# Patient Record
Sex: Male | Born: 1955
Health system: Southern US, Community
[De-identification: ages and names within clinical notes are randomized; demographics above are authoritative.]

## PROBLEM LIST (undated history)

## (undated) DIAGNOSIS — R109 Unspecified abdominal pain: Secondary | ICD-10-CM

## (undated) DIAGNOSIS — I679 Cerebrovascular disease, unspecified: Secondary | ICD-10-CM

## (undated) DIAGNOSIS — T7840XA Allergy, unspecified, initial encounter: Secondary | ICD-10-CM

## (undated) DIAGNOSIS — I639 Cerebral infarction, unspecified: Secondary | ICD-10-CM

## (undated) DIAGNOSIS — D239 Other benign neoplasm of skin, unspecified: Secondary | ICD-10-CM

## (undated) DIAGNOSIS — K5790 Diverticulosis of intestine, part unspecified, without perforation or abscess without bleeding: Secondary | ICD-10-CM

## (undated) DIAGNOSIS — K5792 Diverticulitis of intestine, part unspecified, without perforation or abscess without bleeding: Secondary | ICD-10-CM

## (undated) DIAGNOSIS — K219 Gastro-esophageal reflux disease without esophagitis: Secondary | ICD-10-CM

## (undated) DIAGNOSIS — R079 Chest pain, unspecified: Secondary | ICD-10-CM

## (undated) DIAGNOSIS — I4891 Unspecified atrial fibrillation: Secondary | ICD-10-CM

## (undated) HISTORY — DX: Chest pain, unspecified: R07.9

## (undated) HISTORY — DX: Cerebrovascular disease, unspecified: I67.9

## (undated) HISTORY — DX: Gastro-esophageal reflux disease without esophagitis: K21.9

## (undated) HISTORY — PX: COLON SURGERY: SHX602

## (undated) HISTORY — DX: Diverticulitis of intestine, part unspecified, without perforation or abscess without bleeding: K57.92

## (undated) HISTORY — DX: Unspecified abdominal pain: R10.9

## (undated) HISTORY — DX: Other benign neoplasm of skin, unspecified: D23.9

## (undated) HISTORY — DX: Cerebral infarction, unspecified: I63.9

## (undated) HISTORY — DX: Allergy, unspecified, initial encounter: T78.40XA

## (undated) HISTORY — DX: Unspecified atrial fibrillation: I48.91

## (undated) HISTORY — DX: Diverticulosis of intestine, part unspecified, without perforation or abscess without bleeding: K57.90

---

## 1999-10-11 ENCOUNTER — Encounter: Payer: Self-pay | Admitting: *Deleted

## 1999-10-11 ENCOUNTER — Observation Stay (HOSPITAL_COMMUNITY): Admission: AD | Admit: 1999-10-11 | Discharge: 1999-10-12 | Payer: Self-pay | Admitting: *Deleted

## 2000-12-27 ENCOUNTER — Encounter: Admission: RE | Admit: 2000-12-27 | Discharge: 2000-12-27 | Payer: Self-pay | Admitting: *Deleted

## 2000-12-27 ENCOUNTER — Encounter: Payer: Self-pay | Admitting: *Deleted

## 2002-03-07 ENCOUNTER — Ambulatory Visit (HOSPITAL_COMMUNITY): Admission: RE | Admit: 2002-03-07 | Discharge: 2002-03-07 | Payer: Self-pay | Admitting: *Deleted

## 2006-01-26 ENCOUNTER — Encounter (INDEPENDENT_AMBULATORY_CARE_PROVIDER_SITE_OTHER): Payer: Self-pay | Admitting: Specialist

## 2006-01-26 ENCOUNTER — Ambulatory Visit (HOSPITAL_COMMUNITY): Admission: RE | Admit: 2006-01-26 | Discharge: 2006-01-26 | Payer: Self-pay | Admitting: Gastroenterology

## 2010-01-08 ENCOUNTER — Ambulatory Visit (HOSPITAL_COMMUNITY): Admission: RE | Admit: 2010-01-08 | Discharge: 2010-01-08 | Payer: Self-pay | Admitting: Emergency Medicine

## 2010-01-08 IMAGING — CT CT ABD-PELV W/ CM
2 of 5 series · 17 of 46 positions shown, 19 images · IV contrast (APPLIED)
Comparison: None.

Addendum Begins

The last sentence in the first paragraph of the findings section
has a a voice recognition error.  This sentence should read as
follows, "No abdominal aortic aneurysm."
The third paragraph in the findings section has a voice recognition
error in the third sentence.  This sentence should read "The
prostate gland is mildly enlarged."
Addendum Ends
CLINICAL DATA: Acute abdominal pain
CT ABDOMEN AND PELVIS WITH CONTRAST
TECHNIQUE: Multidetector CT imaging of the abdomen and pelvis was
performed following the standard protocol during bolus
administration of intravenous contrast.
Contrast: 125 ml [UJ]

[Series 2: abd_pel 5.0 b40f st · axial · 0.73mm/px · z∈[-499,-74]mm · 14 of 96 slices shown, 16 images]
[im 6/96  soft-tissue]
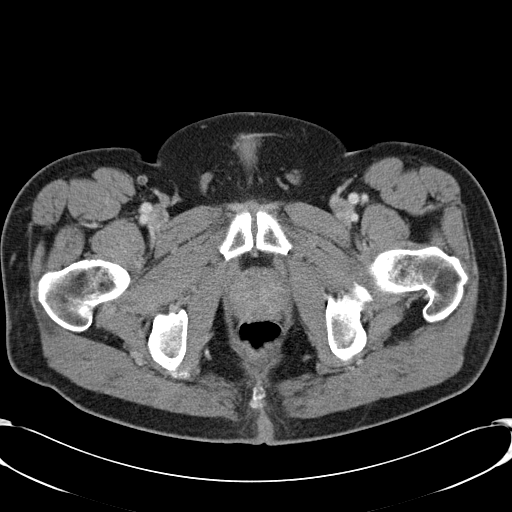
[im 6/96  bone]
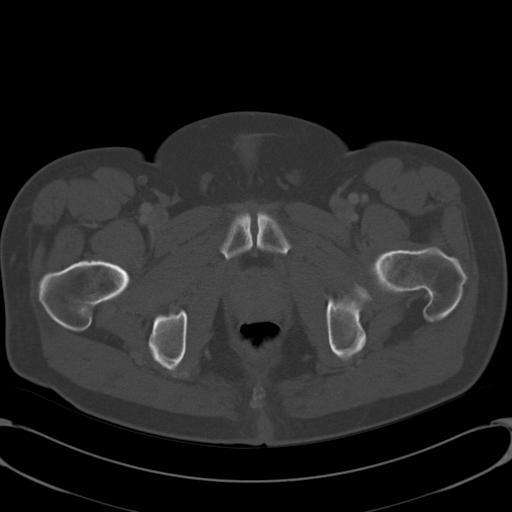
[im 11/96  soft-tissue]
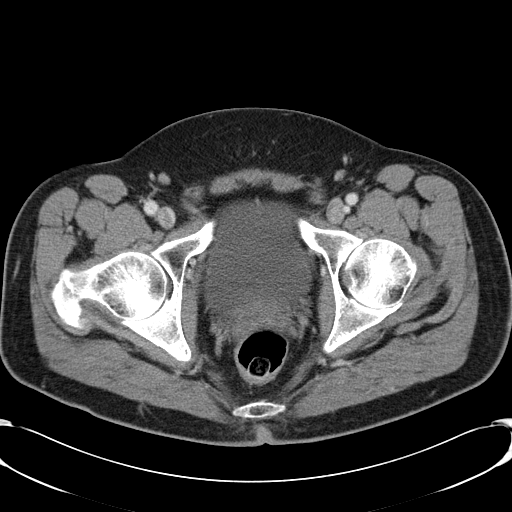
[im 21/96  soft-tissue]
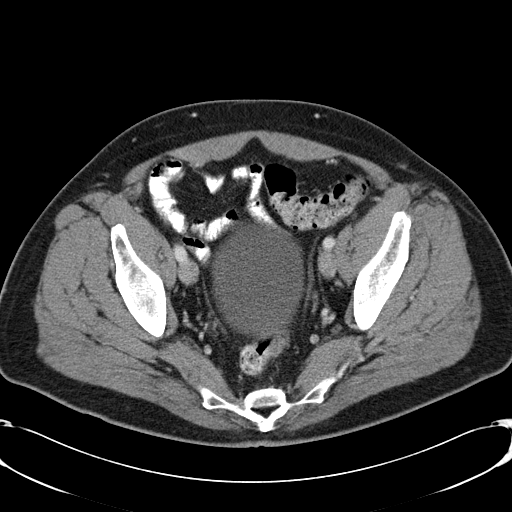
[im 26/96  soft-tissue]
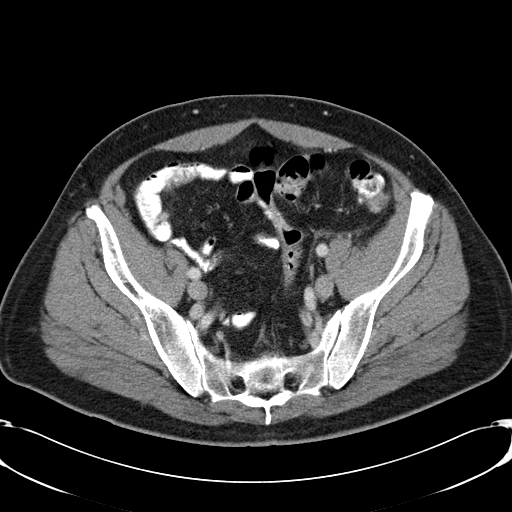
[im 31/96  soft-tissue]
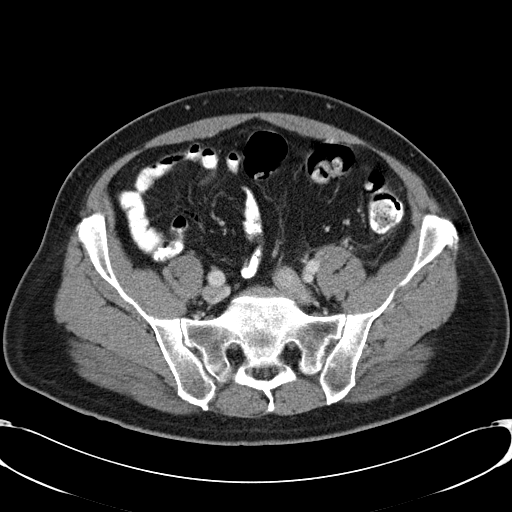
[im 41/96  soft-tissue]
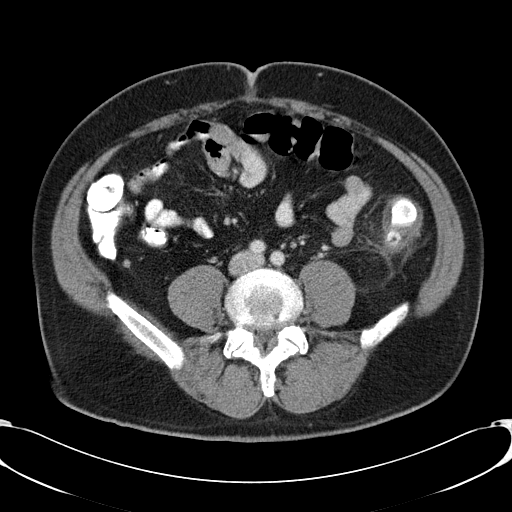
[im 46/96  soft-tissue]
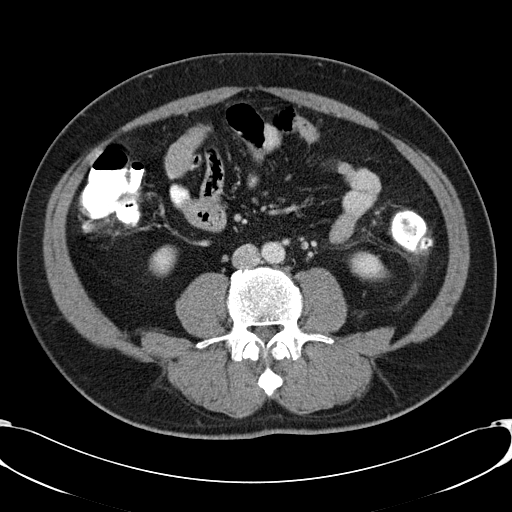
[im 51/96  soft-tissue]
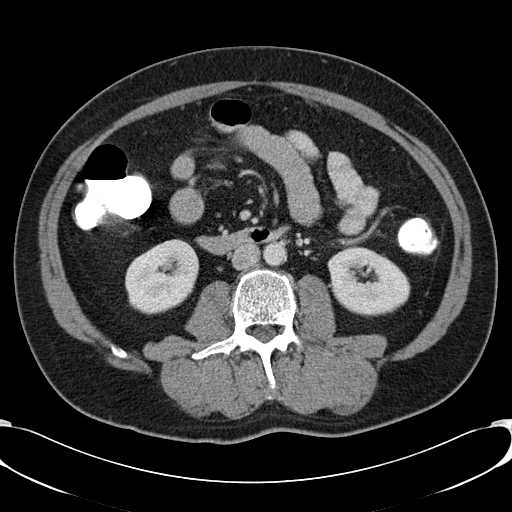
[im 56/96  soft-tissue]
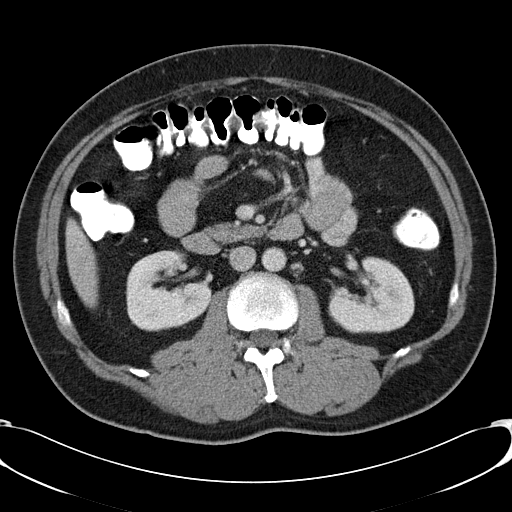
[im 56/96  bone]
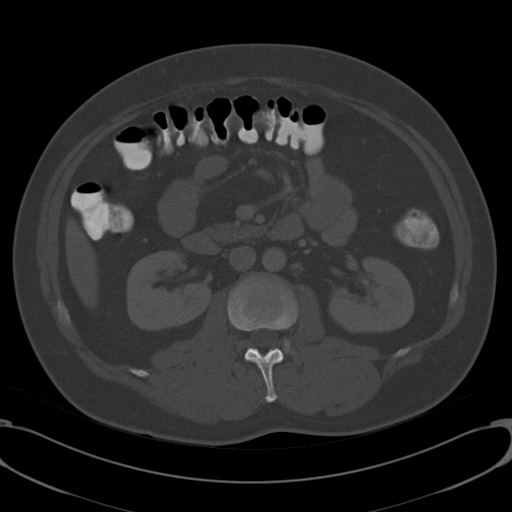
[im 66/96  soft-tissue]
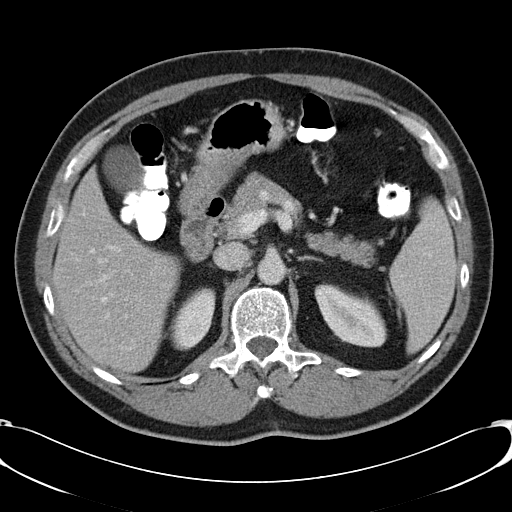
[im 71/96  soft-tissue]
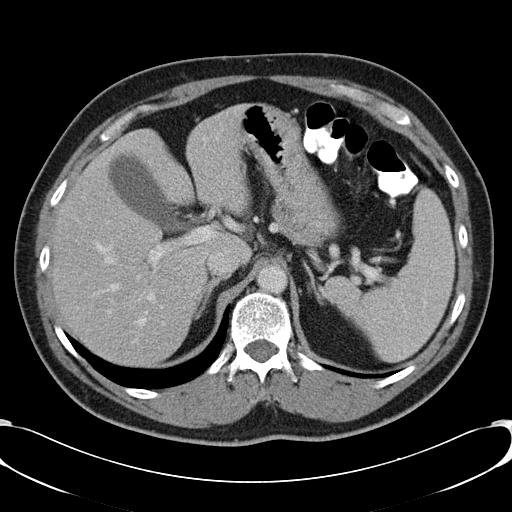
[im 76/96  soft-tissue]
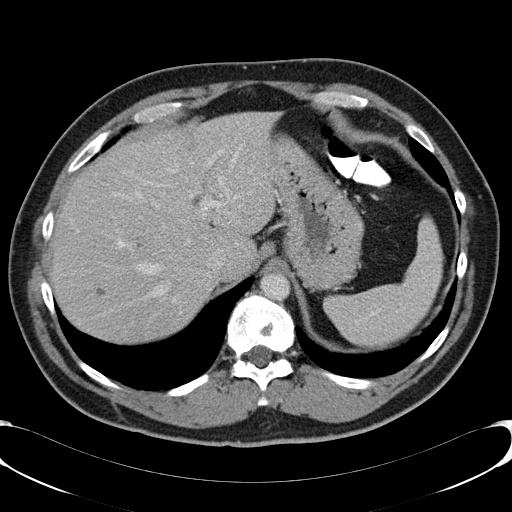
[im 86/96  soft-tissue]
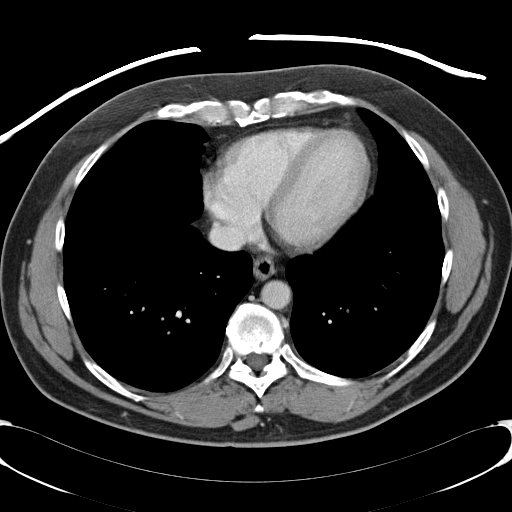
[im 91/96  soft-tissue]
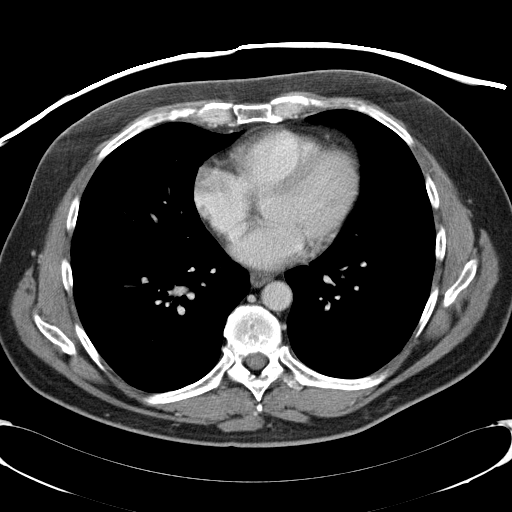

[Series 602: coronal · coronal · 0.96mm/px · 3 of 89 slices shown]
[im 30/89  soft-tissue]
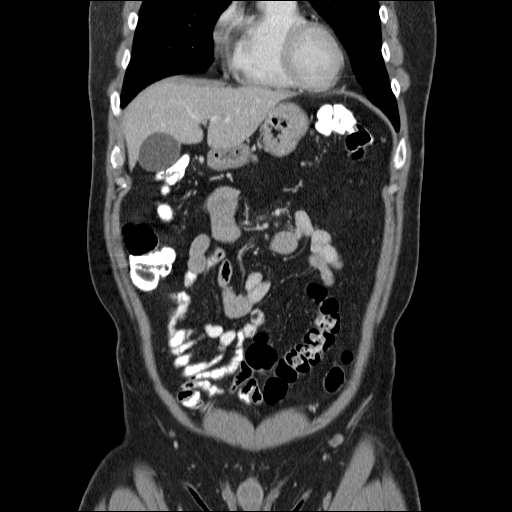
[im 40/89  soft-tissue]
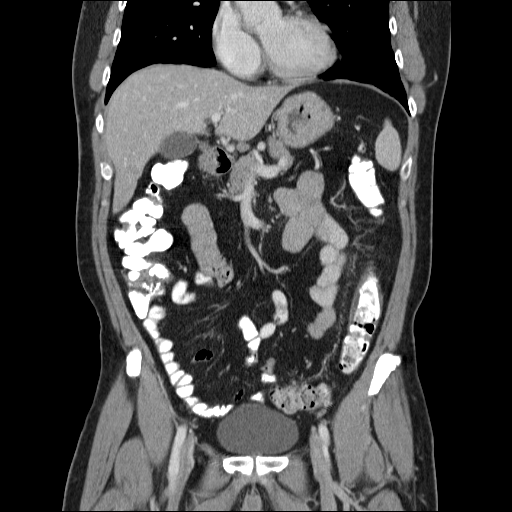
[im 49/89  soft-tissue]
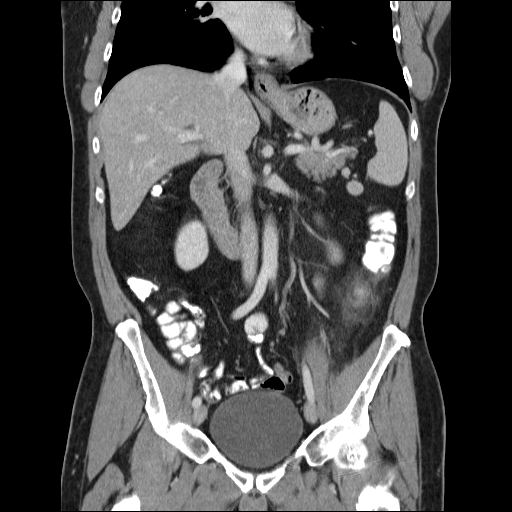

[17 of 46 positions shown; findings below may reference images not displayed]

FINDINGS: Tiny low density lesions in the liver are probably cysts.
Spleen is normal.  The patient has a tiny hiatal hernia.  Duodenum,
pancreas, gallbladder, adrenal glands, and kidneys have normal
imaging features.  The abdominal aortic aneurysm.

Just cranial to the left iliac crest, there is an area of around
the descending colon of pericolonic edema/inflammation associated
with a prominent diverticulum.  Imaging features are compatible
diverticulitis.  No or perforation or pericolonic abscess.

Bladder is distended.  No pelvic free fluid.  The gland is mildly
enlarged.  Scattered diverticuli are noted in the sigmoid colon.
The terminal ileum and the appendix are normal.

Bone windows reveal no worrisome lytic or sclerotic osseous
lesions.
IMPRESSION: Small area of pericolonic edema and asymmetric wall thickening
associated with the descending colon, just above the level of the
left iliac crest.  The features are compatible with diverticulitis.
No evidence for perforation or abscess.

Consider follow-up evaluation of the colon if the patient has not
had previous colorectal cancer screening as neoplasm can present
with similar imaging features.

## 2010-03-18 ENCOUNTER — Encounter: Admission: RE | Admit: 2010-03-18 | Discharge: 2010-03-18 | Payer: Self-pay | Admitting: Emergency Medicine

## 2010-03-18 IMAGING — CT CT ABD-PELV W/ CM
3 of 5 series · 12 of 36 positions shown, 18 images · IV contrast (READICAT/WATER & [ID] OMNI 300)
Comparison: [DATE]

CLINICAL DATA: Follow-up diverticulitis.  Left lower quadrant pain
with right abdominal soreness for 3 weeks.  Diverticulitis [REDACTED]
and [DATE] which improved with antibiotics.

CT ABDOMEN AND PELVIS WITH CONTRAST
TECHNIQUE: Multidetector CT imaging of the abdomen and pelvis was
performed following the standard protocol during bolus
administration of intravenous contrast.
Contrast: 125 ml Omnipaque 300% and oral contrast

[Series 3: routine abdomen · axial · 0.70mm/px · z∈[-353,-38]mm · 7 of 84 slices shown, 12 images]
[im 11/84  soft-tissue]
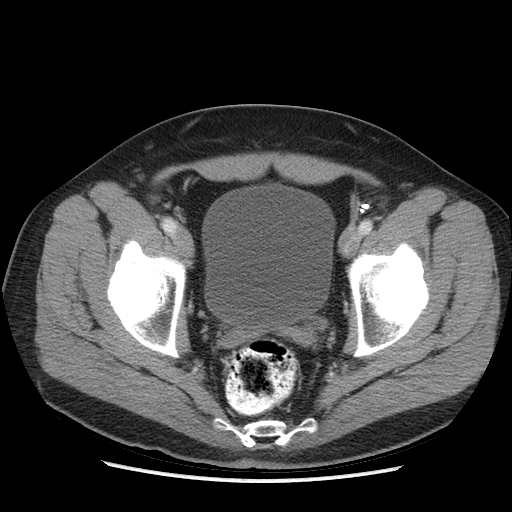
[im 11/84  bone]
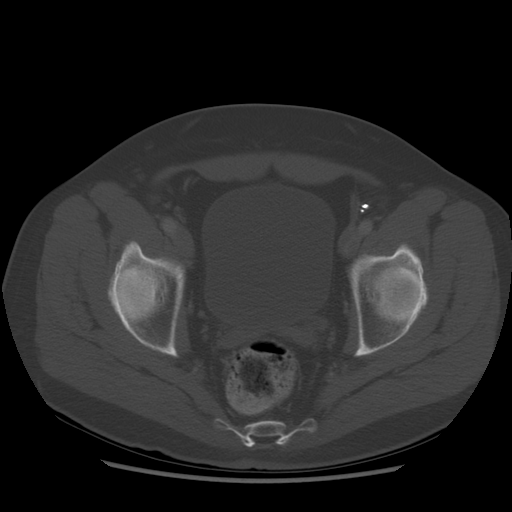
[im 21/84  soft-tissue]
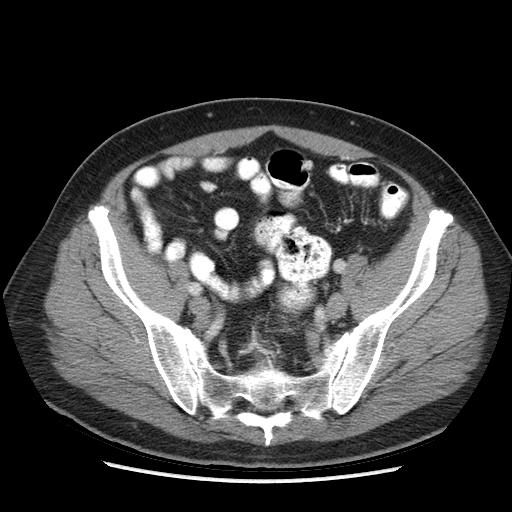
[im 32/84  soft-tissue]
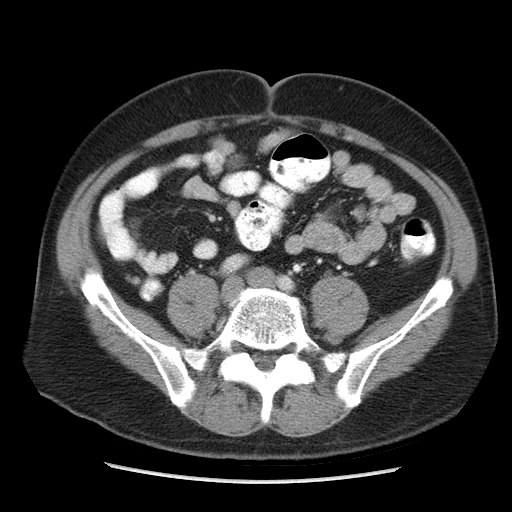
[im 42/84  soft-tissue]
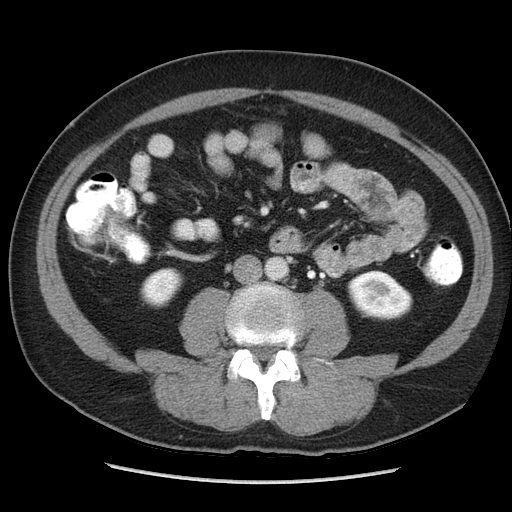
[im 42/84  lung]
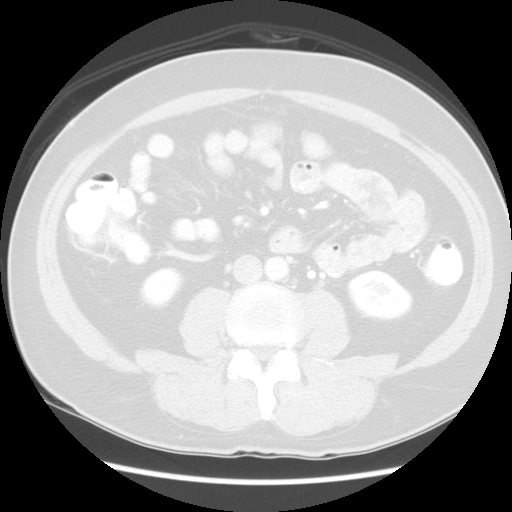
[im 52/84  soft-tissue]
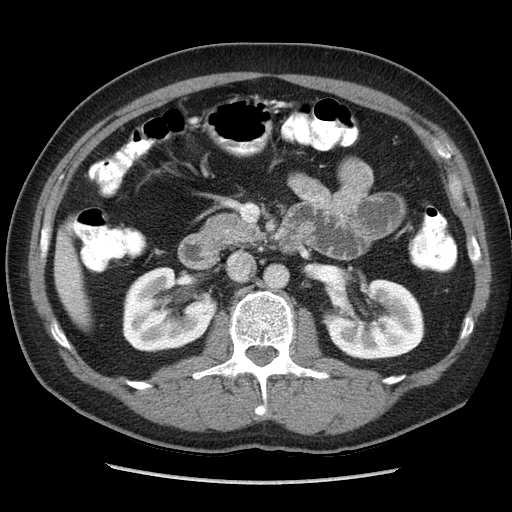
[im 52/84  lung]
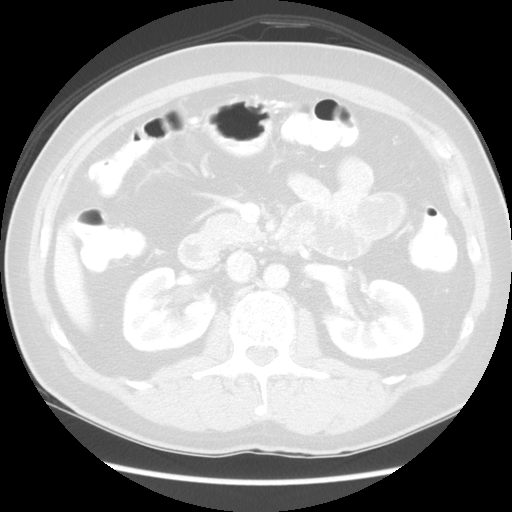
[im 63/84  soft-tissue]
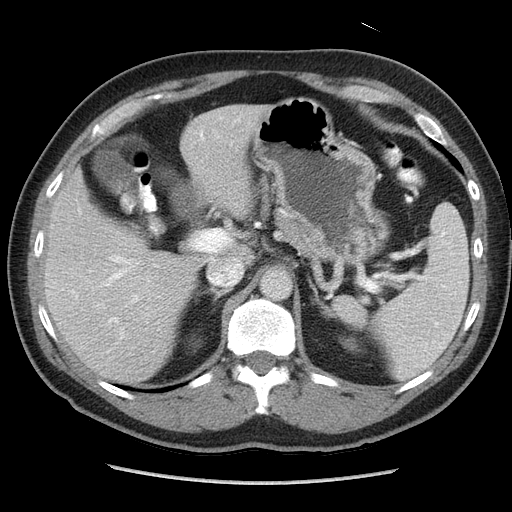
[im 63/84  lung]
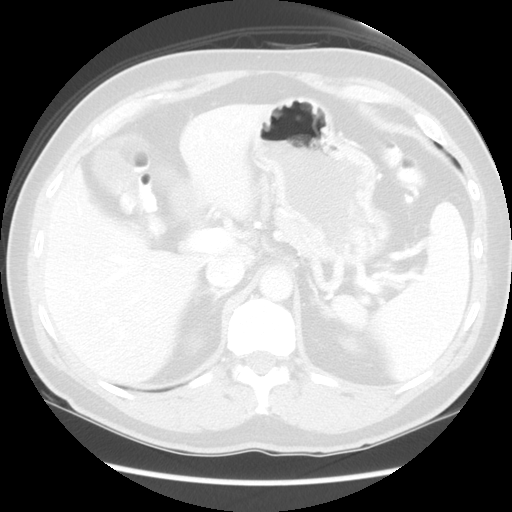
[im 73/84  soft-tissue]
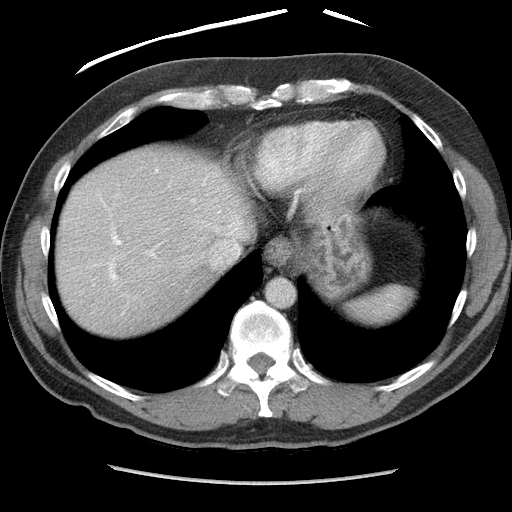
[im 73/84  lung]
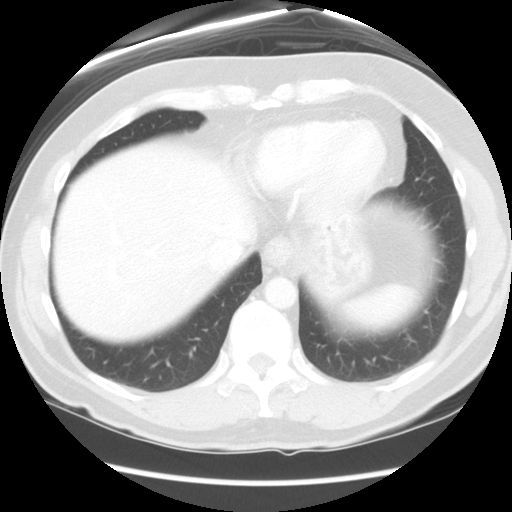

[Series 601: coronal body · coronal · 0.93mm/px · 1 of 123 slices shown, 2 images]
[im 41/123  soft-tissue]
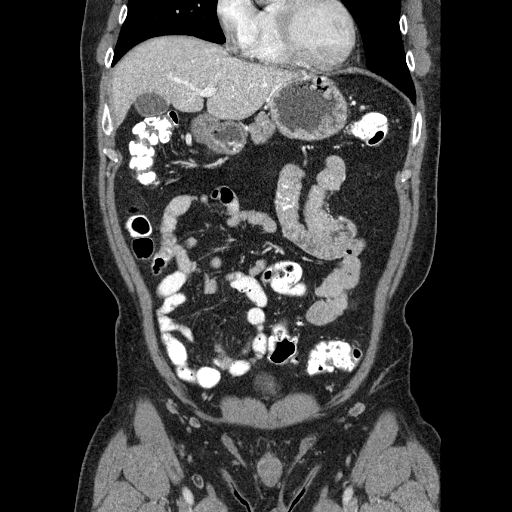
[im 41/123  bone]
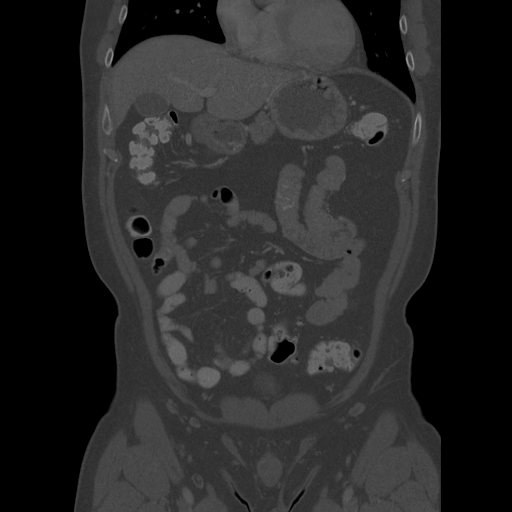

[Series 602: sagittal body · sagittal · 0.93mm/px · 4 of 145 slices shown]
[im 10/145  soft-tissue]
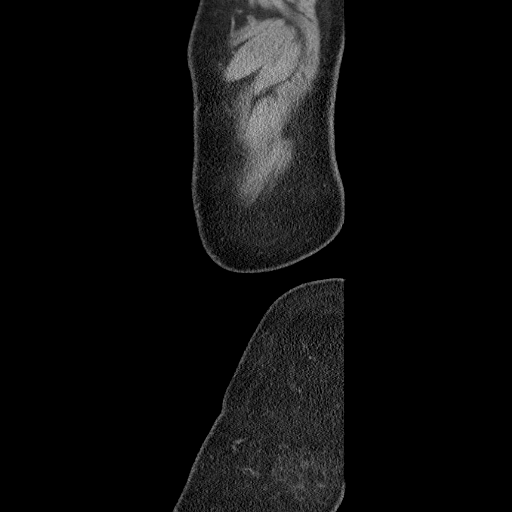
[im 29/145  soft-tissue]
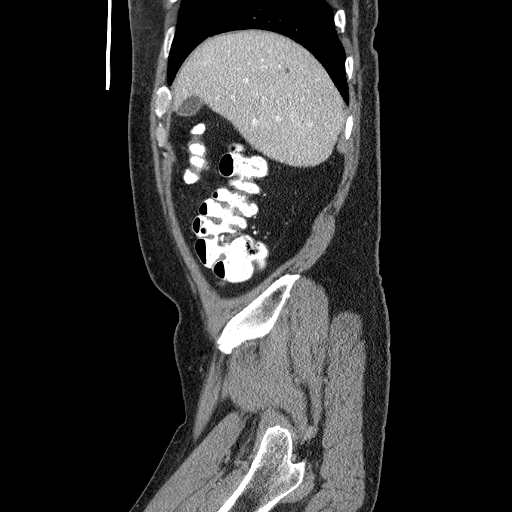
[im 49/145  soft-tissue]
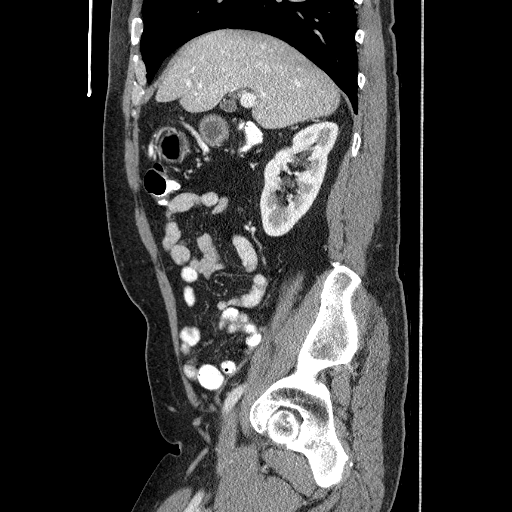
[im 68/145  soft-tissue]
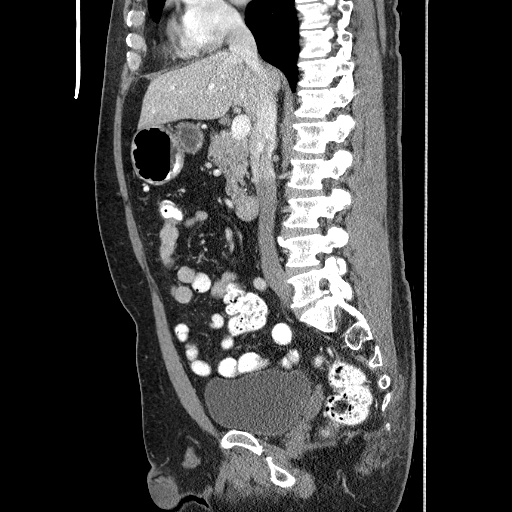

[12 of 36 positions shown; findings below may reference images not displayed]

FINDINGS: The lung bases are clear.

The liver demonstrates two sub centimeter low density foci which
are too small to characterize but likely represents small cysts.
These are stable.  The liver is otherwise unremarkable.  The
spleen, adrenal glands, kidneys, pancreas and gallbladder have an
unremarkable appearance. Incidental note is made of a 1.5 cm and
two 1 cm splenules.

Adequate bowel contrast was achieved. A small sliding right hiatal
hernia is noted and a 1.7 cm duodenal diverticulum is identified
associated with the third portion of the duodenum.  Diverticulosis
of the transverse, descending and rectosigmoid portions of the
colon are again noted.  There has been interval resolution of the
focal colonic wall thickening and pericolonic edema associated with
the prior episode of diverticulitis.  No pericolonic inflammatory
change or thickening is seen today to suggest recurrent or new
focus of diverticulitis.  The ileocecal valve and appendix appear
unremarkable.

No pelvic or intra-abdominal fluid is seen.  A few shotty
mesenteric lymph nodes are seen but no pathologic intra-abdominal
or pelvic adenopathy is noted. The prostate is again noted to be
mildly enlarged.  Pelvic structures are otherwise unremarkable.

Extraabdominal soft tissue planes are within normal limits.  No
focal vascular abnormalities are seen.  No worrisome sclerotic or
lytic bony lesions are noted.
IMPRESSION: No evidence for recurrent or persistent diverticulitis.  Incidental
note is made of a duodenal diverticulum which was collapsed on the
prior exam. Otherwise stable appearance

## 2010-03-29 ENCOUNTER — Ambulatory Visit: Payer: Self-pay | Admitting: Cardiology

## 2010-05-31 HISTORY — PX: PARTIAL COLECTOMY: SHX5273

## 2010-06-10 IMAGING — CR DG CHEST 2V
2 series · 2 of 2 positions shown · non-contrast
Comparison: [HOSPITAL] at [REDACTED] CT
[DATE].

CLINICAL DATA: Preop for partial removal colon due to recurrent
diverticulitis.  No chest complaints.

CHEST - 2 VIEW

[w chest pa]
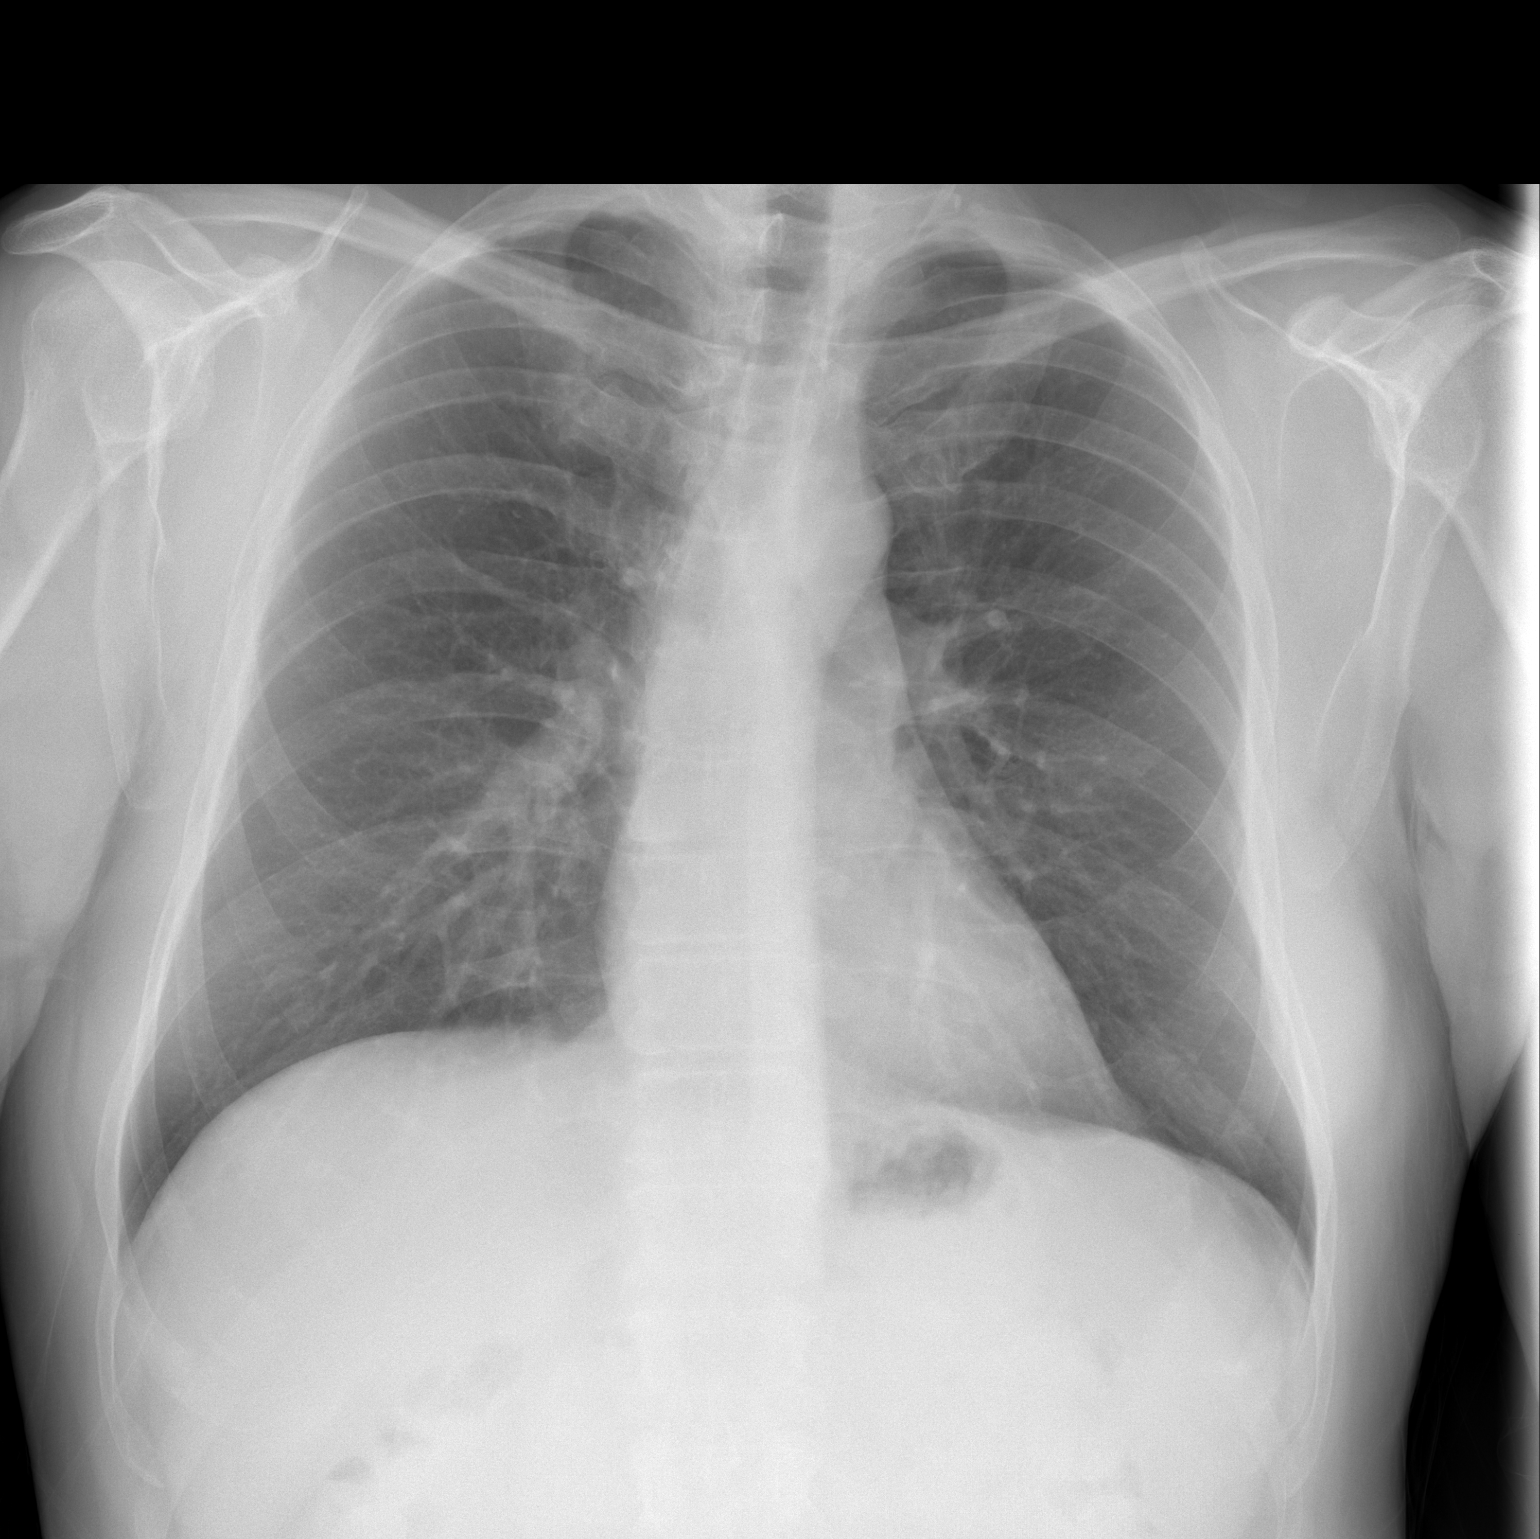

[w chest lat]
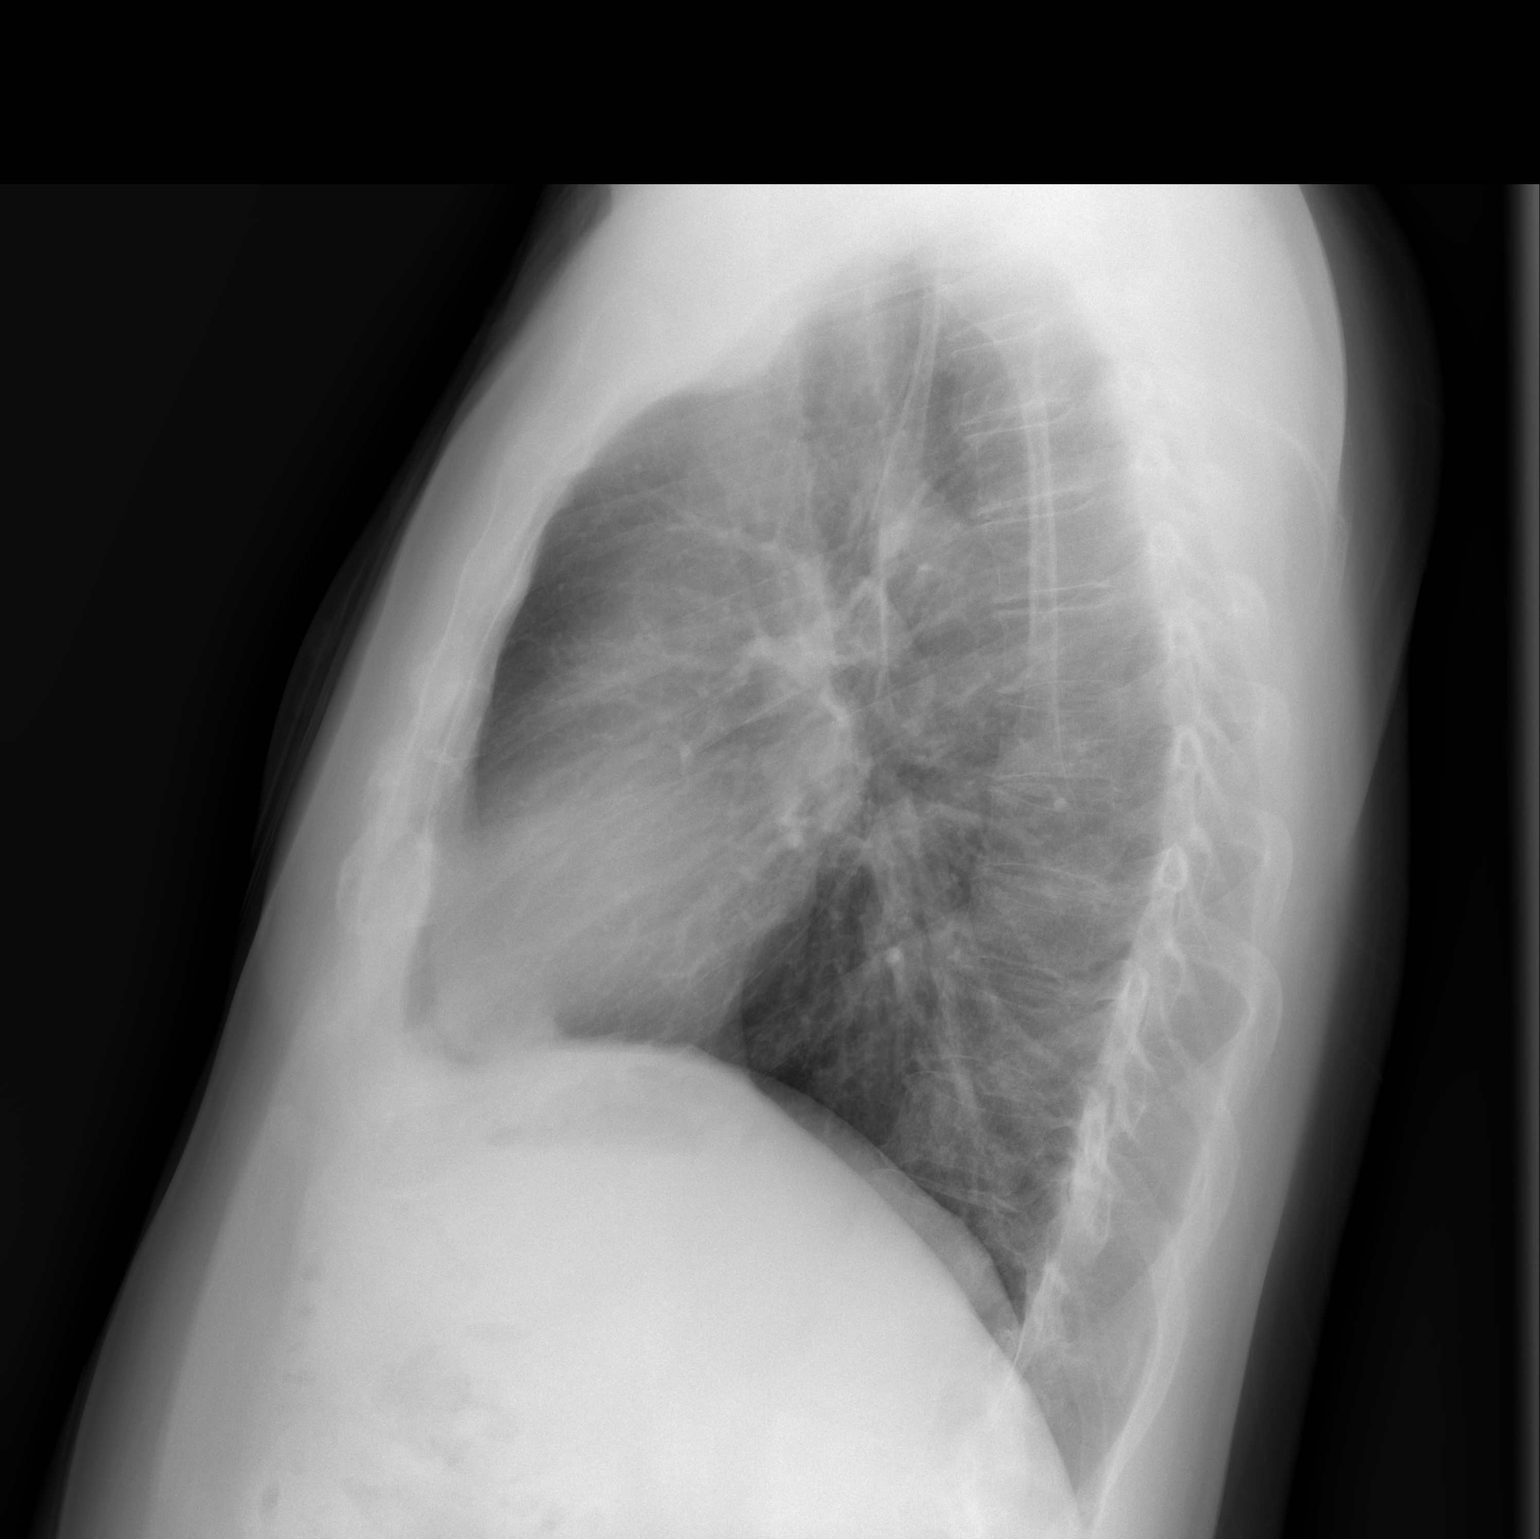

[2 of 2 positions shown; findings below may reference images not displayed]

FINDINGS: Lungs are clear.  The heart size normal.  Mediastinum,
hila, pleura and and osseous structures appear normal for age.
IMPRESSION: Normal.

## 2010-06-16 ENCOUNTER — Encounter (INDEPENDENT_AMBULATORY_CARE_PROVIDER_SITE_OTHER): Payer: Self-pay | Admitting: General Surgery

## 2010-06-16 ENCOUNTER — Inpatient Hospital Stay (HOSPITAL_COMMUNITY): Admission: RE | Admit: 2010-06-16 | Discharge: 2010-06-20 | Payer: Self-pay | Admitting: General Surgery

## 2010-06-16 IMAGING — CR DG CHEST 1V PORT
1 series · 1 of 1 positions shown · non-contrast
Comparison: [DATE]

CLINICAL DATA: Shortness of breath.  Recurrent diverticulitis.

PORTABLE CHEST - 1 VIEW

[series [date]]
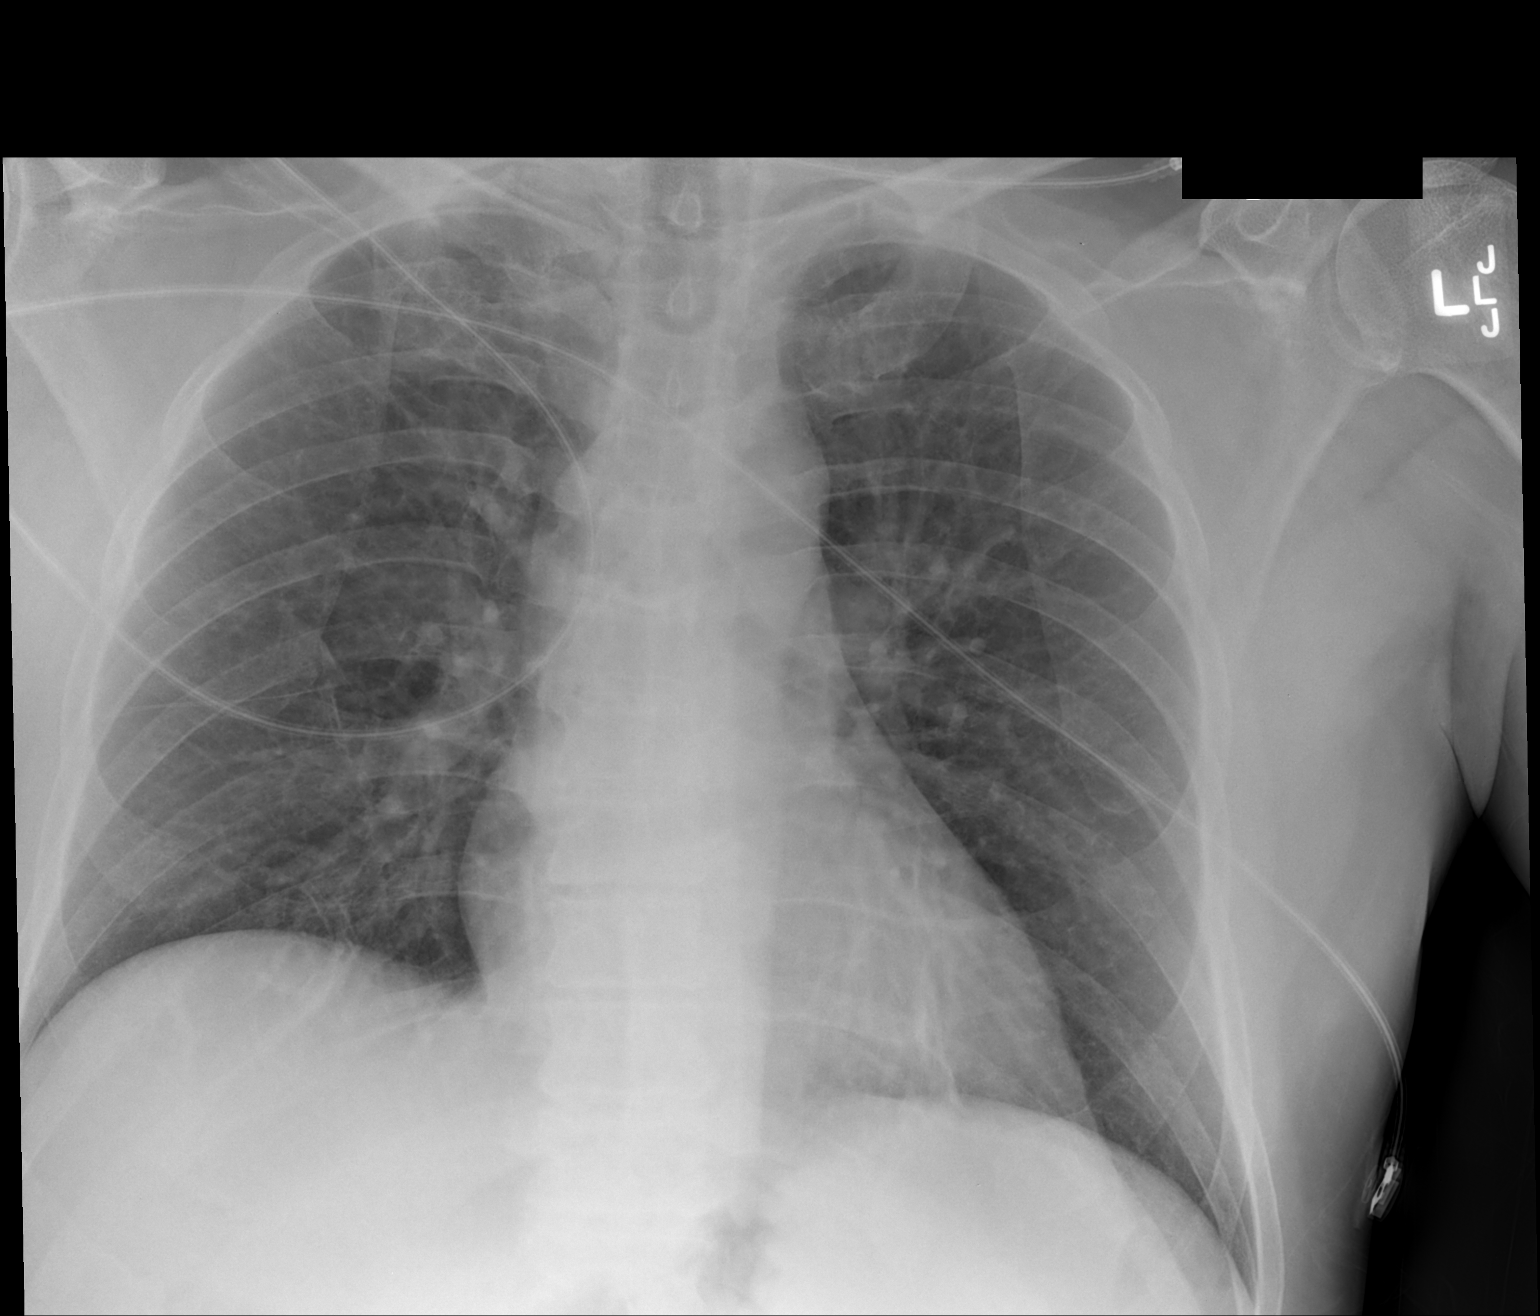

[1 of 1 positions shown; findings below may reference images not displayed]

FINDINGS: Heart size is normal.  Vascularity is slightly prominent.
There is minimal linear atelectasis at the left lung base.  No
pneumothorax.
IMPRESSION: Minimal vascular prominence with slight atelectasis at the left
base.

## 2010-09-14 ENCOUNTER — Other Ambulatory Visit: Payer: Self-pay | Admitting: Dermatology

## 2010-09-14 DIAGNOSIS — C4491 Basal cell carcinoma of skin, unspecified: Secondary | ICD-10-CM

## 2010-09-14 HISTORY — DX: Basal cell carcinoma of skin, unspecified: C44.91

## 2010-10-11 LAB — BASIC METABOLIC PANEL
BUN: 15 mg/dL (ref 6–23)
BUN: 7 mg/dL (ref 6–23)
CO2: 25 mEq/L (ref 19–32)
CO2: 31 mEq/L (ref 19–32)
Calcium: 10 mg/dL (ref 8.4–10.5)
Calcium: 8.4 mg/dL (ref 8.4–10.5)
Chloride: 102 mEq/L (ref 96–112)
Chloride: 109 mEq/L (ref 96–112)
Creatinine, Ser: 0.86 mg/dL (ref 0.4–1.5)
Creatinine, Ser: 1.07 mg/dL (ref 0.4–1.5)
GFR calc Af Amer: >60 mL/min (ref >60)
GFR calc Af Amer: >60 mL/min (ref >60)
GFR calc non Af Amer: >60 mL/min (ref >60)
GFR calc non Af Amer: >60 mL/min (ref >60)
Glucose, Bld: 106 mg/dL — ABNORMAL HIGH (ref 70–99)
Glucose, Bld: 57 mg/dL — ABNORMAL LOW (ref 70–99)
Potassium: 3.6 mEq/L (ref 3.5–5.1)
Potassium: 4.5 mEq/L (ref 3.5–5.1)
Sodium: 137 mEq/L (ref 135–145)
Sodium: 141 mEq/L (ref 135–145)

## 2010-10-11 LAB — DIFFERENTIAL
Basophils Absolute: 0 10*3/uL (ref 0.0–0.1)
Basophils Relative: 0 % (ref 0–1)
Eosinophils Absolute: 0 10*3/uL (ref 0.0–0.7)
Eosinophils Relative: 1 % (ref 0–5)
Lymphocytes Relative: 31 % (ref 12–46)
Lymphs Abs: 1.8 10*3/uL (ref 0.7–4.0)
Monocytes Absolute: 0.6 10*3/uL (ref 0.1–1.0)
Monocytes Relative: 10 % (ref 3–12)
Neutro Abs: 3.4 10*3/uL (ref 1.7–7.7)
Neutrophils Relative %: 58 % (ref 43–77)

## 2010-10-11 LAB — TYPE AND SCREEN
ABO/RH(D): O NEG
Antibody Screen: NEGATIVE

## 2010-10-11 LAB — CBC
HCT: 40.8 % (ref 39.0–52.0)
HCT: 47.8 % (ref 39.0–52.0)
Hemoglobin: 13.9 g/dL (ref 13.0–17.0)
Hemoglobin: 16.3 g/dL (ref 13.0–17.0)
MCH: 28.9 pg (ref 26.0–34.0)
MCH: 28.9 pg (ref 26.0–34.0)
MCHC: 34.1 g/dL (ref 30.0–36.0)
MCHC: 34.2 g/dL (ref 30.0–36.0)
MCV: 84.4 fL (ref 78.0–100.0)
MCV: 84.6 fL (ref 78.0–100.0)
Platelets: 145 10*3/uL — ABNORMAL LOW (ref 150–400)
Platelets: 182 10*3/uL (ref 150–400)
RBC: 4.82 MIL/uL (ref 4.22–5.81)
RBC: 5.66 MIL/uL (ref 4.22–5.81)
RDW: 13.4 % (ref 11.5–15.5)
RDW: 13.4 % (ref 11.5–15.5)
WBC: 5.9 10*3/uL (ref 4.0–10.5)
WBC: 8.6 10*3/uL (ref 4.0–10.5)

## 2010-10-11 LAB — SURGICAL PCR SCREEN
MRSA, PCR: NEGATIVE
Staphylococcus aureus: NEGATIVE

## 2010-10-11 LAB — ABO/RH: ABO/RH(D): O NEG

## 2010-10-17 LAB — CREATININE, SERUM
Creatinine, Ser: 1.09 mg/dL (ref 0.4–1.5)
GFR calc Af Amer: >60 mL/min (ref >60)
GFR calc non Af Amer: >60 mL/min (ref >60)

## 2010-10-17 LAB — BUN: BUN: 11 mg/dL (ref 6–23)

## 2010-12-16 NOTE — Op Note (Signed)
Nicholas West, RION NO.:  [id number]   MEDICAL RECORD NO.:  [id number]          PATIENT TYPE:  AMB   LOCATION:  ENDO                         FACILITY:  [facility]   PHYSICIAN:  Shirley Friar, MDDATE OF BIRTH:  03/29/1956   DATE OF PROCEDURE:  01/26/2006  DATE OF DISCHARGE:                                 OPERATIVE REPORT   INDICATION:  Iron deficiency anemia, follow-up of stomach polyps.   MEDICATIONS:  Fentanyl 80 mcg IV, Versed 10 mg IV, 7 mL of epinephrine in  1:10,000 injection.   FINDINGS:  Endoscope was inserted through the oropharynx, esophagus was  intubated which was normal in its entirety.  Upon immediately entering the  stomach, multiple gastric polyps were noted ranging in size from 3 mm up to  3 cm.  Endoscope was passed down into the duodenal bulb, second portion of  duodenum which were both normal.  Endoscope was withdrawn back into the  stomach and using snare cautery, the largest of these polyps were removed  and retrieved with a Roth net.  Snare cautery was used to remove these  polyps and approximately six were taken out.  During removal of one of the  polyps led to bleeding at a post polypectomy site and 7 mL of epinephrine  1:10,000 was injected to stop the bleeding.   ASSESSMENT:  Multiple gastric polyps.  Previous polyps removed were benign  fundic gland polyps.  The patient will need evaluation for hypergastrinemia  as a possible source of these polyps and may need further workup as well.   PLAN:  1.  Evaluate for hypergastrinemia with serum gastrin level and possible      gastric acid analysis.  2.  Follow-up on path.  3.  No aspirin products for 14 days.      Shirley Friar, MD  Electronically Signed     VCS/MEDQ  D:  01/26/2006  T:  01/26/2006  Job:  [id number]   cc:   Brett Canales A. Cleta Alberts, M.D.  Fax: [phone]

## 2010-12-16 NOTE — Procedures (Signed)
[place]  Patient:    Nicholas West, Nicholas West                     MRN: [id number] Proc. Date: 10/11/99 Adm. Date:  [id number] Attending:  Mingo Amber CC:         Miguel Aschoff, M.D.                           Procedure Report  PROCEDURE:  Endoscopic retrograde cholangiopancreatography.  INDICATIONS:  A 55 year old male with worsening obstructive liver function tests and intermittent abdominal pain radiating to his back with a normal CT-scan and an ultrasound that does not reveal any stones, but suggests dilated bile duct.  PREPARATION:  He was n.p.o. since midnight.  PREPROCEDURE SEDATION:  He received 150 mg of Demerol and 15 mg of Versed titrated to keep him sedate.  In addition, he was premedicated with 500 mg of Levaquin IV. He was on 2 L of nasal cannula O2 and his throat was anesthetized with Hurricaine spray.  PROCEDURE:  The Olympus video duodenoscope was inserted via the mouth and advanced easily into the stomach.  The pylorus was identified and the scope was passed into the duodenum.  On pullback maneuver, the ampulla was visualized and quite easily cannulated with Tritome catheter.  Injection into the common bile duct revealed  probable distal common bile duct small stone.  The guidewire was advanced into he right hepatic duct and a moderate sphincterotomy was performed with good bile drainage.  One small stone was seen to come out of the ampullary area following the sphincterotomy.  The stone passed spontaneously.  A catheter exchange was performed with a 12 mm balloon catheter, which was advanced to the bifurcation of the ducts and inflated.  As dye was injected, the balloon was withdrawn.  Three sweeps of the balloon were taken and two additional stones were removed from the distal common bile duct.  All were quite small.  The radiologist was in attendance and agreed  that the common bile duct had been cleared.  There  were no additional filling defects seen.  The patient tolerated the procedure well.  Pulse, blood pressure and oximetry were stable throughout.  There was good bile flow at the conclusion of the procedure.  Follow-up film with the scope removed revealed good bile drainage and no filling defects.  IMPRESSION:  Successful ERCP, sphincterotomy and removal of three common bile duct stones.  PLAN:  Patient will be admitted to the hospital for overnight observation. Please see the orders.  Consideration should be given to cholecystectomy in this patient in the near future. DD:  10/11/99 TD:  10/11/99 Job: [id number]

## 2011-10-24 ENCOUNTER — Ambulatory Visit: Payer: Self-pay | Admitting: Emergency Medicine

## 2011-10-31 ENCOUNTER — Encounter: Payer: Self-pay | Admitting: Emergency Medicine

## 2011-10-31 ENCOUNTER — Ambulatory Visit (INDEPENDENT_AMBULATORY_CARE_PROVIDER_SITE_OTHER): Payer: BC Managed Care – PPO | Admitting: Emergency Medicine

## 2011-10-31 VITALS — BP 118/77 | HR 78 | Temp 98.1°F | Resp 16 | Ht 71.5 in | Wt 192.2 lb

## 2011-10-31 DIAGNOSIS — N529 Male erectile dysfunction, unspecified: Secondary | ICD-10-CM

## 2011-10-31 DIAGNOSIS — E236 Other disorders of pituitary gland: Secondary | ICD-10-CM

## 2011-10-31 DIAGNOSIS — E291 Testicular hypofunction: Secondary | ICD-10-CM

## 2011-10-31 LAB — PSA: PSA: 0.98 ng/mL (ref <=4.00)

## 2011-10-31 NOTE — Progress Notes (Signed)
  Subjective:    Patient ID: Nicholas West, male    DOB: 05-22-1956, 56 y.o.   MRN: [id number]  HPI issue here to followup his testosterone usage. He is here for followup testosterone levels and PSA. He does feel that testosterone helps with fatigue. He continues to use Cialis for ED.    Review of Systems otherwise he has no complaints at the present time     Objective:   Physical Exam not repeated today        Assessment & Plan:  Assessment hypogonadism with ED. We'll continue current dose of testosterone and Cialis for now. We'll check levels of free and total testosterone as well as PSA cilia: Followup on his testosterone replacement

## 2011-11-01 LAB — TESTOSTERONE, FREE, TOTAL, SHBG
Sex Hormone Binding: 20 nmol/L (ref 13–71)
Testosterone, Free: 63.1 pg/mL (ref 47.0–244.0)
Testosterone-% Free: 2.5 % (ref 1.6–2.9)
Testosterone: 251.5 ng/dL — ABNORMAL LOW (ref 300–890)

## 2011-11-22 ENCOUNTER — Other Ambulatory Visit: Payer: Self-pay | Admitting: Emergency Medicine

## 2011-12-26 ENCOUNTER — Ambulatory Visit: Payer: Self-pay | Admitting: Emergency Medicine

## 2012-03-05 ENCOUNTER — Ambulatory Visit (INDEPENDENT_AMBULATORY_CARE_PROVIDER_SITE_OTHER): Payer: BC Managed Care – PPO | Admitting: Emergency Medicine

## 2012-03-05 ENCOUNTER — Telehealth: Payer: Self-pay

## 2012-03-05 ENCOUNTER — Other Ambulatory Visit: Payer: Self-pay | Admitting: Emergency Medicine

## 2012-03-05 VITALS — BP 130/84 | HR 66 | Temp 98.2°F | Resp 16 | Ht 71.5 in | Wt 193.4 lb

## 2012-03-05 DIAGNOSIS — E236 Other disorders of pituitary gland: Secondary | ICD-10-CM

## 2012-03-05 DIAGNOSIS — N529 Male erectile dysfunction, unspecified: Secondary | ICD-10-CM

## 2012-03-05 DIAGNOSIS — E291 Testicular hypofunction: Secondary | ICD-10-CM | POA: Insufficient documentation

## 2012-03-05 DIAGNOSIS — Z0000 Encounter for general adult medical examination without abnormal findings: Secondary | ICD-10-CM

## 2012-03-05 DIAGNOSIS — R5381 Other malaise: Secondary | ICD-10-CM

## 2012-03-05 DIAGNOSIS — R079 Chest pain, unspecified: Secondary | ICD-10-CM

## 2012-03-05 HISTORY — DX: Testicular hypofunction: E29.1

## 2012-03-05 HISTORY — DX: Male erectile dysfunction, unspecified: N52.9

## 2012-03-05 LAB — CBC WITH DIFFERENTIAL/PLATELET
Basophils Absolute: 0 10*3/uL (ref 0.0–0.1)
Basophils Relative: 0 % (ref 0–1)
Eosinophils Absolute: 0.1 10*3/uL (ref 0.0–0.7)
Eosinophils Relative: 2 % (ref 0–5)
HCT: 45.7 % (ref 39.0–52.0)
Hemoglobin: 16.1 g/dL (ref 13.0–17.0)
Lymphocytes Relative: 29 % (ref 12–46)
Lymphs Abs: 1.4 10*3/uL (ref 0.7–4.0)
MCH: 28.5 pg (ref 26.0–34.0)
MCHC: 35.2 g/dL (ref 30.0–36.0)
MCV: 80.9 fL (ref 78.0–100.0)
Monocytes Absolute: 0.4 10*3/uL (ref 0.1–1.0)
Monocytes Relative: 9 % (ref 3–12)
Neutro Abs: 2.8 10*3/uL (ref 1.7–7.7)
Neutrophils Relative %: 60 % (ref 43–77)
Platelets: 156 10*3/uL (ref 150–400)
RBC: 5.65 MIL/uL (ref 4.22–5.81)
RDW: 13.7 % (ref 11.5–15.5)
WBC: 4.7 10*3/uL (ref 4.0–10.5)

## 2012-03-05 LAB — POCT URINALYSIS DIPSTICK
Bilirubin, UA: NEGATIVE
Blood, UA: NEGATIVE
Glucose, UA: NEGATIVE
Ketones, UA: NEGATIVE
Leukocytes, UA: NEGATIVE
Nitrite, UA: NEGATIVE
Protein, UA: NEGATIVE
Spec Grav, UA: 1.025
Urobilinogen, UA: 0.2
pH, UA: 7

## 2012-03-05 LAB — POCT UA - MICROSCOPIC ONLY
Bacteria, U Microscopic: NEGATIVE
Casts, Ur, LPF, POC: NEGATIVE
Crystals, Ur, HPF, POC: NEGATIVE
Yeast, UA: NEGATIVE

## 2012-03-05 LAB — IFOBT (OCCULT BLOOD): IFOBT: NEGATIVE

## 2012-03-05 MED ORDER — TESTOSTERONE 30 MG/ACT TD SOLN: 60.0000 mg | TRANSDERMAL | Status: DC

## 2012-03-05 MED ORDER — OMEPRAZOLE 20 MG PO CPDR: 20.0000 mg | DELAYED_RELEASE_CAPSULE | Freq: Every day | ORAL | Status: AC

## 2012-03-05 MED ORDER — SILDENAFIL CITRATE 100 MG PO TABS: 100.0000 mg | ORAL_TABLET | Freq: Every day | ORAL | Status: DC | PRN

## 2012-03-05 NOTE — Telephone Encounter (Signed)
Pt was seen by dr Cleta Alberts today. Pt wants to ask the doctor questions about preventative care for hep C

## 2012-03-05 NOTE — Progress Notes (Signed)
@[email or web address] @  Patient ID: Nicholas West MRN: [id number], DOB: Oct 17, 1955 56 y.o. Date of Encounter: 03/05/2012, 8:15 AM  Primary Physician: No primary provider on file.  Chief Complaint: Physical (CPE)  HPI: 56 y.o. y/o male with history noted below here for CPE.  Doing well. No issues/complaints.  Review of Systems:  Consitutional: No fever, chills, About one time per week patient experiences fatigue . night sweats, lymphadenopathy, or weight changes. Eyes: No visual changes, eye redness, or discharge. ENT/Mouth: Ears: No otalgia, tinnitus, hearing loss, discharge. Nose: No congestion, rhinorrhea, sinus pain, or epistaxis. Throat: No sore throat, post nasal drip, or teeth pain. Cardiovascular: No CP, palpitations, diaphoresis, DOE, edema, orthopnea, PND. Respiratory: No cough, hemoptysis, SOB, or wheezing. Gastrointestinal: No anorexia, dysphagia, reflux, pain, nausea, vomiting, hematemesis, diarrhea, constipation, BRBPR, or melena.  GU patient does have issues with ED and hypogonadism. He's currently on ED medications and  Testosterone replacement. Musculoskeletal: No decreased ROM, myalgias, stiffness, joint swelling, or weakness. Skin: No rash, erythema, lesion changes, pain, warmth, jaundice, or pruritis. Neurological: No headache, dizziness, syncope, seizures, tremors, memory loss, coordination problems, or paresthesias. Psychological: No anxiety, depression, hallucinations, SI/HI. Endocrine: No fatigue, polydipsia, polyphagia, polyuria, or known diabetes. All other systems were reviewed and are otherwise negative.  No past medical history on file.   No past surgical history on file.  Home Meds:  Prior to Admission medications   Medication Sig Start Date End Date Taking? Authorizing Provider  AXIRON 30 MG/ACT SOLN APPLY TWO PUMPS TOPICALLY PER DAY AS DIRECTED. 11/22/11  Yes Ryan M Dunn, PA-C  omeprazole (PRILOSEC) 20 MG capsule Take 20 mg by mouth daily.   Yes Historical  Provider, MD  sildenafil (VIAGRA) 100 MG tablet Take 100 mg by mouth daily as needed.   Yes Historical Provider, MD  tacrolimus (PROTOPIC) 0.1 % ointment Apply topically 2 (two) times daily.   Yes Historical Provider, MD    Allergies:  Allergies  Allergen Reactions  . Penicillins     rash    History   Social History  . Marital Status: Married    Spouse Name: N/A    Number of Children: N/A  . Years of Education: N/A   Occupational History  . Not on file.   Social History Main Topics  . Smoking status: Never Smoker   . Smokeless tobacco: Not on file  . Alcohol Use: No  . Drug Use: No  . Sexually Active: Not on file   Other Topics Concern  . Not on file   Social History Narrative  . No narrative on file    No family history on file.  Physical Exam: Blood pressure 130/84, pulse 66, temperature 98.2 F (36.8 C), temperature source Oral, resp. rate 16, height 5' 11.5" (1.816 m), weight 193 lb 6.4 oz (87.726 kg), SpO2 96.00%.  General: Well developed, well nourished, in no acute distress. HEENT: Normocephalic, atraumatic. Conjunctiva pink, sclera non-icteric. Pupils 2 mm constricting to 1 mm, round, regular, and equally reactive to light and accomodation. EOMI. Internal auditory canal clear. TMs with good cone of light and without pathology. Nasal mucosa pink. Nares are without discharge. No sinus tenderness. Oral mucosa pink. Dentition normal. Pharynx without exudate.   Neck: Supple. Trachea midline. No thyromegaly. Full ROM. No lymphadenopathy. Lungs: Clear to auscultation bilaterally without wheezes, rales, or rhonchi. Breathing is of normal effort and unlabored. Cardiovascular: RRR with S1 S2. No murmurs, rubs, or gallops appreciated. Distal pulses 2+ symmetrically. No carotid or abdominal bruits. Abdomen: Soft, non-tender,  non-distended with normoactive bowel sounds. No hepatosplenomegaly or masses. No rebound/guarding. No CVA tenderness. Without hernias.  Rectal: No  external hemorrhoids or fissures. Rectal vault without masses.   Genitourinary:   circumcised male. No penile lesions. Testes descended bilaterally, and smooth without tenderness or masses. Testicles are very small nontender.  Musculoskeletal: Full range of motion and 5/5 strength throughout. Without swelling, atrophy, tenderness, crepitus, or warmth. Extremities without clubbing, cyanosis, or edema. Calves supple. Skin: Warm and moist without erythema, ecchymosis, wounds, or rash there is a sebaceous cyst on the right side abdomen which was compressed and a large amount of sebaceous material was removed. Neuro: A+Ox3. CN II-XII grossly intact. Moves all extremities spontaneously. Full sensation throughout. Normal gait. DTR 2+ throughout upper and lower extremities. Finger to nose intact. Psych:  Responds to questions appropriately with a normal affect.   Studies: CBC, CMET, Lipid, PSA, TSH, Vitamin D free and total testosterone and urine were all performed as well as a hemosure    UA:    Assessment/Plan:  56 y.o. y/o   white male here for CPE His visit today is for his physical exam. His only complaint at the present time is of fatigue. He does have snoring but no true signs of sleep apnea. He will consider and talk to his wife about whether he is having some apneic episodes or if he decides he wants to go ahead with a sleep study. His routine labs were done today -  Signed, Earl Lites, MD 03/05/2012 8:15 AM

## 2012-03-06 LAB — TESTOSTERONE, FREE, TOTAL, SHBG
Sex Hormone Binding: 21 nmol/L (ref 13–71)
Testosterone, Free: 110.3 pg/mL (ref 47.0–244.0)
Testosterone-% Free: 2.6 % (ref 1.6–2.9)
Testosterone: 426.34 ng/dL (ref 300–890)

## 2012-03-06 LAB — COMPREHENSIVE METABOLIC PANEL
ALT: 33 U/L (ref 0–53)
AST: 27 U/L (ref 0–37)
Albumin: 4.7 g/dL (ref 3.5–5.2)
Alkaline Phosphatase: 63 U/L (ref 39–117)
BUN: 13 mg/dL (ref 6–23)
CO2: 26 mEq/L (ref 19–32)
Calcium: 9.7 mg/dL (ref 8.4–10.5)
Chloride: 104 mEq/L (ref 96–112)
Creat: 0.83 mg/dL (ref 0.50–1.35)
Glucose, Bld: 82 mg/dL (ref 70–99)
Potassium: 4 mEq/L (ref 3.5–5.3)
Sodium: 142 mEq/L (ref 135–145)
Total Bilirubin: 0.6 mg/dL (ref 0.3–1.2)
Total Protein: 7.3 g/dL (ref 6.0–8.3)

## 2012-03-06 LAB — TSH: TSH: 2.133 u[IU]/mL (ref 0.350–4.500)

## 2012-03-06 LAB — VITAMIN D 25 HYDROXY (VIT D DEFICIENCY, FRACTURES): Vit D, 25-Hydroxy: 60 ng/mL (ref 30–89)

## 2012-03-06 LAB — PSA: PSA: 1.17 ng/mL (ref <=4.00)

## 2012-03-06 LAB — LIPID PANEL
Cholesterol: 183 mg/dL (ref 0–200)
HDL: 42 mg/dL (ref >39)
LDL Cholesterol: 119 mg/dL — ABNORMAL HIGH (ref 0–99)
Total CHOL/HDL Ratio: 4.4 Ratio
Triglycerides: 112 mg/dL (ref <150)
VLDL: 22 mg/dL (ref 0–40)

## 2012-03-06 NOTE — Telephone Encounter (Signed)
Left message for patient to call me back. 

## 2012-03-07 NOTE — Telephone Encounter (Signed)
OK to add a Hep C test to his blood at the lab.

## 2012-03-07 NOTE — Telephone Encounter (Signed)
Pt wants to know if he needs to be tested for Hepatitis C, his wife asked this and he did not discuss with you when he was seen yesterday. Please advise his phone# [phone] I can call him back.

## 2012-03-08 ENCOUNTER — Ambulatory Visit: Payer: BC Managed Care – PPO

## 2012-03-08 ENCOUNTER — Ambulatory Visit: Payer: BC Managed Care – PPO | Admitting: Internal Medicine

## 2012-03-08 VITALS — BP 128/76 | HR 85 | Temp 98.5°F | Resp 17 | Ht 71.0 in | Wt 192.0 lb

## 2012-03-08 DIAGNOSIS — K5790 Diverticulosis of intestine, part unspecified, without perforation or abscess without bleeding: Secondary | ICD-10-CM

## 2012-03-08 DIAGNOSIS — K5732 Diverticulitis of large intestine without perforation or abscess without bleeding: Secondary | ICD-10-CM

## 2012-03-08 DIAGNOSIS — R109 Unspecified abdominal pain: Secondary | ICD-10-CM

## 2012-03-08 DIAGNOSIS — K5730 Diverticulosis of large intestine without perforation or abscess without bleeding: Secondary | ICD-10-CM

## 2012-03-08 DIAGNOSIS — K5792 Diverticulitis of intestine, part unspecified, without perforation or abscess without bleeding: Secondary | ICD-10-CM

## 2012-03-08 LAB — POCT UA - MICROSCOPIC ONLY
Casts, Ur, LPF, POC: NEGATIVE
Crystals, Ur, HPF, POC: NEGATIVE
Yeast, UA: NEGATIVE

## 2012-03-08 LAB — POCT CBC
Granulocyte percent: 66.3 %G (ref 37–80)
HCT, POC: 53.6 % (ref 43.5–53.7)
Hemoglobin: 16.8 g/dL (ref 14.1–18.1)
Lymph, poc: 2.1 (ref 0.6–3.4)
MCH, POC: 27.7 pg (ref 27–31.2)
MCHC: 31.3 g/dL — AB (ref 31.8–35.4)
MCV: 88.3 fL (ref 80–97)
MID (cbc): 0.5 (ref 0–0.9)
MPV: 7.8 fL (ref 0–99.8)
POC Granulocyte: 5.1 (ref 2–6.9)
POC LYMPH PERCENT: 26.9 %L (ref 10–50)
POC MID %: 6.8 %M (ref 0–12)
Platelet Count, POC: 201 10*3/uL (ref 142–424)
RBC: 6.07 M/uL (ref 4.69–6.13)
RDW, POC: 14.2 %
WBC: 7.7 10*3/uL (ref 4.6–10.2)

## 2012-03-08 LAB — POCT URINALYSIS DIPSTICK
Bilirubin, UA: NEGATIVE
Blood, UA: NEGATIVE
Glucose, UA: NEGATIVE
Ketones, UA: NEGATIVE
Leukocytes, UA: NEGATIVE
Nitrite, UA: NEGATIVE
Protein, UA: NEGATIVE
Spec Grav, UA: 1.025
Urobilinogen, UA: 0.2
pH, UA: 6.5

## 2012-03-08 LAB — HEPATITIS C ANTIBODY: HCV Ab: NEGATIVE

## 2012-03-08 LAB — POCT SEDIMENTATION RATE: POCT SED RATE: 3 mm/hr (ref 0–22)

## 2012-03-08 IMAGING — CR DG ABDOMEN 2V
2 series · 2 of 2 positions shown · non-contrast
Comparison: Preliminary reading of Dr.JAYLON

CLINICAL DATA: Abdominal pain

ABDOMEN - 2 VIEW

[AP (1 of 2)]
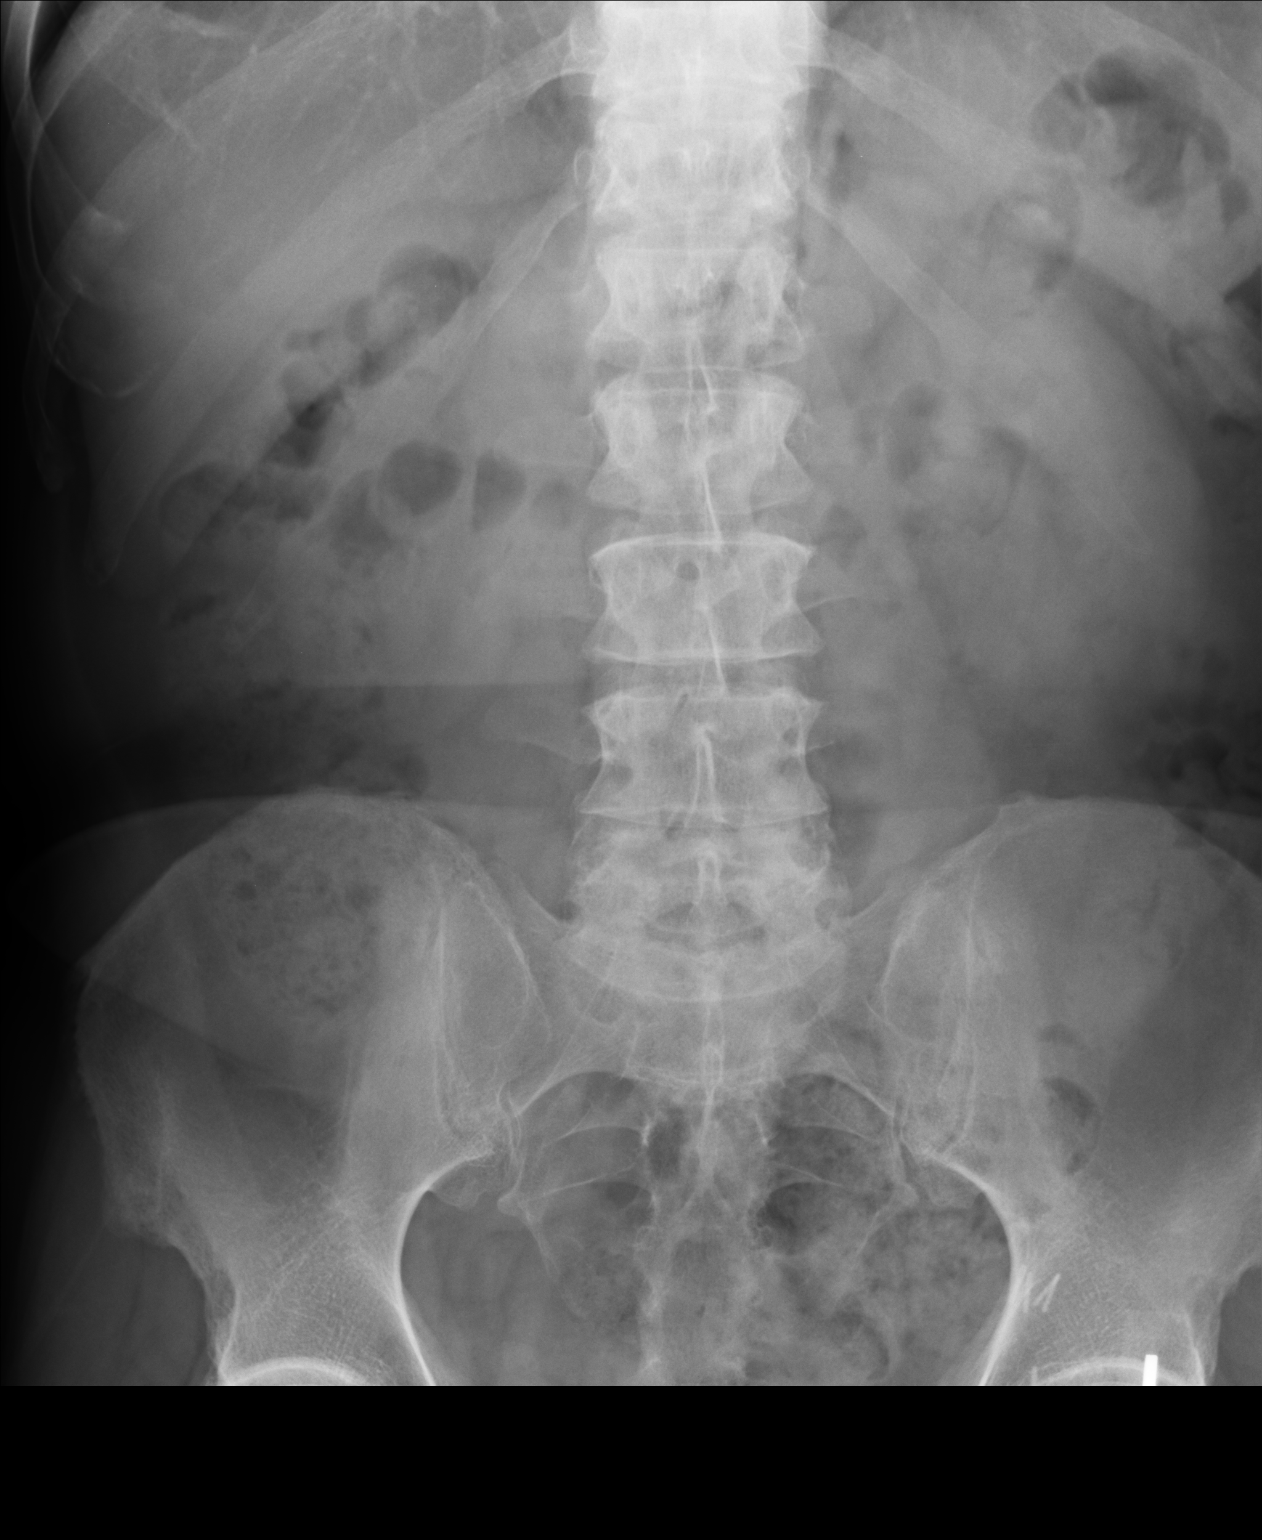

[AP (2 of 2)]
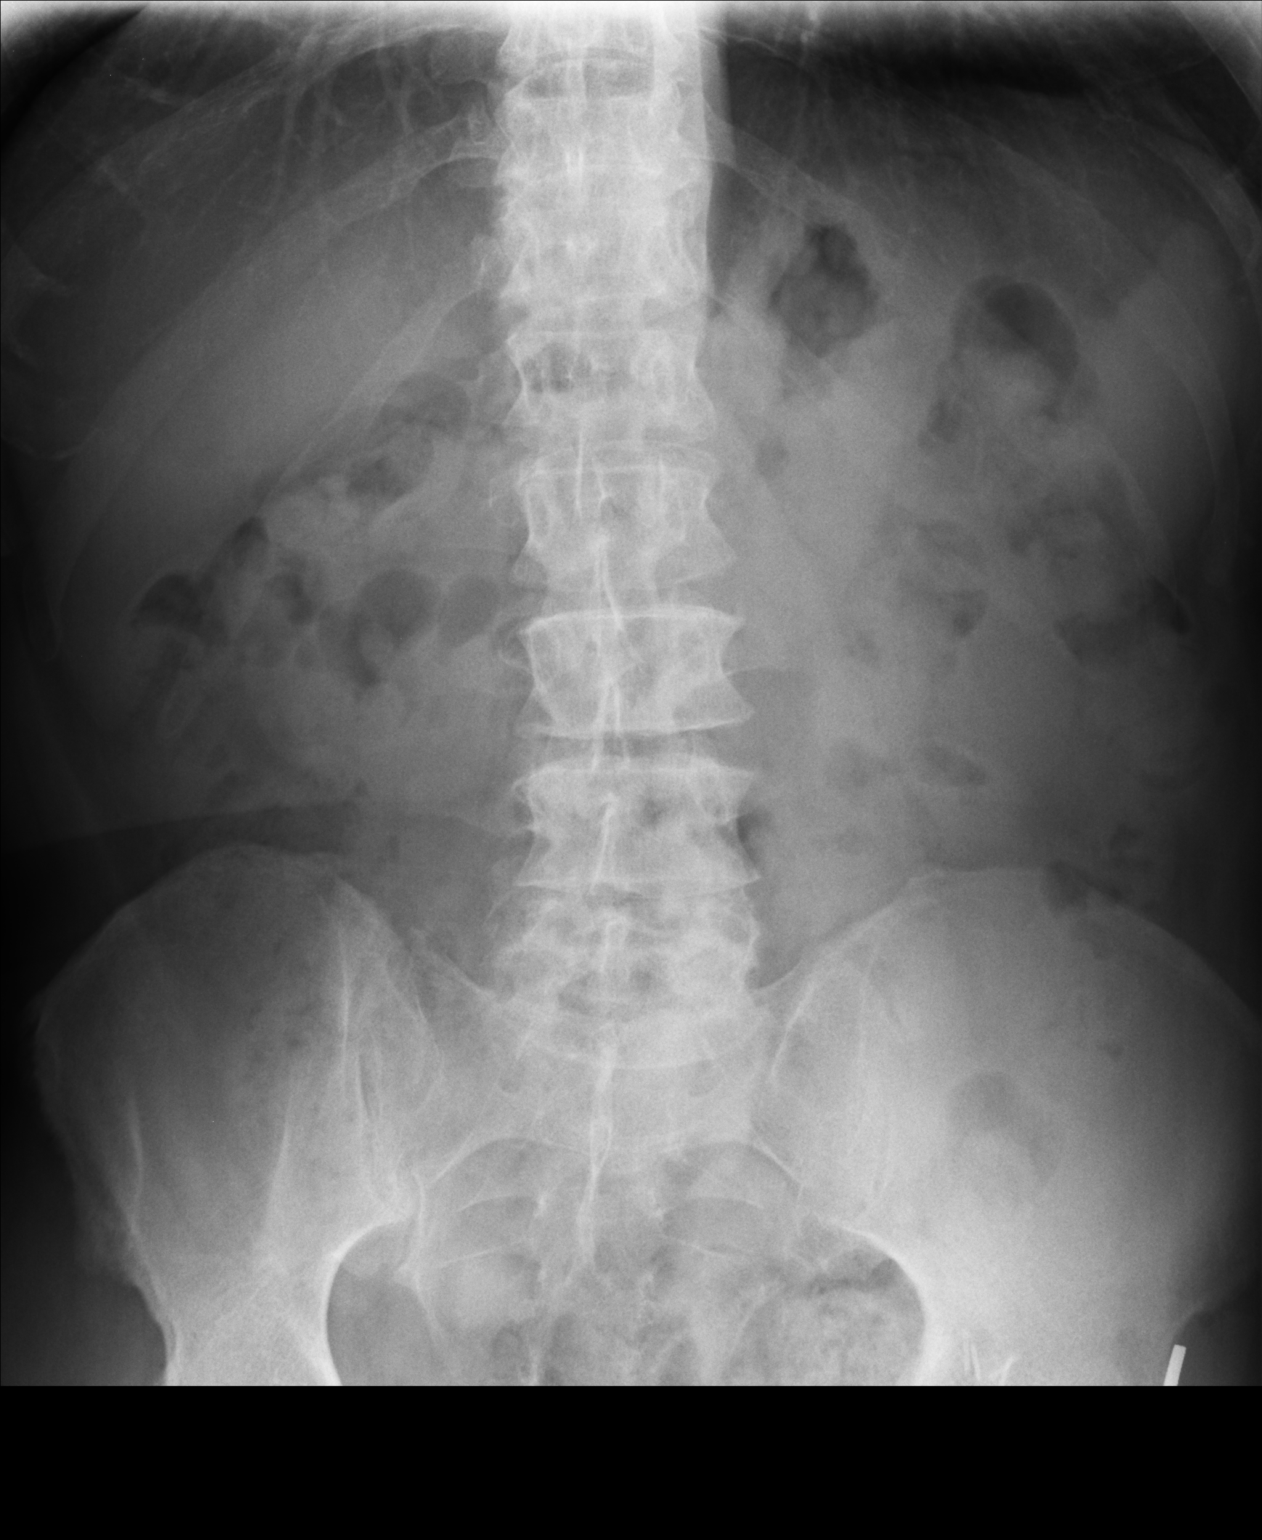

[2 of 2 positions shown; findings below may reference images not displayed]

FINDINGS: There is nonspecific nonobstructive bowel gas pattern.
Mild degenerative changes lower lumbar spine.  No pathologic
calcifications are identified.
IMPRESSION: Nonspecific nonobstructive bowel gas pattern.

Clinically significant discrepancy from primary report, if
provided: None

## 2012-03-08 MED ORDER — CIPROFLOXACIN HCL 500 MG PO TABS: 500.0000 mg | ORAL_TABLET | Freq: Two times a day (BID) | ORAL | Status: AC

## 2012-03-08 MED ORDER — METRONIDAZOLE 500 MG PO TABS: 500.0000 mg | ORAL_TABLET | Freq: Three times a day (TID) | ORAL | Status: AC

## 2012-03-08 NOTE — Progress Notes (Signed)
Subjective:    Patient ID: Nicholas West, male    DOB: 10/14/1955, 56 y.o.   MRN: [id number]  HPI Abdominal pain Onset 3 eves ago Moderate in severity Increased in the lower abdomen and the right lower quadrant  moderate in severity Radiates across the lower abdomen  assoc with fatigue and anorexia  no vomiting  some loser stool and thinner stool  no fever No dysuria     Review of Systems  Constitutional: Positive for appetite change and fatigue.  HENT: Negative.   Eyes: Negative.   Respiratory: Negative.   Cardiovascular: Negative.   Gastrointestinal: Positive for abdominal pain and diarrhea.  Genitourinary: Negative.   Musculoskeletal: Negative.   Skin: Negative.   Neurological: Negative.   Hematological: Negative.   Psychiatric/Behavioral: Negative.   All other systems reviewed and are negative.       Objective:   Physical Exam  Nursing note and vitals reviewed. Constitutional: He is oriented to person, place, and time. He appears well-developed and well-nourished.  HENT:  Head: Normocephalic and atraumatic.  Right Ear: External ear normal.  Left Ear: External ear normal.  Nose: Nose normal.  Mouth/Throat: Oropharynx is clear and moist.  Eyes: Conjunctivae and EOM are normal. Pupils are equal, round, and reactive to light.  Neck: Normal range of motion.  Cardiovascular: Normal rate, regular rhythm and normal heart sounds.   Pulmonary/Chest: Effort normal and breath sounds normal.  Abdominal: Soft. There is tenderness.  Musculoskeletal: Normal range of motion.  Neurological: He is alert and oriented to person, place, and time. He has normal reflexes.  Skin: Skin is warm and dry.  Psychiatric: He has a normal mood and affect. His behavior is normal. Judgment and thought content normal.   Results for orders placed in visit on 03/08/12  POCT CBC      Component Value Range   WBC 7.7  4.6 - 10.2 K/uL   Lymph, poc 2.1  0.6 - 3.4   POC LYMPH PERCENT 26.9   10 - 50 %L   MID (cbc) 0.5  0 - 0.9   POC MID % 6.8  0 - 12 %M   POC Granulocyte 5.1  2 - 6.9   Granulocyte percent 66.3  37 - 80 %G   RBC 6.07  4.69 - 6.13 M/uL   Hemoglobin 16.8  14.1 - 18.1 g/dL   HCT, POC 40.9  81.1 - 53.7 %   MCV 88.3  80 - 97 fL   MCH, POC 27.7  27 - 31.2 pg   MCHC 31.3 (*) 31.8 - 35.4 g/dL   RDW, POC 91.4     Platelet Count, POC 201  142 - 424 K/uL   MPV 7.8  0 - 99.8 fL  POCT UA - MICROSCOPIC ONLY      Component Value Range   WBC, Ur, HPF, POC 1-2     RBC, urine, microscopic 0-2     Bacteria, U Microscopic trace     Mucus, UA trace     Epithelial cells, urine per micros 1-2     Crystals, Ur, HPF, POC neg     Casts, Ur, LPF, POC neg     Yeast, UA neg    POCT URINALYSIS DIPSTICK      Component Value Range   Color, UA yellow     Clarity, UA clear     Glucose, UA neg     Bilirubin, UA neg     Ketones, UA neg  Spec Grav, UA 1.025     Blood, UA neg     pH, UA 6.5     Protein, UA neg     Urobilinogen, UA 0.2     Nitrite, UA neg     Leukocytes, UA Negative      ua normal trace bacti wbc normal [facility] reading (PRIMARY) by  Dr. Mindi Junker.xray negitivse       Assessment & Plan:  Abdominal pain hx of diverticular dx. Cbc normal wbc normal ua except for bacti. Will rx with antibiotics and close followup.

## 2012-03-08 NOTE — Telephone Encounter (Signed)
Test added on by monique from [employer]

## 2012-03-08 NOTE — Telephone Encounter (Signed)
Called patient advised lab will be added, can you add this on to labwork done on 03/05/12?

## 2012-03-08 NOTE — Patient Instructions (Signed)
Take meds as directed, return for re eval within 48 ours if no improvement or any worsening of symptoms.

## 2012-04-03 ENCOUNTER — Ambulatory Visit (INDEPENDENT_AMBULATORY_CARE_PROVIDER_SITE_OTHER): Payer: Self-pay | Admitting: Cardiology

## 2012-04-03 ENCOUNTER — Encounter: Payer: Self-pay | Admitting: Cardiology

## 2012-04-03 VITALS — BP 112/84 | HR 60 | Ht 71.0 in | Wt 194.0 lb

## 2012-04-03 DIAGNOSIS — E7800 Pure hypercholesterolemia, unspecified: Secondary | ICD-10-CM

## 2012-04-03 DIAGNOSIS — E785 Hyperlipidemia, unspecified: Secondary | ICD-10-CM

## 2012-04-03 DIAGNOSIS — R079 Chest pain, unspecified: Secondary | ICD-10-CM

## 2012-04-03 HISTORY — DX: Pure hypercholesterolemia, unspecified: E78.00

## 2012-04-03 NOTE — Progress Notes (Signed)
Nicholas West Date of Birth: [birth year] Medical Record #[id number]  History of Present Illness: Mr. Heffner is a very pleasant 56 year old white male seen at the request of Dr. Cleta Alberts for evaluation of chest pain. His only cardiac risk factor is his history of mild hyper cholesterolemia. He states that over the past several months he has had couple of episodes of significant chest pain. He describes this as a mid substernal chest pain radiating to his right jaw. It is not clearly exertional. He states that if he sits down for a few minutes it goes away. He has no prior cardiac history. He's had no prior cardiac evaluation. He has no history of diabetes or hypertension. He is a nonsmoker.  Current Outpatient Prescriptions on File Prior to Visit  Medication Sig Dispense Refill  . omeprazole (PRILOSEC) 20 MG capsule Take 1 capsule (20 mg total) by mouth daily.  30 capsule  11  . sildenafil (VIAGRA) 100 MG tablet Take 1 tablet (100 mg total) by mouth daily as needed.  10 tablet  11  . tacrolimus (PROTOPIC) 0.1 % ointment Apply topically 2 (two) times daily.      . Testosterone (AXIRON) 30 MG/ACT SOLN Apply 60 mg topically 1 day or 1 dose.  90 mL  5    Allergies  Allergen Reactions  . Penicillins     rash    Past Medical History  Diagnosis Date  . Abdominal pain   . Diverticular disease   . Diverticulitis   . GERD (gastroesophageal reflux disease)     Past Surgical History  Procedure Date  . Partial colectomy 11/11    History  Smoking status  . Never Smoker   Smokeless tobacco  . Not on file    History  Alcohol Use No    History reviewed. No pertinent family history.  Review of Systems: The review of systems is positive for diverticular disease. He is status post partial colectomy All other systems were reviewed and are negative.  Physical Exam: BP 112/84  Pulse 60  Ht 5\' 11"  (1.803 m)  Wt 87.998 kg (194 lb)  BMI 27.06 kg/m2 He is a pleasant middle-aged white male  in no acute distress. The patient is alert and oriented x 3.  The mood and affect are normal.  The skin is warm and dry.  Color is normal.  The HEENT exam reveals that the sclera are nonicteric.  The mucous membranes are moist.  The carotids are 2+ without bruits.  There is no thyromegaly.  There is no JVD.  The lungs are clear.  The chest wall is non tender.  The heart exam reveals a regular rate with a normal S1 and S2.  There are no murmurs, gallops, or rubs.  The PMI is not displaced.   Abdominal exam reveals good bowel sounds.  There is no guarding or rebound.  There is no hepatosplenomegaly or tenderness.  There are no masses.  Exam of the legs reveal no clubbing, cyanosis, or edema.  The legs are without rashes.  The distal pulses are intact.  Cranial nerves II - XII are intact.  Motor and sensory functions are intact.  The gait is normal.  LABORATORY DATA:  ECG demonstrates normal sinus rhythm with a rate of 60 beats per minute. There no acute ST or T wave changes.   Assessment / Plan:  1. Chest pain in a middle-aged male with mild hyperlipidemia. He is at intermediate risk for coronary disease. I recommend further  evaluation with a stress echo.  2. Mild hyperlipidemia. LDL cholesterol 119.

## 2012-04-03 NOTE — Patient Instructions (Signed)
We will schedule you for a stress Echo.   

## 2012-04-11 ENCOUNTER — Encounter: Payer: Self-pay | Admitting: Cardiology

## 2012-04-11 ENCOUNTER — Ambulatory Visit (HOSPITAL_COMMUNITY): Payer: BC Managed Care – PPO | Attending: Internal Medicine

## 2012-04-11 DIAGNOSIS — R079 Chest pain, unspecified: Secondary | ICD-10-CM

## 2012-04-11 DIAGNOSIS — E785 Hyperlipidemia, unspecified: Secondary | ICD-10-CM

## 2012-04-11 DIAGNOSIS — R0989 Other specified symptoms and signs involving the circulatory and respiratory systems: Secondary | ICD-10-CM

## 2012-04-11 DIAGNOSIS — R072 Precordial pain: Secondary | ICD-10-CM

## 2012-04-11 DIAGNOSIS — R002 Palpitations: Secondary | ICD-10-CM | POA: Insufficient documentation

## 2012-04-11 NOTE — Progress Notes (Signed)
Echocardiogram performed.  

## 2012-04-30 ENCOUNTER — Ambulatory Visit: Payer: BC Managed Care – PPO | Admitting: Emergency Medicine

## 2012-04-30 ENCOUNTER — Encounter: Payer: Self-pay | Admitting: Emergency Medicine

## 2012-04-30 VITALS — BP 118/78 | HR 64 | Temp 98.1°F | Resp 16 | Ht 71.0 in | Wt 191.6 lb

## 2012-04-30 DIAGNOSIS — R079 Chest pain, unspecified: Secondary | ICD-10-CM

## 2012-04-30 DIAGNOSIS — Z23 Encounter for immunization: Secondary | ICD-10-CM

## 2012-04-30 DIAGNOSIS — E236 Other disorders of pituitary gland: Secondary | ICD-10-CM

## 2012-04-30 NOTE — Progress Notes (Signed)
  Subjective:    Patient ID: Nicholas West, male    DOB: 04/10/1956, 56 y.o.   MRN: [id number]  HPI problem #1 him the him to followup on his episode of chest and jaw pain. He went to see Dr. Peter Swaziland underwent a stress echo with normal findings.  Problem #2 patient continues on testosterone replacement for hypogonadism. His levels done last visit were in range his PSA remains stable  Problem #3 patient in followup of his fatigue. Invasive question about whether he could have sleep apnea but his wife states he has no snoring. He does not have any apneic episodes while he is  Sleeping. His fatigue was much improved. He feels that some of his fatigue was related to stress or whether he could have heart disease.     Review of Systems     Objective:   Physical Exam HEENT exam is unremarkable. Neck is supple. Chest is clear cardiac exam unremarkable.        Assessment & Plan:  Problem #1 chest pain. Patient had a normal stress echo no further followup necessary.  Problem #2 hypogonadism patient stable at present on current dose of testosterone replacement recheck levels in 6 months.  Problem #3 fatigue. No further workup indicated at the present time. We'll hold off on sleep study.

## 2012-09-05 ENCOUNTER — Other Ambulatory Visit: Payer: Self-pay | Admitting: Emergency Medicine

## 2012-09-05 NOTE — Telephone Encounter (Signed)
Due for labs April 2014

## 2012-09-10 ENCOUNTER — Encounter: Payer: Self-pay | Admitting: Emergency Medicine

## 2012-09-10 ENCOUNTER — Ambulatory Visit (INDEPENDENT_AMBULATORY_CARE_PROVIDER_SITE_OTHER): Payer: BC Managed Care – PPO | Admitting: Emergency Medicine

## 2012-09-10 VITALS — BP 106/74 | HR 118 | Temp 98.0°F | Resp 16 | Ht 71.0 in | Wt 199.0 lb

## 2012-09-10 DIAGNOSIS — R002 Palpitations: Secondary | ICD-10-CM

## 2012-09-10 DIAGNOSIS — E236 Other disorders of pituitary gland: Secondary | ICD-10-CM

## 2012-09-10 DIAGNOSIS — Z79899 Other long term (current) drug therapy: Secondary | ICD-10-CM

## 2012-09-10 LAB — PSA: PSA: 1.1 ng/mL (ref <=4.00)

## 2012-09-10 NOTE — Progress Notes (Signed)
  Subjective:    Patient ID: Nicholas West, male    DOB: [birth year], 57 y.o.   MRN: [id number]  HPI patient enters for recheck. He is in to check on his erectile dysfunction currently under treatment with testosterone replacement.    Review of Systems patient does states that he intermittently feels of palpitations in his chest is not associated with chest pain or shortness of breath.     Objective:   Physical Exam physical exam reveals an alert cooperative gentleman chest is clear heart is very irregular no murmurs  Ekg normal sinus rhythm      Assessment & Plan:  I suspect patient is going in and out of atrial fib. We'll make an appointment to see Dr. Swaziland to see if he wants to do a Holter monitor on him to document these episodes

## 2012-09-11 LAB — TESTOSTERONE, FREE, TOTAL, SHBG
Sex Hormone Binding: 24 nmol/L (ref 13–71)
Testosterone, Free: 65.5 pg/mL (ref 47.0–244.0)
Testosterone-% Free: 2.3 % (ref 1.6–2.9)
Testosterone: 282 ng/dL — ABNORMAL LOW (ref 300–890)

## 2012-09-15 ENCOUNTER — Encounter: Payer: Self-pay | Admitting: Radiology

## 2012-09-25 ENCOUNTER — Encounter: Payer: Self-pay | Admitting: Nurse Practitioner

## 2012-09-25 ENCOUNTER — Ambulatory Visit (INDEPENDENT_AMBULATORY_CARE_PROVIDER_SITE_OTHER): Payer: BC Managed Care – PPO

## 2012-09-25 ENCOUNTER — Ambulatory Visit (INDEPENDENT_AMBULATORY_CARE_PROVIDER_SITE_OTHER): Payer: BC Managed Care – PPO | Admitting: Nurse Practitioner

## 2012-09-25 VITALS — BP 120/70 | HR 66 | Ht 73.0 in | Wt 200.0 lb

## 2012-09-25 DIAGNOSIS — R002 Palpitations: Secondary | ICD-10-CM

## 2012-09-25 LAB — BASIC METABOLIC PANEL
BUN: 17 mg/dL (ref 6–23)
CO2: 27 mEq/L (ref 19–32)
Calcium: 9.7 mg/dL (ref 8.4–10.5)
Chloride: 106 mEq/L (ref 96–112)
Creatinine, Ser: 1.1 mg/dL (ref 0.4–1.5)
GFR: 76.21 mL/min (ref >60.00)
Glucose, Bld: 88 mg/dL (ref 70–99)
Potassium: 4.4 mEq/L (ref 3.5–5.1)
Sodium: 141 mEq/L (ref 135–145)

## 2012-09-25 LAB — TSH: TSH: 1.99 u[IU]/mL (ref 0.35–5.50)

## 2012-09-25 LAB — CBC WITH DIFFERENTIAL/PLATELET
Basophils Absolute: 0 10*3/uL (ref 0.0–0.1)
Basophils Relative: 0.5 % (ref 0.0–3.0)
Eosinophils Absolute: 0 10*3/uL (ref 0.0–0.7)
Eosinophils Relative: 0.2 % (ref 0.0–5.0)
HCT: 45.4 % (ref 39.0–52.0)
Hemoglobin: 15.5 g/dL (ref 13.0–17.0)
Lymphocytes Relative: 35.6 % (ref 12.0–46.0)
Lymphs Abs: 2 10*3/uL (ref 0.7–4.0)
MCHC: 34.2 g/dL (ref 30.0–36.0)
MCV: 84.8 fl (ref 78.0–100.0)
Monocytes Absolute: 0.6 10*3/uL (ref 0.1–1.0)
Monocytes Relative: 10.4 % (ref 3.0–12.0)
Neutro Abs: 3 10*3/uL (ref 1.4–7.7)
Neutrophils Relative %: 53.3 % (ref 43.0–77.0)
Platelets: 162 10*3/uL (ref 150.0–400.0)
RBC: 5.35 Mil/uL (ref 4.22–5.81)
RDW: 12.9 % (ref 11.5–14.6)
WBC: 5.6 10*3/uL (ref 4.5–10.5)

## 2012-09-25 NOTE — Patient Instructions (Addendum)
We need to place an event monitor for your palpitations  We are rechecking labs today  Call the  [facility] office at [phone] if you have any questions, problems or concerns.

## 2012-09-25 NOTE — Progress Notes (Addendum)
Nicholas West Date of Birth: [birth year] Medical Record #[id number]  History of Present Illness: [name] is seen back today for a work in visit. He is seen for Dr. Swaziland. He has no real cardiac history. Negative stress echo last year done for chest pain. He has mild HLD, GERD and ED that is treated with testosterone.   He was referred here from the Urgent Care for palpitations. Concern was for possible atrial fib. His EKG was reported normal (no scanned copy yet).   He comes in today. He is here with his wife. He is basically doing ok. No chest pain. Not short of breath. Not as active as he should. BP normally ok. Noted that at his regular visit with Dr. Cleta Alberts that Dr. Cleta Alberts noted an irregular rhythm while listening to his chest. The patient does endorse some occasional "flutters". Not using much caffeine or chocolate. No alcohol use. Not lightheaded or dizzy. No syncope reported.   Current Outpatient Prescriptions on File Prior to Visit  Medication Sig Dispense Refill  . ALFALFA PO Take by mouth. Shaklee Alfalfa tabs 2.85 g alfalfa powder, 300 mg calcium      . Ascorbic Acid (VITAMIN C PO) Take 500 mg by mouth daily.      Marland Kitchen aspirin 81 MG tablet Take 81 mg by mouth daily.      Randa Ngo 30 MG/ACT SOLN APPLY 60MG  (2 PUMPS) TOPICALLY DAILY AS DIRECTED  90 mL  4  . B Complex Vitamins (B COMPLEX PO) Take by mouth. With folic acid      . cholecalciferol (VITAMIN D) 1000 UNITS tablet Take 1,000 Units by mouth 2 (two) times daily.      Marland Kitchen GARLIC PO Take 1 g by mouth.      . Multiple Vitamin (MULTIVITAMIN) tablet Take 1 tablet by mouth daily.      . Omega-3 Fatty Acids (OMEGA 3 PO) Take by mouth.      Marland Kitchen omeprazole (PRILOSEC) 20 MG capsule Take 1 capsule (20 mg total) by mouth daily.  30 capsule  11  . sildenafil (VIAGRA) 100 MG tablet Take 1 tablet (100 mg total) by mouth daily as needed.  10 tablet  11  . tacrolimus (PROTOPIC) 0.1 % ointment Apply topically 2 (two) times daily.       No current  facility-administered medications on file prior to visit.    Allergies  Allergen Reactions  . Penicillins     rash    Past Medical History  Diagnosis Date  . Abdominal pain   . Diverticular disease   . Diverticulitis   . GERD (gastroesophageal reflux disease)   . Chest pain     negative stress echo in September 2013    Past Surgical History  Procedure Laterality Date  . Partial colectomy  11/11    History  Smoking status  . Never Smoker   Smokeless tobacco  . Not on file    History  Alcohol Use No    No family history on file.  Review of Systems: The review of systems is per the HPI.  All other systems were reviewed and are negative.  Physical Exam: There were no vitals taken for this visit. Patient is very pleasant and in no acute distress. Skin is warm and dry. Color is normal.  HEENT is unremarkable. Normocephalic/atraumatic. PERRL. Sclera are nonicteric. Neck is supple. No masses. No JVD. Lungs are clear. Cardiac exam shows a regular rate and rhythm. Abdomen is soft. Extremities are  without edema. Gait and ROM are intact. No gross neurologic deficits noted.  LABORATORY DATA: Pending  Lab Results  Component Value Date   WBC 7.7 03/08/2012   HGB 16.8 03/08/2012   HCT 53.6 03/08/2012   PLT 156 03/05/2012   GLUCOSE 82 03/05/2012   CHOL 183 03/05/2012   TRIG 112 03/05/2012   HDL 42 03/05/2012   LDLCALC 086* 03/05/2012   ALT 33 03/05/2012   AST 27 03/05/2012   NA 142 03/05/2012   K 4.0 03/05/2012   CL 104 03/05/2012   CREATININE 0.83 03/05/2012   BUN 13 03/05/2012   CO2 26 03/05/2012   TSH 2.133 03/05/2012   PSA 1.10 09/10/2012     Stress Echo Study Conclusions  Stress ECG conclusions: The stress ECG was normal.  Impressions:  - Normal study after maximal exercise.  Assessment / Plan: 1. Palpitations - will place event monitor. Update his labs as well. We will be in touch with him regarding those results at the end of the 30 days. Tentatively see him back prn unless the  monitor shows significant arrhythmia.   2. Mild HLD   3. Prior normal stress echo  Patient is agreeable to this plan and will call if any problems develop in the interim.

## 2012-09-25 NOTE — Progress Notes (Signed)
Please make a copy of the EKG Mr. Swinger had done at 30 and send it to the [facility] care to Norma Fredrickson

## 2012-09-26 ENCOUNTER — Telehealth: Payer: Self-pay | Admitting: Nurse Practitioner

## 2012-09-26 NOTE — Progress Notes (Signed)
Anabel Halon Placed a 30 day event monitor on patient

## 2012-09-26 NOTE — Telephone Encounter (Signed)
Pt's wife rtn call to debby lefler, pls call

## 2012-09-26 NOTE — Telephone Encounter (Signed)
N/A.  LM that lab results were mailed to pt's address.

## 2012-09-30 ENCOUNTER — Telehealth: Payer: Self-pay | Admitting: *Deleted

## 2012-09-30 MED ORDER — METOPROLOL TARTRATE 25 MG PO TABS: 25.0000 mg | ORAL_TABLET | Freq: Two times a day (BID) | ORAL | Status: DC

## 2012-09-30 NOTE — Telephone Encounter (Signed)
Called patient because of abnormal rhythm strips transmitted yesterday thru E cardio showing SVT vs AF. Reviewed with Norma Fredrickson NP who had seen the patient on 2 26 2014. She ordered Metoprolol 25mg  BID. Patient aware of above. He will continue to use monitor and I made him an appointment to see Dr. Swaziland in follow up on 3/27.

## 2012-10-18 ENCOUNTER — Encounter: Payer: Self-pay | Admitting: Emergency Medicine

## 2012-10-24 ENCOUNTER — Ambulatory Visit (INDEPENDENT_AMBULATORY_CARE_PROVIDER_SITE_OTHER): Payer: BC Managed Care – PPO | Admitting: Cardiology

## 2012-10-24 ENCOUNTER — Encounter: Payer: Self-pay | Admitting: Cardiology

## 2012-10-24 VITALS — BP 110/70 | HR 78 | Ht 73.0 in | Wt 196.4 lb

## 2012-10-24 DIAGNOSIS — E7800 Pure hypercholesterolemia, unspecified: Secondary | ICD-10-CM

## 2012-10-24 DIAGNOSIS — I480 Paroxysmal atrial fibrillation: Secondary | ICD-10-CM

## 2012-10-24 DIAGNOSIS — I4891 Unspecified atrial fibrillation: Secondary | ICD-10-CM

## 2012-10-24 HISTORY — DX: Paroxysmal atrial fibrillation: I48.0

## 2012-10-24 NOTE — Progress Notes (Signed)
Nicholas West Date of Birth: 05-04-1956 Medical Record #[id number]  History of Present Illness: Nicholas West is seen back today for a followup visit. He had a negative stress echo last year done for chest pain. He has mild HLD, GERD and ED that is treated with testosterone. He has recently had some mild palpitations. An event monitor was placed and demonstrated episodes of atrial fibrillation with a rapid ventricular response and heart rates up to 165-170. He actually was asymptomatic with this episode. Labwork were checked and showed normal electrolytes, renal function, CBC, and TSH. His palpitations are mild and sporadic. He has no dizziness, shortness of breath, or chest pain.  Current Outpatient Prescriptions on File Prior to Visit  Medication Sig Dispense Refill  . ALFALFA PO Take by mouth. Shaklee Alfalfa tabs 2.85 g alfalfa powder, 300 mg calcium      . Ascorbic Acid (VITAMIN C PO) Take 500 mg by mouth daily.      Marland Kitchen aspirin 81 MG tablet Take 81 mg by mouth daily.      Randa Ngo 30 MG/ACT SOLN APPLY 60MG  (2 PUMPS) TOPICALLY DAILY AS DIRECTED  90 mL  4  . B Complex Vitamins (B COMPLEX PO) Take by mouth. With folic acid      . cholecalciferol (VITAMIN D) 1000 UNITS tablet Take 1,000 Units by mouth 2 (two) times daily.      Marland Kitchen GARLIC PO Take 1 g by mouth.      . metoprolol tartrate (LOPRESSOR) 25 MG tablet Take 1 tablet (25 mg total) by mouth 2 (two) times daily.  60 tablet  1  . Multiple Vitamin (MULTIVITAMIN) tablet Take 1 tablet by mouth daily.      . Omega-3 Fatty Acids (OMEGA 3 PO) Take by mouth.      Marland Kitchen omeprazole (PRILOSEC) 20 MG capsule Take 1 capsule (20 mg total) by mouth daily.  30 capsule  11  . sildenafil (VIAGRA) 100 MG tablet Take 1 tablet (100 mg total) by mouth daily as needed.  10 tablet  11  . tacrolimus (PROTOPIC) 0.1 % ointment Apply topically 2 (two) times daily.       No current facility-administered medications on file prior to visit.    Allergies  Allergen Reactions   . Penicillins     rash    Past Medical History  Diagnosis Date  . Abdominal pain   . Diverticular disease   . Diverticulitis   . GERD (gastroesophageal reflux disease)   . Chest pain     negative stress echo in September 2013    Past Surgical History  Procedure Laterality Date  . Partial colectomy  11/11    History  Smoking status  . Never Smoker   Smokeless tobacco  . Not on file    History  Alcohol Use No    No family history on file.  Review of Systems: The review of systems is per the HPI.  All other systems were reviewed and are negative.  Physical Exam: BP 110/70  Pulse 78  Ht 6\' 1"  (1.854 m)  Wt 196 lb 6.4 oz (89.086 kg)  BMI 25.92 kg/m2 Patient is very pleasant and in no acute distress. Skin is warm and dry. Color is normal.  HEENT is unremarkable. Normocephalic/atraumatic. PERRL. Sclera are nonicteric. Neck is supple. No masses. No JVD. Lungs are clear. Cardiac exam shows a regular rate and rhythm. Abdomen is soft. Extremities are without edema. Gait and ROM are intact. No gross neurologic deficits noted.  LABORATORY DATA: Pending  Lab Results  Component Value Date   WBC 5.6 09/25/2012   HGB 15.5 09/25/2012   HCT 45.4 09/25/2012   PLT 162.0 09/25/2012   GLUCOSE 88 09/25/2012   CHOL 183 03/05/2012   TRIG 112 03/05/2012   HDL 42 03/05/2012   LDLCALC 161* 03/05/2012   ALT 33 03/05/2012   AST 27 03/05/2012   NA 141 09/25/2012   K 4.4 09/25/2012   CL 106 09/25/2012   CREATININE 1.1 09/25/2012   BUN 17 09/25/2012   CO2 27 09/25/2012   TSH 1.99 09/25/2012   PSA 1.10 09/10/2012      Assessment / Plan: 1. Paroxysmal atrial fibrillation. Patient is minimally symptomatic. He has a [place] and [place] vascular score of 0. I recommended metoprolol 25 mg twice a day. I recommended he take a baby aspirin daily. We will obtain an echocardiogram to complete his workup. Unless he has significant increase in symptoms or duration of atrial fibrillation we will continue conservative  therapy.  2. Mild HLD   3. Prior normal stress echo

## 2012-10-24 NOTE — Patient Instructions (Signed)
Continue your current therapy with metoprolol and ASA 81 mg daily  We will schedule you for an echocardiogram.

## 2012-10-29 ENCOUNTER — Ambulatory Visit: Payer: BC Managed Care – PPO | Admitting: Emergency Medicine

## 2012-10-30 ENCOUNTER — Ambulatory Visit (HOSPITAL_COMMUNITY): Payer: BC Managed Care – PPO | Attending: Cardiovascular Disease | Admitting: Radiology

## 2012-10-30 DIAGNOSIS — E7800 Pure hypercholesterolemia, unspecified: Secondary | ICD-10-CM

## 2012-10-30 DIAGNOSIS — I4891 Unspecified atrial fibrillation: Secondary | ICD-10-CM | POA: Insufficient documentation

## 2012-10-30 DIAGNOSIS — I480 Paroxysmal atrial fibrillation: Secondary | ICD-10-CM

## 2012-10-30 NOTE — Progress Notes (Signed)
Echocardiogram performed.  

## 2012-11-26 ENCOUNTER — Other Ambulatory Visit: Payer: Self-pay | Admitting: Nurse Practitioner

## 2013-02-05 ENCOUNTER — Other Ambulatory Visit: Payer: Self-pay | Admitting: Dermatology

## 2013-03-11 ENCOUNTER — Encounter: Payer: BC Managed Care – PPO | Admitting: Emergency Medicine

## 2013-03-16 ENCOUNTER — Other Ambulatory Visit: Payer: Self-pay | Admitting: Emergency Medicine

## 2013-03-17 ENCOUNTER — Other Ambulatory Visit: Payer: Self-pay | Admitting: Physician Assistant

## 2013-03-19 ENCOUNTER — Other Ambulatory Visit: Payer: Self-pay

## 2013-03-19 NOTE — Telephone Encounter (Signed)
Meds ordered this encounter  Medications  . Testosterone (AXIRON) 30 MG/ACT SOLN    Sig: Place 2 Act onto the skin daily. Need office visit for additional refills.    Dispense:  90 mL    Refill:  0   Patient needs OV with Dr. Cleta Alberts for additional refills

## 2013-03-24 ENCOUNTER — Other Ambulatory Visit: Payer: Self-pay | Admitting: Nurse Practitioner

## 2013-04-22 ENCOUNTER — Encounter: Payer: Self-pay | Admitting: Emergency Medicine

## 2013-04-22 ENCOUNTER — Ambulatory Visit (INDEPENDENT_AMBULATORY_CARE_PROVIDER_SITE_OTHER): Payer: BC Managed Care – PPO | Admitting: Emergency Medicine

## 2013-04-22 VITALS — BP 108/80 | HR 90 | Temp 98.6°F | Resp 16 | Ht 71.5 in | Wt 192.8 lb

## 2013-04-22 DIAGNOSIS — E785 Hyperlipidemia, unspecified: Secondary | ICD-10-CM

## 2013-04-22 DIAGNOSIS — Z23 Encounter for immunization: Secondary | ICD-10-CM

## 2013-04-22 DIAGNOSIS — E291 Testicular hypofunction: Secondary | ICD-10-CM

## 2013-04-22 DIAGNOSIS — Z0000 Encounter for general adult medical examination without abnormal findings: Secondary | ICD-10-CM

## 2013-04-22 DIAGNOSIS — N529 Male erectile dysfunction, unspecified: Secondary | ICD-10-CM

## 2013-04-22 LAB — LIPID PANEL
Cholesterol: 153 mg/dL (ref 0–200)
HDL: 40 mg/dL (ref >39)
LDL Cholesterol: 94 mg/dL (ref 0–99)
Total CHOL/HDL Ratio: 3.8 Ratio
Triglycerides: 95 mg/dL (ref <150)
VLDL: 19 mg/dL (ref 0–40)

## 2013-04-22 LAB — POCT URINALYSIS DIPSTICK
Bilirubin, UA: NEGATIVE
Blood, UA: NEGATIVE
Glucose, UA: NEGATIVE
Ketones, UA: NEGATIVE
Leukocytes, UA: NEGATIVE
Nitrite, UA: NEGATIVE
Protein, UA: NEGATIVE
Spec Grav, UA: 1.01
Urobilinogen, UA: 0.2
pH, UA: 6.5

## 2013-04-22 LAB — CBC WITH DIFFERENTIAL/PLATELET
Basophils Absolute: 0 10*3/uL (ref 0.0–0.1)
Basophils Relative: 0 % (ref 0–1)
Eosinophils Absolute: 0.2 10*3/uL (ref 0.0–0.7)
Eosinophils Relative: 2 % (ref 0–5)
HCT: 47.2 % (ref 39.0–52.0)
Hemoglobin: 16.6 g/dL (ref 13.0–17.0)
Lymphocytes Relative: 14 % (ref 12–46)
Lymphs Abs: 1.3 10*3/uL (ref 0.7–4.0)
MCH: 29 pg (ref 26.0–34.0)
MCHC: 35.2 g/dL (ref 30.0–36.0)
MCV: 82.5 fL (ref 78.0–100.0)
Monocytes Absolute: 0.9 10*3/uL (ref 0.1–1.0)
Monocytes Relative: 9 % (ref 3–12)
Neutro Abs: 7 10*3/uL (ref 1.7–7.7)
Neutrophils Relative %: 75 % (ref 43–77)
Platelets: 136 10*3/uL — ABNORMAL LOW (ref 150–400)
RBC: 5.72 MIL/uL (ref 4.22–5.81)
RDW: 13.2 % (ref 11.5–15.5)
WBC: 9.3 10*3/uL (ref 4.0–10.5)

## 2013-04-22 LAB — COMPREHENSIVE METABOLIC PANEL
ALT: 38 U/L (ref 0–53)
AST: 32 U/L (ref 0–37)
Albumin: 4.5 g/dL (ref 3.5–5.2)
Alkaline Phosphatase: 62 U/L (ref 39–117)
BUN: 15 mg/dL (ref 6–23)
CO2: 27 mEq/L (ref 19–32)
Calcium: 9.5 mg/dL (ref 8.4–10.5)
Chloride: 105 mEq/L (ref 96–112)
Creat: 0.82 mg/dL (ref 0.50–1.35)
Glucose, Bld: 84 mg/dL (ref 70–99)
Potassium: 3.9 mEq/L (ref 3.5–5.3)
Sodium: 141 mEq/L (ref 135–145)
Total Bilirubin: 0.8 mg/dL (ref 0.3–1.2)
Total Protein: 7.1 g/dL (ref 6.0–8.3)

## 2013-04-22 LAB — TSH: TSH: 1.676 u[IU]/mL (ref 0.350–4.500)

## 2013-04-22 LAB — IFOBT (OCCULT BLOOD): IFOBT: NEGATIVE

## 2013-04-22 LAB — PSA: PSA: 1.02 ng/mL (ref <=4.00)

## 2013-04-22 MED ORDER — TESTOSTERONE 30 MG/ACT TD SOLN: 2.0000 | Freq: Every day | TRANSDERMAL | Status: DC

## 2013-04-22 MED ORDER — TADALAFIL 20 MG PO TABS: 10.0000 mg | ORAL_TABLET | ORAL | Status: DC | PRN

## 2013-04-22 NOTE — Progress Notes (Signed)
@[facility] @  Patient ID: TAVEON ENYEART MRN: [id number], DOB: [birth year] 57 y.o. Date of Encounter: 04/22/2013, 8:14 AM  Primary Physician: Lucilla Edin, MD  Chief Complaint: Physical (CPE)  HPI: 57 y.o. y/o male with history noted below here for CPE.  Doing well. No issues/complaints.  Review of Systems:  Consitutional: No fever, chills, fatigue, night sweats, lymphadenopathy, or weight changes. Eyes: No visual changes, eye redness, or discharge. ENT/Mouth: Ears: No otalgia, tinnitus, hearing loss, discharge. Nose: No congestion, rhinorrhea, sinus pain, or epistaxis. Throat: No sore throat, post nasal drip, or teeth pain. Cardiovascular: No CP, palpitations, diaphoresis, DOE, edema, orthopnea, PND. he does have a history of atrial fibrillation he sees Dr. Swaziland for this. He did rarely has episodes. He is currently on a beta blocker to Respiratory: No cough, hemoptysis, SOB, or wheezing. Gastrointestinal: No anorexia, dysphagia, reflux, pain, nausea, vomiting, hematemesis, diarrhea, constipation, BRBPR, or melena. Genitourinary: No dysuria, frequency, urgency, hematuria, incontinence, nocturia, decreased urinary stream, discharge, , or testicular pain/masses.  He does have ED and takes Viagra but would like to try something different Musculoskeletal: No decreased ROM, myalgias, stiffness, joint swelling, or weakness. Skin: No rash, erythema, lesion changes, pain, warmth, jaundice, or pruritis. Neurological: No headache, dizziness, syncope, seizures, tremors, memory loss, coordination problems, or paresthesias. Psychological: No anxiety, depression, hallucinations, SI/HI. Endocrine: No fatigue, polydipsia, polyphagia, polyuria, or known diabetes. All other systems were reviewed and are otherwise negative.  Past Medical History  Diagnosis Date  . Abdominal pain   . Diverticular disease   . Diverticulitis   . GERD (gastroesophageal reflux disease)   . Chest pain     negative stress  echo in September 2013     Past Surgical History  Procedure Laterality Date  . Partial colectomy  11/11    Home Meds:  Prior to Admission medications   Medication Sig Start Date End Date Taking? Authorizing Provider  ALFALFA PO Take by mouth. Shaklee Alfalfa tabs 2.85 g alfalfa powder, 300 mg calcium   Yes Historical Provider, MD  Ascorbic Acid (VITAMIN C PO) Take 500 mg by mouth daily.   Yes Historical Provider, MD  aspirin 81 MG tablet Take 81 mg by mouth daily.   Yes Historical Provider, MD  B Complex Vitamins (B COMPLEX PO) Take by mouth. With folic acid   Yes Historical Provider, MD  cholecalciferol (VITAMIN D) 1000 UNITS tablet Take 1,000 Units by mouth 2 (two) times daily.   Yes Historical Provider, MD  GARLIC PO Take 1 g by mouth.   Yes Historical Provider, MD  metoprolol tartrate (LOPRESSOR) 25 MG tablet TAKE ONE TABLET BY MOUTH TWICE DAILY 03/24/13  Yes Peter M Swaziland, MD  Multiple Vitamin (MULTIVITAMIN) tablet Take 1 tablet by mouth daily.   Yes Historical Provider, MD  Omega-3 Fatty Acids (OMEGA 3 PO) Take by mouth.   Yes Historical Provider, MD  omeprazole (PRILOSEC) 20 MG capsule Take 1 capsule (20 mg total) by mouth daily. 03/05/12  Yes Collene Gobble, MD  tacrolimus (PROTOPIC) 0.1 % ointment Apply topically 2 (two) times daily.   Yes Historical Provider, MD  Testosterone (AXIRON) 30 MG/ACT SOLN Place 2 Act onto the skin daily. Need office visit for additional refills. 03/17/13  Yes Chelle Tessa Lerner, PA-C  VIAGRA 100 MG tablet TAKE ONE TABLET BY MOUTH DAILY AS NEEDED 03/16/13  Yes Nelva Nay, PA-C    Allergies:  Allergies  Allergen Reactions  . Penicillins     rash    History  Social History  . Marital Status: Married    Spouse Name: N/A    Number of Children: 0  . Years of Education: N/A   Occupational History  . attorney    Social History Main Topics  . Smoking status: Never Smoker   . Smokeless tobacco: Not on file  . Alcohol Use: No  . Drug Use: No   . Sexual Activity: Yes   Other Topics Concern  . Not on file   Social History Narrative  . No narrative on file    No family history on file.  Physical Exam:  Blood pressure 108/80, pulse 90, temperature 98.6 F (37 C), temperature source Oral, resp. rate 16, height 5' 11.5" (1.816 m), weight 192 lb 12.8 oz (87.454 kg), SpO2 96.00%.  General: Well developed, well nourished, in no acute distress. HEENT: Normocephalic, atraumatic. Conjunctiva pink, sclera non-icteric. Pupils 2 mm constricting to 1 mm, round, regular, and equally reactive to light and accomodation. EOMI. Internal auditory canal clear. TMs with good cone of light and without pathology. Nasal mucosa pink. Nares are without discharge. No sinus tenderness. Oral mucosa pink. Dentition . Pharynx without exudate.   Neck: Supple. Trachea midline. No thyromegaly. Full ROM. No lymphadenopathy. Lungs: Clear to auscultation bilaterally without wheezes, rales, or rhonchi. Breathing is of normal effort and unlabored. Cardiovascular: RRR with S1 S2. No murmurs, rubs, or gallops appreciated. Distal pulses 2+ symmetrically. No carotid or abdominal bruits.  Abdomen: Soft, non-tender, non-distended with normoactive bowel sounds. No hepatosplenomegaly or masses. No rebound/guarding. No CVA tenderness. Without hernias. There is a midline scar just below the umbilicus Rectal: No external hemorrhoids or fissures. Rectal vault without masses.   Genitourinary:   circumcised male. No penile lesions. Testes descended bilaterally, and smooth without tenderness or masses. Testicles are atrophic bilaterally the Musculoskeletal: Full range of motion and 5/5 strength throughout. Without swelling, atrophy, tenderness, crepitus, or warmth. Extremities without clubbing, cyanosis, or edema. Calves supple. Skin: Warm and moist without erythema, ecchymosis, wounds, or rash. Neuro: A+Ox3. CN II-XII grossly intact. Moves all extremities spontaneously. Full  sensation throughout. Normal gait. DTR 2+ throughout upper and lower extremities. Finger to nose intact. Psych:  Responds to questions appropriately with a normal affect.   Studies: CBC, CMET, Lipid, PSA, TSH,        Assessment/Plan:  57 y.o. y/o enters for physical exam  -  Signed, Earl Lites, MD 04/22/2013 8:14 AM

## 2013-04-23 ENCOUNTER — Encounter: Payer: Self-pay | Admitting: Family Medicine

## 2013-04-23 LAB — TESTOSTERONE, FREE, TOTAL, SHBG
Sex Hormone Binding: 22 nmol/L (ref 13–71)
Testosterone, Free: 105.1 pg/mL (ref 47.0–244.0)
Testosterone-% Free: 2.5 % (ref 1.6–2.9)
Testosterone: 416 ng/dL (ref 300–890)

## 2013-04-23 LAB — VITAMIN D 25 HYDROXY (VIT D DEFICIENCY, FRACTURES): Vit D, 25-Hydroxy: 63 ng/mL (ref 30–89)

## 2013-04-23 MED ORDER — ZOSTER VACCINE LIVE 19400 UNT/0.65ML ~~LOC~~ SOLR: 0.6500 mL | Freq: Once | SUBCUTANEOUS | Status: DC

## 2013-04-23 NOTE — Addendum Note (Signed)
Addended by: Johnnette Litter on: 04/23/2013 03:02 PM   Modules accepted: Orders

## 2013-04-24 ENCOUNTER — Encounter: Payer: Self-pay | Admitting: Cardiology

## 2013-04-24 ENCOUNTER — Ambulatory Visit (INDEPENDENT_AMBULATORY_CARE_PROVIDER_SITE_OTHER): Payer: BC Managed Care – PPO | Admitting: Cardiology

## 2013-04-24 VITALS — BP 110/72 | HR 72 | Ht 71.5 in | Wt 195.0 lb

## 2013-04-24 DIAGNOSIS — E7800 Pure hypercholesterolemia, unspecified: Secondary | ICD-10-CM

## 2013-04-24 DIAGNOSIS — I480 Paroxysmal atrial fibrillation: Secondary | ICD-10-CM

## 2013-04-24 DIAGNOSIS — I4891 Unspecified atrial fibrillation: Secondary | ICD-10-CM

## 2013-04-24 MED ORDER — DILTIAZEM HCL ER COATED BEADS 180 MG PO CP24: 180.0000 mg | ORAL_CAPSULE | Freq: Every day | ORAL | Status: DC

## 2013-04-24 NOTE — Progress Notes (Signed)
Nicholas West Date of Birth: [birth year] Medical Record #[id number]  History of Present Illness: Nicholas West is seen back today for a followup visit.  He has a history of paroxysmal atrial fibrillation. He has been managed with rate control with metoprolol. He has a [place] and [place] vascular score of 0. He is taking a baby aspirin daily. On followup today he complains of persistent low energy on metoprolol. He's had a few episodes of palpitations particularly in the early morning hours. These occur more when he is under periods of stress and not sleeping well. He describes his symptoms as minor.  Current Outpatient Prescriptions on File Prior to Visit  Medication Sig Dispense Refill  . ALFALFA PO Take by mouth. Shaklee Alfalfa tabs 2.85 g alfalfa powder, 300 mg calcium      . Ascorbic Acid (VITAMIN C PO) Take 500 mg by mouth daily.      Marland Kitchen aspirin 81 MG tablet Take 81 mg by mouth daily.      . B Complex Vitamins (B COMPLEX PO) Take by mouth. With folic acid      . cholecalciferol (VITAMIN D) 1000 UNITS tablet Take 1,000 Units by mouth 2 (two) times daily.      Marland Kitchen GARLIC PO Take 1 g by mouth.      . Multiple Vitamin (MULTIVITAMIN) tablet Take 1 tablet by mouth daily.      . Omega-3 Fatty Acids (OMEGA 3 PO) Take by mouth.      Marland Kitchen omeprazole (PRILOSEC) 20 MG capsule Take 1 capsule (20 mg total) by mouth daily.  30 capsule  11  . tacrolimus (PROTOPIC) 0.1 % ointment Apply topically 2 (two) times daily.      . tadalafil (CIALIS) 20 MG tablet Take 0.5-1 tablets (10-20 mg total) by mouth every other day as needed for erectile dysfunction.  5 tablet  11  . Testosterone (AXIRON) 30 MG/ACT SOLN Place 2 Act onto the skin daily. Need office visit for additional refills.  90 mL  5  . zoster vaccine live, PF, (ZOSTAVAX) 40981 UNT/0.65ML injection Inject 19,400 Units into the skin once.  1 each  0   No current facility-administered medications on file prior to visit.    Allergies  Allergen Reactions  .  Penicillins     rash    Past Medical History  Diagnosis Date  . Abdominal pain   . Diverticular disease   . Diverticulitis   . GERD (gastroesophageal reflux disease)   . Chest pain     negative stress echo in September 2013  . Atrial fibrillation     Past Surgical History  Procedure Laterality Date  . Partial colectomy  11/11  . Colon surgery      History  Smoking status  . Never Smoker   Smokeless tobacco  . Not on file    History  Alcohol Use No    Family History  Problem Relation Age of Onset  . Cancer Mother     Review of Systems: The review of systems is per the HPI.  All other systems were reviewed and are negative.  Physical Exam: BP 110/72  Pulse 72  Ht 5' 11.5" (1.816 m)  Wt 88.451 kg (195 lb)  BMI 26.82 kg/m2 Patient is very pleasant and in no acute distress. Skin is warm and dry. Color is normal.  HEENT is unremarkable. Normocephalic/atraumatic. PERRL. Sclera are nonicteric. Neck is supple. No masses. No JVD. Lungs are clear. Cardiac exam shows a regular rate and  rhythm. Abdomen is soft. Extremities are without edema. Gait and ROM are intact. No gross neurologic deficits noted.  LABORATORY DATA: Pending  Lab Results  Component Value Date   WBC 9.3 04/22/2013   HGB 16.6 04/22/2013   HCT 47.2 04/22/2013   PLT 136* 04/22/2013   GLUCOSE 84 04/22/2013   CHOL 153 04/22/2013   TRIG 95 04/22/2013   HDL 40 04/22/2013   LDLCALC 94 04/22/2013   ALT 38 04/22/2013   AST 32 04/22/2013   NA 141 04/22/2013   K 3.9 04/22/2013   CL 105 04/22/2013   CREATININE 0.82 04/22/2013   BUN 15 04/22/2013   CO2 27 04/22/2013   TSH 1.676 04/22/2013   PSA 1.02 04/22/2013      Assessment / Plan: 1. Paroxysmal atrial fibrillation. Patient is minimally symptomatic. He has a [place] and [place] vascular score of 0.  I recommended he take a baby aspirin daily. He has developed symptoms of fatigue on metoprolol. We will switch him to diltiazem 180 mg daily. Since his symptoms are fairly  minor I would recommend continued rate control strategy over rhythm control.  2. Mild HLD   3. Prior normal stress echo

## 2013-04-24 NOTE — Patient Instructions (Signed)
Stop metoprolol.    Start Cardizem DC (diltiazem) 180 mg daily.  I will see you in 6 months.

## 2013-06-04 ENCOUNTER — Emergency Department (HOSPITAL_COMMUNITY)
Admission: EM | Admit: 2013-06-04 | Discharge: 2013-06-04 | Disposition: A | Discharge status: Home / Self Care | Payer: BC Managed Care – PPO | Attending: Emergency Medicine | Admitting: Emergency Medicine

## 2013-06-04 ENCOUNTER — Ambulatory Visit: Payer: BC Managed Care – PPO

## 2013-06-04 ENCOUNTER — Emergency Department (HOSPITAL_COMMUNITY): Payer: BC Managed Care – PPO

## 2013-06-04 ENCOUNTER — Ambulatory Visit (INDEPENDENT_AMBULATORY_CARE_PROVIDER_SITE_OTHER): Payer: BC Managed Care – PPO | Admitting: Emergency Medicine

## 2013-06-04 ENCOUNTER — Encounter (HOSPITAL_COMMUNITY): Payer: Self-pay | Admitting: Emergency Medicine

## 2013-06-04 VITALS — BP 110/58 | HR 83 | Temp 98.0°F | Resp 16 | Ht 71.5 in | Wt 198.6 lb

## 2013-06-04 DIAGNOSIS — R918 Other nonspecific abnormal finding of lung field: Secondary | ICD-10-CM

## 2013-06-04 DIAGNOSIS — I4891 Unspecified atrial fibrillation: Secondary | ICD-10-CM

## 2013-06-04 DIAGNOSIS — R091 Pleurisy: Secondary | ICD-10-CM

## 2013-06-04 DIAGNOSIS — R079 Chest pain, unspecified: Secondary | ICD-10-CM

## 2013-06-04 DIAGNOSIS — R0602 Shortness of breath: Secondary | ICD-10-CM | POA: Insufficient documentation

## 2013-06-04 DIAGNOSIS — K219 Gastro-esophageal reflux disease without esophagitis: Secondary | ICD-10-CM | POA: Insufficient documentation

## 2013-06-04 DIAGNOSIS — Z880 Allergy status to penicillin: Secondary | ICD-10-CM | POA: Insufficient documentation

## 2013-06-04 DIAGNOSIS — Z7982 Long term (current) use of aspirin: Secondary | ICD-10-CM | POA: Insufficient documentation

## 2013-06-04 DIAGNOSIS — Z79899 Other long term (current) drug therapy: Secondary | ICD-10-CM | POA: Insufficient documentation

## 2013-06-04 DIAGNOSIS — R9389 Abnormal findings on diagnostic imaging of other specified body structures: Secondary | ICD-10-CM

## 2013-06-04 LAB — TROPONIN I
Troponin I: <0.3 ng/mL (ref <0.30)
Troponin I: <0.3 ng/mL (ref <0.30)

## 2013-06-04 LAB — CBC WITH DIFFERENTIAL/PLATELET
Basophils Absolute: 0 10*3/uL (ref 0.0–0.1)
Basophils Relative: 0 % (ref 0–1)
Eosinophils Absolute: 0.1 10*3/uL (ref 0.0–0.7)
Eosinophils Relative: 1 % (ref 0–5)
HCT: 46.2 % (ref 39.0–52.0)
Hemoglobin: 16.8 g/dL (ref 13.0–17.0)
Lymphocytes Relative: 28 % (ref 12–46)
Lymphs Abs: 2 10*3/uL (ref 0.7–4.0)
MCH: 30.2 pg (ref 26.0–34.0)
MCHC: 36.4 g/dL — ABNORMAL HIGH (ref 30.0–36.0)
MCV: 83.1 fL (ref 78.0–100.0)
Monocytes Absolute: 0.7 10*3/uL (ref 0.1–1.0)
Monocytes Relative: 10 % (ref 3–12)
Neutro Abs: 4.3 10*3/uL (ref 1.7–7.7)
Neutrophils Relative %: 61 % (ref 43–77)
Platelets: 136 10*3/uL — ABNORMAL LOW (ref 150–400)
RBC: 5.56 MIL/uL (ref 4.22–5.81)
RDW: 13 % (ref 11.5–15.5)
WBC: 7 10*3/uL (ref 4.0–10.5)

## 2013-06-04 LAB — URINALYSIS, ROUTINE W REFLEX MICROSCOPIC
Bilirubin Urine: NEGATIVE
Glucose, UA: NEGATIVE mg/dL
Hgb urine dipstick: NEGATIVE
Ketones, ur: NEGATIVE mg/dL
Leukocytes, UA: NEGATIVE
Nitrite: NEGATIVE
Protein, ur: NEGATIVE mg/dL
Specific Gravity, Urine: 1.009 (ref 1.005–1.030)
Urobilinogen, UA: 0.2 mg/dL (ref 0.0–1.0)
pH: 7 (ref 5.0–8.0)

## 2013-06-04 LAB — COMPREHENSIVE METABOLIC PANEL
ALT: 28 U/L (ref 0–53)
AST: 30 U/L (ref 0–37)
Albumin: 4.6 g/dL (ref 3.5–5.2)
Alkaline Phosphatase: 67 U/L (ref 39–117)
BUN: 16 mg/dL (ref 6–23)
CO2: 27 mEq/L (ref 19–32)
Calcium: 9.7 mg/dL (ref 8.4–10.5)
Chloride: 102 mEq/L (ref 96–112)
Creatinine, Ser: 0.82 mg/dL (ref 0.50–1.35)
GFR calc Af Amer: >90 mL/min (ref >90)
GFR calc non Af Amer: >90 mL/min (ref >90)
Glucose, Bld: 83 mg/dL (ref 70–99)
Potassium: 3.8 mEq/L (ref 3.5–5.1)
Sodium: 139 mEq/L (ref 135–145)
Total Bilirubin: 0.6 mg/dL (ref 0.3–1.2)
Total Protein: 7.6 g/dL (ref 6.0–8.3)

## 2013-06-04 LAB — POCT CBC
Granulocyte percent: 60.7 %G (ref 37–80)
HCT, POC: 45.8 % (ref 43.5–53.7)
Hemoglobin: 14.9 g/dL (ref 14.1–18.1)
Lymph, poc: 1.6 (ref 0.6–3.4)
MCH, POC: 29.6 pg (ref 27–31.2)
MCHC: 32.5 g/dL (ref 31.8–35.4)
MCV: 90.8 fL (ref 80–97)
MID (cbc): 0.4 (ref 0–0.9)
MPV: 8.4 fL (ref 0–99.8)
POC Granulocyte: 3.1 (ref 2–6.9)
POC LYMPH PERCENT: 31 %L (ref 10–50)
POC MID %: 8.3 %M (ref 0–12)
Platelet Count, POC: 158 10*3/uL (ref 142–424)
RBC: 5.04 M/uL (ref 4.69–6.13)
RDW, POC: 14.1 %
WBC: 5.1 10*3/uL (ref 4.6–10.2)

## 2013-06-04 LAB — D-DIMER, QUANTITATIVE (NOT AT ARMC): D-Dimer, Quant: <0.27 ug/mL-FEU (ref 0.00–0.48)

## 2013-06-04 LAB — PROTIME-INR
INR: 1.1 (ref 0.00–1.49)
Prothrombin Time: 14 seconds (ref 11.6–15.2)

## 2013-06-04 IMAGING — CT CT ANGIO CHEST
2 of 7 series · 18 of 36 positions shown · IV contrast (CONTRAST)
Comparison: Chest radiograph, [DATE].

CLINICAL DATA: Chest pain.

EXAM:
CT ANGIOGRAPHY CHEST WITH CONTRAST
TECHNIQUE: Multidetector CT imaging of the chest was performed using the
standard protocol during bolus administration of intravenous
contrast. Multiplanar CT image reconstructions including MIPs were
obtained to evaluate the vascular anatomy.
CONTRAST:  100mL OMNIPAQUE IOHEXOL 350 MG/ML SOLN

[Series 5: pe thins · axial · 0.64mm/px · z∈[+62,+363]mm · 17 of 339 slices shown]
[im 19/339  lung]
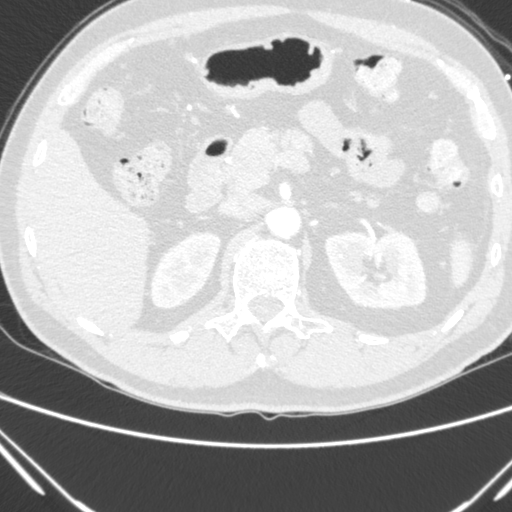
[im 38/339  mediastinal]
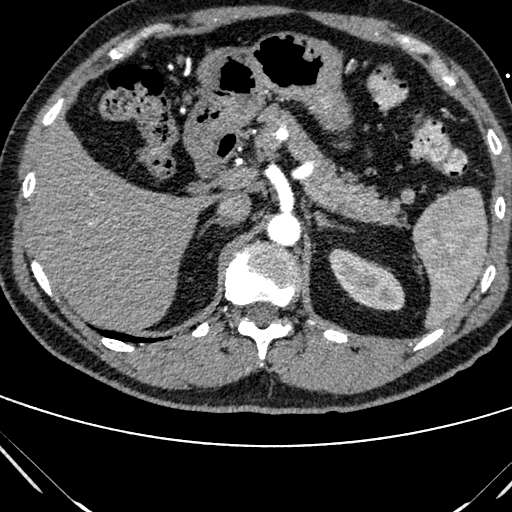
[im 57/339  lung]
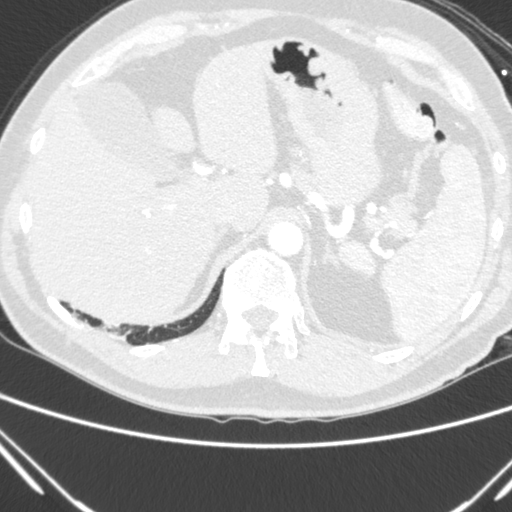
[im 76/339  mediastinal]
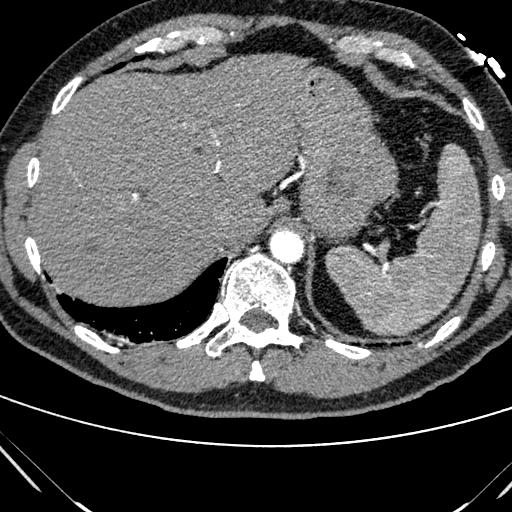
[im 94/339  lung]
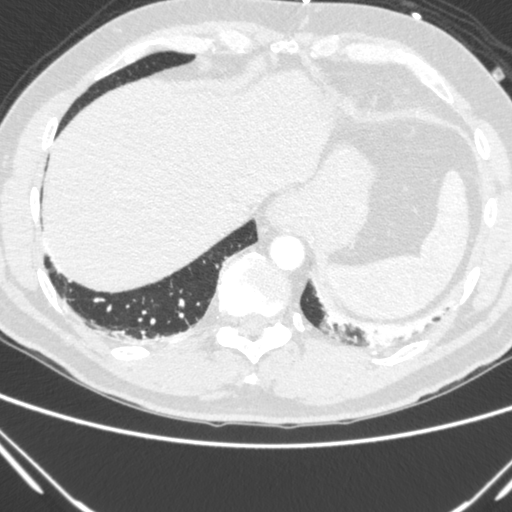
[im 113/339  mediastinal]
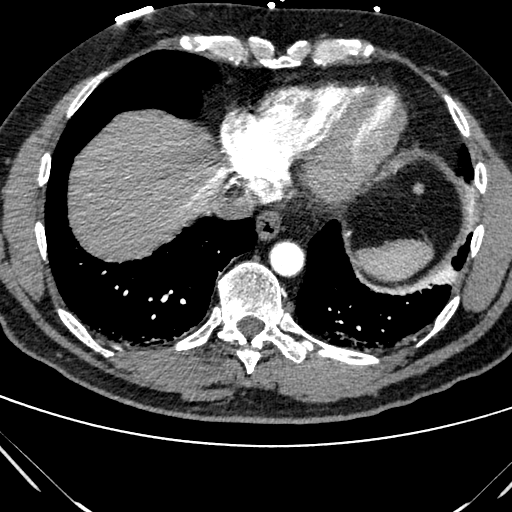
[im 132/339  lung]
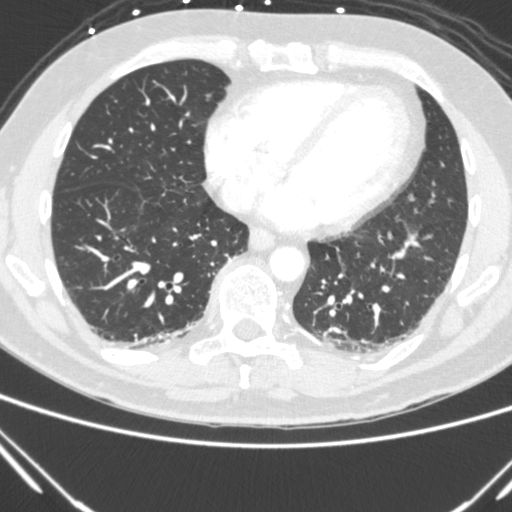
[im 151/339  mediastinal]
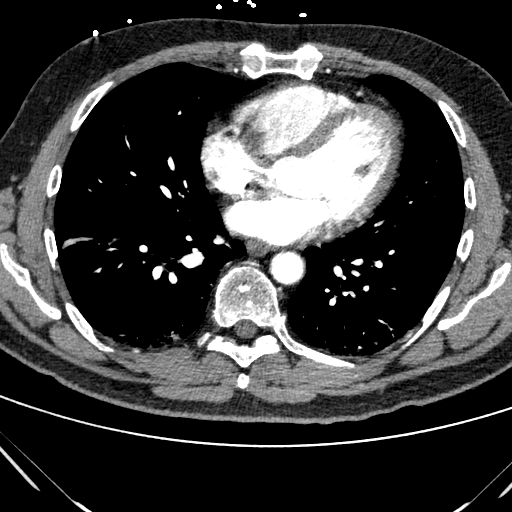
[im 170/339  lung]
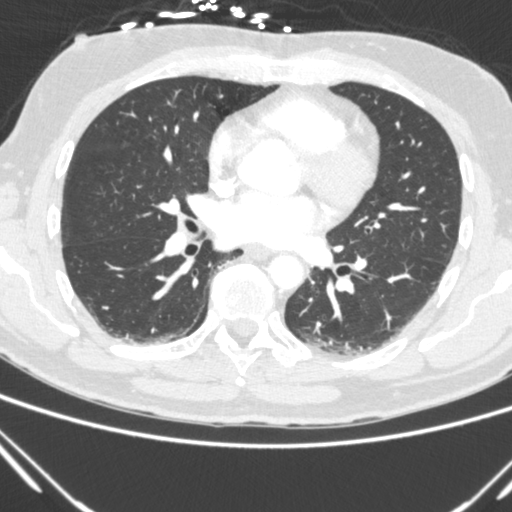
[im 188/339  mediastinal]
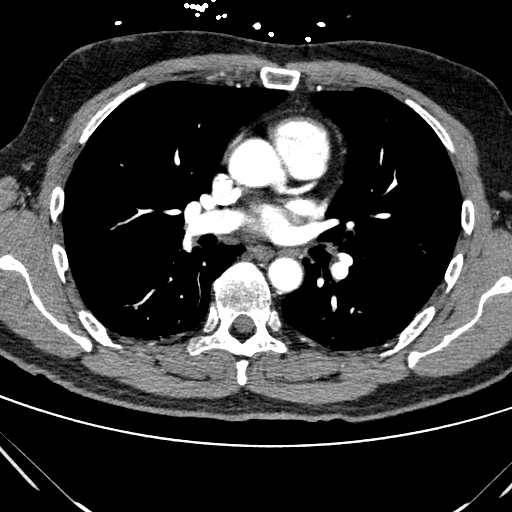
[im 207/339  lung]
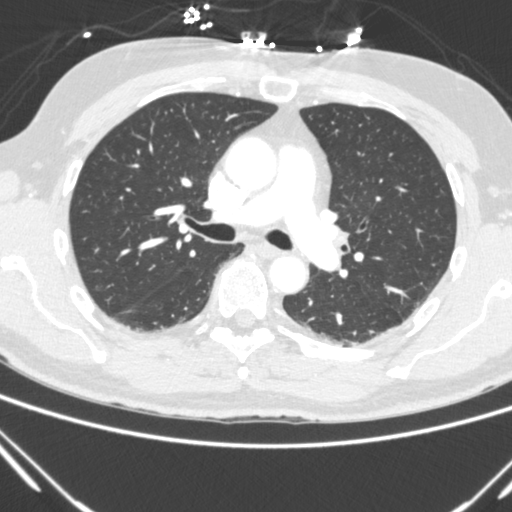
[im 226/339  mediastinal]
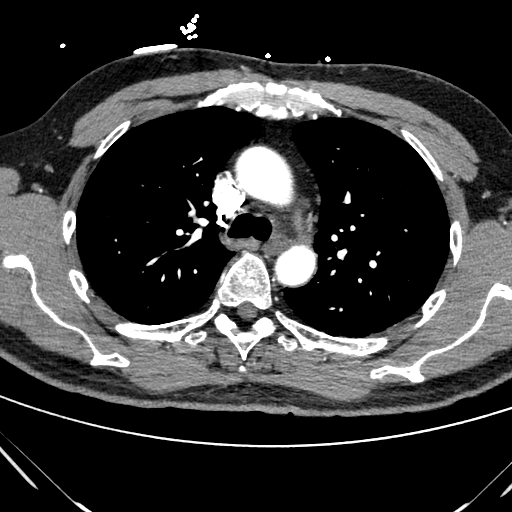
[im 245/339  lung]
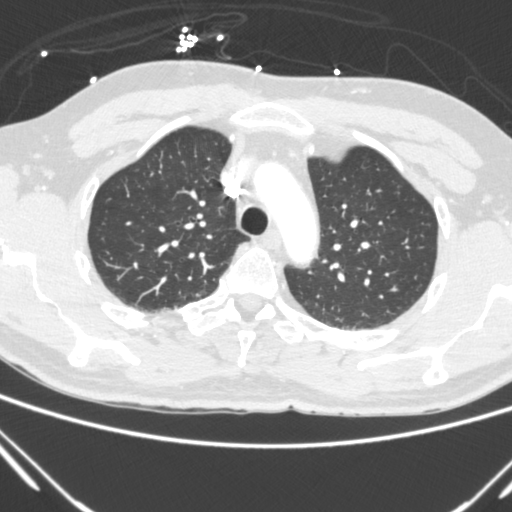
[im 263/339  mediastinal]
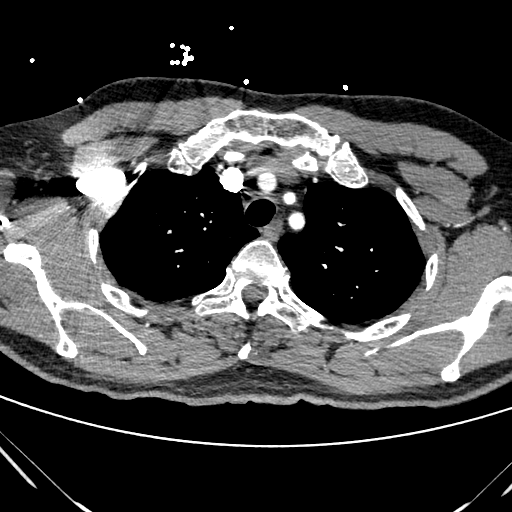
[im 282/339  lung]
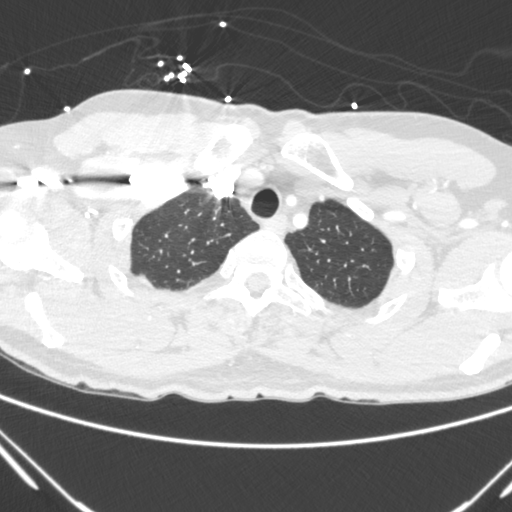
[im 301/339  mediastinal]
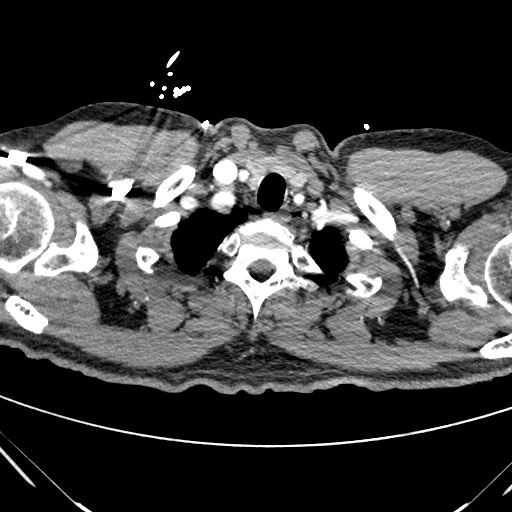
[im 320/339  lung]
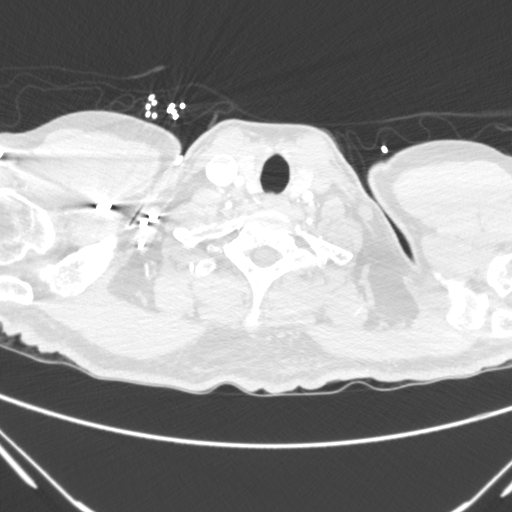

[cor · coronal · 0.66mm/px · 1 of 117 slices shown]
[im 59/117  mediastinal]
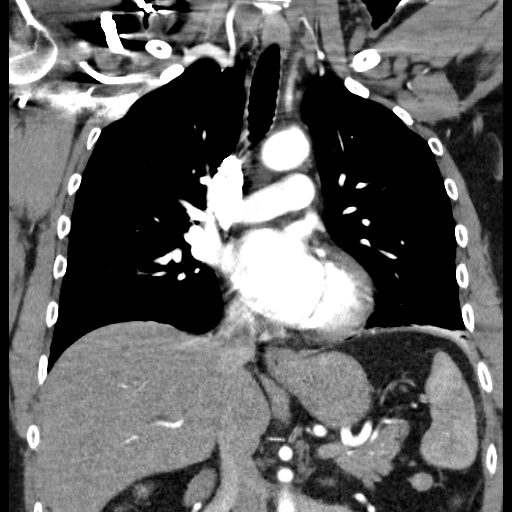

[18 of 36 positions shown; findings below may reference images not displayed]

FINDINGS: No evidence of a pulmonary embolism.

The heart is normal in size and configuration. The great vessels are
normal in caliber. No neck base masses or adenopathy are present.
There is no mediastinal or hilar adenopathy.

Mild dependent lower lobe subsegmental atelectasis. The lungs are
otherwise clear. No pleural effusion or pneumothorax.

Low-density lesion in the posterior segment of the right lobe of the
liver. Small hiatal hernia. This is most likely a cyst. Limited
visualization of the upper abdomen is otherwise unremarkable.

Mild degenerative changes are noted throughout the visualized spine.
No osteoblastic or osteolytic lesions.

Review of the MIP images confirms the above findings.
IMPRESSION: 1. No evidence of a pulmonary embolism.
2. Dependent lower lobe subsegmental atelectasis, mild. The lungs
are otherwise clear.
3. No acute findings.

## 2013-06-04 MED ORDER — IOHEXOL 350 MG/ML SOLN
100.0000 mL | Freq: Once | INTRAVENOUS | Status: AC | PRN
  Administered 2013-06-04: 100 mL via INTRAVENOUS

## 2013-06-04 MED ORDER — IBUPROFEN 800 MG PO TABS: 800.0000 mg | ORAL_TABLET | Freq: Three times a day (TID) | ORAL | Status: DC

## 2013-06-04 MED ORDER — HYDROCODONE-ACETAMINOPHEN 5-325 MG PO TABS: 2.0000 | ORAL_TABLET | ORAL | Status: DC | PRN

## 2013-06-04 NOTE — Progress Notes (Addendum)
Subjective:    Patient ID: Nicholas West, male    DOB: 1956-03-06, 57 y.o.   MRN: [id number]  Chest Pain  Pertinent negatives include no abdominal pain, back pain, cough, palpitations or shortness of breath.   This chart was scribed for Viviann Spare Estefani Bateson-MD, by Ladona Ridgel Day, Scribe. This patient was seen in room 1 and the patient's care was started at 9:30 AM.  HPI Comments: Nicholas West is a 57 y.o. male who has a hx of Atrial fibrillation, dx 1 year ago.   Today he presents to the [facility] complaining of gradual onset, gradually worsening 6/10 left lateral chest/rib pain, onset day and a half ago. He reports onset while at rest on the couch at home and states his pain is worsened with some movements, deep breath or sneezing/coughing. He denies any associated SOB, leg pain/swelling but did c/o mild SOB on exertion from walking. He denies any trauma or blunt injury to his chest. He denies any overlying skin rash to his torso. He states that he has been feeling fatigued over the past day and does not feel himself. He states that he can sometimes tell when he is in A-fib.  He states a 2 hour car ride to the mountains this weekend and denies any other long travel or air travel. He denies any calf tenderness/pain/swelling.   He went to see the cardiologist 9/25 for checkup of his A-fib. He states everything checked out normal and had no medical complaints at that time.  He takes ASA.   Past Medical History  Diagnosis Date  . Abdominal pain   . Diverticular disease   . Diverticulitis   . GERD (gastroesophageal reflux disease)   . Chest pain     negative stress echo in September 2013  . Atrial fibrillation     Past Surgical History  Procedure Laterality Date  . Partial colectomy  11/11  . Colon surgery      Family History  Problem Relation Age of Onset  . Cancer Mother     History   Social History  . Marital Status: Married    Spouse Name: N/A    Number of  Children: 0  . Years of Education: N/A   Occupational History  . attorney    Social History Main Topics  . Smoking status: Never Smoker   . Smokeless tobacco: Not on file  . Alcohol Use: No  . Drug Use: No  . Sexual Activity: Yes   Other Topics Concern  . Not on file   Social History Narrative  . No narrative on file    Allergies  Allergen Reactions  . Penicillins     rash    Patient Active Problem List   Diagnosis Date Noted  . Paroxysmal atrial fibrillation 10/24/2012  . Hypercholesterolemia 04/03/2012  . ED (erectile dysfunction) 03/05/2012  . Hypogonadism male 03/05/2012    Results for orders placed in visit on 04/22/13  CBC WITH DIFFERENTIAL      Result Value Range   WBC 9.3  4.0 - 10.5 K/uL   RBC 5.72  4.22 - 5.81 MIL/uL   Hemoglobin 16.6  13.0 - 17.0 g/dL   HCT 40.9  81.1 - 91.4 %   MCV 82.5  78.0 - 100.0 fL   MCH 29.0  26.0 - 34.0 pg   MCHC 35.2  30.0 - 36.0 g/dL   RDW 78.2  95.6 - 21.3 %   Platelets 136 (*) 150 - 400  K/uL   Neutrophils Relative % 75  43 - 77 %   Neutro Abs 7.0  1.7 - 7.7 K/uL   Lymphocytes Relative 14  12 - 46 %   Lymphs Abs 1.3  0.7 - 4.0 K/uL   Monocytes Relative 9  3 - 12 %   Monocytes Absolute 0.9  0.1 - 1.0 K/uL   Eosinophils Relative 2  0 - 5 %   Eosinophils Absolute 0.2  0.0 - 0.7 K/uL   Basophils Relative 0  0 - 1 %   Basophils Absolute 0.0  0.0 - 0.1 K/uL   Smear Review Criteria for review not met    COMPREHENSIVE METABOLIC PANEL      Result Value Range   Sodium 141  135 - 145 mEq/L   Potassium 3.9  3.5 - 5.3 mEq/L   Chloride 105  96 - 112 mEq/L   CO2 27  19 - 32 mEq/L   Glucose, Bld 84  70 - 99 mg/dL   BUN 15  6 - 23 mg/dL   Creat 1.61  0.96 - 0.45 mg/dL   Total Bilirubin 0.8  0.3 - 1.2 mg/dL   Alkaline Phosphatase 62  39 - 117 U/L   AST 32  0 - 37 U/L   ALT 38  0 - 53 U/L   Total Protein 7.1  6.0 - 8.3 g/dL   Albumin 4.5  3.5 - 5.2 g/dL   Calcium 9.5  8.4 - 40.9 mg/dL  TSH      Result Value Range   TSH  1.676  0.350 - 4.500 uIU/mL  TESTOSTERONE, FREE, TOTAL      Result Value Range   Testosterone 416  300 - 890 ng/dL   Sex Hormone Binding 22  13 - 71 nmol/L   Testosterone, Free 105.1  47.0 - 244.0 pg/mL   Testosterone-% Freee. 2.5  1.6 - 2.9 %  PSA      Result Value Range   PSA 1.02  <=4.00 ng/mL  LIPID PANEL      Result Value Range   Cholesterol 153  0 - 200 mg/dL   Triglycerides 95  <811 mg/dL   HDL 40  >91 mg/dL   Total CHOL/HDL Ratio 3.8     VLDL 19  0 - 40 mg/dL   LDL Cholesterol 94  0 - 99 mg/dL  VITAMIN D 25 HYDROXY      Result Value Range   Vit D, 25-Hydroxy 63  30 - 89 ng/mL  IFOBT (OCCULT BLOOD)      Result Value Range   IFOBT Negative    POCT URINALYSIS DIPSTICK      Result Value Range   Color, UA yellow     Clarity, UA clear     Glucose, UA neg     Bilirubin, UA neg     Ketones, UA neg     Spec Grav, UA 1.010     Blood, UA neg     pH, UA 6.5     Protein, UA neg     Urobilinogen, UA 0.2     Nitrite, UA neg     Leukocytes, UA Negative      1. Chest pain   2. Atrial fibrillation     No orders of the defined types were placed in this encounter.     Review of Systems  Constitutional: Positive for fatigue.  Respiratory: Negative for cough and shortness of breath.   Cardiovascular: Positive for chest pain (left lateral  chest pain/rib pain). Negative for palpitations and leg swelling.  Gastrointestinal: Negative for abdominal pain.  Musculoskeletal: Negative for back pain.   Triage Vitals: BP 110/58  Pulse 83  Temp(Src) 98 F (36.7 C) (Oral)  Resp 16  Ht 5' 11.5" (1.816 m)  Wt 198 lb 9.6 oz (90.084 kg)  BMI 27.32 kg/m2  SpO2 96%    Objective:   Physical Exam  Nursing note and vitals reviewed. Constitutional: He is oriented to person, place, and time. He appears well-developed and well-nourished. No distress.  HENT:  Head: Normocephalic and atraumatic.  Neck: Neck supple. No tracheal deviation present.  Cardiovascular: Normal rate.    Pulmonary/Chest: Effort normal. No respiratory distress.  Musculoskeletal: Normal range of motion.  Neurological: He is alert and oriented to person, place, and time.  Skin: Skin is warm and dry.  Psychiatric: He has a normal mood and affect. His behavior is normal.   with ambulation pulse ox stayed 9596  Ekg normal sinus rhythm  Results for orders placed in visit on 06/04/13  POCT CBC      Result Value Range   WBC 5.1  4.6 - 10.2 K/uL   Lymph, poc 1.6  0.6 - 3.4   POC LYMPH PERCENT 31.0  10 - 50 %L   MID (cbc) 0.4  0 - 0.9   POC MID % 8.3  0 - 12 %M   POC Granulocyte 3.1  2 - 6.9   Granulocyte percent 60.7  37 - 80 %G   RBC 5.04  4.69 - 6.13 M/uL   Hemoglobin 14.9  14.1 - 18.1 g/dL   HCT, POC 16.1  09.6 - 53.7 %   MCV 90.8  80 - 97 fL   MCH, POC 29.6  27 - 31.2 pg   MCHC 32.5  31.8 - 35.4 g/dL   RDW, POC 04.5     Platelet Count, POC 158  142 - 424 K/uL   MPV 8.4  0 - 99.8 fL  [facility] reading (PRIMARY) by  Dr. Cleta Alberts he appears to be a small amount of fluid at the left costophrenic angle.      Assessment & Plan:  With past history of proximal atrial fib and left pleuritic chest pain along with an abnormal chest x-ray PE is high on the list: Sent to [facility] For evaluation and possible CT angiogram .triage nurse was called.

## 2013-06-04 NOTE — ED Notes (Signed)
Per pt sts left sided rib pain since early Monday. sts some SOB. sts when he coughs, sneezes, moves it hurts. Sent here by physician to R/O PE. Hx of A fib

## 2013-06-04 NOTE — ED Provider Notes (Signed)
CSN: [id number]     Arrival date & time 06/04/13  1042 History   First MD Initiated Contact with Patient 06/04/13 1112     Chief Complaint  Patient presents with  . sent here by Dr. Eloy End PE    (Consider location/radiation/quality/duration/timing/severity/associated sxs/prior Treatment) HPI Comments: Patient sent from urgent care with a two-day history of left-sided rib pain that is worse with movement, coughing, sneezing and deep breathing. Urgent care concern for pulmonary embolism. Patient has a history of paroxysmal atrial fibrillation and is not on anticoagulation. He denies any falls or trauma. He endorses some shortness of breath. Denies any cough, fever, nausea, vomiting or abdominal pain. Denies any falls or trauma. Denies leg pain or swelling. He took a trip to the mountains last week. The couple weeks ago he experienced some burning in his chest that radiated to his jaw that resolved after a few minutes. He sees cardiology and had negative stress test a few years ago. He denies any exertional chest pain or anterior chest pain today.  Patient had a negative stress echo in September 2013.  The history is provided by the patient and the spouse.    Past Medical History  Diagnosis Date  . Abdominal pain   . Diverticular disease   . Diverticulitis   . GERD (gastroesophageal reflux disease)   . Chest pain     negative stress echo in September 2013  . Atrial fibrillation    Past Surgical History  Procedure Laterality Date  . Partial colectomy  11/11  . Colon surgery     Family History  Problem Relation Age of Onset  . Cancer Mother    History  Substance Use Topics  . Smoking status: Never Smoker   . Smokeless tobacco: Not on file  . Alcohol Use: No    Review of Systems  Constitutional: Negative for fever, activity change and appetite change.  Respiratory: Positive for chest tightness and shortness of breath. Negative for cough.   Cardiovascular: Positive for chest pain.  Negative for leg swelling.  Gastrointestinal: Negative for nausea, vomiting and abdominal pain.  Genitourinary: Negative for dysuria and hematuria.  Musculoskeletal: Negative for back pain.  Skin: Negative for rash.  Neurological: Negative for dizziness, weakness and headaches.  A complete 10 system review of systems was obtained and all systems are negative except as noted in the HPI and PMH.    Allergies  Penicillins  Home Medications   Current Outpatient Rx  Name  Route  Sig  Dispense  Refill  . ALFALFA PO   Oral   Take by mouth See admin instructions. Shaklee Alfalfa tabs 2.85 g alfalfa powder, 300 mg calcium         . Ascorbic Acid (VITAMIN C PO)   Oral   Take 500 mg by mouth daily.         Marland Kitchen aspirin 81 MG tablet   Oral   Take 81 mg by mouth daily.         . B Complex Vitamins (B COMPLEX PO)   Oral   Take 1 tablet by mouth daily. With folic acid         . calcium carbonate (OS-CAL - DOSED IN MG OF ELEMENTAL CALCIUM) 1250 MG tablet   Oral   Take 1 tablet by mouth daily with breakfast.         . cholecalciferol (VITAMIN D) 1000 UNITS tablet   Oral   Take 1,000 Units by mouth 2 (two) times daily.         Marland Kitchen  diltiazem (CARDIZEM CD) 180 MG 24 hr capsule   Oral   Take 1 capsule (180 mg total) by mouth daily.   90 capsule   3   . Garlic 1000 MG CAPS   Oral   Take 1,000 mg by mouth daily.         . Multiple Vitamin (MULTIVITAMIN) tablet   Oral   Take 1 tablet by mouth daily.         . Omega-3 Fatty Acids (OMEGA 3 PO)   Oral   Take 1 capsule by mouth daily.          Marland Kitchen omeprazole (PRILOSEC) 20 MG capsule   Oral   Take 1 capsule (20 mg total) by mouth daily.   30 capsule   11   . OVER THE COUNTER MEDICATION   Oral   Take 1 tablet by mouth daily. Immune support tablets         . OVER THE COUNTER MEDICATION   Oral   Take 1 tablet by mouth daily. Supplement for brain health         . tacrolimus (PROTOPIC) 0.1 % ointment   Topical    Apply 1 application topically daily.          . tadalafil (CIALIS) 20 MG tablet   Oral   Take 0.5-1 tablets (10-20 mg total) by mouth every other day as needed for erectile dysfunction.   5 tablet   11   . HYDROcodone-acetaminophen (NORCO/VICODIN) 5-325 MG per tablet   Oral   Take 2 tablets by mouth every 4 (four) hours as needed.   10 tablet   0   . ibuprofen (ADVIL,MOTRIN) 800 MG tablet   Oral   Take 1 tablet (800 mg total) by mouth 3 (three) times daily.   21 tablet   0    BP 127/73  Pulse 68  Temp(Src) 98 F (36.7 C)  Resp 19  SpO2 100% Physical Exam  Constitutional: He is oriented to person, place, and time. He appears well-developed and well-nourished. No distress.  HENT:  Head: Normocephalic and atraumatic.  Mouth/Throat: Oropharynx is clear and moist. No oropharyngeal exudate.  Eyes: Conjunctivae and EOM are normal. Pupils are equal, round, and reactive to light.  Neck: Normal range of motion.  Cardiovascular: Normal rate, regular rhythm and normal heart sounds.   No murmur heard. Pulmonary/Chest: Effort normal and breath sounds normal. No respiratory distress. He exhibits no tenderness.  No reproducible chest wall tenderness. No rash.  Abdominal: Soft. There is no tenderness. There is no rebound and no guarding.  Musculoskeletal: Normal range of motion. He exhibits no edema and no tenderness.  Neurological: He is alert and oriented to person, place, and time. No cranial nerve deficit. He exhibits normal muscle tone. Coordination normal.  Skin: Skin is warm.    ED Course  Procedures (including critical care time) Labs Review Labs Reviewed  CBC WITH DIFFERENTIAL - Abnormal; Notable for the following:    MCHC 36.4 (*)    Platelets 136 (*)    All other components within normal limits  TROPONIN I  D-DIMER, QUANTITATIVE  COMPREHENSIVE METABOLIC PANEL  PROTIME-INR  URINALYSIS, ROUTINE W REFLEX MICROSCOPIC  TROPONIN I   Imaging Review Dg Chest 2  View  06/04/2013   CLINICAL DATA:  Lateral chest and rib pain beginning approximately 1-1/2 days ago  EXAM: CHEST  2 VIEW  COMPARISON:  06/16/2010; 06/10/2010  FINDINGS: Grossly unchanged cardiac silhouette and mediastinal contours given slightly reduced lung  volumes. Interval development heterogeneous possible airspace opacities within the left lower lung. The right hemithorax is unchanged. No definite pleural effusion or pneumothorax. No evidence of edema. Grossly unchanged bones.  IMPRESSION: Decreased lung volumes with development of left lower lung heterogeneous possible airspace opacities, atelectasis versus infiltrate. A follow-up chest radiograph in 4 to 6 weeks after treatment is recommended to ensure resolution.   Electronically Signed   By: Simonne Come M.D.   On: 06/04/2013 10:49   Ct Angio Chest Pe W/cm &/or Wo Cm  06/04/2013   CLINICAL DATA:  Chest pain.  EXAM: CT ANGIOGRAPHY CHEST WITH CONTRAST  TECHNIQUE: Multidetector CT imaging of the chest was performed using the standard protocol during bolus administration of intravenous contrast. Multiplanar CT image reconstructions including MIPs were obtained to evaluate the vascular anatomy.  CONTRAST:  OMNIPAQUE IOHEXOL 350 MG/ML SOLN  COMPARISON:  Chest radiograph, 06/04/2013.  FINDINGS: No evidence of a pulmonary embolism.  The heart is normal in size and configuration. The great vessels are normal in caliber. No neck base masses or adenopathy are present. There is no mediastinal or hilar adenopathy.  Mild dependent lower lobe subsegmental atelectasis. The lungs are otherwise clear. No pleural effusion or pneumothorax.  Low-density lesion in the posterior segment of the right lobe of the liver. Small hiatal hernia. This is most likely a cyst. Limited visualization of the upper abdomen is otherwise unremarkable.  Mild degenerative changes are noted throughout the visualized spine. No osteoblastic or osteolytic lesions.  Review of the MIP images  confirms the above findings.  IMPRESSION: 1. No evidence of a pulmonary embolism. 2. Dependent lower lobe subsegmental atelectasis, mild. The lungs are otherwise clear. 3. No acute findings.   Electronically Signed   By: Amie Portland M.D.   On: 06/04/2013 15:45    EKG Interpretation     Ventricular Rate:  72 PR Interval:  178 QRS Duration: 83 QT Interval:  403 QTC Calculation: 441 R Axis:   46 Text Interpretation:  Sinus rhythm No significant change was found            MDM   1. Pleurisy    Pleuritic chest pain for the past 2 days with history of atrial fibrillation. No distress, lungs clear.  Patient's pain is not typical for ACS. He had a negative stress test last year. Chest x-ray reviewed from urgent care. EKG without acute changes. Troponin negative. D-dimer negative.  Given pleuritic nature of pain, CT was pursued. No evidence of pulmonary embolism, pneumonia or other abnormality.  Urinalysis is negative. No hematuria. Do not suspect kidney stone.   Presentation appears consistent with pleurisy. We'll treat with anti-inflammatories and pain medication. Reassured patient no evidence of heart attack or blood clot in the lung. Return precautions discussed.   Date: 06/04/2013  Rate: 72  Rhythm: normal sinus rhythm  QRS Axis: normal  Intervals: normal  ST/T Wave abnormalities: normal  Conduction Disutrbances:none  Narrative Interpretation:   Old EKG Reviewed: unchanged    Glynn Octave, MD 06/04/13 1558

## 2013-06-05 LAB — D-DIMER, QUANTITATIVE: D-Dimer, Quant: 0.28 ug/mL-FEU (ref 0.00–0.48)

## 2013-06-14 ENCOUNTER — Ambulatory Visit: Payer: BC Managed Care – PPO | Admitting: Emergency Medicine

## 2013-06-14 VITALS — BP 105/69 | HR 68 | Temp 97.9°F | Resp 18 | Wt 199.0 lb

## 2013-06-14 DIAGNOSIS — J90 Pleural effusion, not elsewhere classified: Secondary | ICD-10-CM

## 2013-06-14 DIAGNOSIS — R079 Chest pain, unspecified: Secondary | ICD-10-CM

## 2013-06-14 MED ORDER — ZOSTER VACCINE LIVE 19400 UNT/0.65ML ~~LOC~~ SOLR: 0.6500 mL | Freq: Once | SUBCUTANEOUS | Status: DC

## 2013-06-14 NOTE — Progress Notes (Signed)
This chart was scribed for Nicholas Chris, MD by Joaquin Music, ED Scribe. This patient was seen in room Room/bed 8 and the patient's care was started at 2:28 PM. Subjective:    Patient ID: Nicholas West, male    DOB: [birth year], 57 y.o.   MRN: [id number] Chief Complaint  Patient presents with  . Follow-up    chest pain   HPI Nicholas West is a 57 y.o. male who presents to the [facility] complaining of F/U apt. Pt was last seen at [facility] on 06/04/2013. He was last seen for having chest/rib pain. Pt was diagnosed with pleurisy. Pt was told to F/U with Dr. Cleta Alberts by Dr. Manus Gunning. Pt states his symtoms have improved by taking the Ibuprofen he was prescribed and received relief. Pt states he completed his Ibuprofen 3 days ago. Pt states he is otherwise healthy.  Pt requested to receive a shingles virus vaccine. He states his wife recently got the vaccine and would like to have his.  Patient Active Problem List   Diagnosis Date Noted  . Paroxysmal atrial fibrillation 10/24/2012  . Hypercholesterolemia 04/03/2012  . ED (erectile dysfunction) 03/05/2012  . Hypogonadism male 03/05/2012   Current outpatient prescriptions:ALFALFA PO, Take by mouth See admin instructions. Shaklee Alfalfa tabs 2.85 g alfalfa powder, 300 mg calcium, Disp: , Rfl: ;  Ascorbic Acid (VITAMIN C PO), Take 500 mg by mouth daily., Disp: , Rfl: ;  aspirin 81 MG tablet, Take 81 mg by mouth daily., Disp: , Rfl: ;  B Complex Vitamins (B COMPLEX PO), Take 1 tablet by mouth daily. With folic acid, Disp: , Rfl:  calcium carbonate (OS-CAL - DOSED IN MG OF ELEMENTAL CALCIUM) 1250 MG tablet, Take 1 tablet by mouth daily with breakfast., Disp: , Rfl: ;  cholecalciferol (VITAMIN D) 1000 UNITS tablet, Take 1,000 Units by mouth 2 (two) times daily., Disp: , Rfl: ;  diltiazem (CARDIZEM CD) 180 MG 24 hr capsule, Take 1 capsule (180 mg total) by mouth daily., Disp: 90 capsule, Rfl: 3;  Garlic 1000 MG CAPS, Take 1,000 mg by mouth daily., Disp: , Rfl:   Multiple Vitamin (MULTIVITAMIN) tablet, Take 1 tablet by mouth daily., Disp: , Rfl: ;  Omega-3 Fatty Acids (OMEGA 3 PO), Take 1 capsule by mouth daily. , Disp: , Rfl: ;  omeprazole (PRILOSEC) 20 MG capsule, Take 1 capsule (20 mg total) by mouth daily., Disp: 30 capsule, Rfl: 11;  tacrolimus (PROTOPIC) 0.1 % ointment, Apply 1 application topically daily. , Disp: , Rfl:  tadalafil (CIALIS) 20 MG tablet, Take 0.5-1 tablets (10-20 mg total) by mouth every other day as needed for erectile dysfunction., Disp: 5 tablet, Rfl: 11;  HYDROcodone-acetaminophen (NORCO/VICODIN) 5-325 MG per tablet, Take 2 tablets by mouth every 4 (four) hours as needed., Disp: 10 tablet, Rfl: 0;  ibuprofen (ADVIL,MOTRIN) 800 MG tablet, Take 1 tablet (800 mg total) by mouth 3 (three) times daily., Disp: 21 tablet, Rfl: 0  Review of Systems  All other systems reviewed and are negative.   Objective:   Physical Exam CONSTITUTIONAL: Well developed/well nourished HEAD: Normocephalic/atraumatic EYES: EOMI/PERRL ENMT: Mucous membranes moist NECK: supple no meningeal signs SPINE:entire spine nontender CV: S1/S2 noted, no murmurs/rubs/gallops noted LUNGS: There is a rub present in the left base breath sounds are symmetric Lungs are clear to auscultation bilaterally, no apparent distress ABDOMEN: soft, nontender, no rebound or guarding GU:no cva tenderness NEURO: Pt is awake/alert, moves all extremitiesx4 EXTREMITIES: pulses normal, full ROM SKIN: warm, color normal PSYCH: no abnormalities  of mood noted  Triage Vitals:BP 105/69  Pulse 68  Temp(Src) 97.9 F (36.6 C) (Oral)  Resp 18  Wt 199 lb (90.266 kg)  SpO2 99% Assessment & Plan:  Patient apparently had a viral-type pleurisy. His CT done at the hospital did not reveal any evidence of a clot. He responded quickly to nonsteroidal drugs. No further followup necessary as long as pain does not recur

## 2013-08-28 ENCOUNTER — Ambulatory Visit: Payer: BC Managed Care – PPO | Admitting: Family Medicine

## 2013-08-28 VITALS — BP 108/70 | HR 65 | Temp 98.5°F | Resp 18 | Ht 71.5 in | Wt 193.0 lb

## 2013-08-28 DIAGNOSIS — R197 Diarrhea, unspecified: Secondary | ICD-10-CM

## 2013-08-28 LAB — POCT CBC
Granulocyte percent: 72.9 %G (ref 37–80)
HCT, POC: 53.9 % — AB (ref 43.5–53.7)
Hemoglobin: 17.5 g/dL (ref 14.1–18.1)
Lymph, poc: 2.3 (ref 0.6–3.4)
MCH, POC: 29.7 pg (ref 27–31.2)
MCHC: 32.5 g/dL (ref 31.8–35.4)
MCV: 91.5 fL (ref 80–97)
MID (cbc): 0.8 (ref 0–0.9)
MPV: 8.1 fL (ref 0–99.8)
POC Granulocyte: 8.1 — AB (ref 2–6.9)
POC LYMPH PERCENT: 20.3 %L (ref 10–50)
POC MID %: 6.8 %M (ref 0–12)
Platelet Count, POC: 167 10*3/uL (ref 142–424)
RBC: 5.89 M/uL (ref 4.69–6.13)
RDW, POC: 13.2 %
WBC: 11.1 10*3/uL — AB (ref 4.6–10.2)

## 2013-08-28 LAB — COMPREHENSIVE METABOLIC PANEL
ALT: 29 U/L (ref 0–53)
AST: 30 U/L (ref 0–37)
Albumin: 4.8 g/dL (ref 3.5–5.2)
Alkaline Phosphatase: 70 U/L (ref 39–117)
BUN: 11 mg/dL (ref 6–23)
CO2: 27 mEq/L (ref 19–32)
Calcium: 9.8 mg/dL (ref 8.4–10.5)
Chloride: 104 mEq/L (ref 96–112)
Creat: 0.89 mg/dL (ref 0.50–1.35)
Glucose, Bld: 84 mg/dL (ref 70–99)
Potassium: 4 mEq/L (ref 3.5–5.3)
Sodium: 140 mEq/L (ref 135–145)
Total Bilirubin: 1 mg/dL (ref 0.2–1.2)
Total Protein: 7.5 g/dL (ref 6.0–8.3)

## 2013-08-28 NOTE — Progress Notes (Signed)
Subjective: 58 year old gentleman who has a history of having 3 episodes of diarrheal problems over the past few months. He had been feeling a little tenderness abdomen a little discomfort on the right for a day or so, then last night and diarrhea on and the hourly until about 3 AM. Felt uncomfortable but not major pain and cramping.  He has a history of heartburn, but did not have any vomiting. No blood in the stool. He has the sensation of urge to move his bowels but nothing comes or just a small amount.  Apparently he had several episodes in December and a month or so earlier than that. he hadn't paid much attention to it until that this is the third similar event. No family history of diarrheal diseases. No one else at home is sick. No history of inflammatory bowel diseases.  Objective: Throat clear. Neck supple. Chest clear to auscultation. Heart regular. Abdomen has active bowel sounds. Soft, nontender.  Assessment: Recurrent diarrhea  Plan: Check labs Reviewed his medications. Nothing is really new.  We'll check a stool culture and TSH along with chemistries and CBC  Results for orders placed in visit on 08/28/13  POCT CBC      Result Value Range   WBC 11.1 (*) 4.6 - 10.2 K/uL   Lymph, poc 2.3  0.6 - 3.4   POC LYMPH PERCENT 20.3  10 - 50 %L   MID (cbc) 0.8  0 - 0.9   POC MID % 6.8  0 - 12 %M   POC Granulocyte 8.1 (*) 2 - 6.9   Granulocyte percent 72.9  37 - 80 %G   RBC 5.89  4.69 - 6.13 M/uL   Hemoglobin 17.5  14.1 - 18.1 g/dL   HCT, POC 53.9 (*) 43.5 - 53.7 %   MCV 91.5  80 - 97 fL   MCH, POC 29.7  27 - 31.2 pg   MCHC 32.5  31.8 - 35.4 g/dL   RDW, POC 13.2     Platelet Count, POC 167  142 - 424 K/uL   MPV 8.1  0 - 99.8 fL

## 2013-08-28 NOTE — Patient Instructions (Signed)
Drink plenty of fluids and get enough rest  Bland diet for the next couple of days  See the gastroenterologist as scheduled, Dr. Michail Sermon  Call if worse. Get rechecked if high fever, passing blood, increased abdominal pain, or other items of concern.

## 2013-08-29 LAB — TSH: TSH: 2.092 u[IU]/mL (ref 0.350–4.500)

## 2013-09-01 ENCOUNTER — Telehealth: Payer: Self-pay

## 2013-09-01 ENCOUNTER — Encounter: Payer: Self-pay | Admitting: Radiology

## 2013-09-01 LAB — STOOL CULTURE

## 2013-09-01 NOTE — Telephone Encounter (Signed)
Patient states that Dr. Linna Darner wants him to go to [facility] and for a copy of his lab work to be sent to them in preparation for his appointment tomorrow. Patient called today upset when I informed him that a ROI either needed to be completed by him or by [facility] in order for [place] to send that information. Can we contact [facility] and have them send [place] a ROI?  [phone]

## 2013-09-03 NOTE — Telephone Encounter (Signed)
Since we referred him theres no need for an ROI. This is supposed to go to Referrals to work on. Ill send them through epic now.

## 2013-09-29 ENCOUNTER — Ambulatory Visit (INDEPENDENT_AMBULATORY_CARE_PROVIDER_SITE_OTHER): Payer: BC Managed Care – PPO | Admitting: Cardiology

## 2013-09-29 ENCOUNTER — Encounter: Payer: Self-pay | Admitting: Cardiology

## 2013-09-29 VITALS — BP 136/80 | HR 70 | Ht 72.0 in | Wt 201.0 lb

## 2013-09-29 DIAGNOSIS — I480 Paroxysmal atrial fibrillation: Secondary | ICD-10-CM

## 2013-09-29 DIAGNOSIS — I4891 Unspecified atrial fibrillation: Secondary | ICD-10-CM

## 2013-09-29 NOTE — Patient Instructions (Signed)
Continue your current therapy  I will see you in one year   

## 2013-09-29 NOTE — Progress Notes (Signed)
Edwina Barth Averhart Date of Birth: [birth year] Medical Record #[id number]  History of Present Illness: Nicholas West is seen back today for a followup visit.  He has a history of paroxysmal atrial fibrillation. He has been managed with rate control with diltiazem. He has a [place] vascular score of 0. He is taking a baby aspirin daily.  He's had a few episodes of palpitations particularly in the early morning hours. They last only a couple of minutes. His energy level has improved since we switched him from metoprolol to diltiazem.  Current Outpatient Prescriptions on File Prior to Visit  Medication Sig Dispense Refill  . ALFALFA PO Take by mouth See admin instructions. Shaklee Alfalfa tabs 2.85 g alfalfa powder, 300 mg calcium      . Ascorbic Acid (VITAMIN C PO) Take 500 mg by mouth daily.      Marland Kitchen aspirin 81 MG tablet Take 81 mg by mouth daily.      . B Complex Vitamins (B COMPLEX PO) Take 1 tablet by mouth daily. With folic acid      . calcium carbonate (OS-CAL - DOSED IN MG OF ELEMENTAL CALCIUM) 1250 MG tablet Take 1 tablet by mouth daily with breakfast.      . cholecalciferol (VITAMIN D) 1000 UNITS tablet Take 1,000 Units by mouth 2 (two) times daily.      Marland Kitchen diltiazem (CARDIZEM CD) 180 MG 24 hr capsule Take 1 capsule (180 mg total) by mouth daily.  90 capsule  3  . Garlic 0626 MG CAPS Take 1,000 mg by mouth daily.      . Multiple Vitamin (MULTIVITAMIN) tablet Take 1 tablet by mouth daily.      . Omega-3 Fatty Acids (OMEGA 3 PO) Take 1 capsule by mouth daily.       Marland Kitchen omeprazole (PRILOSEC) 20 MG capsule Take 1 capsule (20 mg total) by mouth daily.  30 capsule  11  . tacrolimus (PROTOPIC) 0.1 % ointment Apply 1 application topically daily.       . tadalafil (CIALIS) 20 MG tablet Take 0.5-1 tablets (10-20 mg total) by mouth every other day as needed for erectile dysfunction.  5 tablet  11  . zoster vaccine live, PF, (ZOSTAVAX) 94854 UNT/0.65ML injection Inject 19,400 Units into the skin once.  1 each   0   No current facility-administered medications on file prior to visit.    Allergies  Allergen Reactions  . Penicillins     rash    Past Medical History  Diagnosis Date  . Abdominal pain   . Diverticular disease   . Diverticulitis   . GERD (gastroesophageal reflux disease)   . Chest pain     negative stress echo in September 2013  . Atrial fibrillation     Past Surgical History  Procedure Laterality Date  . Partial colectomy  11/11  . Colon surgery      History  Smoking status  . Never Smoker   Smokeless tobacco  . Not on file    History  Alcohol Use No    Family History  Problem Relation Age of Onset  . Cancer Mother     Review of Systems: The review of systems is per the HPI.  All other systems were reviewed and are negative.  Physical Exam: BP 136/80  Pulse 70  Ht 6' (1.829 m)  Wt 201 lb (91.173 kg)  BMI 27.25 kg/m2 Patient is very pleasant and in no acute distress. Skin is warm and dry. Color  is normal.  HEENT is unremarkable. Normocephalic/atraumatic. PERRL. Sclera are nonicteric. Neck is supple. No masses. No JVD. Lungs are clear. Cardiac exam shows a regular rate and rhythm. Abdomen is soft. Extremities are without edema. Gait and ROM are intact. No gross neurologic deficits noted.  LABORATORY DATA: Pending  Lab Results  Component Value Date   WBC 11.1* 08/28/2013   HGB 17.5 08/28/2013   HCT 53.9* 08/28/2013   PLT 136* 06/04/2013   GLUCOSE 84 08/28/2013   CHOL 153 04/22/2013   TRIG 95 04/22/2013   HDL 40 04/22/2013   LDLCALC 94 04/22/2013   ALT 29 08/28/2013   AST 30 08/28/2013   NA 140 08/28/2013   K 4.0 08/28/2013   CL 104 08/28/2013   CREATININE 0.89 08/28/2013   BUN 11 08/28/2013   CO2 27 08/28/2013   TSH 2.092 08/28/2013   PSA 1.02 04/22/2013   INR 1.10 06/04/2013      Assessment / Plan: 1. Paroxysmal atrial fibrillation. Patient is minimally symptomatic. He has a [place] and [place] vascular score of 0.  I recommended he take a baby aspirin  daily.  Since his symptoms are fairly minor I would recommend continued rate control strategy over rhythm control.  2. Mild HLD   3. Prior normal stress echo and Echo

## 2013-10-21 ENCOUNTER — Ambulatory Visit: Payer: BC Managed Care – PPO | Admitting: Emergency Medicine

## 2013-11-04 ENCOUNTER — Other Ambulatory Visit: Payer: Self-pay | Admitting: Emergency Medicine

## 2013-11-05 NOTE — Telephone Encounter (Signed)
Faxed Rx

## 2013-11-18 ENCOUNTER — Ambulatory Visit: Payer: BC Managed Care – PPO

## 2013-11-18 ENCOUNTER — Other Ambulatory Visit: Payer: Self-pay | Admitting: Emergency Medicine

## 2013-11-18 ENCOUNTER — Encounter: Payer: Self-pay | Admitting: Emergency Medicine

## 2013-11-18 ENCOUNTER — Ambulatory Visit (INDEPENDENT_AMBULATORY_CARE_PROVIDER_SITE_OTHER): Payer: BC Managed Care – PPO | Admitting: Emergency Medicine

## 2013-11-18 VITALS — BP 117/76 | HR 70 | Temp 98.2°F | Resp 16 | Ht 72.0 in | Wt 198.0 lb

## 2013-11-18 DIAGNOSIS — M549 Dorsalgia, unspecified: Secondary | ICD-10-CM

## 2013-11-18 DIAGNOSIS — M25551 Pain in right hip: Secondary | ICD-10-CM

## 2013-11-18 DIAGNOSIS — M25559 Pain in unspecified hip: Secondary | ICD-10-CM

## 2013-11-18 DIAGNOSIS — E291 Testicular hypofunction: Secondary | ICD-10-CM

## 2013-11-18 IMAGING — CR DG LUMBAR SPINE 2-3V
3 series · 3 of 3 positions shown · non-contrast
Comparison: CT abdomen and pelvis [DATE].

CLINICAL DATA: Back pain.

EXAM:
LUMBAR SPINE - 2-3 VIEW

[other]
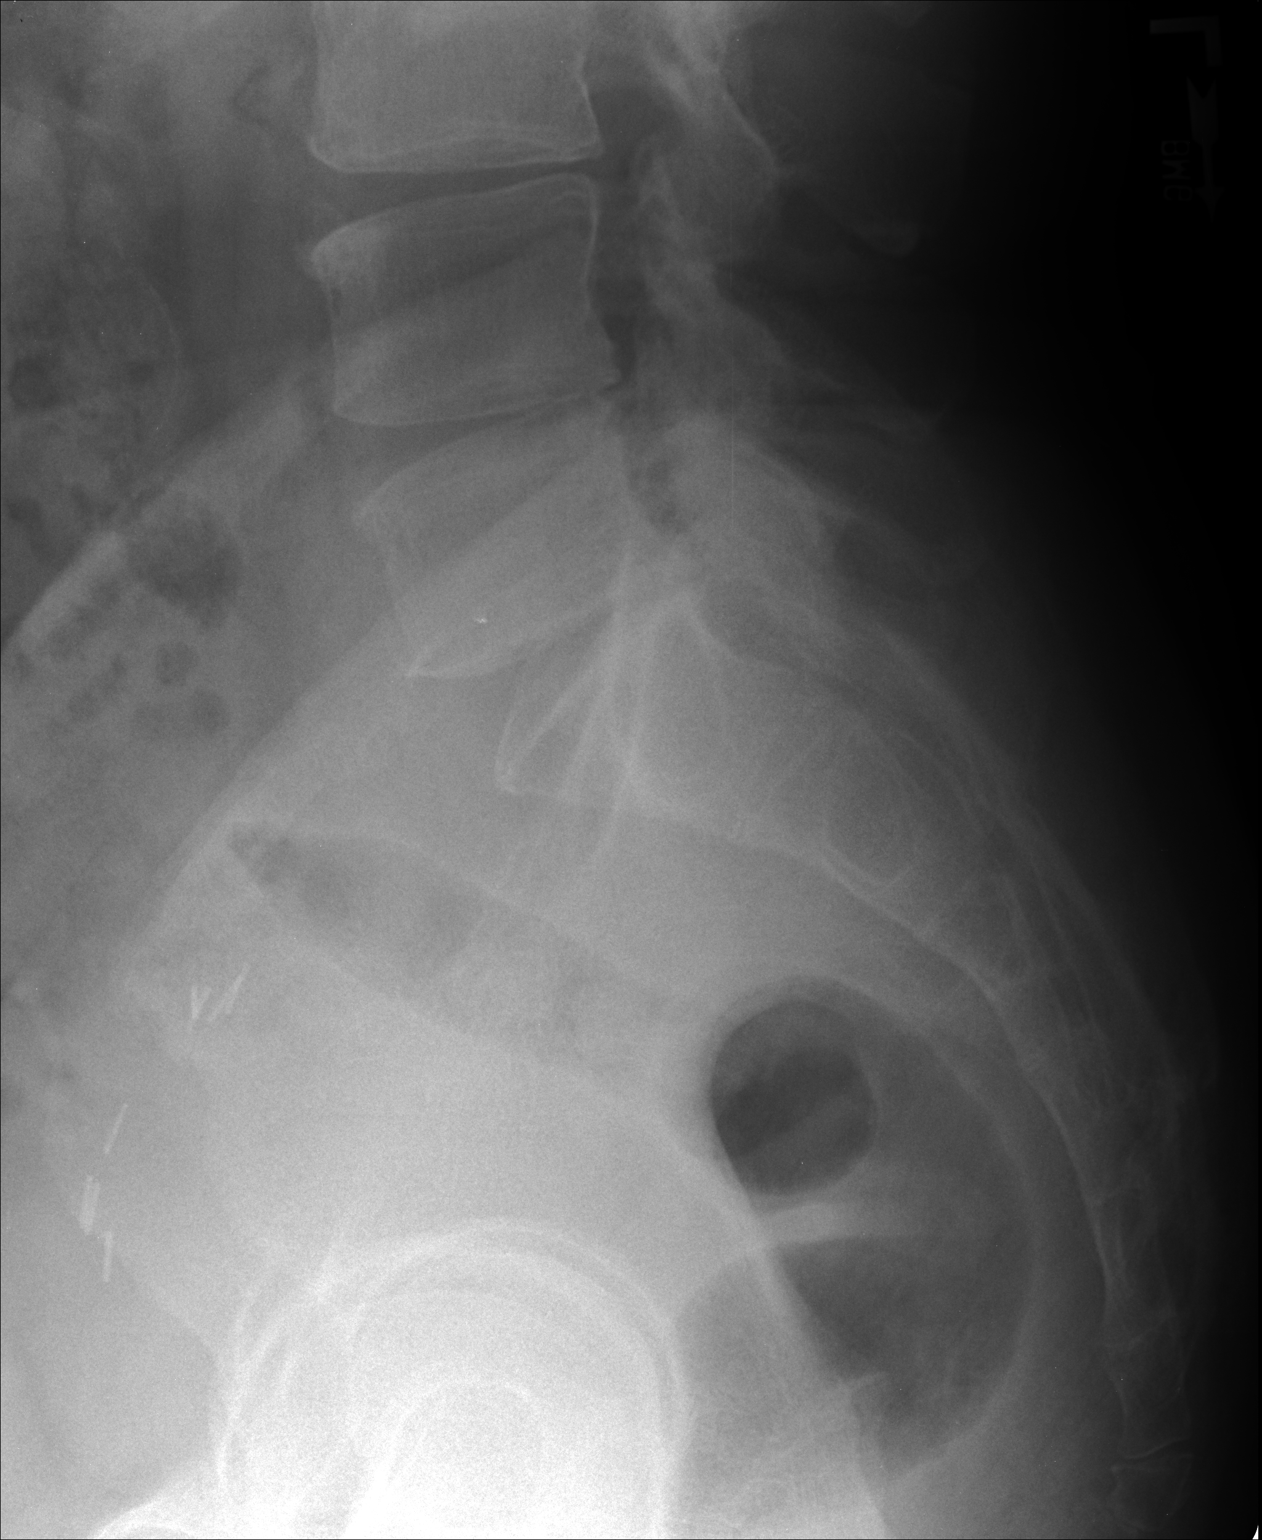

[AP]
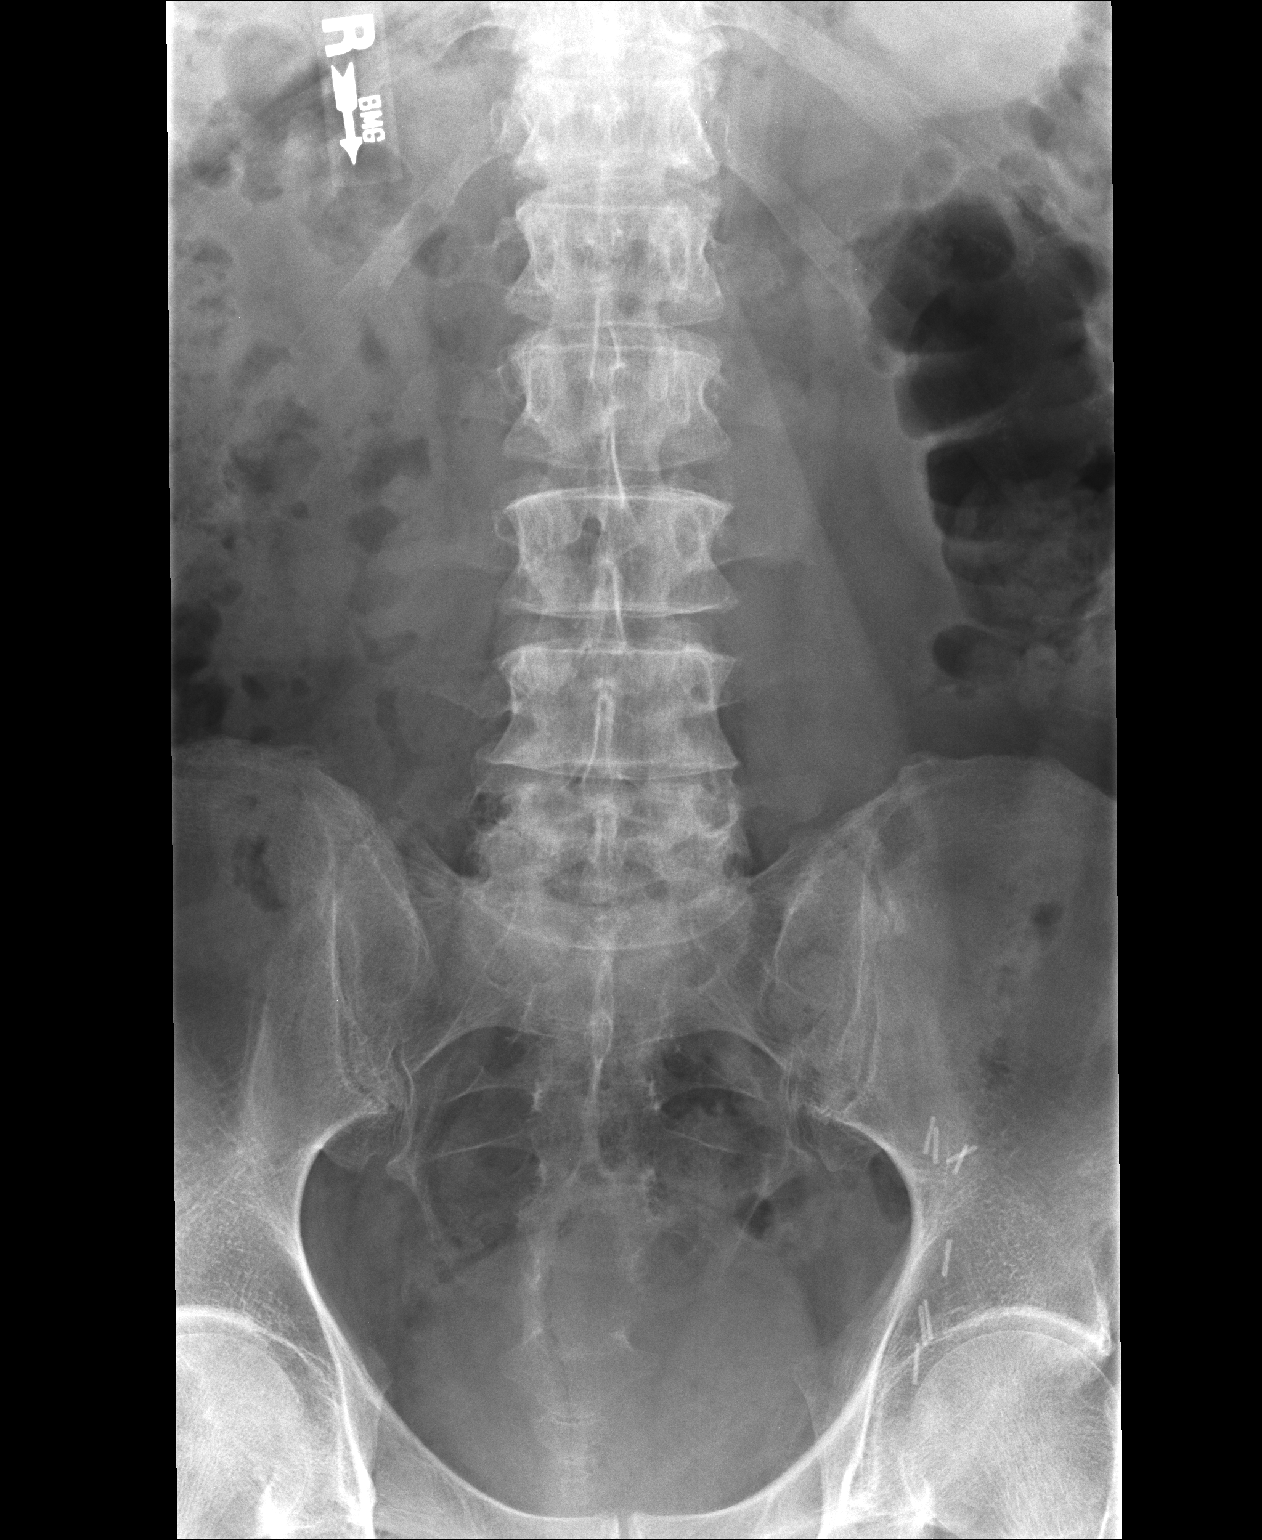

[left lateral]
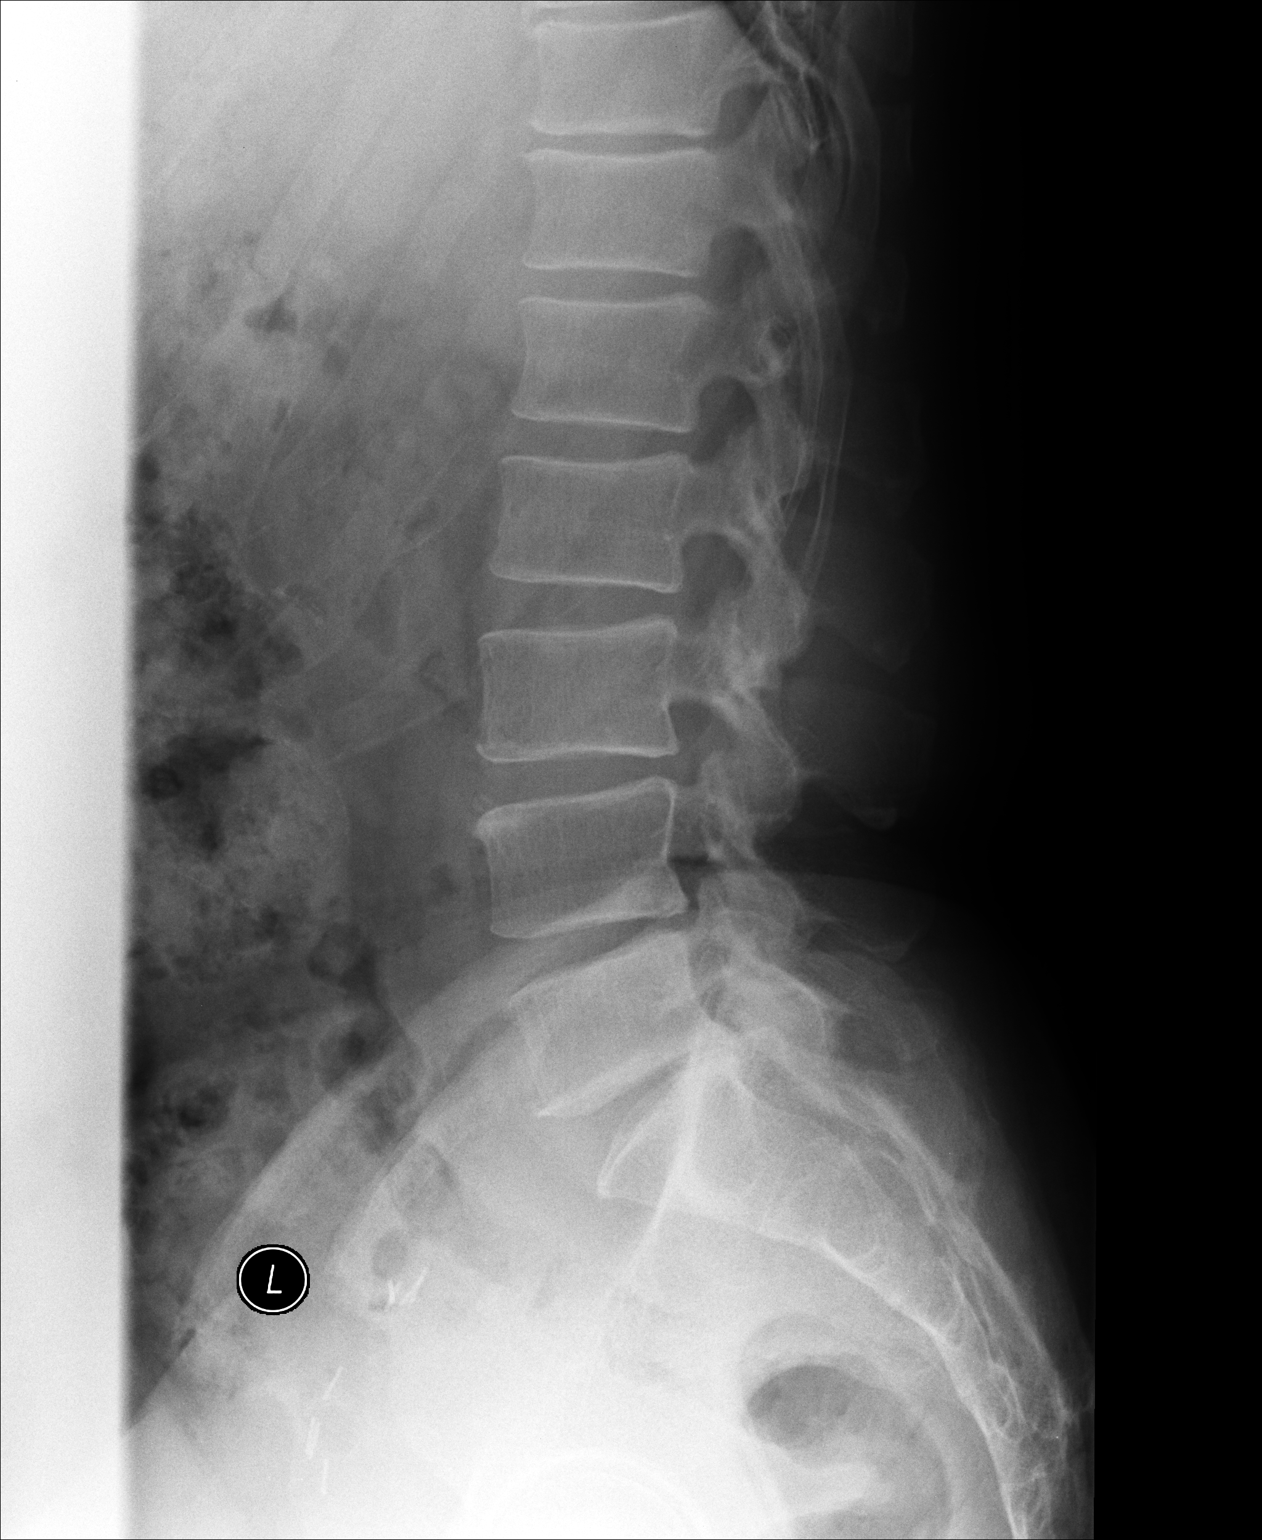

[3 of 3 positions shown; findings below may reference images not displayed]

FINDINGS: Vertebral body height and alignment are maintained. There is some
loss of disc space height at L4-5 and L5-S1. Facet degenerative
change is also noted at these levels. Surgical clips left pelvis are
identified.
IMPRESSION: No acute finding. Mild appearing degenerative disease L4-5 and
L5-S1.

## 2013-11-18 IMAGING — CR DG HIP COMPLETE 2+V*R*
3 series · 3 of 3 positions shown · non-contrast
Comparison: CT abdomen and pelvis [DATE].

CLINICAL DATA: Right hip pain.

EXAM:
RIGHT HIP - COMPLETE 2+ VIEW

[AP (1 of 2)]
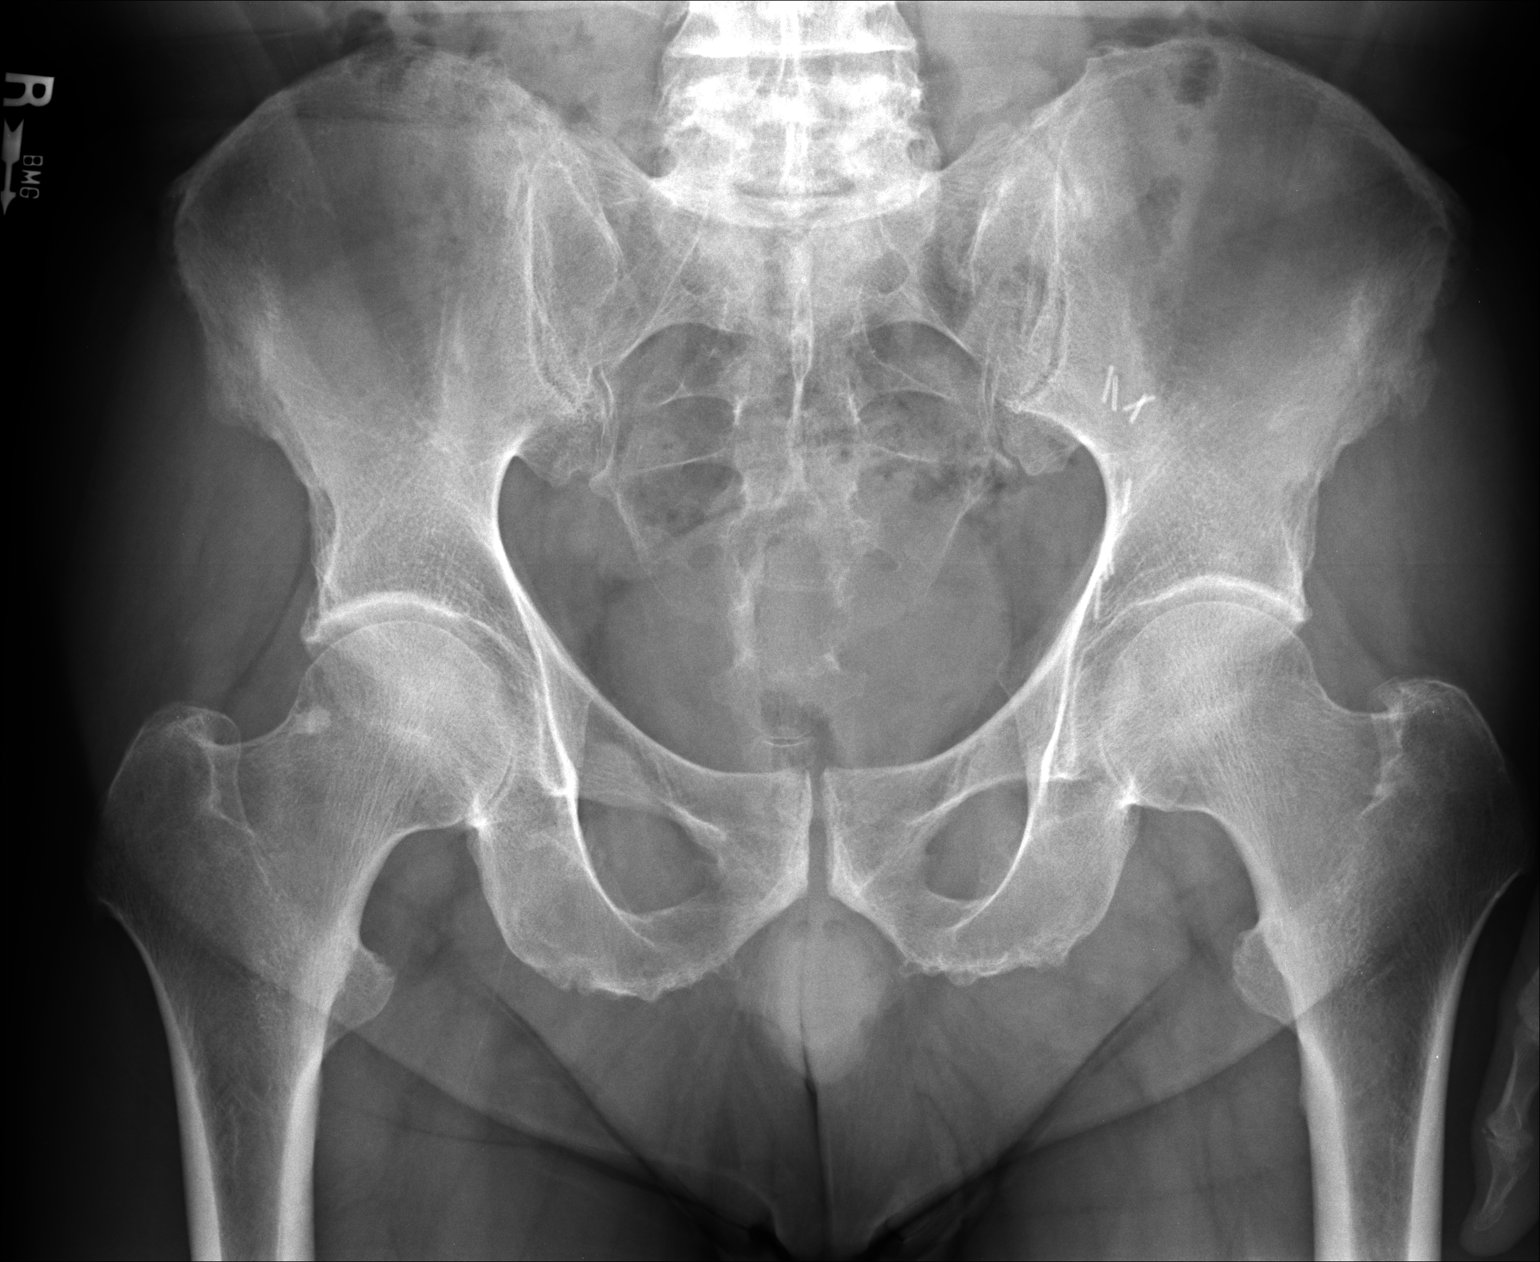

[lateral]
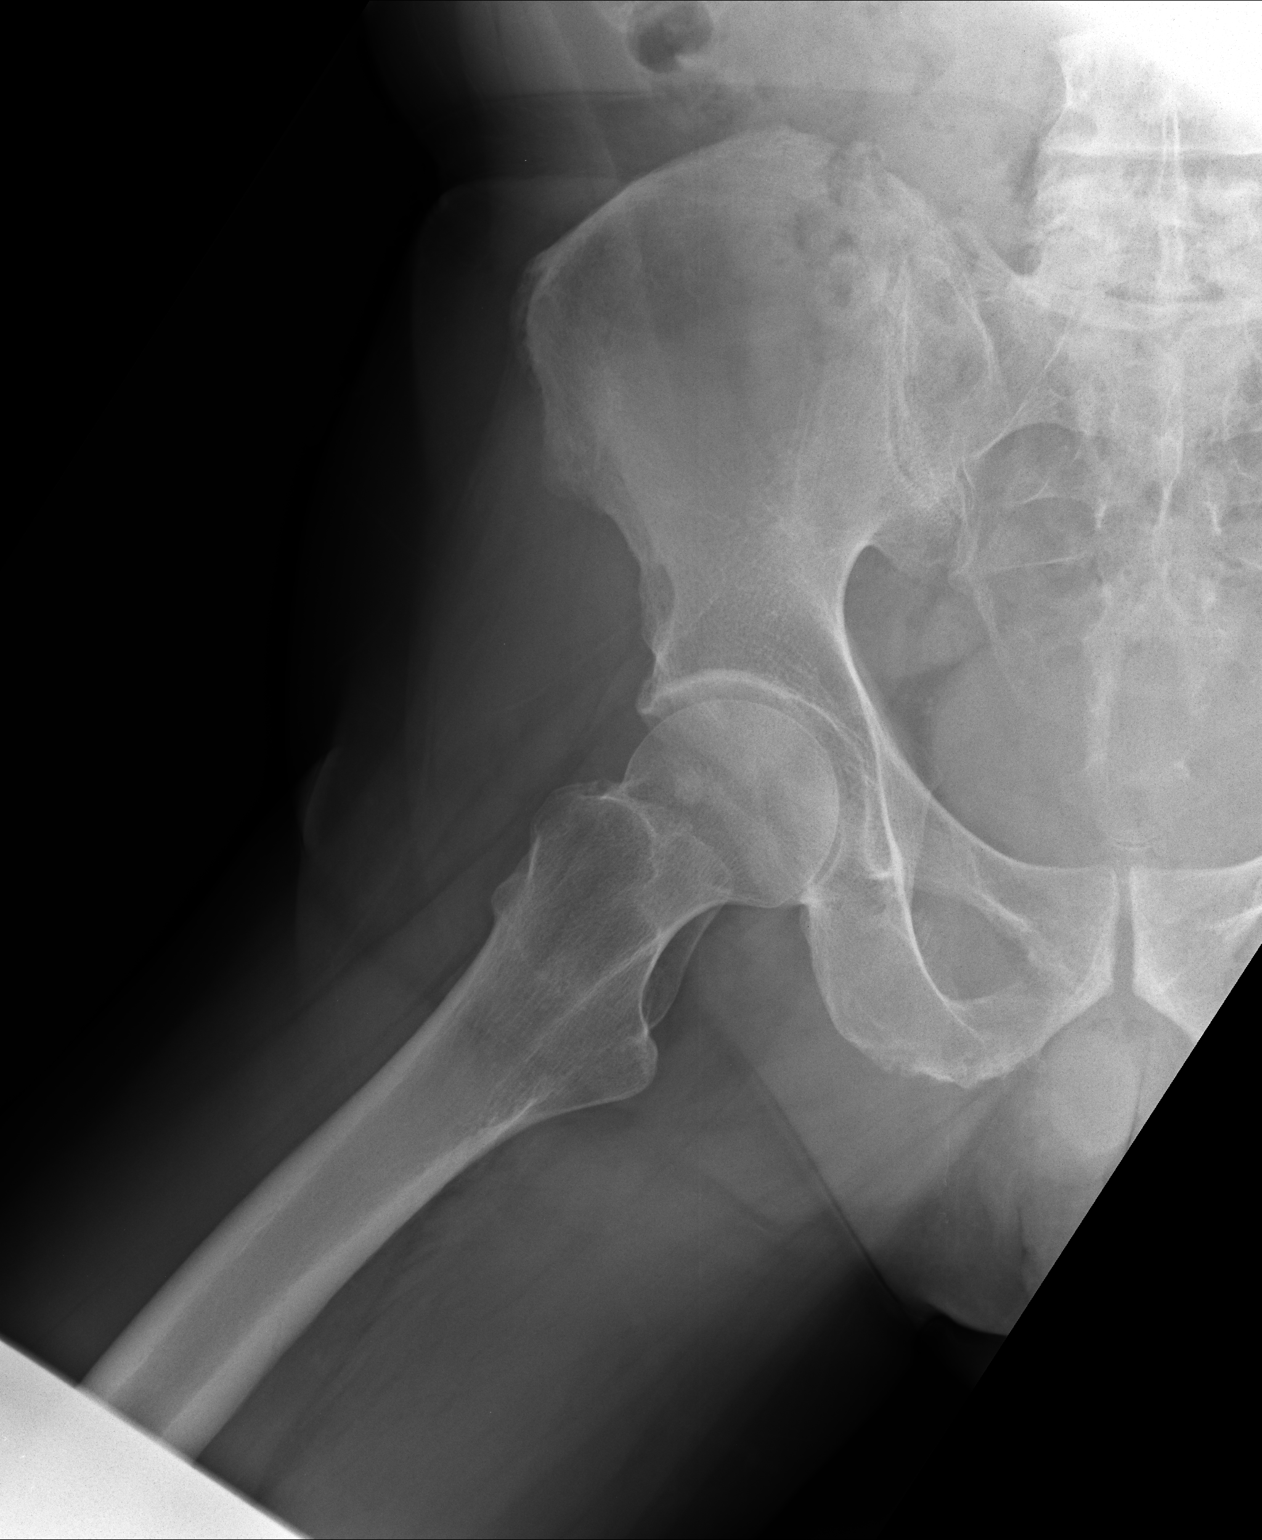

[AP (2 of 2)]
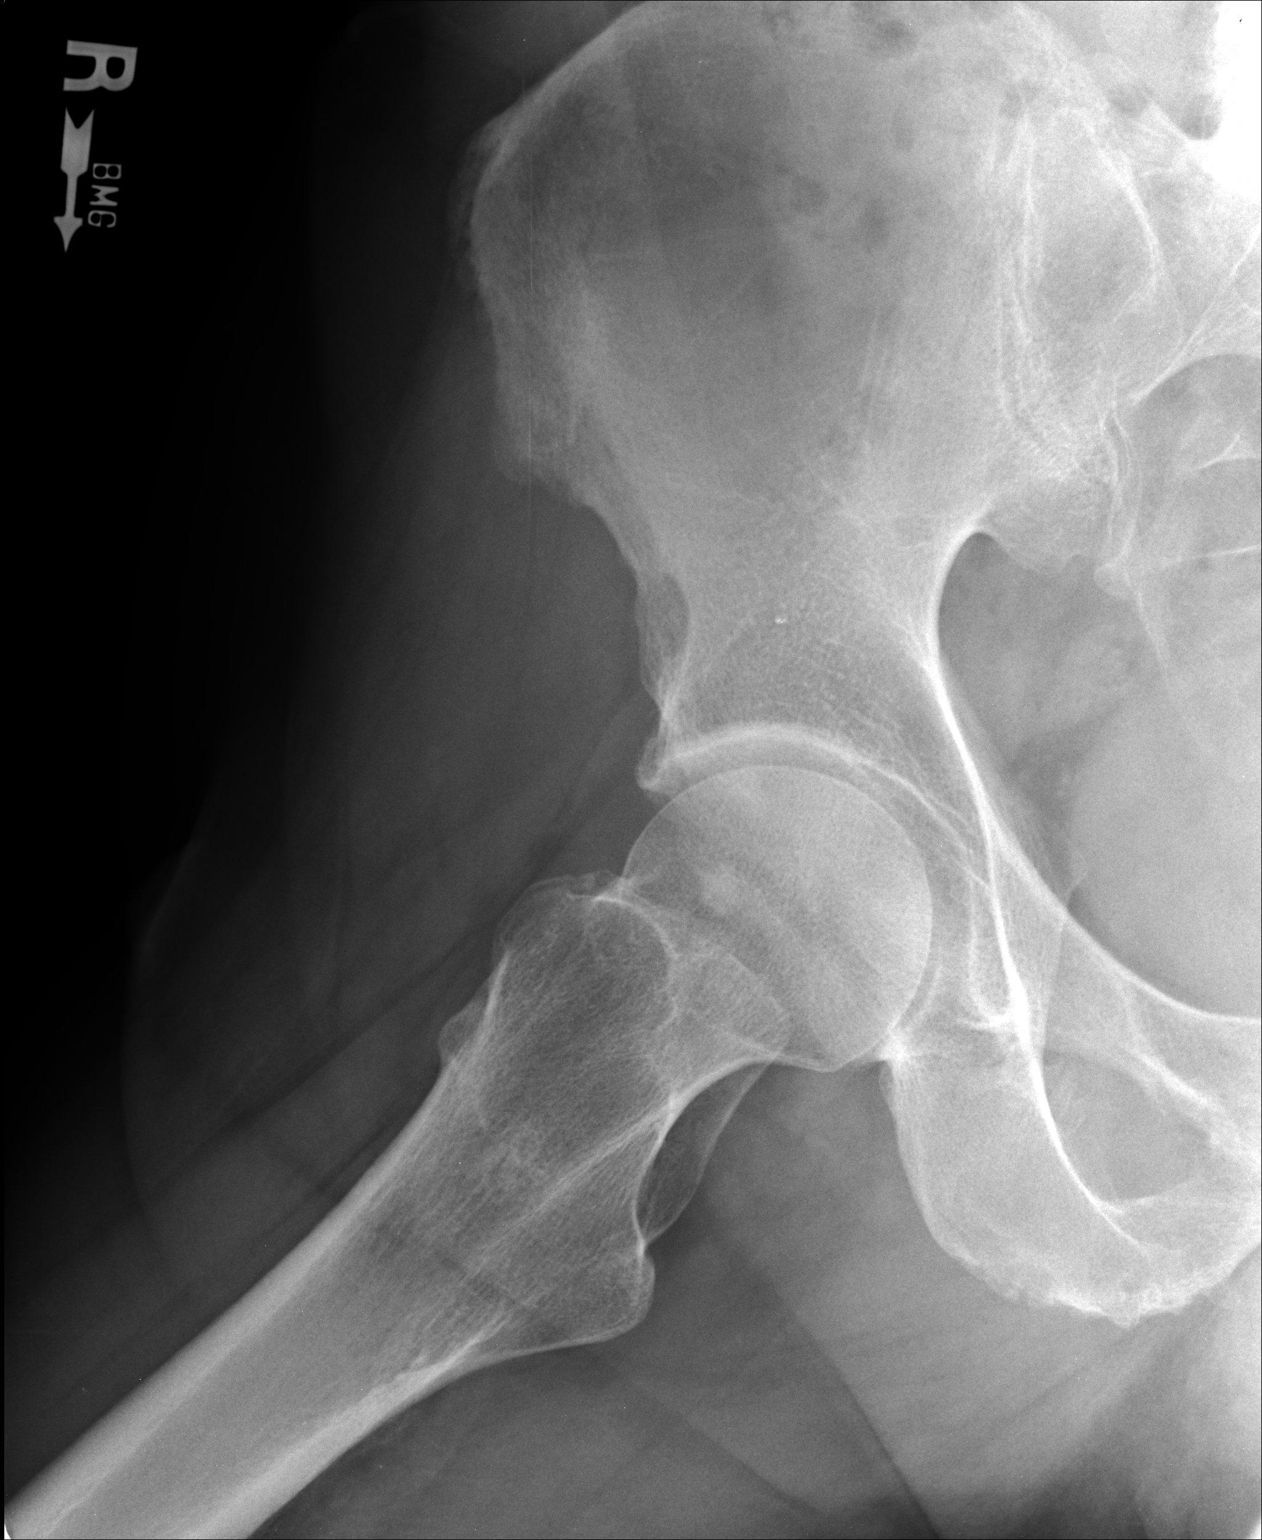

[3 of 3 positions shown; findings below may reference images not displayed]

FINDINGS: Both hips are located. There is no fracture. No notable degenerative
change is seen about the hips, symphysis pubis or sacroiliac joints.
Small sclerotic lesion in the right femoral neck is stable in
appearance and most consistent with a bone island.
IMPRESSION: Negative exam.

## 2013-11-18 MED ORDER — TRAMADOL HCL 50 MG PO TABS: 50.0000 mg | ORAL_TABLET | Freq: Three times a day (TID) | ORAL | Status: DC | PRN

## 2013-11-18 MED ORDER — GABAPENTIN 300 MG PO CAPS: ORAL_CAPSULE | ORAL | Status: DC

## 2013-11-18 NOTE — Progress Notes (Signed)
Subjective:  This chart was scribed for Nicholas Queen, MD by Roxan Diesel, Scribe.  This patient was seen in [facility] [facility] and the patient's care was started at 4:02 PM.   Patient ID: Nicholas West, male    DOB: 1956-07-23, 58 y.o.   MRN: [id number]  Chief Complaint  Patient presents with  . Follow-up    6 month  . Hip Pain    right hip x 1 month    HPI  HPI Comments: Nicholas West is a 58 y.o. male who presents to [facility] for a 50-month follow-up.  Pt has h/o hypogonadism.  He is on testosterone replacement and needs his testosterone and PSA levels checked.  He also complains of one month of right hip pain radiating down his right leg.  He states this began while he was in [place] and was diagnosed as "sciatica" in an Urgent Care there.  He was placed on gabapentin and ibuprofen.  He states his pain has been gradually improving since then but is not completely resolved.  He has continued to have throbbing right hip pain radiating down the posterior right leg.  He states he is limping and the front of his right lower leg feels "tight."   Patient Active Problem List   Diagnosis Date Noted  . Paroxysmal atrial fibrillation 10/24/2012  . Hypercholesterolemia 04/03/2012  . ED (erectile dysfunction) 03/05/2012  . Hypogonadism male 03/05/2012    Past Medical History  Diagnosis Date  . Abdominal pain   . Diverticular disease   . Diverticulitis   . GERD (gastroesophageal reflux disease)   . Chest pain     negative stress echo in September 2013  . Atrial fibrillation     Past Surgical History  Procedure Laterality Date  . Partial colectomy  11/11  . Colon surgery      Prior to Admission medications   Medication Sig Start Date End Date Taking? Authorizing Provider  ALFALFA PO Take by mouth See admin instructions. Shaklee Alfalfa tabs 2.85 g alfalfa powder, 300 mg calcium    Historical Provider, MD  Ascorbic Acid (VITAMIN C PO) Take 500 mg by mouth daily.    Historical  Provider, MD  aspirin 81 MG tablet Take 81 mg by mouth daily.    Historical Provider, MD  AXIRON 30 MG/ACT SOLN PLACE 2 PUMPS ONTO THE SKIN DAILY. NEEDS OFFICE VISIT FOR ADDITION REFILLS    [name], MD  B Complex Vitamins (B COMPLEX PO) Take 1 tablet by mouth daily. With folic acid    Historical Provider, MD  calcium carbonate (OS-CAL - DOSED IN MG OF ELEMENTAL CALCIUM) 1250 MG tablet Take 1 tablet by mouth daily with breakfast.    Historical Provider, MD  cholecalciferol (VITAMIN D) 1000 UNITS tablet Take 1,000 Units by mouth 2 (two) times daily.    Historical Provider, MD  diltiazem (CARDIZEM CD) 180 MG 24 hr capsule Take 1 capsule (180 mg total) by mouth daily. 04/24/13   [name], MD  Garlic 7829 MG CAPS Take 1,000 mg by mouth daily.    Historical Provider, MD  Multiple Vitamin (MULTIVITAMIN) tablet Take 1 tablet by mouth daily.    Historical Provider, MD  Omega-3 Fatty Acids (OMEGA 3 PO) Take 1 capsule by mouth daily.     Historical Provider, MD  omeprazole (PRILOSEC) 20 MG capsule Take 1 capsule (20 mg total) by mouth daily. 03/05/12   [name], MD  tacrolimus (PROTOPIC) 0.1 % ointment Apply  1 application topically daily.     Historical Provider, MD  tadalafil (CIALIS) 20 MG tablet Take 0.5-1 tablets (10-20 mg total) by mouth every other day as needed for erectile dysfunction. 04/22/13   [name], MD  zoster vaccine live, PF, (ZOSTAVAX) 16109 UNT/0.65ML injection Inject 19,400 Units into the skin once. 06/14/13   [name], MD    History   Social History  . Marital Status: Married    Spouse Name: N/A    Number of Children: 0  . Years of Education: N/A   Occupational History  . attorney    Social History Main Topics  . Smoking status: Never Smoker   . Smokeless tobacco: Not on file  . Alcohol Use: No  . Drug Use: No  . Sexual Activity: Yes   Other Topics Concern  . Not on file   Social History Narrative  . No narrative on file     Review of Systems   Musculoskeletal: Positive for arthralgias (right hip).         Objective:   Physical Exam CONSTITUTIONAL: Well developed/well nourished HEAD: Normocephalic/atraumatic EYES: EOMI/PERRL ENMT: Mucous membranes moist NECK: supple no meningeal signs SPINE:entire spine nontender CV: S1/S2 noted, no murmurs/rubs/gallops noted LUNGS: Lungs are clear to auscultation bilaterally, no apparent distress ABDOMEN: soft, nontender, no rebound or guarding GU:no cva tenderness NEURO: Pt is awake/alert, moves all extremitiesx4 EXTREMITIES: no back tenderness. positive straight leg raise on the right at 80 degrees. knee reflex 2+ and symmetrical, left ankle reflex 2+, right ankle reflex absent.  No motor weakness. pulses normal SKIN: warm, color normal PSYCH: no abnormalities of mood noted   BP 117/76  Pulse 70  Temp(Src) 98.2 F (36.8 C)  Resp 16  Ht 6' (1.829 m)  Wt 198 lb (89.812 kg)  BMI 26.85 kg/m2  SpO2 97%  [facility] reading (PRIMARY) by  Dr.Elvert Cumpton there appears to be a small bone island in the femoral head on the right. There is mild decrease in the disc space L4-5 and L5-S1        Assessment & Plan:  Patient has back pain, leg pain, and  loss of right ankle reflex consistent with an S1 radiculopathy. His repeat labs were done regarding his testosterone replacement therapy. I did continue him on Ultram for pain along with Neurontin one to 2 tablets at bedtime. If he continues to have problems he will contact us and we will proceed with an MRI of the lumbar spine I personally performed the services described in this documentation, which was scribed in my presence. The recorded information has been reviewed and is accurate.

## 2013-11-18 NOTE — Patient Instructions (Signed)

## 2013-11-19 LAB — PSA, TOTAL AND FREE
PSA, Free Pct: 22 % — ABNORMAL LOW (ref >25)
PSA, Free: 0.3 ng/mL
PSA: 1.36 ng/mL (ref <=4.00)

## 2013-11-19 LAB — PSA: PSA: 1.36 ng/mL (ref <=4.00)

## 2013-11-20 LAB — TESTOSTERONE, FREE, TOTAL, SHBG
Sex Hormone Binding: 23 nmol/L (ref 13–71)
Testosterone, Free: 34.1 pg/mL — ABNORMAL LOW (ref 47.0–244.0)
Testosterone-% Free: 2.3 % (ref 1.6–2.9)
Testosterone: 150 ng/dL — ABNORMAL LOW (ref 300–890)

## 2013-11-27 ENCOUNTER — Other Ambulatory Visit: Payer: Self-pay | Admitting: Emergency Medicine

## 2013-11-27 DIAGNOSIS — M5431 Sciatica, right side: Secondary | ICD-10-CM

## 2013-12-06 ENCOUNTER — Other Ambulatory Visit: Payer: Self-pay | Admitting: Emergency Medicine

## 2014-03-01 ENCOUNTER — Ambulatory Visit (INDEPENDENT_AMBULATORY_CARE_PROVIDER_SITE_OTHER): Payer: BC Managed Care – PPO | Admitting: Family Medicine

## 2014-03-01 VITALS — BP 120/78 | HR 61 | Temp 97.8°F | Resp 16 | Ht 72.0 in | Wt 188.4 lb

## 2014-03-01 DIAGNOSIS — IMO0002 Reserved for concepts with insufficient information to code with codable children: Secondary | ICD-10-CM

## 2014-03-01 NOTE — Progress Notes (Signed)
Subjective:    Patient ID: Nicholas West, male    DOB: 13-Nov-1955, 58 y.o.   MRN: [id number] This chart was scribed for Shawnee Knapp, MD by Cathie Hoops, ED Scribe. The patient was seen in Room 2. The patient's care was started at 10:37 AM.   Chief Complaint  Patient presents with  . Laceration on Nose    From Shaving   Past Medical History  Diagnosis Date  . Abdominal pain   . Diverticular disease   . Diverticulitis   . GERD (gastroesophageal reflux disease)   . Chest pain     negative stress echo in September 2013  . Atrial fibrillation    Current Outpatient Prescriptions on File Prior to Visit  Medication Sig Dispense Refill  . ALFALFA PO Take by mouth See admin instructions. Shaklee Alfalfa tabs 2.85 g alfalfa powder, 300 mg calcium      . Ascorbic Acid (VITAMIN C PO) Take 500 mg by mouth daily.      Marland Kitchen aspirin 81 MG tablet Take 81 mg by mouth daily.      Hinda Kehr 30 MG/ACT SOLN PLACE 2 PUMPS ONTO THE SKIN DAILY. NEEDS OFFICE VISIT FOR ADDITION REFILLS  90 mL  5  . B Complex Vitamins (B COMPLEX PO) Take 1 tablet by mouth daily. With folic acid      . calcium carbonate (OS-CAL - DOSED IN MG OF ELEMENTAL CALCIUM) 1250 MG tablet Take 1 tablet by mouth daily with breakfast.      . cholecalciferol (VITAMIN D) 1000 UNITS tablet Take 1,000 Units by mouth 2 (two) times daily.      Marland Kitchen diltiazem (CARDIZEM CD) 180 MG 24 hr capsule Take 1 capsule (180 mg total) by mouth daily.  90 capsule  3  . Garlic 6073 MG CAPS Take 1,000 mg by mouth daily.      . Multiple Vitamin (MULTIVITAMIN) tablet Take 1 tablet by mouth daily.      . Omega-3 Fatty Acids (OMEGA 3 PO) Take 1 capsule by mouth daily.       Marland Kitchen omeprazole (PRILOSEC) 20 MG capsule Take 1 capsule (20 mg total) by mouth daily.  30 capsule  11  . tacrolimus (PROTOPIC) 0.1 % ointment Apply 1 application topically daily.       . tadalafil (CIALIS) 20 MG tablet Take 0.5-1 tablets (10-20 mg total) by mouth every other day as needed for erectile  dysfunction.  5 tablet  11  . gabapentin (NEURONTIN) 300 MG capsule Take one tablet at night and increase to 2 tablets at night as needed for back pain or leg pain  60 capsule  3  . traMADol (ULTRAM) 50 MG tablet Take 1 tablet (50 mg total) by mouth every 8 (eight) hours as needed.  30 tablet  0  . zoster vaccine live, PF, (ZOSTAVAX) 71062 UNT/0.65ML injection Inject 19,400 Units into the skin once.  1 each  0   No current facility-administered medications on file prior to visit.   Allergies  Allergen Reactions  . Penicillins     rash    HPI HPI Comments: Nicholas West is a 58 y.o. male who presents to the [facility] complaining of laceration on the nose onset 2 hours ago. Pt reports cutting his nose with a razor while shaving. Pt reports the bleeding didn't stop so he decided to come in. Pt denies washing his wound with soap and water.  Tetanus UTD.  Review of Systems  Constitutional: Negative for fever and chills.  Respiratory: Negative for cough and shortness of breath.   Gastrointestinal: Negative for nausea and vomiting.  Musculoskeletal: Negative for neck pain.  Skin: Positive for wound (laceration on nose). Negative for color change.  Psychiatric/Behavioral: Negative for behavioral problems.  All other systems reviewed and are negative.  Objective:   Physical Exam  Nursing note and vitals reviewed. Constitutional: He is oriented to person, place, and time. He appears well-developed and well-nourished. No distress.  HENT:  Head: Normocephalic and atraumatic.  Eyes: Conjunctivae are normal. No scleral icterus.  Neck: Normal range of motion.  Cardiovascular: Normal rate, regular rhythm, normal heart sounds and intact distal pulses.   No murmur heard. Capillary refill < 3 sec  Pulmonary/Chest: Effort normal and breath sounds normal. No respiratory distress.  Musculoskeletal: Normal range of motion. He exhibits no edema.  ROM nml  Neurological: He is  alert and oriented to person, place, and time.  Sensation:nml Strength:nml  Skin: Skin is warm and dry. He is not diaphoretic.  Psychiatric: He has a normal mood and affect.    Triage Vitals: BP 120/78  Pulse 61  Temp(Src) 97.8 F (36.6 C) (Oral)  Resp 16  Ht 6' (1.829 m)  Wt 188 lb 6 oz (85.446 kg)  BMI 25.54 kg/m2  SpO2 98%  Very superficial, hemostatic left alla approximately 5 mm treated Drisol x2, cleansed with soap and water, applied Drisol again. Dressed with Bactroban and lesion remained hemostatic. Assessment & Plan:  10:41 AM- Patient informed of current plan for treatment and evaluation and agrees with plan at this time.  Accidental puncture or laceration during procedure, not elsewhere classified  No orders of the defined types were placed in this encounter.    I personally performed the services described in this documentation, which was scribed in my presence. The recorded information has been reviewed and considered, and addended by me as needed.  Delman Cheadle, MD MPH

## 2014-04-19 ENCOUNTER — Other Ambulatory Visit: Payer: Self-pay | Admitting: Cardiology

## 2014-04-30 ENCOUNTER — Encounter: Payer: Self-pay | Admitting: Emergency Medicine

## 2014-05-16 ENCOUNTER — Other Ambulatory Visit: Payer: Self-pay | Admitting: Emergency Medicine

## 2014-05-28 ENCOUNTER — Encounter: Payer: BC Managed Care – PPO | Admitting: Emergency Medicine

## 2014-06-01 ENCOUNTER — Ambulatory Visit (INDEPENDENT_AMBULATORY_CARE_PROVIDER_SITE_OTHER): Payer: BC Managed Care – PPO | Admitting: Emergency Medicine

## 2014-06-01 ENCOUNTER — Encounter: Payer: Self-pay | Admitting: Emergency Medicine

## 2014-06-01 VITALS — BP 111/73 | HR 62 | Temp 98.6°F | Resp 16 | Ht 71.5 in | Wt 190.6 lb

## 2014-06-01 DIAGNOSIS — Z0000 Encounter for general adult medical examination without abnormal findings: Secondary | ICD-10-CM

## 2014-06-01 DIAGNOSIS — L989 Disorder of the skin and subcutaneous tissue, unspecified: Secondary | ICD-10-CM

## 2014-06-01 DIAGNOSIS — E291 Testicular hypofunction: Secondary | ICD-10-CM

## 2014-06-01 DIAGNOSIS — Z23 Encounter for immunization: Secondary | ICD-10-CM

## 2014-06-01 LAB — CBC WITH DIFFERENTIAL/PLATELET
Basophils Absolute: 0 10*3/uL (ref 0.0–0.1)
Basophils Relative: 0 % (ref 0–1)
Eosinophils Absolute: 0.1 10*3/uL (ref 0.0–0.7)
Eosinophils Relative: 2 % (ref 0–5)
HCT: 43.9 % (ref 39.0–52.0)
Hemoglobin: 16 g/dL (ref 13.0–17.0)
Lymphocytes Relative: 30 % (ref 12–46)
Lymphs Abs: 1.5 10*3/uL (ref 0.7–4.0)
MCH: 30.4 pg (ref 26.0–34.0)
MCHC: 36.4 g/dL — ABNORMAL HIGH (ref 30.0–36.0)
MCV: 83.5 fL (ref 78.0–100.0)
Monocytes Absolute: 0.5 10*3/uL (ref 0.1–1.0)
Monocytes Relative: 11 % (ref 3–12)
Neutro Abs: 2.8 10*3/uL (ref 1.7–7.7)
Neutrophils Relative %: 57 % (ref 43–77)
Platelets: 142 10*3/uL — ABNORMAL LOW (ref 150–400)
RBC: 5.26 MIL/uL (ref 4.22–5.81)
RDW: 13.3 % (ref 11.5–15.5)
WBC: 4.9 10*3/uL (ref 4.0–10.5)

## 2014-06-01 LAB — POCT URINALYSIS DIPSTICK
Bilirubin, UA: NEGATIVE
Blood, UA: NEGATIVE
Glucose, UA: NEGATIVE
Ketones, UA: NEGATIVE
Leukocytes, UA: NEGATIVE
Nitrite, UA: NEGATIVE
Protein, UA: NEGATIVE
Spec Grav, UA: 1.02
Urobilinogen, UA: 0.2
pH, UA: 7

## 2014-06-01 LAB — COMPLETE METABOLIC PANEL WITH GFR
ALT: 32 U/L (ref 0–53)
AST: 32 U/L (ref 0–37)
Albumin: 4.4 g/dL (ref 3.5–5.2)
Alkaline Phosphatase: 67 U/L (ref 39–117)
BUN: 9 mg/dL (ref 6–23)
CO2: 27 mEq/L (ref 19–32)
Calcium: 9.5 mg/dL (ref 8.4–10.5)
Chloride: 103 mEq/L (ref 96–112)
Creat: 0.91 mg/dL (ref 0.50–1.35)
GFR, Est African American: >89 mL/min
GFR, Est Non African American: >89 mL/min
Glucose, Bld: 79 mg/dL (ref 70–99)
Potassium: 4.2 mEq/L (ref 3.5–5.3)
Sodium: 141 mEq/L (ref 135–145)
Total Bilirubin: 0.7 mg/dL (ref 0.2–1.2)
Total Protein: 7 g/dL (ref 6.0–8.3)

## 2014-06-01 LAB — LIPID PANEL
Cholesterol: 177 mg/dL (ref 0–200)
HDL: 47 mg/dL (ref >39)
LDL Cholesterol: 112 mg/dL — ABNORMAL HIGH (ref 0–99)
Total CHOL/HDL Ratio: 3.8 Ratio
Triglycerides: 90 mg/dL (ref <150)
VLDL: 18 mg/dL (ref 0–40)

## 2014-06-01 LAB — IFOBT (OCCULT BLOOD): IFOBT: NEGATIVE

## 2014-06-01 MED ORDER — ZOSTER VACCINE LIVE 19400 UNT/0.65ML ~~LOC~~ SOLR: 0.6500 mL | Freq: Once | SUBCUTANEOUS | Status: DC

## 2014-06-01 NOTE — Progress Notes (Signed)
@[email or web address] @  Patient ID: Nicholas West MRN: [id number], DOB: March 21, 1956 58 y.o. Date of Encounter: 06/01/2014, 8:59 AM  Primary Physician: Jenny Reichmann, MD  Chief Complaint: Physical (CPE)  HPI: 58 y.o. y/o male with history noted below here for CPE.  Doing well. No issues/complaints.  Review of Systems: Consitutional: No fever, chills, fatigue, night sweats, lymphadenopathy, or weight changes. Eyes: No visual changes, eye redness, or discharge. ENT/Mouth: Ears: No otalgia, tinnitus, hearing loss, discharge. Nose: No congestion, rhinorrhea, sinus pain, or epistaxis. Throat: No sore throat, post nasal drip, or teeth pain. Cardiovascular: No CP, palpitations, diaphoresis, DOE, edema, orthopnea, PND.he has a history of atrial fibrillation but has had no problems with this Respiratory: No cough, hemoptysis, SOB, or wheezing. Gastrointestinal: No anorexia, dysphagia, reflux, pain, nausea, vomiting, hematemesis, diarrhea,  BRBPR, or melena.he has chronic constipation and has been to see Dr. Michail Sermon. He is currently on Dulcolax and MiraLAX. Genitourinary: No dysuria, frequency, urgency, hematuria, incontinence, nocturia, decreased urinary stream, discharge, impotence, or testicular pain/masses.he has a history of ED with decreased libido and is currently on Cialis and testosterone replacement. Musculoskeletal: No decreased ROM, myalgias, stiffness, joint swelling, or weakness. Skin: No rash, erythema, lesion changes, pain, warmth, jaundice, or pruritis. Neurological: No headache, dizziness, syncope, seizures, tremors, memory loss, coordination problems, or paresthesias. Psychological: No anxiety, depression, hallucinations, SI/HI. Endocrine: No fatigue, polydipsia, polyphagia, polyuria, or known diabetes. All other systems were reviewed and are otherwise negative. He has a mole on his left leg he would like checked Past Medical History  Diagnosis Date  . Abdominal pain   . Diverticular  disease   . Diverticulitis   . GERD (gastroesophageal reflux disease)   . Chest pain     negative stress echo in September 2013  . Atrial fibrillation      Past Surgical History  Procedure Laterality Date  . Partial colectomy  11/11  . Colon surgery      Home Meds:  Prior to Admission medications   Medication Sig Start Date End Date Taking? Authorizing Provider  ALFALFA PO Take by mouth See admin instructions. Shaklee Alfalfa tabs 2.85 g alfalfa powder, 300 mg calcium   Yes Historical Provider, MD  Ascorbic Acid (VITAMIN C PO) Take 500 mg by mouth daily.   Yes Historical Provider, MD  aspirin 81 MG tablet Take 81 mg by mouth daily.   Yes Historical Provider, MD  AXIRON 30 MG/ACT SOLN PLACE 2 PUMPS ONTO THE SKIN DAILY. NEEDS OFFICE VISIT FOR ADDITION REFILLS   Yes [name], MD  B Complex Vitamins (B COMPLEX PO) Take 1 tablet by mouth daily. With folic acid   Yes Historical Provider, MD  calcium carbonate (OS-CAL - DOSED IN MG OF ELEMENTAL CALCIUM) 1250 MG tablet Take 1 tablet by mouth daily with breakfast.   Yes Historical Provider, MD  cholecalciferol (VITAMIN D) 1000 UNITS tablet Take 1,000 Units by mouth 2 (two) times daily.   Yes Historical Provider, MD  CIALIS 20 MG tablet TAKE ONE-HALF TO ONE TABLET BY MOUTH EVERY OTHER DAY AS NEEDED FOR ERECTILE DYSFUNCTION 05/18/14  Yes [name], MD  diltiazem (CARDIZEM CD) 180 MG 24 hr capsule TAKE ONE CAPSULE BY MOUTH ONCE DAILY 04/20/14  Yes [name], MD  Garlic 4536 MG CAPS Take 1,000 mg by mouth daily.   Yes Historical Provider, MD  Multiple Vitamin (MULTIVITAMIN) tablet Take 1 tablet by mouth daily.   Yes Historical Provider, MD  Omega-3 Fatty Acids (OMEGA 3 PO) Take  1 capsule by mouth daily.    Yes Historical Provider, MD  omeprazole (PRILOSEC) 20 MG capsule Take 1 capsule (20 mg total) by mouth daily. 03/05/12  Yes [name], MD  tacrolimus (PROTOPIC) 0.1 % ointment Apply 1 application topically daily.    Yes Historical  Provider, MD  gabapentin (NEURONTIN) 300 MG capsule Take one tablet at night and increase to 2 tablets at night as needed for back pain or leg pain 11/18/13   [name], MD  traMADol (ULTRAM) 50 MG tablet Take 1 tablet (50 mg total) by mouth every 8 (eight) hours as needed. 11/18/13   [name], MD  zoster vaccine live, PF, (ZOSTAVAX) 93790 UNT/0.65ML injection Inject 19,400 Units into the skin once. 06/14/13   [name], MD    Allergies:  Allergies  Allergen Reactions  . Penicillins     rash    History   Social History  . Marital Status: Married    Spouse Name: N/A    Number of Children: 0  . Years of Education: N/A   Occupational History  . attorney    Social History Main Topics  . Smoking status: Never Smoker   . Smokeless tobacco: Never Used  . Alcohol Use: No  . Drug Use: No  . Sexual Activity: Yes   Other Topics Concern  . Not on file   Social History Narrative    Family History  Problem Relation Age of Onset  . Cancer Mother     Physical Exam: Blood pressure 111/73, pulse 62, temperature 98.6 F (37 C), temperature source Oral, resp. rate 16, height 5' 11.5" (1.816 m), weight 190 lb 9.6 oz (86.456 kg), SpO2 93 %.  General: Well developed, well nourished, in no acute distress. HEENT: Normocephalic, atraumatic. Conjunctiva pink, sclera non-icteric. Pupils 2 mm constricting to 1 mm, round, regular, and equally reactive to light and accomodation. EOMI. Internal auditory canal clear. TMs with good cone of light and without pathology. Nasal mucosa pink. Nares are without discharge. No sinus tenderness. Oral mucosa pink. Dentition. Pharynx without exudate.   Neck: Supple. Trachea midline. No thyromegaly. Full ROM. No lymphadenopathy. Lungs: Clear to auscultation bilaterally without wheezes, rales, or rhonchi. Breathing is of normal effort and unlabored. Cardiovascular: RRR with S1 S2. No murmurs, rubs, or gallops appreciated. Distal pulses 2+ symmetrically.  No carotid or abdominal bruits. Abdomen: Soft, non-tender, non-distended with normoactive bowel sounds. No hepatosplenomegaly or masses. No rebound/guarding. No CVA tenderness. Without hernias.  Rectal: No external hemorrhoids or fissures. Rectal vault without masses.  Genitourinary:  circumcised male. No penile lesions. Testes descended bilaterally, and smooth without tenderness or masses. There is a 1-1/2 cm cystic area above the left testicle. Musculoskeletal: Full range of motion and 5/5 strength throughout. Without swelling, atrophy, tenderness, crepitus, or warmth. Extremities without clubbing, cyanosis, or edema. Calves supple. Skin: Warm and moist without erythema, ecchymosis, wounds, or rashthere is a 4 mm raised red area left upper lateral leg.Marland Kitchen Neuro: A+Ox3. CN II-XII grossly intact. Moves all extremities spontaneously. Full sensation throughout. Normal gait. DTR 2+ throughout upper and lower extremities. Finger to nose intact. Psych:  Responds to questions appropriately with a normal affect.   Results for orders placed or performed in visit on 06/01/14  POCT urinalysis dipstick  Result Value Ref Range   Color, UA yellow    Clarity, UA clear    Glucose, UA neg    Bilirubin, UA neg    Ketones, UA neg  Spec Grav, UA 1.020    Blood, UA neg    pH, UA 7.0    Protein, UA neg    Urobilinogen, UA 0.2    Nitrite, UA neg    Leukocytes, UA Negative   IFOBT POC (occult bld, rslt in office)  Result Value Ref Range   IFOBT Negative    Assessment/Plan:  58 y.o. y/o white male here for his physical. Flu shot will be given today. He has a cardiologist who follows him for his atrial fibrillation. He overall is doing well. He needs to exercise more. His routine labs were done todayhe was given a prescription for Zostavax. -  Signed, Nena Jordan, MD 06/01/2014 8:59 AM

## 2014-06-02 LAB — TESTOSTERONE, FREE, TOTAL, SHBG
Sex Hormone Binding: 24 nmol/L (ref 13–71)
Testosterone, Free: 51.8 pg/mL (ref 47.0–244.0)
Testosterone-% Free: 2.3 % (ref 1.6–2.9)
Testosterone: 227 ng/dL — ABNORMAL LOW (ref 300–890)

## 2014-06-02 LAB — PSA: PSA: 1.62 ng/mL (ref <=4.00)

## 2014-06-29 ENCOUNTER — Other Ambulatory Visit: Payer: Self-pay | Admitting: Emergency Medicine

## 2014-06-29 NOTE — Telephone Encounter (Signed)
Faxed

## 2014-07-17 ENCOUNTER — Other Ambulatory Visit: Payer: Self-pay | Admitting: *Deleted

## 2014-07-17 MED ORDER — DILTIAZEM HCL ER COATED BEADS 180 MG PO CP24: 180.0000 mg | ORAL_CAPSULE | Freq: Every day | ORAL | Status: DC

## 2014-07-20 ENCOUNTER — Telehealth: Payer: Self-pay | Admitting: Cardiology

## 2014-07-20 NOTE — Telephone Encounter (Signed)
Pt wanted to verify Cardizem and Diltiazem were same medicine.  Also noted color change from previous pill. Informed pt that pill variation could be due to different manufacturer, the pharmacy would be best place to verify accuracy of med. Pt voiced understanding, no other concerns stated at this time.

## 2014-07-20 NOTE — Telephone Encounter (Signed)
Please call,question about his medicine he just received from the pharmacy.

## 2014-08-10 ENCOUNTER — Telehealth: Payer: Self-pay | Admitting: Family Medicine

## 2014-08-10 NOTE — Telephone Encounter (Signed)
lmom about his new appt on 12/22/14 at 9:00 the old appt on 12-03-14 was cancelled

## 2014-10-14 ENCOUNTER — Other Ambulatory Visit: Payer: Self-pay

## 2014-10-14 MED ORDER — DILTIAZEM HCL ER COATED BEADS 180 MG PO CP24: 180.0000 mg | ORAL_CAPSULE | Freq: Every day | ORAL | Status: DC

## 2014-11-06 ENCOUNTER — Ambulatory Visit (INDEPENDENT_AMBULATORY_CARE_PROVIDER_SITE_OTHER): Payer: BLUE CROSS/BLUE SHIELD | Admitting: Cardiology

## 2014-11-06 ENCOUNTER — Encounter: Payer: Self-pay | Admitting: Cardiology

## 2014-11-06 VITALS — BP 120/72 | HR 66 | Ht 71.0 in | Wt 198.9 lb

## 2014-11-06 DIAGNOSIS — E7800 Pure hypercholesterolemia, unspecified: Secondary | ICD-10-CM

## 2014-11-06 DIAGNOSIS — I480 Paroxysmal atrial fibrillation: Secondary | ICD-10-CM | POA: Diagnosis not present

## 2014-11-06 NOTE — Patient Instructions (Signed)
Continue your current therapy  I will see you in one year   

## 2014-11-06 NOTE — Progress Notes (Signed)
Nicholas West Date of Birth: June 20, 1956 Medical Record #[id number]  History of Present Illness: Nicholas West is seen back today for a followup visit.  He has a history of paroxysmal atrial fibrillation. He has been managed with rate control with diltiazem. He has a [place] vascular score of 0. He is taking a baby aspirin daily.  He's had a few episodes of palpitations particularly in the early morning hours. He usually just goes back to sleep. No chest pain or dyspnea. No episodes during the day.  Current Outpatient Prescriptions on File Prior to Visit  Medication Sig Dispense Refill  . ALFALFA PO Take by mouth See admin instructions. Shaklee Alfalfa tabs 2.85 g alfalfa powder, 300 mg calcium    . Ascorbic Acid (VITAMIN C PO) Take 500 mg by mouth daily.    Marland Kitchen aspirin 81 MG tablet Take 81 mg by mouth daily.    Hinda Kehr 30 MG/ACT SOLN PLACE 2 PUMPS ONTO THE SKIN DAILY. NEEDS OFFICE VISIT FOR ADDITIONAL REFILLS 90 mL 5  . calcium carbonate (OS-CAL - DOSED IN MG OF ELEMENTAL CALCIUM) 1250 MG tablet Take 1 tablet by mouth daily with breakfast.    . cholecalciferol (VITAMIN D) 1000 UNITS tablet Take 1,000 Units by mouth 2 (two) times daily.    Marland Kitchen CIALIS 20 MG tablet TAKE ONE-HALF TO ONE TABLET BY MOUTH EVERY OTHER DAY AS NEEDED FOR ERECTILE DYSFUNCTION 5 tablet 0  . diltiazem (CARDIZEM CD) 180 MG 24 hr capsule Take 1 capsule (180 mg total) by mouth daily. 90 capsule 0  . Garlic 7517 MG CAPS Take 1,000 mg by mouth daily.    . Multiple Vitamin (MULTIVITAMIN) tablet Take 1 tablet by mouth daily.    . Omega-3 Fatty Acids (OMEGA 3 PO) Take 1 capsule by mouth daily.     Marland Kitchen omeprazole (PRILOSEC) 20 MG capsule Take 1 capsule (20 mg total) by mouth daily. 30 capsule 11  . tacrolimus (PROTOPIC) 0.1 % ointment Apply 1 application topically daily.     Marland Kitchen zoster vaccine live, PF, (ZOSTAVAX) 00174 UNT/0.65ML injection Inject 19,400 Units into the skin once. 1 each 0  . zoster vaccine live, PF, (ZOSTAVAX) 94496  UNT/0.65ML injection Inject 19,400 Units into the skin once. 1 each 0   No current facility-administered medications on file prior to visit.    Allergies  Allergen Reactions  . Penicillins     rash    Past Medical History  Diagnosis Date  . Abdominal pain   . Diverticular disease   . Diverticulitis   . GERD (gastroesophageal reflux disease)   . Chest pain     negative stress echo in September 2013  . Atrial fibrillation     Past Surgical History  Procedure Laterality Date  . Partial colectomy  11/11  . Colon surgery      History  Smoking status  . Never Smoker   Smokeless tobacco  . Never Used    History  Alcohol Use No    Family History  Problem Relation Age of Onset  . Cancer Mother   . Alzheimer's disease Father   . Colon polyps Father   . GER disease Father     Review of Systems: The review of systems is per the HPI.  All other systems were reviewed and are negative.  Physical Exam: BP 120/72 mmHg  Pulse 66  Ht 5\' 11"  (1.803 m)  Wt 198 lb 14.4 oz (90.22 kg)  BMI 27.75 kg/m2 Patient is very pleasant  and in no acute distress. Skin is warm and dry. Color is normal.  HEENT is unremarkable. Normocephalic/atraumatic. PERRL. Sclera are nonicteric. Neck is supple. No masses. No JVD. Lungs are clear. Cardiac exam shows a regular rate and rhythm. Abdomen is soft. Extremities are without edema. Gait and ROM are intact. No gross neurologic deficits noted.  LABORATORY DATA:   Lab Results  Component Value Date   WBC 4.9 06/01/2014   HGB 16.0 06/01/2014   HCT 43.9 06/01/2014   PLT 142* 06/01/2014   GLUCOSE 79 06/01/2014   CHOL 177 06/01/2014   TRIG 90 06/01/2014   HDL 47 06/01/2014   LDLCALC 112* 06/01/2014   ALT 32 06/01/2014   AST 32 06/01/2014   NA 141 06/01/2014   K 4.2 06/01/2014   CL 103 06/01/2014   CREATININE 0.91 06/01/2014   BUN 9 06/01/2014   CO2 27 06/01/2014   TSH 2.092 08/28/2013   PSA 1.62 06/01/2014   INR 1.10 06/04/2013   Ecg  today shows NSR with normal Ecg. I have personally reviewed and interpreted this study.    Assessment / Plan: 1. Paroxysmal atrial fibrillation. Patient is minimally symptomatic. He has a [place] and [place] vascular score of 0.  Continue baby aspirin daily.  Since his symptoms are fairly minor I would recommend continued rate control strategy over rhythm control.  2. Mild HLD   3. Prior normal stress echo and Echo  Follow up in one year

## 2014-12-03 ENCOUNTER — Ambulatory Visit: Payer: BC Managed Care – PPO | Admitting: Emergency Medicine

## 2014-12-08 ENCOUNTER — Ambulatory Visit: Payer: Self-pay | Admitting: Emergency Medicine

## 2014-12-22 ENCOUNTER — Ambulatory Visit: Payer: Self-pay | Admitting: Emergency Medicine

## 2015-01-07 ENCOUNTER — Other Ambulatory Visit: Payer: Self-pay | Admitting: Emergency Medicine

## 2015-01-07 ENCOUNTER — Encounter: Payer: Self-pay | Admitting: Emergency Medicine

## 2015-01-07 ENCOUNTER — Ambulatory Visit (INDEPENDENT_AMBULATORY_CARE_PROVIDER_SITE_OTHER): Payer: BLUE CROSS/BLUE SHIELD | Admitting: Emergency Medicine

## 2015-01-07 VITALS — BP 127/65 | HR 64 | Temp 98.9°F | Resp 16 | Ht 71.5 in | Wt 192.6 lb

## 2015-01-07 DIAGNOSIS — N528 Other male erectile dysfunction: Secondary | ICD-10-CM

## 2015-01-07 DIAGNOSIS — E291 Testicular hypofunction: Secondary | ICD-10-CM | POA: Diagnosis not present

## 2015-01-07 DIAGNOSIS — E785 Hyperlipidemia, unspecified: Secondary | ICD-10-CM | POA: Diagnosis not present

## 2015-01-07 DIAGNOSIS — I4891 Unspecified atrial fibrillation: Secondary | ICD-10-CM | POA: Diagnosis not present

## 2015-01-07 LAB — BASIC METABOLIC PANEL WITH GFR
BUN: 16 mg/dL (ref 6–23)
CO2: 28 mEq/L (ref 19–32)
Calcium: 9.6 mg/dL (ref 8.4–10.5)
Chloride: 103 mEq/L (ref 96–112)
Creat: 0.93 mg/dL (ref 0.50–1.35)
GFR, Est African American: >89 mL/min
GFR, Est Non African American: 89 mL/min
Glucose, Bld: 84 mg/dL (ref 70–99)
Potassium: 4.2 mEq/L (ref 3.5–5.3)
Sodium: 141 mEq/L (ref 135–145)

## 2015-01-07 LAB — LIPID PANEL
Cholesterol: 162 mg/dL (ref 0–200)
HDL: 30 mg/dL — ABNORMAL LOW (ref >=40)
LDL Cholesterol: 98 mg/dL (ref 0–99)
Total CHOL/HDL Ratio: 5.4 Ratio
Triglycerides: 171 mg/dL — ABNORMAL HIGH (ref <150)
VLDL: 34 mg/dL (ref 0–40)

## 2015-01-07 NOTE — Progress Notes (Signed)
Subjective:  This chart was scribed for Nicholas Russian, MD by Ladene Artist, ED Scribe. The patient was seen in room 22. Patient's care was started at 2:01 PM.   Patient ID: Nicholas West, male    DOB: 12/25/1955, 59 y.o.   MRN: [id number]  Chief Complaint  Patient presents with  . Erectile Dysfunction  . hypercholestoremia  . Hypogonadism   Erectile Dysfunction   HPI Comments: Nicholas West is a 59 y.o. male who presents to the [facility] for a follow-up regarding hypogonadism. Pt reports that he has been taking testosterone, one on each side, in the mornings. He suspects that his energy levels have increased, but he still reports occasionally feeling fatigue. Pt denies loss of appetite.   Erectile Dysfunction Pt has been trying Cialis for ED without relief. He requests to be referred to an urologist at this time.  Pt plans to travel to the mountains in a few week and then to [place], [place] for his 32nd anniversary.  Past Medical History  Diagnosis Date  . Abdominal pain   . Diverticular disease   . Diverticulitis   . GERD (gastroesophageal reflux disease)   . Chest pain     negative stress echo in September 2013  . Atrial fibrillation    Current Outpatient Prescriptions on File Prior to Visit  Medication Sig Dispense Refill  . ALFALFA PO Take by mouth See admin instructions. Shaklee Alfalfa tabs 2.85 g alfalfa powder, 300 mg calcium    . Ascorbic Acid (VITAMIN C PO) Take 500 mg by mouth daily.    Marland Kitchen aspirin 81 MG tablet Take 81 mg by mouth daily.    Hinda Kehr 30 MG/ACT SOLN PLACE 2 PUMPS ONTO THE SKIN DAILY. NEEDS OFFICE VISIT FOR ADDITIONAL REFILLS 90 mL 5  . calcium carbonate (OS-CAL - DOSED IN MG OF ELEMENTAL CALCIUM) 1250 MG tablet Take 1 tablet by mouth daily with breakfast.    . cholecalciferol (VITAMIN D) 1000 UNITS tablet Take 1,000 Units by mouth 2 (two) times daily.    Marland Kitchen CIALIS 20 MG tablet TAKE ONE-HALF TO ONE TABLET BY MOUTH EVERY OTHER DAY  AS NEEDED FOR ERECTILE DYSFUNCTION 5 tablet 0  . diltiazem (CARDIZEM CD) 180 MG 24 hr capsule Take 1 capsule (180 mg total) by mouth daily. 90 capsule 0  . Garlic 9024 MG CAPS Take 1,000 mg by mouth daily.    . Multiple Vitamin (MULTIVITAMIN) tablet Take 1 tablet by mouth daily.    . Omega-3 Fatty Acids (OMEGA 3 PO) Take 1 capsule by mouth daily.     Marland Kitchen omeprazole (PRILOSEC) 20 MG capsule Take 1 capsule (20 mg total) by mouth daily. 30 capsule 11  . tacrolimus (PROTOPIC) 0.1 % ointment Apply 1 application topically daily.     Marland Kitchen zoster vaccine live, PF, (ZOSTAVAX) 09735 UNT/0.65ML injection Inject 19,400 Units into the skin once. (Patient not taking: Reported on 01/07/2015) 1 each 0  . zoster vaccine live, PF, (ZOSTAVAX) 32992 UNT/0.65ML injection Inject 19,400 Units into the skin once. (Patient not taking: Reported on 01/07/2015) 1 each 0   No current facility-administered medications on file prior to visit.   Allergies  Allergen Reactions  . Penicillins     rash   Review of Systems  Constitutional: Positive for fatigue (improved). Negative for appetite change.   BP 127/65 mmHg  Pulse 64  Temp(Src) 98.9 F (37.2 C) (Oral)  Resp 16  Ht 5' 11.5" (1.816 m)  Wt  192 lb 9.6 oz (87.363 kg)  BMI 26.49 kg/m2  SpO2 100%    Objective:   Physical Exam  Constitutional: He is oriented to person, place, and time. He appears well-developed and well-nourished. No distress.  HENT:  Head: Normocephalic and atraumatic.  Eyes: Conjunctivae and EOM are normal.  Neck: Neck supple. No tracheal deviation present.  Cardiovascular: Normal rate, regular rhythm and normal heart sounds.   Pulmonary/Chest: Effort normal and breath sounds normal. No respiratory distress.  Musculoskeletal: Normal range of motion.  Neurological: He is alert and oriented to person, place, and time.  Skin: Skin is warm and dry.  Psychiatric: He has a normal mood and affect. His behavior is normal.  Nursing note and vitals  reviewed.     Assessment & Plan:   1. Hypogonadism male Continue ask her on twice a day. He may want to stop the medication in October if it financially is too much - Testosterone, Free, Total, SHBG; Future - BASIC METABOLIC PANEL WITH GFR - PSA - Ambulatory referral to Urology  2. Atrial fibrillation, unspecified No recent problems - BASIC METABOLIC PANEL WITH GFR  3. Hyperlipidemia  - Lipid panel  4. Other male erectile dysfunction He should have a difficulty with getting a firm erection. Referral made - Ambulatory referral to Urology   I personally performed the services described in this documentation, which was scribed in my presence. The recorded information has been reviewed and is accurate.  [name], MD  [facility]  01/07/2015 2:13 PM

## 2015-01-07 NOTE — Progress Notes (Signed)
Subjective:  This chart was scribed for [name], MD by Ladene Artist, ED Scribe. The patient was seen in room 22. Patient's care was started at 2:01 PM.   Patient ID: Nicholas West, male    DOB: [birth year], 59 y.o.   MRN: [id number]  Chief Complaint  Patient presents with   Erectile Dysfunction   hypercholestoremia   Hypogonadism   HPI HPI Comments: Nicholas West is a 59 y.o. male who presents to the [facility] for a follow-up regarding hypogonadism. Pt reports that he has been taking testosterone, one on each side, in the mornings. He suspects that his energy levels have increased, but he still reports occasionally feeling fatigue. Pt denies loss of appetite.   Erectile Dysfunction Pt has been trying Cialis for ED without relief. He requests to be referred to an urologist at this time.  Pt plans to travel to the mountains in a few week and then to [place], [place] for his 32nd anniversary.  Past Medical History  Diagnosis Date   Abdominal pain    Diverticular disease    Diverticulitis    GERD (gastroesophageal reflux disease)    Chest pain     negative stress echo in September 2013   Atrial fibrillation    Current Outpatient Prescriptions on File Prior to Visit  Medication Sig Dispense Refill   ALFALFA PO Take by mouth See admin instructions. Shaklee Alfalfa tabs 2.85 g alfalfa powder, 300 mg calcium     Ascorbic Acid (VITAMIN C PO) Take 500 mg by mouth daily.     aspirin 81 MG tablet Take 81 mg by mouth daily.     AXIRON 30 MG/ACT SOLN PLACE 2 PUMPS ONTO THE SKIN DAILY. NEEDS OFFICE VISIT FOR ADDITIONAL REFILLS 90 mL 5   calcium carbonate (OS-CAL - DOSED IN MG OF ELEMENTAL CALCIUM) 1250 MG tablet Take 1 tablet by mouth daily with breakfast.     cholecalciferol (VITAMIN D) 1000 UNITS tablet Take 1,000 Units by mouth 2 (two) times daily.     CIALIS 20 MG tablet TAKE ONE-HALF TO ONE TABLET BY MOUTH EVERY OTHER DAY AS NEEDED FOR  ERECTILE DYSFUNCTION 5 tablet 0   diltiazem (CARDIZEM CD) 180 MG 24 hr capsule Take 1 capsule (180 mg total) by mouth daily. 90 capsule 0   Garlic 5102 MG CAPS Take 1,000 mg by mouth daily.     Multiple Vitamin (MULTIVITAMIN) tablet Take 1 tablet by mouth daily.     Omega-3 Fatty Acids (OMEGA 3 PO) Take 1 capsule by mouth daily.      omeprazole (PRILOSEC) 20 MG capsule Take 1 capsule (20 mg total) by mouth daily. 30 capsule 11   tacrolimus (PROTOPIC) 0.1 % ointment Apply 1 application topically daily.      zoster vaccine live, PF, (ZOSTAVAX) 58527 UNT/0.65ML injection Inject 19,400 Units into the skin once. (Patient not taking: Reported on 01/07/2015) 1 each 0   zoster vaccine live, PF, (ZOSTAVAX) 78242 UNT/0.65ML injection Inject 19,400 Units into the skin once. (Patient not taking: Reported on 01/07/2015) 1 each 0   No current facility-administered medications on file prior to visit.   Allergies  Allergen Reactions   Penicillins     rash   Review of Systems  Constitutional: Positive for fatigue (improved). Negative for appetite change.   BP 127/65 mmHg   Pulse 64   Temp(Src) 98.9 F (37.2 C) (Oral)   Resp 16   Ht 5' 11.5" (1.816 m)  Wt 192 lb 9.6 oz (87.363 kg)   BMI 26.49 kg/m2   SpO2 100%    Objective:   Physical Exam  Constitutional: He is oriented to person, place, and time. He appears well-developed and well-nourished. No distress.  HENT:  Head: Normocephalic and atraumatic.  Eyes: Conjunctivae and EOM are normal.  Neck: Neck supple. No tracheal deviation present.  Cardiovascular: Normal rate, regular rhythm and normal heart sounds.   Pulmonary/Chest: Effort normal and breath sounds normal. No respiratory distress.  Musculoskeletal: Normal range of motion.  Neurological: He is alert and oriented to person, place, and time.  Skin: Skin is warm and dry.  Psychiatric: He has a normal mood and affect. His behavior is normal.  Nursing note and vitals reviewed.       Assessment & Plan:   1. Hypogonadism male Continue ask her on twice a day. He may want to stop the medication in October if it financially is too much - Testosterone, Free, Total, SHBG; Future - BASIC METABOLIC PANEL WITH GFR - PSA - Ambulatory referral to Urology  2. Atrial fibrillation, unspecified No recent problems - BASIC METABOLIC PANEL WITH GFR  3. Hyperlipidemia  - Lipid panel  4. Other male erectile dysfunction He should have a difficulty with getting a firm erection. Referral made - Ambulatory referral to Urology   I personally performed the services described in this documentation, which was scribed in my presence. The recorded information has been reviewed and is accurate.  [name], MD  [facility]  01/07/2015 2:12 PM

## 2015-01-07 NOTE — Progress Notes (Signed)
   Subjective:    Patient ID: Nicholas West, male    DOB: 1955/10/12, 59 y.o.   MRN: [id number]  HPI    Review of Systems     Objective:   Physical Exam        Assessment & Plan:

## 2015-01-08 LAB — PSA: PSA: 1.58 ng/mL (ref <=4.00)

## 2015-01-11 LAB — TESTOSTERONE, FREE, TOTAL, SHBG
Sex Hormone Binding: 21 nmol/L — ABNORMAL LOW (ref 22–77)
Testosterone, Free: 65 pg/mL (ref 47.0–244.0)
Testosterone-% Free: 2.5 % (ref 1.6–2.9)
Testosterone: 264 ng/dL — ABNORMAL LOW (ref 300–890)

## 2015-01-12 ENCOUNTER — Encounter: Payer: Self-pay | Admitting: Emergency Medicine

## 2015-01-13 ENCOUNTER — Encounter: Payer: Self-pay | Admitting: Emergency Medicine

## 2015-01-14 ENCOUNTER — Other Ambulatory Visit: Payer: Self-pay | Admitting: *Deleted

## 2015-01-14 MED ORDER — DILTIAZEM HCL ER COATED BEADS 180 MG PO CP24: 180.0000 mg | ORAL_CAPSULE | Freq: Every day | ORAL | Status: DC

## 2015-01-21 ENCOUNTER — Other Ambulatory Visit: Payer: Self-pay | Admitting: Emergency Medicine

## 2015-02-08 ENCOUNTER — Other Ambulatory Visit: Payer: Self-pay | Admitting: Dermatology

## 2015-02-14 ENCOUNTER — Encounter: Payer: Self-pay | Admitting: Cardiology

## 2015-02-22 ENCOUNTER — Telehealth: Payer: Self-pay | Admitting: Cardiology

## 2015-02-22 NOTE — Telephone Encounter (Signed)
Verbally answered questions for a life insurance questionnaire for the patient about atrial fib: Last EKG, Echo, Stress test and Monitor:  Results given of most recent of each. Is he symptomatic? No Reason he has A.Fib?  Unknown  Patient voiced appreciation.

## 2015-02-22 NOTE — Telephone Encounter (Signed)
Please call,needs questions answered for a life insurance questionnaire.

## 2015-03-16 ENCOUNTER — Other Ambulatory Visit: Payer: Self-pay | Admitting: Cardiology

## 2015-03-16 ENCOUNTER — Encounter: Payer: Self-pay | Admitting: Cardiology

## 2015-03-16 ENCOUNTER — Encounter: Payer: Self-pay | Admitting: Emergency Medicine

## 2015-03-16 MED ORDER — DILTIAZEM HCL ER COATED BEADS 180 MG PO CP24: 180.0000 mg | ORAL_CAPSULE | Freq: Every day | ORAL | Status: DC

## 2015-05-01 ENCOUNTER — Encounter: Payer: Self-pay | Admitting: Emergency Medicine

## 2015-05-04 ENCOUNTER — Telehealth: Payer: Self-pay

## 2015-05-04 ENCOUNTER — Encounter: Payer: Self-pay | Admitting: Emergency Medicine

## 2015-05-04 NOTE — Telephone Encounter (Signed)
I've designated Dr. Everlene Farrier as my primary care physician with [facility], which has approved the designation.         [employer] requires a referral from my primary care physician to any specialist.        I have an appointment with Dr. Peter Martinique [facility]) on October 18. Dr. Everlene Farrier previously referred me to Dr. Martinique for my a-fib.        I have an appointment with Dr. Preston Fleeting [facility]) on November 23. Dr. Everlene Farrier previously referred me to Dr. Diona Fanti for treatment.        The purpose of this contact is to ask Dr. Everlene Farrier to make the necessary referrals to [facility] so that I have no problems with insurance coverage when I visit Dr. Martinique and Dr. Diona Fanti.        My member i.d. # with [employer] is [id number].        My next appointment with Dr. Everlene Farrier is November 17 for my annual physical.        Thanks!         Dr. Martinique use diagnosis Atrial fibrillation [facility]) [I48.91]   Dr. Teresa Pelton use diagnosis code E29.1     Nicholas West

## 2015-05-06 ENCOUNTER — Ambulatory Visit (INDEPENDENT_AMBULATORY_CARE_PROVIDER_SITE_OTHER): Payer: 59 | Admitting: *Deleted

## 2015-05-06 DIAGNOSIS — Z23 Encounter for immunization: Secondary | ICD-10-CM | POA: Diagnosis not present

## 2015-06-17 ENCOUNTER — Encounter: Payer: BLUE CROSS/BLUE SHIELD | Admitting: Emergency Medicine

## 2015-07-15 ENCOUNTER — Telehealth: Payer: Self-pay

## 2015-07-15 NOTE — Telephone Encounter (Signed)
Received a call from patient.He stated he needed a copy of 11/06/14 EKG for life insurance.Stated copy he received earlier was a bad copy.Copy of 11/06/14 EKG left at [facility] office for pick up.

## 2015-08-02 ENCOUNTER — Encounter: Payer: Self-pay | Admitting: Emergency Medicine

## 2015-08-02 ENCOUNTER — Ambulatory Visit (INDEPENDENT_AMBULATORY_CARE_PROVIDER_SITE_OTHER): Payer: BLUE CROSS/BLUE SHIELD | Admitting: Emergency Medicine

## 2015-08-02 ENCOUNTER — Ambulatory Visit (INDEPENDENT_AMBULATORY_CARE_PROVIDER_SITE_OTHER): Payer: BLUE CROSS/BLUE SHIELD

## 2015-08-02 VITALS — BP 118/68 | HR 107 | Temp 98.6°F | Resp 17 | Ht 72.5 in | Wt 193.0 lb

## 2015-08-02 DIAGNOSIS — R05 Cough: Secondary | ICD-10-CM

## 2015-08-02 DIAGNOSIS — R059 Cough, unspecified: Secondary | ICD-10-CM

## 2015-08-02 LAB — POCT INFLUENZA A/B
Influenza A, POC: NEGATIVE
Influenza B, POC: NEGATIVE

## 2015-08-02 LAB — POCT CBC
Granulocyte percent: 77.8 %G (ref 37–80)
HCT, POC: 49.6 % (ref 43.5–53.7)
Hemoglobin: 17.2 g/dL (ref 14.1–18.1)
Lymph, poc: 1.6 (ref 0.6–3.4)
MCH, POC: 28.7 pg (ref 27–31.2)
MCHC: 34.6 g/dL (ref 31.8–35.4)
MCV: 82.9 fL (ref 80–97)
MID (cbc): 0.7 (ref 0–0.9)
MPV: 6.2 fL (ref 0–99.8)
POC Granulocyte: 8.2 — AB (ref 2–6.9)
POC LYMPH PERCENT: 15.2 %L (ref 10–50)
POC MID %: 7 %M (ref 0–12)
Platelet Count, POC: 166 10*3/uL (ref 142–424)
RBC: 5.98 M/uL (ref 4.69–6.13)
RDW, POC: 12.9 %
WBC: 10.5 10*3/uL — AB (ref 4.6–10.2)

## 2015-08-02 LAB — POCT RAPID STREP A (OFFICE): Rapid Strep A Screen: NEGATIVE

## 2015-08-02 IMAGING — CR DG CHEST 2V
3 series · 3 of 3 positions shown · non-contrast
Comparison: [DATE] plain film and CT.

CLINICAL DATA: Cough.  Congestion.  Short of breath for 5 days.

EXAM:
CHEST  2 VIEW

[PA (1 of 2)]
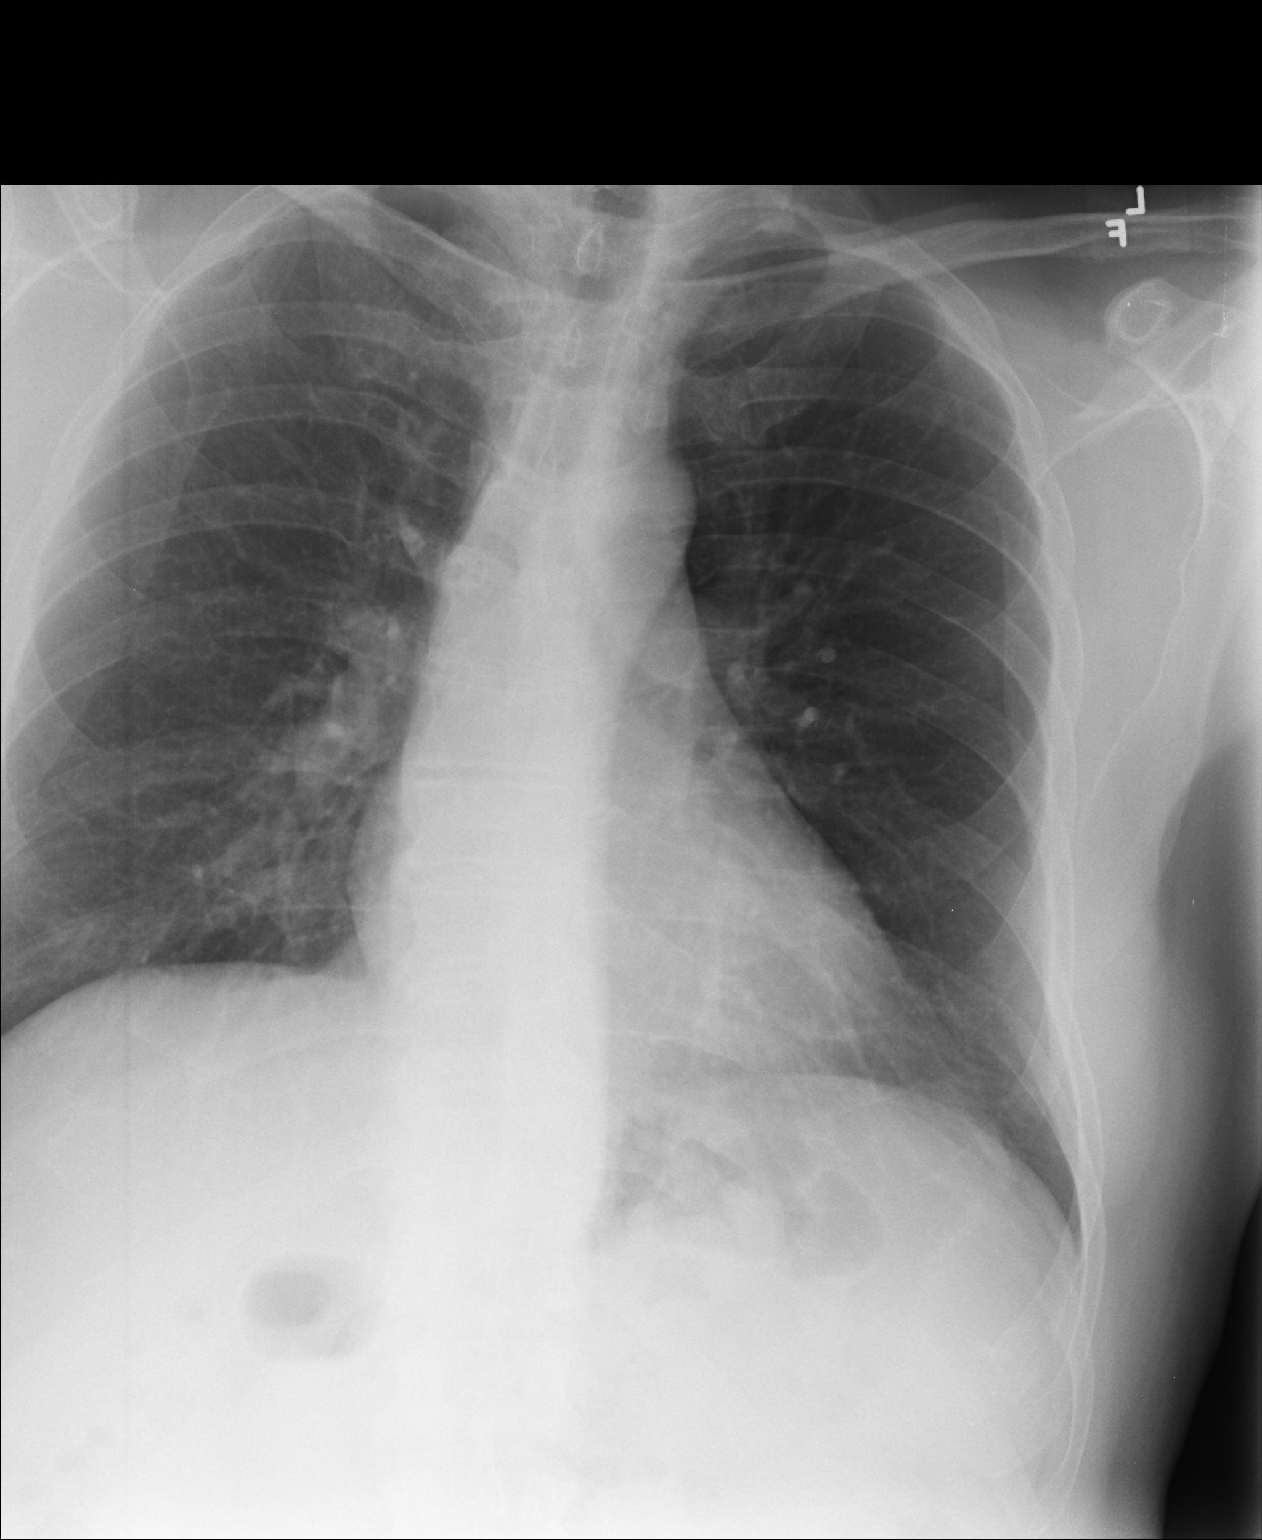

[lateral]
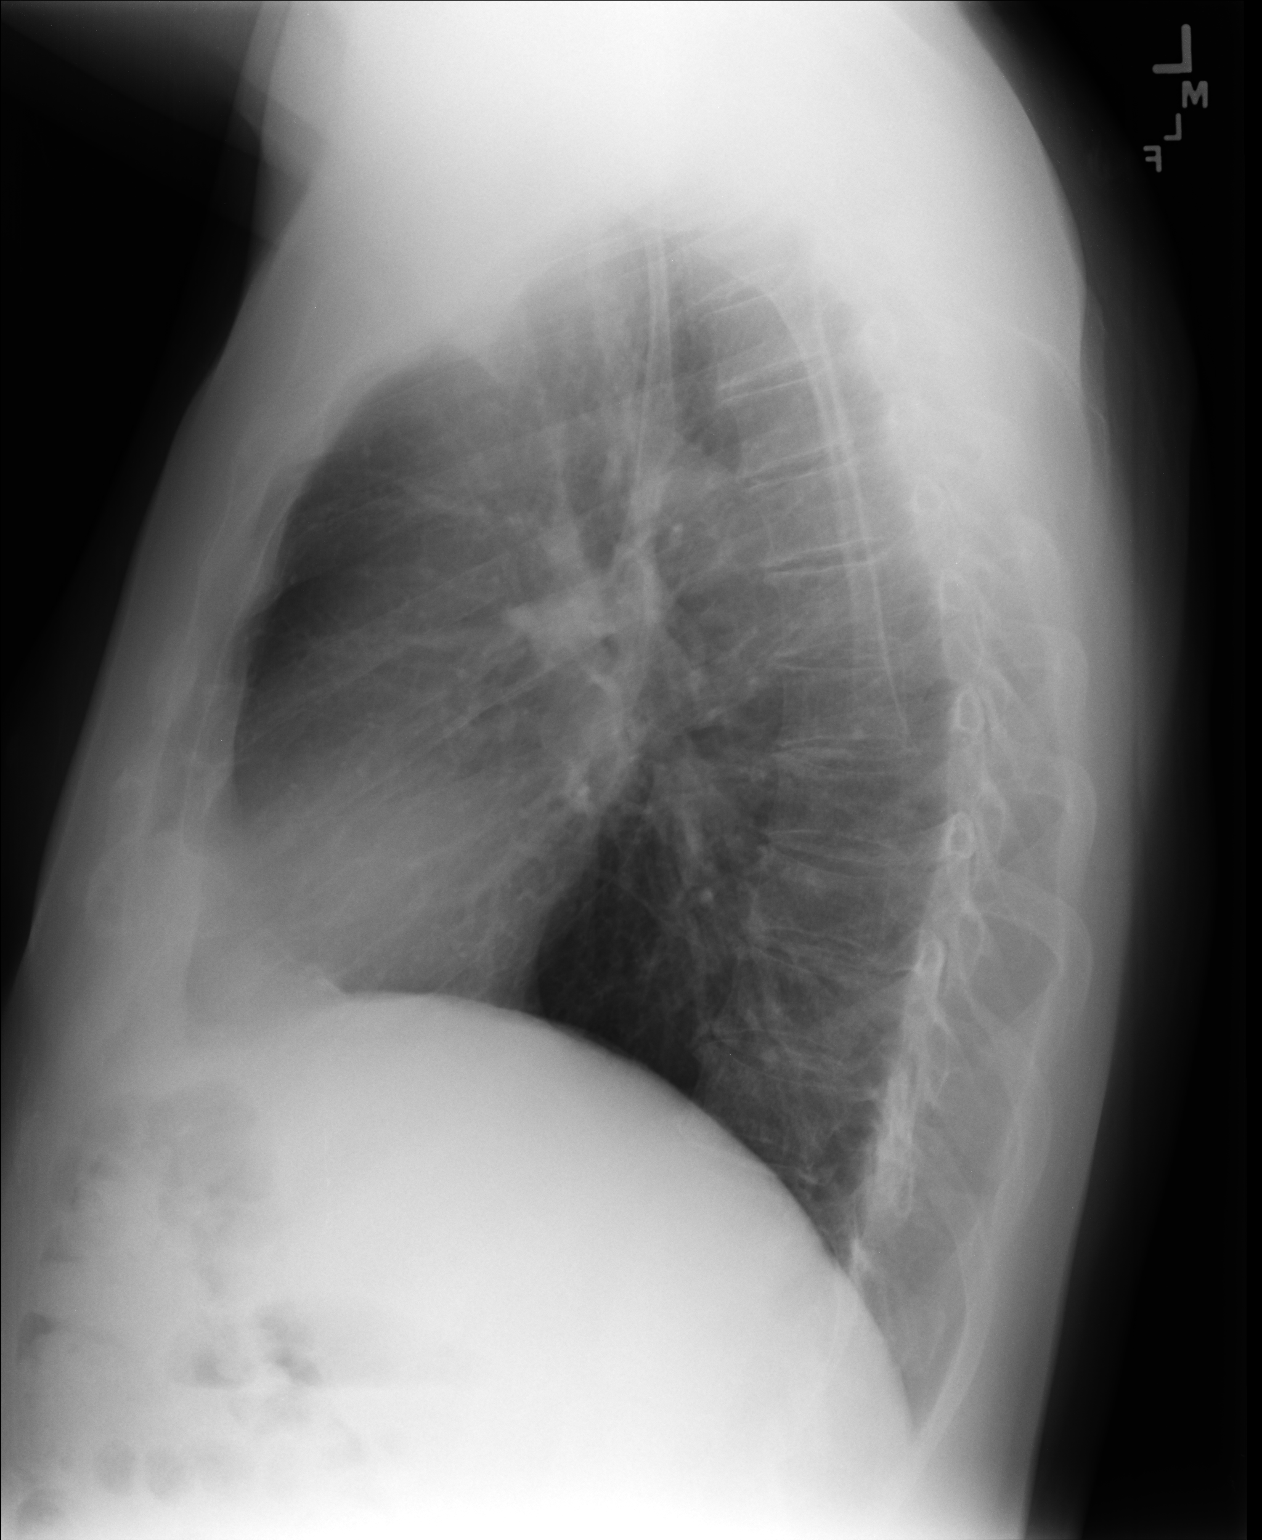

[PA (2 of 2)]
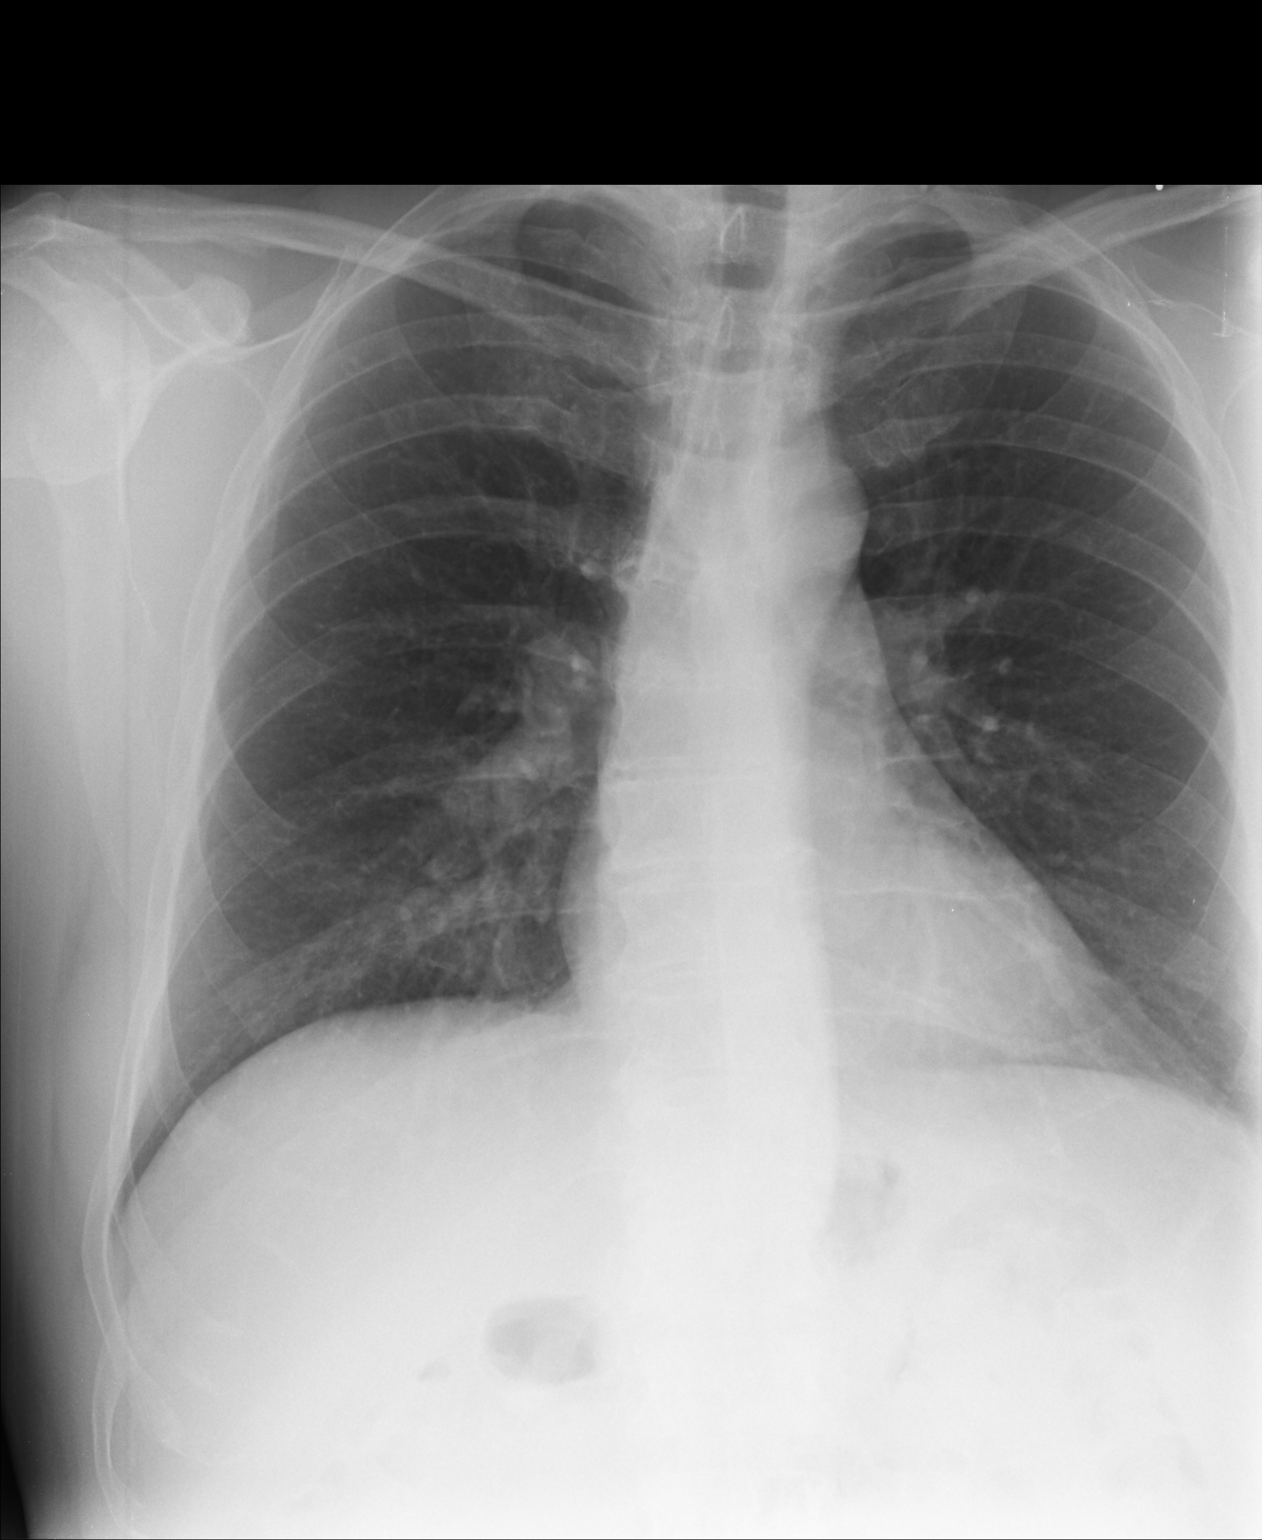

[3 of 3 positions shown; findings below may reference images not displayed]

FINDINGS: Midline trachea. Normal heart size and mediastinal contours. No
pleural effusion or pneumothorax.
IMPRESSION: No acute cardiopulmonary disease.

## 2015-08-02 MED ORDER — BENZONATATE 100 MG PO CAPS: 100.0000 mg | ORAL_CAPSULE | Freq: Three times a day (TID) | ORAL | Status: DC | PRN

## 2015-08-02 MED ORDER — AZITHROMYCIN 250 MG PO TABS: ORAL_TABLET | ORAL | Status: DC

## 2015-08-02 NOTE — Patient Instructions (Signed)

## 2015-08-02 NOTE — Progress Notes (Addendum)
Patient ID: Nicholas West, male   DOB: 1956-04-21, 60 y.o.   MRN: [id number]     By signing my name below, I, Zola Button, attest that this documentation has been prepared under the direction and in the presence of [name], MD.  Electronically Signed: Zola Button, Medical Scribe. 08/02/2015. 2:59 PM.   Chief Complaint:  Chief Complaint  Patient presents with  . Nasal Congestion  . Sore Throat  . Sinusitis    HPI: Nicholas West is a 60 y.o. male who reports to [facility] today complaining of gradual onset cough that started 3 days ago, but worsened 2 days ago. Patient reports having associated congestion, sore throat, generalized myalgias, and fatigue. He denies wheezing. He also denies history of smoking. Patient notes that his great-nephews and great-nieces had strep throat, but they were cleared by the doctor and told they were not contagious by the time he saw them. He did receive the flu shot this year.  Past Medical History  Diagnosis Date  . Abdominal pain   . Diverticular disease   . Diverticulitis   . GERD (gastroesophageal reflux disease)   . Chest pain     negative stress echo in September 2013  . Atrial fibrillation [facility])    Past Surgical History  Procedure Laterality Date  . Partial colectomy  11/11  . Colon surgery     Social History   Social History  . Marital Status: Married    Spouse Name: N/A  . Number of Children: 0  . Years of Education: N/A   Occupational History  . attorney    Social History Main Topics  . Smoking status: Never Smoker   . Smokeless tobacco: Never Used  . Alcohol Use: No  . Drug Use: No  . Sexual Activity: Yes   Other Topics Concern  . None   Social History Narrative   Family History  Problem Relation Age of Onset  . Cancer Mother   . Alzheimer's disease Father   . Colon polyps Father   . GER disease Father    Allergies  Allergen Reactions  . Penicillins     rash   Prior to Admission medications   Medication Sig  Start Date End Date Taking? Authorizing Provider  ALFALFA PO Take by mouth See admin instructions. Shaklee Alfalfa tabs 2.85 g alfalfa powder, 300 mg calcium    Historical Provider, MD  Ascorbic Acid (VITAMIN C PO) Take 500 mg by mouth daily.    Historical Provider, MD  aspirin 81 MG tablet Take 81 mg by mouth daily.    Historical Provider, MD  calcium carbonate (OS-CAL - DOSED IN MG OF ELEMENTAL CALCIUM) 1250 MG tablet Take 1 tablet by mouth daily with breakfast.    Historical Provider, MD  cholecalciferol (VITAMIN D) 1000 UNITS tablet Take 1,000 Units by mouth 2 (two) times daily.    Historical Provider, MD  CIALIS 20 MG tablet TAKE ONE-HALF TO ONE TABLET BY MOUTH EVERY OTHER DAY AS NEEDED FOR ERECTILE DYSFUNCTION 05/18/14   [name], MD  diltiazem (CARDIZEM CD) 180 MG 24 hr capsule Take 1 capsule (180 mg total) by mouth daily. 03/16/15   [name], MD  Garlic 123XX123 MG CAPS Take 1,000 mg by mouth daily.    Historical Provider, MD  Multiple Vitamin (MULTIVITAMIN) tablet Take 1 tablet by mouth daily.    Historical Provider, MD  Omega-3 Fatty Acids (OMEGA 3 PO) Take 1 capsule by mouth daily.  Historical Provider, MD  omeprazole (PRILOSEC) 20 MG capsule Take 1 capsule (20 mg total) by mouth daily. 03/05/12   [name], MD  tacrolimus (PROTOPIC) 0.1 % ointment Apply 1 application topically daily.     Historical Provider, MD  Testosterone (AXIRON) 30 MG/ACT SOLN Place 2 pumps onto the skin daily. 01/21/15   [name], MD  zoster vaccine live, PF, (ZOSTAVAX) 91478 UNT/0.65ML injection Inject 19,400 Units into the skin once. Patient not taking: Reported on 01/07/2015 06/14/13   [name], MD  zoster vaccine live, PF, (ZOSTAVAX) 29562 UNT/0.65ML injection Inject 19,400 Units into the skin once. Patient not taking: Reported on 01/07/2015 06/01/14   [name], MD     ROS: The patient denies fevers, chills, night sweats, unintentional weight loss, chest pain, palpitations, wheezing,  dyspnea on exertion, nausea, vomiting, abdominal pain, dysuria, hematuria, melena, numbness, weakness, or tingling.   All other systems have been reviewed and were otherwise negative with the exception of those mentioned in the HPI and as above.    PHYSICAL EXAM: Filed Vitals:   08/02/15 1456  BP: 118/68  Pulse: 107  Temp: 98.6 F (37 C)  Resp: 17   Body mass index is 25.8 kg/(m^2).   General: Alert, no acute distress HEENT:  Normocephalic, atraumatic, oropharynx patent. Significant nasal congestion. Wax in left external auditory canal. Right ear normal. Throat slightly red. Eye: Juliette Mangle [facility] Cardiovascular:  Regular rate and rhythm, no rubs murmurs or gallops.  No Carotid bruits, radial pulse intact. No pedal edema.  Respiratory: Clear to auscultation bilaterally.  No wheezes, rales, or rhonchi.  No cyanosis, no use of accessory musculature Abdominal: No organomegaly, abdomen is soft and non-tender, positive bowel sounds.  No masses. Musculoskeletal: Gait intact. No edema, tenderness Skin: No rashes. Neurologic: Facial musculature symmetric. Psychiatric: Patient acts appropriately throughout our interaction. Lymphatic: No cervical or submandibular lymphadenopathy    LABS: Results for orders placed or performed in visit on 08/02/15  POCT rapid strep A  Result Value Ref Range   Rapid Strep A Screen Negative Negative  POCT CBC  Result Value Ref Range   WBC 10.5 (A) 4.6 - 10.2 K/uL   Lymph, poc 1.6 0.6 - 3.4   POC LYMPH PERCENT 15.2 10 - 50 %L   MID (cbc) 0.7 0 - 0.9   POC MID % 7.0 0 - 12 %M   POC Granulocyte 8.2 (A) 2 - 6.9   Granulocyte percent 77.8 37 - 80 %G   RBC 5.98 4.69 - 6.13 M/uL   Hemoglobin 17.2 14.1 - 18.1 g/dL   HCT, POC 49.6 43.5 - 53.7 %   MCV 82.9 80 - 97 fL   MCH, POC 28.7 27 - 31.2 pg   MCHC 34.6 31.8 - 35.4 g/dL   RDW, POC 12.9 %   Platelet Count, POC 166 142 - 424 K/uL   MPV 6.2 0 - 99.8 fL  .  EKG/XRAY:   Primary read interpreted by Dr.  Everlene Farrier at [facility]. Minimal airspace disease right base.   ASSESSMENT/PLAN: Patient presents with fever productive cough. He never no medical or   Gross sideeffects, risk and benefits, and alternatives of medications d/w patient. Patient is aware that all medications have potential sideeffects and we are unable to predict every sideeffect or drug-drug interaction that may occur.  [name] MD 08/02/2015 2:59 PM

## 2015-10-07 ENCOUNTER — Other Ambulatory Visit: Payer: Self-pay | Admitting: Emergency Medicine

## 2015-10-07 ENCOUNTER — Encounter: Payer: Self-pay | Admitting: Emergency Medicine

## 2015-10-07 ENCOUNTER — Ambulatory Visit (INDEPENDENT_AMBULATORY_CARE_PROVIDER_SITE_OTHER): Payer: BLUE CROSS/BLUE SHIELD | Admitting: Emergency Medicine

## 2015-10-07 VITALS — BP 110/82 | HR 69 | Temp 98.2°F | Resp 16 | Ht 71.5 in | Wt 187.0 lb

## 2015-10-07 DIAGNOSIS — E785 Hyperlipidemia, unspecified: Secondary | ICD-10-CM

## 2015-10-07 DIAGNOSIS — E291 Testicular hypofunction: Secondary | ICD-10-CM

## 2015-10-07 DIAGNOSIS — I4891 Unspecified atrial fibrillation: Secondary | ICD-10-CM | POA: Diagnosis not present

## 2015-10-07 DIAGNOSIS — Z0000 Encounter for general adult medical examination without abnormal findings: Secondary | ICD-10-CM

## 2015-10-07 DIAGNOSIS — E559 Vitamin D deficiency, unspecified: Secondary | ICD-10-CM | POA: Diagnosis not present

## 2015-10-07 LAB — COMPLETE METABOLIC PANEL WITH GFR
ALT: 31 U/L (ref 9–46)
AST: 29 U/L (ref 10–35)
Albumin: 4.4 g/dL (ref 3.6–5.1)
Alkaline Phosphatase: 47 U/L (ref 40–115)
BUN: 20 mg/dL (ref 7–25)
CO2: 29 mmol/L (ref 20–31)
Calcium: 9.2 mg/dL (ref 8.6–10.3)
Chloride: 100 mmol/L (ref 98–110)
Creat: 1.06 mg/dL (ref 0.70–1.25)
GFR, Est African American: 87 mL/min (ref >=60)
GFR, Est Non African American: 75 mL/min (ref >=60)
Glucose, Bld: 78 mg/dL (ref 65–99)
Potassium: 4.1 mmol/L (ref 3.5–5.3)
Sodium: 139 mmol/L (ref 135–146)
Total Bilirubin: 0.8 mg/dL (ref 0.2–1.2)
Total Protein: 7.2 g/dL (ref 6.1–8.1)

## 2015-10-07 LAB — CBC
HCT: 49.6 % (ref 39.0–52.0)
Hemoglobin: 16.8 g/dL (ref 13.0–17.0)
MCH: 28 pg (ref 26.0–34.0)
MCHC: 33.9 g/dL (ref 30.0–36.0)
MCV: 82.8 fL (ref 78.0–100.0)
MPV: 9.5 fL (ref 8.6–12.4)
Platelets: 161 K/uL (ref 150–400)
RBC: 5.99 MIL/uL — ABNORMAL HIGH (ref 4.22–5.81)
RDW: 13.5 % (ref 11.5–15.5)
WBC: 5.7 K/uL (ref 4.0–10.5)

## 2015-10-07 LAB — LIPID PANEL
Cholesterol: 164 mg/dL (ref 125–200)
HDL: 45 mg/dL (ref >=40)
LDL Cholesterol: 107 mg/dL (ref <130)
Total CHOL/HDL Ratio: 3.6 Ratio (ref <=5.0)
Triglycerides: 60 mg/dL (ref <150)
VLDL: 12 mg/dL (ref <30)

## 2015-10-07 LAB — POC MICROSCOPIC URINALYSIS (UMFC): Mucus: ABSENT

## 2015-10-07 LAB — POCT URINALYSIS DIP (MANUAL ENTRY)
Blood, UA: NEGATIVE
Glucose, UA: NEGATIVE
Leukocytes, UA: NEGATIVE
Nitrite, UA: NEGATIVE
Spec Grav, UA: 1.02
Urobilinogen, UA: 0.2
pH, UA: 6

## 2015-10-07 LAB — TSH: TSH: 2.86 mIU/L (ref 0.40–4.50)

## 2015-10-07 NOTE — Addendum Note (Signed)
Addended by: Elie Confer on: 10/07/2015 09:23 AM   Modules accepted: Orders

## 2015-10-07 NOTE — Patient Instructions (Addendum)

## 2015-10-07 NOTE — Progress Notes (Signed)
Patient ID: Nicholas West, male   DOB: 08/28/1955, 60 y.o.   MRN: [id number]   By signing my name below, I, Nicholas West, attest that this documentation has been prepared under the direction and in the presence of [name], MD Electronically Signed: Ladene Artist, ED Scribe 10/07/2015 at 8:53 AM.  Chief Complaint:  Chief Complaint  Patient presents with  . Annual Exam   HPI: Nicholas West is a 60 y.o. male who reports to [name] today for an annual exam.   GI Pt states that he occasionally has constipation that he has controlled with a daily stool softener. He reports approximately 4-5 episodes since partial colectomy in 2011. He states that he is consuming a large amount of water but wonders if he is getting enough fluids. Pt has a h/o diverticulitis.   Cardiology Pt has a h/o a-fib. He reports occasional chest palpitations but states that his heart is doing well.  Preventative Maintenance  Pt sees an eye doctor annually.   Pt plans to travel to [facility] in May for a family gathering.   Past Medical History  Diagnosis Date  . Abdominal pain   . Diverticular disease   . Diverticulitis   . GERD (gastroesophageal reflux disease)   . Chest pain     negative stress echo in September 2013  . Atrial fibrillation [name])    Past Surgical History  Procedure Laterality Date  . Partial colectomy  11/11  . Colon surgery     Social History   Social History  . Marital Status: Married    Spouse Name: N/A  . Number of Children: 0  . Years of Education: N/A   Occupational History  . attorney    Social History Main Topics  . Smoking status: Never Smoker   . Smokeless tobacco: Never Used  . Alcohol Use: No  . Drug Use: No  . Sexual Activity: Yes   Other Topics Concern  . Not on file   Social History Narrative   Family History  Problem Relation Age of Onset  . Cancer Mother   . Alzheimer's disease Father   . Colon polyps Father   . GER disease Father    Allergies    Allergen Reactions  . Penicillins     rash   Prior to Admission medications   Medication Sig Start Date End Date Taking? Authorizing Provider  ALFALFA PO Take by mouth See admin instructions. Shaklee Alfalfa tabs 2.85 g alfalfa powder, 300 mg calcium    Historical Provider, MD  Ascorbic Acid (VITAMIN C PO) Take 500 mg by mouth daily.    Historical Provider, MD  aspirin 81 MG tablet Take 81 mg by mouth daily.    Historical Provider, MD  azithromycin (ZITHROMAX) 250 MG tablet Take 2 tabs PO x 1 dose, then 1 tab PO QD x 4 days 08/02/15   [name], MD  benzonatate (TESSALON) 100 MG capsule Take 1-2 capsules (100-200 mg total) by mouth 3 (three) times daily as needed for cough. 08/02/15   [name], MD  calcium carbonate (OS-CAL - DOSED IN MG OF ELEMENTAL CALCIUM) 1250 MG tablet Take 1 tablet by mouth daily with breakfast.    Historical Provider, MD  cholecalciferol (VITAMIN D) 1000 UNITS tablet Take 1,000 Units by mouth 2 (two) times daily.    Historical Provider, MD  CIALIS 20 MG tablet TAKE ONE-HALF TO ONE TABLET BY MOUTH EVERY OTHER DAY AS NEEDED FOR ERECTILE DYSFUNCTION 05/18/14  [name], MD  diltiazem (CARDIZEM CD) 180 MG 24 hr capsule Take 1 capsule (180 mg total) by mouth daily. 03/16/15   [name], MD  Garlic 123XX123 MG CAPS Take 1,000 mg by mouth daily.    Historical Provider, MD  Multiple Vitamin (MULTIVITAMIN) tablet Take 1 tablet by mouth daily.    Historical Provider, MD  Omega-3 Fatty Acids (OMEGA 3 PO) Take 1 capsule by mouth daily.     Historical Provider, MD  omeprazole (PRILOSEC) 20 MG capsule Take 1 capsule (20 mg total) by mouth daily. 03/05/12   [name], MD  tacrolimus (PROTOPIC) 0.1 % ointment Apply 1 application topically daily.     Historical Provider, MD  Testosterone (AXIRON) 30 MG/ACT SOLN Place 2 pumps onto the skin daily. Patient not taking: Reported on 08/02/2015 01/21/15   [name], MD  testosterone cypionate (DEPOTESTOSTERONE CYPIONATE) 200  MG/ML injection Inject into the muscle every 14 (fourteen) days.    Historical Provider, MD  zoster vaccine live, PF, (ZOSTAVAX) 13086 UNT/0.65ML injection Inject 19,400 Units into the skin once. Patient not taking: Reported on 01/07/2015 06/14/13   [name], MD  zoster vaccine live, PF, (ZOSTAVAX) 57846 UNT/0.65ML injection Inject 19,400 Units into the skin once. Patient not taking: Reported on 01/07/2015 06/01/14   [name], MD   ROS: The patient denies fevers, chills, night sweats, unintentional weight loss, chest pain, wheezing, dyspnea on exertion, nausea, vomiting, abdominal pain, dysuria, hematuria, melena, numbness, weakness, or tingling. +hearing loss, +palpitations, +joint swelling (finger), +constipation,   All other systems have been reviewed and were otherwise negative with the exception of those mentioned in the HPI and as above.    PHYSICAL EXAM: Filed Vitals:   10/07/15 0832  BP: 110/82  Pulse: 69  Temp: 98.2 F (36.8 C)  Resp: 16   Body mass index is 25.72 kg/(m^2).  General: Alert, no acute distress HEENT:  Normocephalic, atraumatic, oropharynx patent. 1x1.5 cm fleshy growth on top of head Eye: [facility] Cardiovascular:  Regular rate and rhythm, no rubs murmurs or gallops. No Carotid bruits, radial pulse intact. No pedal edema.  Respiratory: Clear to auscultation bilaterally. No wheezes, rales, or rhonchi. No cyanosis, no use of accessory musculature Abdominal: No organomegaly, abdomen is soft and non-tender, positive bowel sounds. No masses. Musculoskeletal: Gait intact. No edema, tenderness GU: external hemorrhoids noted Skin: No rashes. Neurologic: Facial musculature symmetric. Psychiatric: Patient acts appropriately throughout our interaction. Lymphatic: No cervical or submandibular lymphadenopathy  LABS:  EKG/XRAY:   Primary read interpreted by Dr. Everlene Farrier at [facility].  ASSESSMENT/PLAN:   This patient is in good health. He maintains a healthy life  skull. I encouraged him to exercise. The bowels with constipation off and on and has been to the GI specialist about this. He does have a benign fleshy mole on his scalp he can consider having removed but it is not essential. No changes in medication at the present time.I personally performed the services described in this documentation, which was scribed in my presence. The recorded information has been reviewed and is accurate.  Gross sideeffects, risk and benefits, and alternatives of medications d/w patient. Patient is aware that all medications have potential sideeffects and we are unable to predict every sideeffect or drug-drug interaction that may occur.  [name] MD 10/07/2015 8:27 AM

## 2015-10-08 LAB — TESTOSTERONE, FREE AND TOTAL (INCLUDES SHBG)-(MALES)
Sex Hormone Binding: 21 nmol/L — ABNORMAL LOW (ref 22–77)
Testosterone, Free: 226.2 pg/mL (ref 47.0–244.0)
Testosterone-% Free: 2.8 % (ref 1.6–2.9)
Testosterone: 797 ng/dL (ref 250–827)

## 2015-10-08 LAB — VITAMIN D 25 HYDROXY (VIT D DEFICIENCY, FRACTURES): Vit D, 25-Hydroxy: 47 ng/mL (ref 30–100)

## 2015-10-15 ENCOUNTER — Telehealth: Payer: Self-pay | Admitting: Emergency Medicine

## 2015-10-15 DIAGNOSIS — E291 Testicular hypofunction: Secondary | ICD-10-CM

## 2015-10-15 DIAGNOSIS — Z0000 Encounter for general adult medical examination without abnormal findings: Secondary | ICD-10-CM

## 2015-10-15 NOTE — Addendum Note (Signed)
Addended by: Wyatt Haste on: 10/15/2015 08:06 AM   Modules accepted: Miquel Dunn

## 2015-10-15 NOTE — Telephone Encounter (Signed)
Nika from [place] called this morning and stated that [facility] called the other day to add on a PSA to the blood that they already had.  She stated that the tube got discarded before the PSA could be run.  If you want the pt to have the PSA he will need to come in and be redrawn.  Thank you.

## 2015-10-16 NOTE — Telephone Encounter (Signed)
Please call patient and apologized. Have him come in for a PSA at 104 on Thursday. Put  this in as a orders only encounter.

## 2015-10-17 NOTE — Telephone Encounter (Signed)
See previous note

## 2015-10-18 LAB — JAK2 MUT. V617F QUANT, LEUMETA: JAK2 Mut,QN,Leumeta: NEGATIVE pg/uL

## 2015-10-18 LAB — PSA

## 2015-10-20 NOTE — Telephone Encounter (Signed)
lmom to cb.  Needs to come back in for lab only.

## 2015-10-21 NOTE — Telephone Encounter (Signed)
Pt notified and will be back in.

## 2015-10-28 ENCOUNTER — Telehealth: Payer: Self-pay | Admitting: Emergency Medicine

## 2015-10-28 NOTE — Telephone Encounter (Signed)
The patient was supposed to come by for PSA level. I cannot see that this was ever done. Please check on this.

## 2015-10-29 ENCOUNTER — Other Ambulatory Visit (INDEPENDENT_AMBULATORY_CARE_PROVIDER_SITE_OTHER): Payer: BLUE CROSS/BLUE SHIELD

## 2015-10-29 DIAGNOSIS — E291 Testicular hypofunction: Secondary | ICD-10-CM

## 2015-10-29 DIAGNOSIS — Z0000 Encounter for general adult medical examination without abnormal findings: Secondary | ICD-10-CM

## 2015-10-29 NOTE — Telephone Encounter (Signed)
Spoke with pt, advised him to come in for lab.

## 2015-10-30 LAB — PSA: PSA: 1.38 ng/mL (ref <=4.00)

## 2015-11-01 LAB — TESTOSTERONE TOTAL,FREE,BIO, MALES
Albumin: 4.4 g/dL (ref 3.6–5.1)
Sex Hormone Binding: 22 nmol/L (ref 22–77)
Testosterone, Bioavailable: 251.5 ng/dL (ref 130.5–681.7)
Testosterone, Free: 124.9 pg/mL (ref 47.0–244.0)
Testosterone: 635 ng/dL (ref 250–827)

## 2015-11-08 ENCOUNTER — Encounter: Payer: Self-pay | Admitting: Cardiology

## 2015-11-08 ENCOUNTER — Ambulatory Visit (INDEPENDENT_AMBULATORY_CARE_PROVIDER_SITE_OTHER): Payer: BLUE CROSS/BLUE SHIELD | Admitting: Cardiology

## 2015-11-08 VITALS — BP 118/56 | HR 62 | Ht 72.0 in | Wt 192.2 lb

## 2015-11-08 DIAGNOSIS — I480 Paroxysmal atrial fibrillation: Secondary | ICD-10-CM

## 2015-11-08 NOTE — Patient Instructions (Signed)
Continue your current therapy  I will see you in one year   

## 2015-11-08 NOTE — Progress Notes (Signed)
Nicholas West Date of Birth: [birth year] Medical Record [id number]  History of Present Illness: Nicholas West is seen for follow up of atrial fibrillation.   He has a history of paroxysmal atrial fibrillation. He has been managed with rate control with diltiazem. He has a [place] vascular score of 0. He is taking a baby aspirin daily.   On follow up today he is doing well. He has rare episodes of Afib usually in the early morning. He usually just goes back to sleep. These episodes do not last long. No significant symptoms during the day. No chest pain or dyspnea.   Current Outpatient Prescriptions on File Prior to Visit  Medication Sig Dispense Refill  . ALFALFA PO Take by mouth See admin instructions. Shaklee Alfalfa tabs 2.85 g alfalfa powder, 300 mg calcium    . aspirin 81 MG tablet Take 81 mg by mouth daily.    . calcium carbonate (OS-CAL - DOSED IN MG OF ELEMENTAL CALCIUM) 1250 MG tablet Take 2 tablets by mouth 2 (two) times daily with a meal.     . cholecalciferol (VITAMIN D) 1000 UNITS tablet Take 1,000 Units by mouth 2 (two) times daily.    Marland Kitchen diltiazem (CARDIZEM CD) 180 MG 24 hr capsule Take 1 capsule (180 mg total) by mouth daily. 90 capsule 3  . Garlic 123XX123 MG CAPS Take 1,000 mg by mouth daily.    . Multiple Vitamin (MULTIVITAMIN) tablet Take 1 tablet by mouth daily.    . Omega-3 Fatty Acids (OMEGA 3 PO) Take 1 capsule by mouth daily.     Marland Kitchen omeprazole (PRILOSEC) 20 MG capsule Take 1 capsule (20 mg total) by mouth daily. 30 capsule 11  . tacrolimus (PROTOPIC) 0.1 % ointment Apply 1 application topically daily.     Marland Kitchen testosterone cypionate (DEPOTESTOSTERONE CYPIONATE) 200 MG/ML injection Inject into the muscle every 14 (fourteen) days.     No current facility-administered medications on file prior to visit.    Allergies  Allergen Reactions  . Penicillins     rash    Past Medical History  Diagnosis Date  . Abdominal pain   . Diverticular disease   . Diverticulitis   .  GERD (gastroesophageal reflux disease)   . Chest pain     negative stress echo in September 2013  . Atrial fibrillation [facility])     Past Surgical History  Procedure Laterality Date  . Partial colectomy  11/11  . Colon surgery      History  Smoking status  . Never Smoker   Smokeless tobacco  . Never Used    History  Alcohol Use No    Family History  Problem Relation Age of Onset  . Cancer Mother   . Alzheimer's disease Father   . Colon polyps Father   . GER disease Father   . Heart disease Paternal Grandmother   . Cancer Paternal Grandfather     Review of Systems: The review of systems is per the HPI.  All other systems were reviewed and are negative.  Physical Exam: BP 118/56 mmHg  Pulse 62  Ht 6' (1.829 m)  Wt 87.204 kg (192 lb 4 oz)  BMI 26.07 kg/m2 Patient is very pleasant and in no acute distress. Skin is warm and dry. Color is normal.  HEENT is unremarkable. Normocephalic/atraumatic. PERRL. Sclera are nonicteric. Neck is supple. No masses. No JVD. Lungs are clear. Cardiac exam shows a regular rate and rhythm. Abdomen is soft. Extremities are without edema. Gait  and ROM are intact. No gross neurologic deficits noted.  LABORATORY DATA:   Lab Results  Component Value Date   WBC 5.7 10/07/2015   HGB 16.8 10/07/2015   HCT 49.6 10/07/2015   PLT 161 10/07/2015   GLUCOSE 78 10/07/2015   CHOL 164 10/07/2015   TRIG 60 10/07/2015   HDL 45 10/07/2015   LDLCALC 107 10/07/2015   ALT 31 10/07/2015   AST 29 10/07/2015   NA 139 10/07/2015   K 4.1 10/07/2015   CL 100 10/07/2015   CREATININE 1.06 10/07/2015   BUN 20 10/07/2015   CO2 29 10/07/2015   TSH 2.86 10/07/2015   PSA 1.38 10/29/2015   INR 1.10 06/04/2013   Ecg today shows NSR with normal Ecg. I have personally reviewed and interpreted this study.    Assessment / Plan: 1. Paroxysmal atrial fibrillation. Patient is minimally symptomatic. He has a [place] and [place] vascular score of 0.  Continue baby aspirin  and diltiazem daily.    2.  Prior normal stress echo and Echo  Follow up in one year

## 2016-01-10 ENCOUNTER — Other Ambulatory Visit: Payer: Self-pay | Admitting: Cardiology

## 2016-01-10 NOTE — Telephone Encounter (Signed)
Rx request sent to pharmacy.  

## 2016-01-28 DIAGNOSIS — Z125 Encounter for screening for malignant neoplasm of prostate: Secondary | ICD-10-CM | POA: Diagnosis not present

## 2016-01-28 DIAGNOSIS — E291 Testicular hypofunction: Secondary | ICD-10-CM | POA: Diagnosis not present

## 2016-01-28 DIAGNOSIS — N5201 Erectile dysfunction due to arterial insufficiency: Secondary | ICD-10-CM | POA: Diagnosis not present

## 2016-02-07 DIAGNOSIS — N5201 Erectile dysfunction due to arterial insufficiency: Secondary | ICD-10-CM | POA: Diagnosis not present

## 2016-02-07 DIAGNOSIS — E291 Testicular hypofunction: Secondary | ICD-10-CM | POA: Diagnosis not present

## 2016-02-17 ENCOUNTER — Ambulatory Visit (INDEPENDENT_AMBULATORY_CARE_PROVIDER_SITE_OTHER): Payer: BLUE CROSS/BLUE SHIELD

## 2016-02-17 ENCOUNTER — Ambulatory Visit (INDEPENDENT_AMBULATORY_CARE_PROVIDER_SITE_OTHER): Payer: BLUE CROSS/BLUE SHIELD | Admitting: Family Medicine

## 2016-02-17 VITALS — BP 128/82 | HR 78 | Temp 99.1°F | Resp 17 | Ht 72.0 in | Wt 198.0 lb

## 2016-02-17 DIAGNOSIS — K5900 Constipation, unspecified: Secondary | ICD-10-CM

## 2016-02-17 DIAGNOSIS — R109 Unspecified abdominal pain: Secondary | ICD-10-CM | POA: Diagnosis not present

## 2016-02-17 DIAGNOSIS — R1030 Lower abdominal pain, unspecified: Secondary | ICD-10-CM

## 2016-02-17 LAB — POCT URINALYSIS DIP (MANUAL ENTRY)
Bilirubin, UA: NEGATIVE
Blood, UA: NEGATIVE
Glucose, UA: NEGATIVE
Ketones, POC UA: NEGATIVE
Leukocytes, UA: NEGATIVE
Nitrite, UA: NEGATIVE
Protein Ur, POC: NEGATIVE
Spec Grav, UA: 1.02
Urobilinogen, UA: 0.2
pH, UA: 7

## 2016-02-17 LAB — POCT CBC
Granulocyte percent: 68.8 %G (ref 37–80)
HCT, POC: 47.1 % (ref 43.5–53.7)
Hemoglobin: 16.8 g/dL (ref 14.1–18.1)
Lymph, poc: 1.8 (ref 0.6–3.4)
MCH, POC: 29.5 pg (ref 27–31.2)
MCHC: 35.6 g/dL — AB (ref 31.8–35.4)
MCV: 82.8 fL (ref 80–97)
MID (cbc): 0.6 (ref 0–0.9)
MPV: 6.4 fL (ref 0–99.8)
POC Granulocyte: 5.2 (ref 2–6.9)
POC LYMPH PERCENT: 23.7 %L (ref 10–50)
POC MID %: 7.5 %M (ref 0–12)
Platelet Count, POC: 179 10*3/uL (ref 142–424)
RBC: 5.69 M/uL (ref 4.69–6.13)
RDW, POC: 13.6 %
WBC: 7.6 10*3/uL (ref 4.6–10.2)

## 2016-02-17 LAB — POC MICROSCOPIC URINALYSIS (UMFC): Mucus: ABSENT

## 2016-02-17 IMAGING — DX DG ABDOMEN ACUTE W/ 1V CHEST
3 series · 3 of 3 positions shown · non-contrast
Comparison: [DATE]

CLINICAL DATA: Abdominal pain times 2 week with irregular bowel
patterns

EXAM:
DG ABDOMEN ACUTE W/ 1V CHEST

[chest pa]
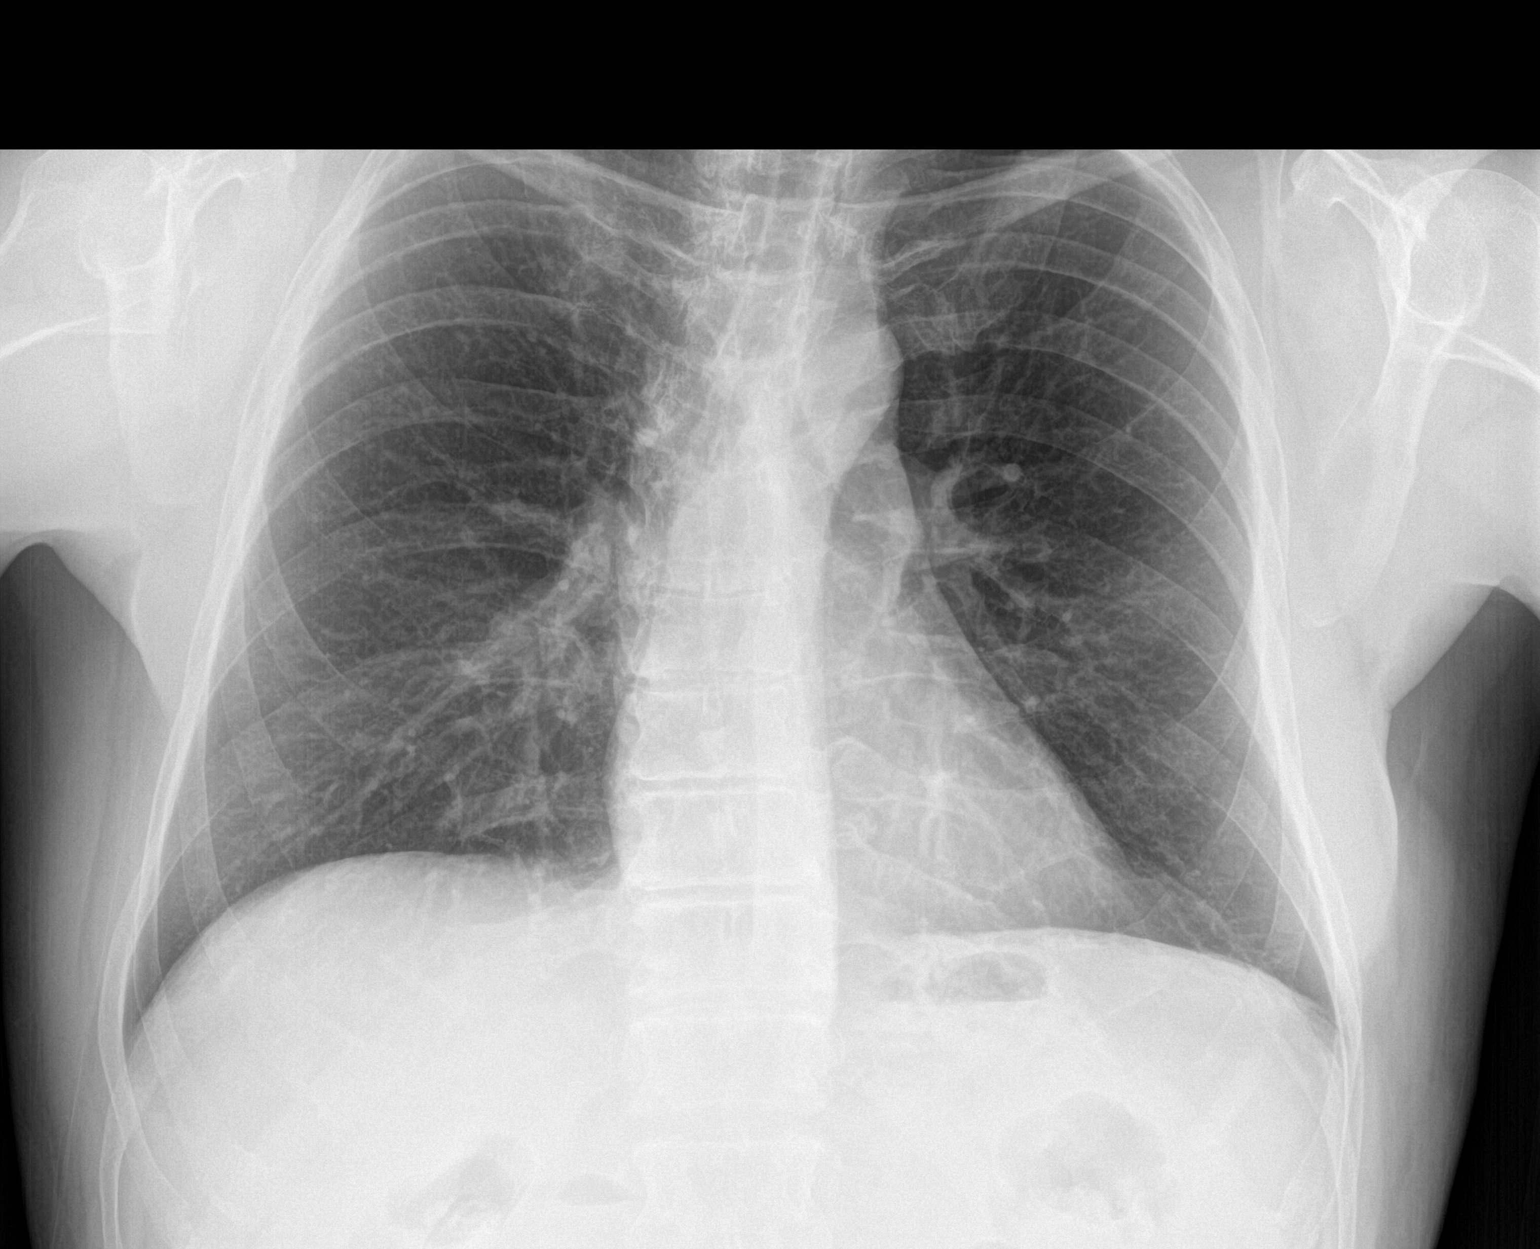

[abdomen erect]
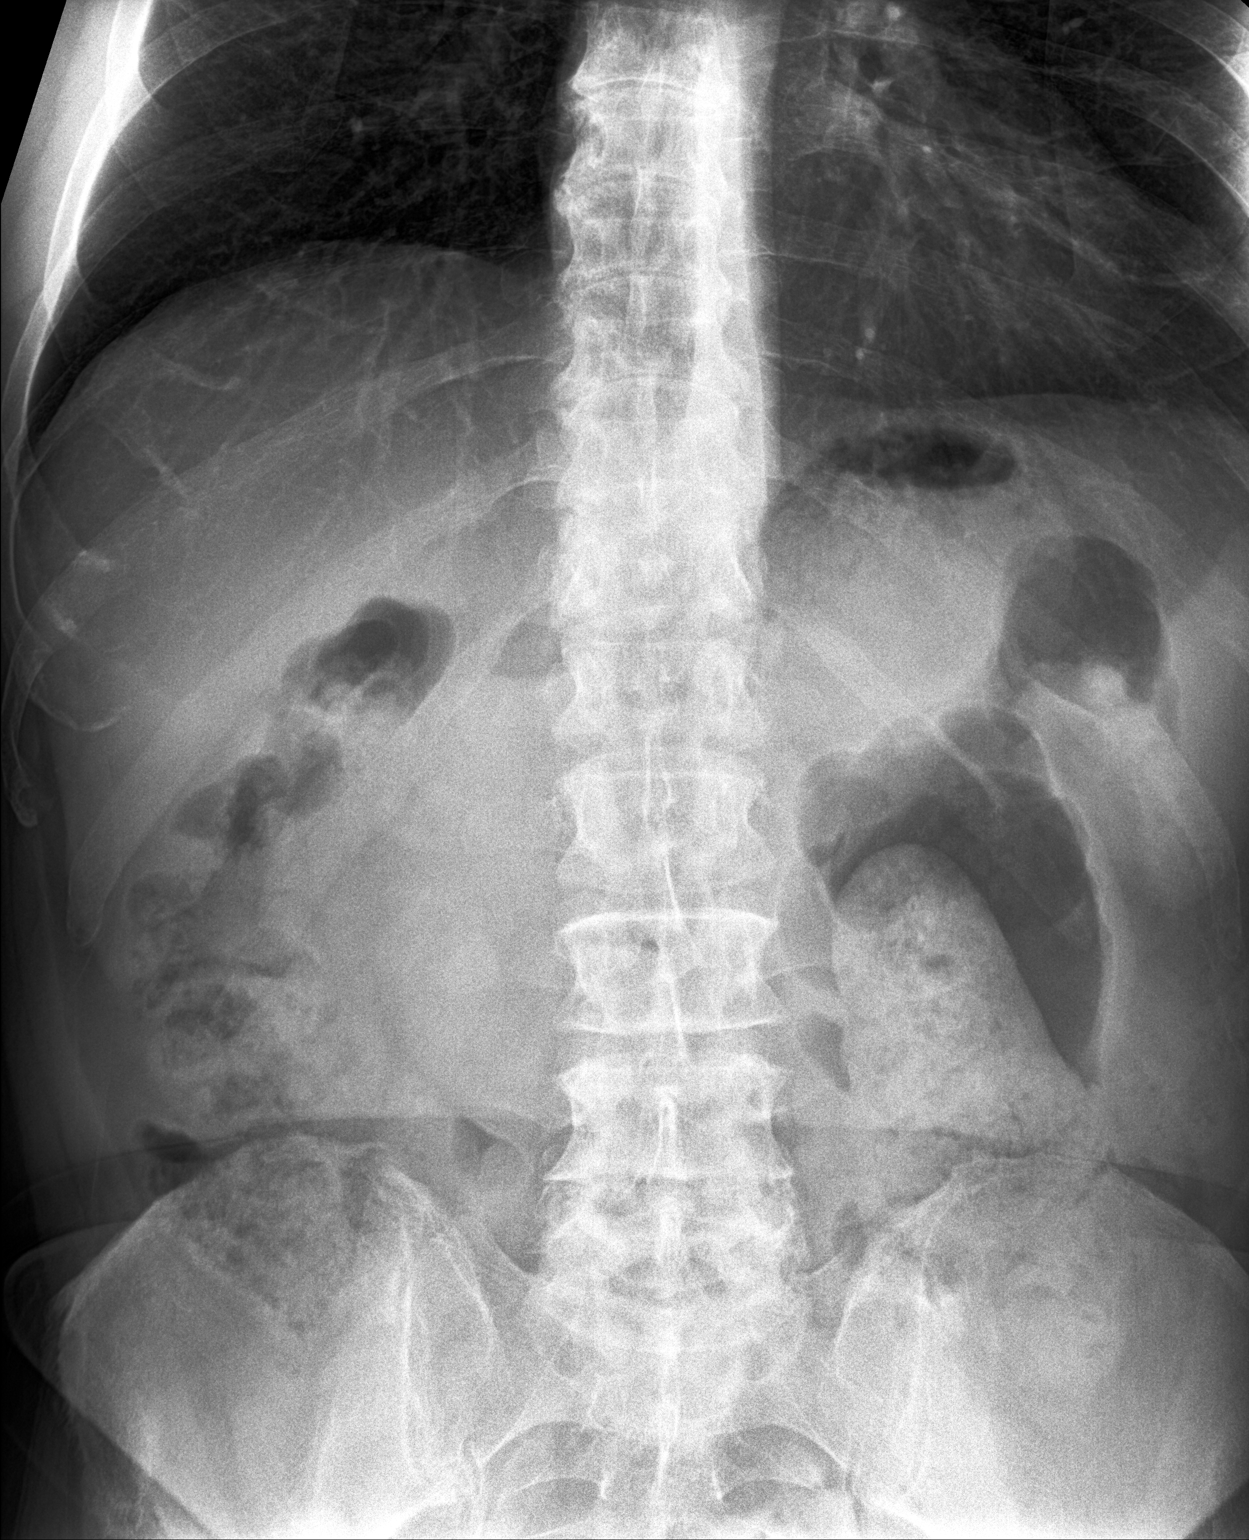

[abdomen supine]
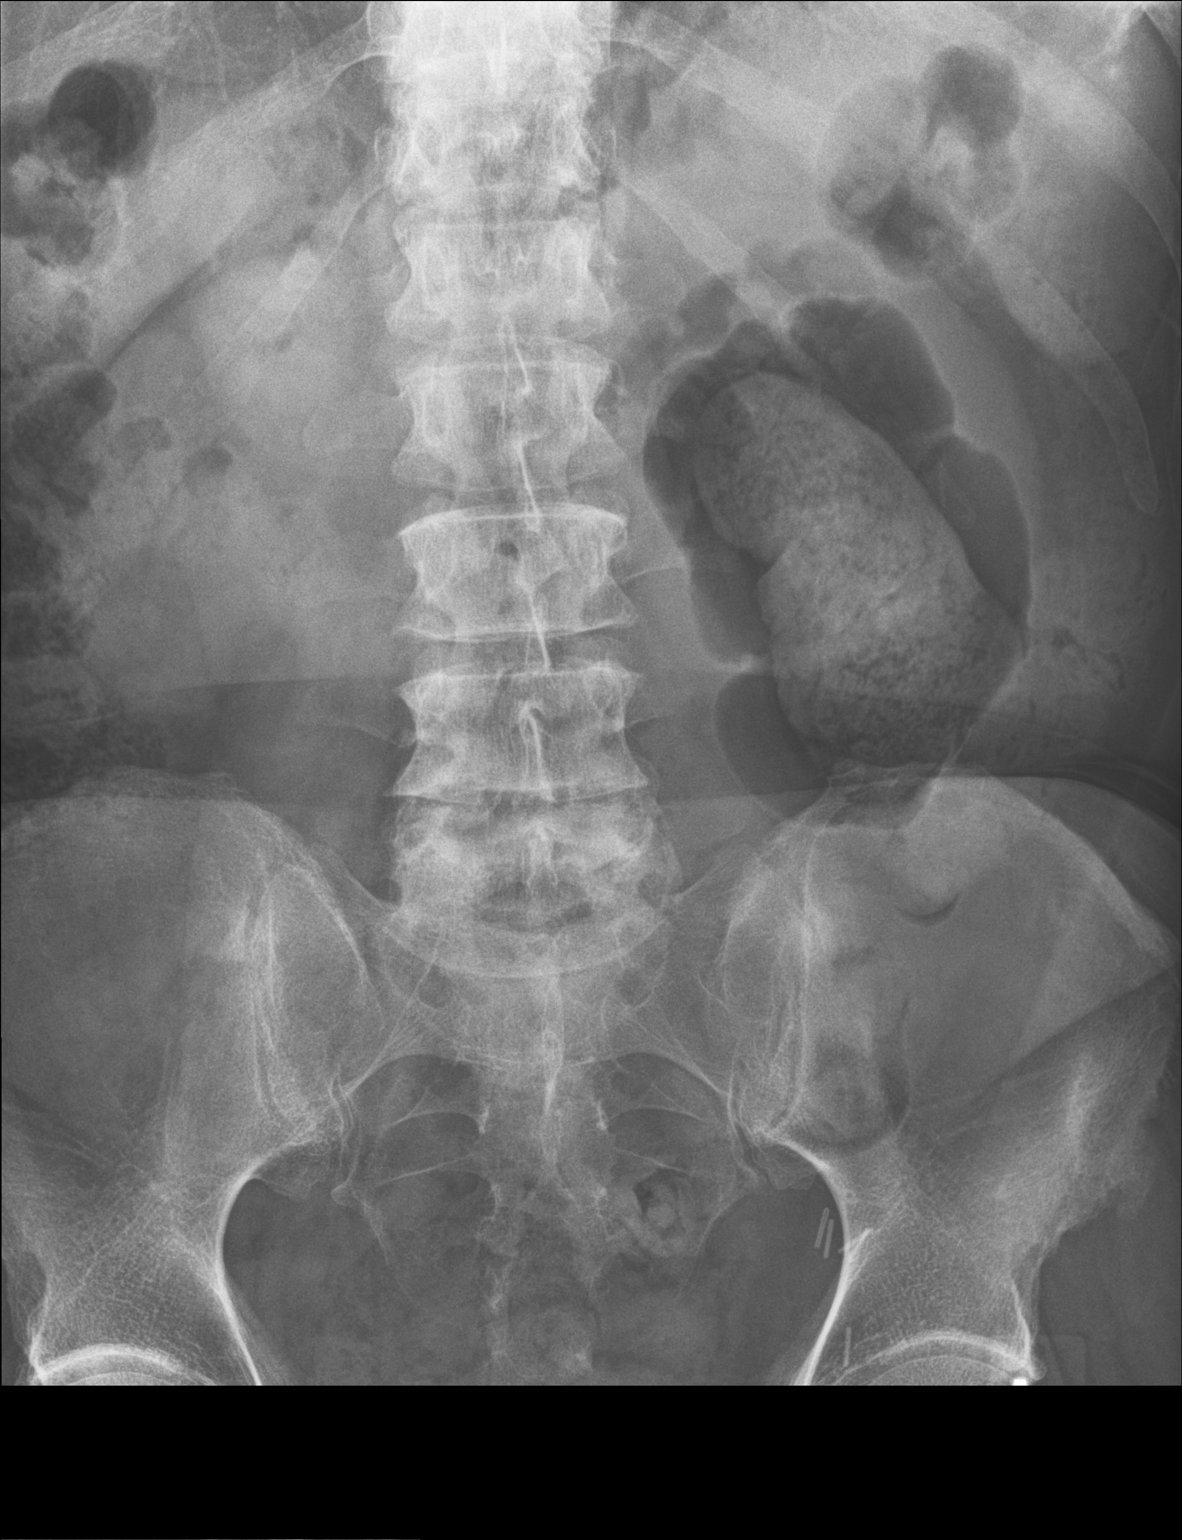

[3 of 3 positions shown; findings below may reference images not displayed]

FINDINGS: Heart size is normal.  Lungs are clear.

Supine and erect views of the abdomen show a nonobstructive bowel
gas pattern. There is a moderate amount of stool within nondilated
loops of colon. Degenerative changes are seen in the spine.
IMPRESSION: Moderate stool burden.

## 2016-02-17 MED ORDER — METRONIDAZOLE 500 MG PO TABS: 500.0000 mg | ORAL_TABLET | Freq: Two times a day (BID) | ORAL | Status: DC

## 2016-02-17 MED ORDER — CIPROFLOXACIN HCL 500 MG PO TABS: 500.0000 mg | ORAL_TABLET | Freq: Two times a day (BID) | ORAL | Status: DC

## 2016-02-17 NOTE — Patient Instructions (Addendum)
Start Myralax . Goal 4-5 bulky stools If abdominal pain persists by Monday, start antibiotic treatment for diverticulitis. If abdominal pain persists after antibiotics, contact the office.  Will consider CT of the abdomen at that time. IF you received an x-ray today, you will receive an invoice from [facility]. Please contact [facility] at [phone] with questions or concerns regarding your invoice.   IF you received labwork today, you will receive an invoice from [employer]. Please contact [facility] at [phone] with questions or concerns regarding your invoice.   Our billing staff will not be able to assist you with questions regarding bills from these companies.  You will be contacted with the lab results as soon as they are available. The fastest way to get your results is to activate your My Chart account. Instructions are located on the last page of this paperwork. If you have not heard from [place] regarding the results in 2 weeks, please contact this office.      LOW FIBER DIET  NO FRESH FRUITS AND VEGETABLES NO SALADS NO RED MEAT        Recommend   AVOID! Breads and Grains  White bread Bagels English Muffins Saltine crackers Rolls, buns, biscuits Melba toast, Zwieback Pancakes, Waffles   Whole grain bread Whole grain flower Graham flour Bran, granola, raisins Seeds, nuts, coconut, raw or dried fruits Graham crackers, cornbread  Cereals  Puffed rice, Cheerios Cornflakes, Rice Krispies Special K White rice, refined pasta  Grits, noodles, macaroni   Granola, oatmeal Any cereal with seeds, nuts, dried fruit Whole grain cereal Brown rice, wild rice, barley, bran  Vegetables  Cooked and canned with no seeds Vegetable juice Potatoes and carrots Mushrooms, squash   Raw fresh vegetables Vegetables with seeds, nuts, dried fruit Sauerkraut, winter squash, peas, broccoli, cauliflower, brussel sprouts, kale, swiss  chard, cabbage, fried potatoes   Fruits  Fruit juices, orange juice (no pulp) Most cooked or canned fruit, fruit cocktail, canned applesauce, canned peaches and pears   Prune juice Fresh or dried fruit All berries   Meat  Fish, chicken, pork Baked, broiled, boiled, roasted, microwaved    Red meat Fried meat   Dairy  Ice cream, pudding, custard Cream pies, plain cake, cookies Gelatin, smooth peanut butter, honey, jelly Milk shakes, yogurt, eggs 2% or skim milk, mild cheese Cottage cheese, mozzarella, provolone, jack   Jam with seeds, chunky peanut butter Sharp cheese

## 2016-02-17 NOTE — Progress Notes (Signed)
Patient ID: Nicholas West, male    DOB: 26-Dec-1955, 60 y.o.   MRN: [id number]  PCP: Jenny Reichmann, MD  Chief Complaint  Patient presents with  . Abdominal Pain    Subjective:   HPI Presents for evaluation of abdominal pain times two weeks.  Hx of diverticulitis of sigmoid colon resulting in a portion of the sigmoid being surgically removed. BM two per day, mixed consistency of soft or hard stools little over two weeks. Takes stool softer daily.No nausea, vomiting or fever. Some fatigue. No fever, chills, or bodyache. He denies urinary frequency or urgency.   Review of Systems  Constitutional: Positive for fatigue.  HENT: Negative.   Respiratory: Negative.   Cardiovascular: Negative.   Gastrointestinal: Positive for abdominal pain. Negative for vomiting, blood in stool, abdominal distention, anal bleeding and rectal pain.  Genitourinary: Negative for dysuria, urgency, frequency, flank pain and difficulty urinating.       Patient Active Problem List   Diagnosis Date Noted  . Paroxysmal atrial fibrillation ([place]) 10/24/2012  . Hypercholesterolemia 04/03/2012  . ED (erectile dysfunction) 03/05/2012  . Hypogonadism male 03/05/2012     Prior to Admission medications   Medication Sig Start Date End Date Taking? Authorizing Provider  ALFALFA PO Take by mouth See admin instructions. Shaklee Alfalfa tabs 2.85 g alfalfa powder, 300 mg calcium   Yes Historical Provider, MD  aspirin 81 MG tablet Take 81 mg by mouth daily.   Yes Historical Provider, MD  calcium carbonate (OS-CAL - DOSED IN MG OF ELEMENTAL CALCIUM) 1250 MG tablet Take 2 tablets by mouth 2 (two) times daily with a meal.    Yes Historical Provider, MD  CARTIA XT 180 MG 24 hr capsule TAKE ONE CAPSULE BY MOUTH ONCE DAILY 01/10/16  Yes [name], MD  cholecalciferol (VITAMIN D) 1000 UNITS tablet Take 1,000 Units by mouth 2 (two) times daily.   Yes Historical Provider, MD  Garlic 123XX123 MG CAPS Take 1,000 mg by mouth  daily.   Yes Historical Provider, MD  Multiple Vitamin (MULTIVITAMIN) tablet Take 1 tablet by mouth daily.   Yes Historical Provider, MD  Omega-3 Fatty Acids (OMEGA 3 PO) Take 2 capsules by mouth daily.    Yes Historical Provider, MD  omeprazole (PRILOSEC) 20 MG capsule Take 1 capsule (20 mg total) by mouth daily. 03/05/12  Yes [name], MD  sildenafil (REVATIO) 20 MG tablet Take by mouth. Take 1 to 5 tabs by mouth as needed for erectile dysfunction   Yes Historical Provider, MD  tacrolimus (PROTOPIC) 0.1 % ointment Apply 1 application topically daily.    Yes Historical Provider, MD  testosterone cypionate (DEPOTESTOSTERONE CYPIONATE) 200 MG/ML injection Inject into the muscle once a week.    Yes Historical Provider, MD     Allergies  Allergen Reactions  . Penicillins     rash       Objective:  Physical Exam  Constitutional: He is oriented to person, place, and time. He appears well-developed and well-nourished.  HENT:  Head: Normocephalic and atraumatic.  Eyes: Conjunctivae and EOM are normal.  Neck: Normal range of motion. Neck supple.  Cardiovascular: Normal rate, regular rhythm, normal heart sounds and intact distal pulses.   Pulmonary/Chest: Effort normal and breath sounds normal.  Abdominal: He exhibits no mass. There is tenderness. There is no rebound and no guarding.  Musculoskeletal: Normal range of motion.  Neurological: He is alert and oriented to person, place, and time. He has normal reflexes.  Skin: Skin is warm and dry.  Psychiatric: He has a normal mood and affect. His behavior is normal. Judgment and thought content normal.     Dg Abd Acute W/chest  02/17/2016  CLINICAL DATA:  Abdominal pain times 2 week with irregular bowel patterns EXAM: DG ABDOMEN ACUTE W/ 1V CHEST COMPARISON:  08/02/2015 FINDINGS: Heart size is normal.  Lungs are clear. Supine and erect views of the abdomen show a nonobstructive bowel gas pattern. There is a moderate amount of stool within  nondilated loops of colon. Degenerative changes are seen in the spine. IMPRESSION: Moderate stool burden. Electronically Signed   By: Nolon Nations M.D.   On: 02/17/2016 16:14        Assessment & Plan:  Patient presents with a two week history of localized lower abdominal in which he rates as 2/10.  With patients past history of diverticulitis, it is appropriate to consider a CT scan at some point if symptoms persist.   DG Acute abdomen with chest-revealed moderate stool in loops of colon. POCT CBC with Differential-unremarkable POCT Urinalysis dipstick-unremarkable  Miralax for constipation. Goal 4-5 bulky stools. Increase water consumption 6-8 , 8oz glasses per day.  Printed Antibiotic prescriptions in the event abdominal pain continues after laxative. Take  Ciprofloxacin 500 mg tablet, BID 10 days and Metronidazole 500 mg, BID, 7 days. For empiric coverage for diverticulitis.  CT scan if abdominal pain persists. Follow-up as needed.  Carroll Sage. Kenton Kingfisher, MSN, FNP-C [facility]

## 2016-02-19 ENCOUNTER — Encounter: Payer: Self-pay | Admitting: Family Medicine

## 2016-05-02 DIAGNOSIS — K5900 Constipation, unspecified: Secondary | ICD-10-CM | POA: Diagnosis not present

## 2016-05-24 ENCOUNTER — Telehealth: Payer: Self-pay

## 2016-05-24 ENCOUNTER — Telehealth: Payer: Self-pay | Admitting: Cardiology

## 2016-05-24 NOTE — Telephone Encounter (Signed)
Pharmacy changed to [facility] at [place].

## 2016-05-24 NOTE — Telephone Encounter (Signed)
Pt wants you to know he changed pharmacy. His new pharmacy is [facility]-[phone].

## 2016-05-24 NOTE — Telephone Encounter (Signed)
Pt is needing to change his pharmacy on file to  [facility] on [address]

## 2016-05-26 DIAGNOSIS — Z23 Encounter for immunization: Secondary | ICD-10-CM | POA: Diagnosis not present

## 2016-10-11 ENCOUNTER — Encounter: Payer: Self-pay | Admitting: Emergency Medicine

## 2016-10-11 ENCOUNTER — Ambulatory Visit (INDEPENDENT_AMBULATORY_CARE_PROVIDER_SITE_OTHER): Payer: BLUE CROSS/BLUE SHIELD | Admitting: Emergency Medicine

## 2016-10-11 VITALS — BP 110/76 | HR 79 | Temp 99.4°F | Resp 18 | Ht 72.0 in | Wt 189.0 lb

## 2016-10-11 DIAGNOSIS — S61412A Laceration without foreign body of left hand, initial encounter: Secondary | ICD-10-CM | POA: Diagnosis not present

## 2016-10-11 DIAGNOSIS — S61419A Laceration without foreign body of unspecified hand, initial encounter: Secondary | ICD-10-CM

## 2016-10-11 DIAGNOSIS — S5001XA Contusion of right elbow, initial encounter: Secondary | ICD-10-CM

## 2016-10-11 HISTORY — DX: Laceration without foreign body of unspecified hand, initial encounter: S61.419A

## 2016-10-11 HISTORY — DX: Contusion of right elbow, initial encounter: S50.01XA

## 2016-10-11 NOTE — Progress Notes (Signed)
Nicholas West 61 y.o.   Chief Complaint  Patient presents with  . Fall    patient fell ont he ice has cut on left hand and elbow pain    HISTORY OF PRESENT ILLNESS: This is a 61 y.o. male sustained left hand laceration and right elbow contusion yesterday am after a fall; slipped on ice; denies head injury or syncope. No other significant injuries or symptoms.  HPI   Prior to Admission medications   Medication Sig Start Date End Date Taking? Authorizing Provider  ALFALFA PO Take by mouth See admin instructions. Shaklee Alfalfa tabs 2.85 g alfalfa powder, 300 mg calcium   Yes Historical Provider, MD  aspirin 81 MG tablet Take 81 mg by mouth daily.   Yes Historical Provider, MD  calcium carbonate (OS-CAL - DOSED IN MG OF ELEMENTAL CALCIUM) 1250 MG tablet Take 2 tablets by mouth 2 (two) times daily with a meal.    Yes Historical Provider, MD  CARTIA XT 180 MG 24 hr capsule TAKE ONE CAPSULE BY MOUTH ONCE DAILY 01/10/16  Yes [name], MD  cholecalciferol (VITAMIN D) 1000 UNITS tablet Take 1,000 Units by mouth 2 (two) times daily.   Yes Historical Provider, MD  Garlic 7106 MG CAPS Take 1,000 mg by mouth daily.   Yes Historical Provider, MD  Multiple Vitamin (MULTIVITAMIN) tablet Take 1 tablet by mouth daily.   Yes Historical Provider, MD  Omega-3 Fatty Acids (OMEGA 3 PO) Take 2 capsules by mouth daily.    Yes Historical Provider, MD  omeprazole (PRILOSEC) 20 MG capsule Take 1 capsule (20 mg total) by mouth daily. 03/05/12  Yes [name], MD  sildenafil (REVATIO) 20 MG tablet Take by mouth. Take 1 to 5 tabs by mouth as needed for erectile dysfunction   Yes Historical Provider, MD  tacrolimus (PROTOPIC) 0.1 % ointment Apply 1 application topically daily.    Yes Historical Provider, MD  testosterone cypionate (DEPOTESTOSTERONE CYPIONATE) 200 MG/ML injection Inject into the muscle once a week.    Yes Historical Provider, MD    Allergies  Allergen Reactions  . Penicillins     rash     Patient Active Problem List   Diagnosis Date Noted  . Laceration of left hand without foreign body 10/11/2016  . Contusion of right elbow 10/11/2016  . Paroxysmal atrial fibrillation ([place]) 10/24/2012  . Hypercholesterolemia 04/03/2012  . ED (erectile dysfunction) 03/05/2012  . Hypogonadism male 03/05/2012    Past Medical History:  Diagnosis Date  . Abdominal pain   . Atrial fibrillation ([place])   . Chest pain    negative stress echo in September 2013  . Diverticular disease   . Diverticulitis   . GERD (gastroesophageal reflux disease)     Past Surgical History:  Procedure Laterality Date  . COLON SURGERY    . PARTIAL COLECTOMY  11/11    Social History   Social History  . Marital status: Married    Spouse name: N/A  . Number of children: 0  . Years of education: N/A   Occupational History  . attorney [employer]   Social History Main Topics  . Smoking status: Never Smoker  . Smokeless tobacco: Never Used  . Alcohol use No  . Drug use: No  . Sexual activity: Yes   Other Topics Concern  . Not on file   Social History Narrative   Married   Exercise: Yes   Education: College    Family History  Problem Relation  Age of Onset  . Cancer Mother   . Alzheimer's disease Father   . Colon polyps Father   . GER disease Father   . Heart disease Paternal Grandmother   . Cancer Paternal Grandfather      Review of Systems  Constitutional: Negative for diaphoresis and fever.  HENT: Negative for nosebleeds and sore throat.   Eyes: Negative for blurred vision and double vision.  Respiratory: Negative for shortness of breath.   Cardiovascular: Negative for chest pain.  Gastrointestinal: Negative for nausea and vomiting.  Musculoskeletal: Negative for back pain and myalgias.  Skin:       +laceration  Neurological: Negative for dizziness, sensory change, focal weakness and headaches.  All other systems reviewed and are negative.   Vitals:   10/11/16  1524  BP: 110/76  Pulse: 79  Resp: 18  Temp: 99.4 F (37.4 C)    Physical Exam  Constitutional: He is oriented to person, place, and time. He appears well-developed and well-nourished.  HENT:  Head: Normocephalic and atraumatic.  Eyes: Conjunctivae and EOM are normal. Pupils are equal, round, and reactive to light.  Neck: Normal range of motion. Neck supple.  Cardiovascular: Normal rate and regular rhythm.   Pulmonary/Chest: Effort normal and breath sounds normal.  Abdominal: Soft. There is no tenderness.  Musculoskeletal:  Right elbow: FROM; no erythema or bruising, mild swelling; above and below joints WNL. All other extremities: WNL.  Neurological: He is alert and oriented to person, place, and time. No sensory deficit. He exhibits normal muscle tone.  Skin: Skin is warm and dry. Capillary refill takes less than 2 seconds.  Small closed laceration with mild hematoma to left inner proximal hand palmar aspect  Vitals reviewed.    ASSESSMENT & PLAN: Nicholas West was seen today for fall.  Diagnoses and all orders for this visit:  Laceration of left hand without foreign body, initial encounter  Contusion of right elbow, initial encounter    Patient Instructions       IF you received an x-ray today, you will receive an invoice from [facility]. Please contact [facility] at [phone] with questions or concerns regarding your invoice.   IF you received labwork today, you will receive an invoice from [place]. Please contact [facility] at [phone] with questions or concerns regarding your invoice.   Our billing staff will not be able to assist you with questions regarding bills from these companies.  You will be contacted with the lab results as soon as they are available. The fastest way to get your results is to activate your My Chart account. Instructions are located on the last page of this paperwork. If you have not heard from [place] regarding the results  in 2 weeks, please contact this office.     Laceration Care, Adult A laceration is a cut that goes through all layers of the skin. The cut also goes into the tissue that is right under the skin. Some cuts heal on their own. Others need to be closed with stitches (sutures), staples, skin adhesive strips, or wound glue. Taking care of your cut lowers your risk of infection and helps your cut to heal better. How to take care of your cut For stitches or staples:   Keep the wound clean and dry.  If you were given a bandage (dressing), you should change it at least one time per day or as told by your doctor. You should also change it if it gets wet or dirty.  Keep  the wound completely dry for the first 24 hours or as told by your doctor. After that time, you may take a shower or a bath. However, make sure that the wound is not soaked in water until after the stitches or staples have been removed.  Clean the wound one time each day or as told by your doctor:  Wash the wound with soap and water.  Rinse the wound with water until all of the soap comes off.  Pat the wound dry with a clean towel. Do not rub the wound.  After you clean the wound, put a thin layer of antibiotic ointment on it as told by your doctor. This ointment:  Helps to prevent infection.  Keeps the bandage from sticking to the wound.  Have your stitches or staples removed as told by your doctor. If your doctor used skin adhesive strips:   Keep the wound clean and dry.  If you were given a bandage, you should change it at least one time per day or as told by your doctor. You should also change it if it gets dirty or wet.  Do not get the skin adhesive strips wet. You can take a shower or a bath, but be careful to keep the wound dry.  If the wound gets wet, pat it dry with a clean towel. Do not rub the wound.  Skin adhesive strips fall off on their own. You can trim the strips as the wound heals. Do not remove any strips  that are still stuck to the wound. They will fall off after a while. If your doctor used wound glue:   Try to keep your wound dry, but you may briefly wet it in the shower or bath. Do not soak the wound in water, such as by swimming.  After you take a shower or a bath, gently pat the wound dry with a clean towel. Do not rub the wound.  Do not do any activities that will make you really sweaty until the skin glue has fallen off on its own.  Do not apply liquid, cream, or ointment medicine to your wound while the skin glue is still on.  If you were given a bandage, you should change it at least one time per day or as told by your doctor. You should also change it if it gets dirty or wet.  If a bandage is placed over the wound, do not let the tape for the bandage touch the skin glue.  Do not pick at the glue. The skin glue usually stays on for 5-10 days. Then, it falls off of the skin. General Instructions   To help prevent scarring, make sure to cover your wound with sunscreen whenever you are outside after stitches are removed, after adhesive strips are removed, or when wound glue stays in place and the wound is healed. Make sure to wear a sunscreen of at least 30 SPF.  Take over-the-counter and prescription medicines only as told by your doctor.  If you were given antibiotic medicine or ointment, take or apply it as told by your doctor. Do not stop using the antibiotic even if your wound is getting better.  Do not scratch or pick at the wound.  Keep all follow-up visits as told by your doctor. This is important.  Check your wound every day for signs of infection. Watch for:  Redness, swelling, or pain.  Fluid, blood, or pus.  Raise (elevate) the injured area above the level of  your heart while you are sitting or lying down, if possible. Get help if:  You got a tetanus shot and you have any of these problems at the injection site:  Swelling.  Very bad  pain.  Redness.  Bleeding.  You have a fever.  A wound that was closed breaks open.  You notice a bad smell coming from your wound or your bandage.  You notice something coming out of the wound, such as wood or glass.  Medicine does not help your pain.  You have more redness, swelling, or pain at the site of your wound.  You have fluid, blood, or pus coming from your wound.  You notice a change in the color of your skin near your wound.  You need to change the bandage often because fluid, blood, or pus is coming from the wound.  You start to have a new rash.  You start to have numbness around the wound. Get help right away if:  You have very bad swelling around the wound.  Your pain suddenly gets worse and is very bad.  You notice painful lumps near the wound or on skin that is anywhere on your body.  You have a red streak going away from your wound.  The wound is on your hand or foot and you cannot move a finger or toe like you usually can.  The wound is on your hand or foot and you notice that your fingers or toes look pale or bluish. This information is not intended to replace advice given to you by your health care provider. Make sure you discuss any questions you have with your health care provider. Document Released: 01/03/2008 Document Revised: 12/23/2015 Document Reviewed: 07/13/2014 Elsevier Interactive Patient Education  2017 Elsevier Inc.      Agustina Caroli, MD [facility]

## 2016-10-11 NOTE — Patient Instructions (Addendum)
IF you received an x-ray today, you will receive an invoice from [facility]. Please contact [facility] at [phone] with questions or concerns regarding your invoice.   IF you received labwork today, you will receive an invoice from [place]. Please contact [facility] at [phone] with questions or concerns regarding your invoice.   Our billing staff will not be able to assist you with questions regarding bills from these companies.  You will be contacted with the lab results as soon as they are available. The fastest way to get your results is to activate your My Chart account. Instructions are located on the last page of this paperwork. If you have not heard from [place] regarding the results in 2 weeks, please contact this office.     Laceration Care, Adult A laceration is a cut that goes through all layers of the skin. The cut also goes into the tissue that is right under the skin. Some cuts heal on their own. Others need to be closed with stitches (sutures), staples, skin adhesive strips, or wound glue. Taking care of your cut lowers your risk of infection and helps your cut to heal better. How to take care of your cut For stitches or staples:   Keep the wound clean and dry.  If you were given a bandage (dressing), you should change it at least one time per day or as told by your doctor. You should also change it if it gets wet or dirty.  Keep the wound completely dry for the first 24 hours or as told by your doctor. After that time, you may take a shower or a bath. However, make sure that the wound is not soaked in water until after the stitches or staples have been removed.  Clean the wound one time each day or as told by your doctor:  Wash the wound with soap and water.  Rinse the wound with water until all of the soap comes off.  Pat the wound dry with a clean towel. Do not rub the wound.  After you clean the wound, put a thin layer of antibiotic  ointment on it as told by your doctor. This ointment:  Helps to prevent infection.  Keeps the bandage from sticking to the wound.  Have your stitches or staples removed as told by your doctor. If your doctor used skin adhesive strips:   Keep the wound clean and dry.  If you were given a bandage, you should change it at least one time per day or as told by your doctor. You should also change it if it gets dirty or wet.  Do not get the skin adhesive strips wet. You can take a shower or a bath, but be careful to keep the wound dry.  If the wound gets wet, pat it dry with a clean towel. Do not rub the wound.  Skin adhesive strips fall off on their own. You can trim the strips as the wound heals. Do not remove any strips that are still stuck to the wound. They will fall off after a while. If your doctor used wound glue:   Try to keep your wound dry, but you may briefly wet it in the shower or bath. Do not soak the wound in water, such as by swimming.  After you take a shower or a bath, gently pat the wound dry with a clean towel. Do not rub the wound.  Do not do any activities that will make you  really sweaty until the skin glue has fallen off on its own.  Do not apply liquid, cream, or ointment medicine to your wound while the skin glue is still on.  If you were given a bandage, you should change it at least one time per day or as told by your doctor. You should also change it if it gets dirty or wet.  If a bandage is placed over the wound, do not let the tape for the bandage touch the skin glue.  Do not pick at the glue. The skin glue usually stays on for 5-10 days. Then, it falls off of the skin. General Instructions   To help prevent scarring, make sure to cover your wound with sunscreen whenever you are outside after stitches are removed, after adhesive strips are removed, or when wound glue stays in place and the wound is healed. Make sure to wear a sunscreen of at least 30  SPF.  Take over-the-counter and prescription medicines only as told by your doctor.  If you were given antibiotic medicine or ointment, take or apply it as told by your doctor. Do not stop using the antibiotic even if your wound is getting better.  Do not scratch or pick at the wound.  Keep all follow-up visits as told by your doctor. This is important.  Check your wound every day for signs of infection. Watch for:  Redness, swelling, or pain.  Fluid, blood, or pus.  Raise (elevate) the injured area above the level of your heart while you are sitting or lying down, if possible. Get help if:  You got a tetanus shot and you have any of these problems at the injection site:  Swelling.  Very bad pain.  Redness.  Bleeding.  You have a fever.  A wound that was closed breaks open.  You notice a bad smell coming from your wound or your bandage.  You notice something coming out of the wound, such as wood or glass.  Medicine does not help your pain.  You have more redness, swelling, or pain at the site of your wound.  You have fluid, blood, or pus coming from your wound.  You notice a change in the color of your skin near your wound.  You need to change the bandage often because fluid, blood, or pus is coming from the wound.  You start to have a new rash.  You start to have numbness around the wound. Get help right away if:  You have very bad swelling around the wound.  Your pain suddenly gets worse and is very bad.  You notice painful lumps near the wound or on skin that is anywhere on your body.  You have a red streak going away from your wound.  The wound is on your hand or foot and you cannot move a finger or toe like you usually can.  The wound is on your hand or foot and you notice that your fingers or toes look pale or bluish. This information is not intended to replace advice given to you by your health care provider. Make sure you discuss any questions  you have with your health care provider. Document Released: 01/03/2008 Document Revised: 12/23/2015 Document Reviewed: 07/13/2014 Elsevier Interactive Patient Education  2017 [employer].

## 2016-10-26 ENCOUNTER — Ambulatory Visit (INDEPENDENT_AMBULATORY_CARE_PROVIDER_SITE_OTHER): Payer: BLUE CROSS/BLUE SHIELD | Admitting: Family Medicine

## 2016-10-26 ENCOUNTER — Encounter: Payer: Self-pay | Admitting: Family Medicine

## 2016-10-26 VITALS — BP 125/76 | HR 63 | Temp 98.4°F | Ht 72.0 in | Wt 188.6 lb

## 2016-10-26 DIAGNOSIS — Z13220 Encounter for screening for lipoid disorders: Secondary | ICD-10-CM

## 2016-10-26 DIAGNOSIS — G933 Postviral fatigue syndrome: Secondary | ICD-10-CM | POA: Diagnosis not present

## 2016-10-26 DIAGNOSIS — Z131 Encounter for screening for diabetes mellitus: Secondary | ICD-10-CM | POA: Diagnosis not present

## 2016-10-26 DIAGNOSIS — Z5181 Encounter for therapeutic drug level monitoring: Secondary | ICD-10-CM

## 2016-10-26 DIAGNOSIS — K219 Gastro-esophageal reflux disease without esophagitis: Secondary | ICD-10-CM | POA: Diagnosis not present

## 2016-10-26 DIAGNOSIS — H6122 Impacted cerumen, left ear: Secondary | ICD-10-CM

## 2016-10-26 DIAGNOSIS — H9192 Unspecified hearing loss, left ear: Secondary | ICD-10-CM

## 2016-10-26 DIAGNOSIS — G9331 Postviral fatigue syndrome: Secondary | ICD-10-CM

## 2016-10-26 DIAGNOSIS — Z0000 Encounter for general adult medical examination without abnormal findings: Secondary | ICD-10-CM | POA: Diagnosis not present

## 2016-10-26 DIAGNOSIS — I480 Paroxysmal atrial fibrillation: Secondary | ICD-10-CM | POA: Diagnosis not present

## 2016-10-26 DIAGNOSIS — Z114 Encounter for screening for human immunodeficiency virus [HIV]: Secondary | ICD-10-CM

## 2016-10-26 NOTE — Patient Instructions (Addendum)
For heartburn, try switching to zantac or spacing out dosing of omeprazole.   If finger pains worsen or other joint swelling, return to discuss further.   Follow up as planned with cardiologist, and urology.  There was significant cerumen or earwax on the left side, with moderate cerumen on the right. If hearing loss persist after procedure today, still follow-up with Dr. Benjamine Mola as planned.  You can also try over-the-counter Debrox, or follow-up with me in the next week or 2 if that ear still feels full as there is some residual cerumen.  Fatigue should improve in next week or two after recent viral infection. Your exam was reassuring today, but I will check your blood count, thyroid, and electrolyte tests. Return to the clinic or go to the nearest emergency room if any of your symptoms worsen or new symptoms occur.   Earwax Buildup Your ears make a substance called earwax. It may also be called cerumen. Sometimes, too much earwax builds up in your ear canal. This can cause ear pain and make it harder for you to hear. CAUSES This condition is caused by too much earwax production or buildup. RISK FACTORS The following factors may make you more likely to develop this condition:  Cleaning your ears often with swabs.  Having narrow ear canals.  Having earwax that is overly thick or sticky.  Having eczema.  Being dehydrated. SYMPTOMS Symptoms of this condition include:  Reduced hearing.  Ear drainage.  Ear pain.  Ear itch.  A feeling of fullness in the ear or feeling that the ear is plugged.  Ringing in the ear.  Coughing. DIAGNOSIS Your health care provider can diagnose this condition based on your symptoms and medical history. Your health care provider will also do an ear exam to look inside your ear with a scope (otoscope). You may also have a hearing test. TREATMENT Treatment for this condition includes:  Over-the-counter or prescription ear drops to soften the  earwax.  Earwax removal by a health care provider. This may be done:  By flushing the ear with body-temperature water.  With a medical instrument that has a loop at the end (earwax curette).  With a suction device. HOME CARE INSTRUCTIONS  Take over-the-counter and prescription medicines only as told by your health care provider.  Do not put any objects, including an ear swab, into your ear. You can clean the opening of your ear canal with a washcloth.  Drink enough water to keep your urine clear or pale yellow.  If you have frequent earwax buildup or you use hearing aids, consider seeing your health care provider every 6-12 months for routine preventive ear cleanings. Keep all follow-up visits as told by your health care provider. SEEK MEDICAL CARE IF:  You have ear pain.  Your condition does not improve with treatment.  You have hearing loss.  You have blood, pus, or other fluid coming from your ear. This information is not intended to replace advice given to you by your health care provider. Make sure you discuss any questions you have with your health care provider. Document Released: 08/24/2004 Document Revised: 11/08/2015 Document Reviewed: 03/03/2015 Elsevier Interactive Patient Education  2017 [place] you healthy  Get these tests  Blood pressure- Have your blood pressure checked once a year by your healthcare provider.  Normal blood pressure is 120/80  Weight- Have your body mass index (BMI) calculated to screen for obesity.  BMI is a measure of body fat based  on height and weight. You can also calculate your own BMI at ViewBanking.si.  Cholesterol- Have your cholesterol checked every year.  Diabetes- Have your blood sugar checked regularly if you have high blood pressure, high cholesterol, have a family history of diabetes or if you are overweight.  Screening for Colon Cancer- Colonoscopy starting at age 45.  Screening may begin sooner  depending on your family history and other health conditions. Follow up colonoscopy as directed by your Gastroenterologist.  Screening for Prostate Cancer- Both blood work (PSA) and a rectal exam help screen for Prostate Cancer.  Screening begins at age 67 with African-American men and at age 43 with Caucasian men.  Screening may begin sooner depending on your family history.  Take these medicines  Aspirin- One aspirin daily can help prevent Heart disease and Stroke.  Flu shot- Every fall.  Tetanus- Every 10 years.  Zostavax- Once after the age of 18 to prevent Shingles.  Pneumonia shot- Once after the age of 7; if you are younger than 63, ask your healthcare provider if you need a Pneumonia shot.  Take these steps  Don't smoke- If you do smoke, talk to your doctor about quitting.  For tips on how to quit, go to www.smokefree.gov or call 1-800-QUIT-NOW.  Be physically active- Exercise 5 days a week for at least 30 minutes.  If you are not already physically active start slow and gradually work up to 30 minutes of moderate physical activity.  Examples of moderate activity include walking briskly, mowing the yard, dancing, swimming, bicycling, etc.  Eat a healthy diet- Eat a variety of healthy food such as fruits, vegetables, low fat milk, low fat cheese, yogurt, lean meant, poultry, fish, beans, tofu, etc. For more information go to www.thenutritionsource.org  Drink alcohol in moderation- Limit alcohol intake to less than two drinks a day. Never drink and drive.  Dentist- Brush and floss twice daily; visit your dentist twice a year.  Depression- Your emotional health is as important as your physical health. If you're feeling down, or losing interest in things you would normally enjoy please talk to your healthcare provider.  Eye exam- Visit your eye doctor every year.  Safe sex- If you may be exposed to a sexually transmitted infection, use a condom.  Seat belts- Seat belts can save  your life; always wear one.  Smoke/Carbon Monoxide detectors- These detectors need to be installed on the appropriate level of your home.  Replace batteries at least once a year.  Skin cancer- When out in the sun, cover up and use sunscreen 15 SPF or higher.  Violence- If anyone is threatening you, please tell your healthcare provider.  Living Will/ Health care power of attorney- Speak with your healthcare provider and family.   IF you received an x-ray today, you will receive an invoice from [facility]. Please contact [facility] at [phone] with questions or concerns regarding your invoice.   IF you received labwork today, you will receive an invoice from [place]. Please contact [facility] at [phone] with questions or concerns regarding your invoice.   Our billing staff will not be able to assist you with questions regarding bills from these companies.  You will be contacted with the lab results as soon as they are available. The fastest way to get your results is to activate your My Chart account. Instructions are located on the last page of this paperwork. If you have not heard from [place] regarding the results in 2 weeks, please contact  this office.

## 2016-10-26 NOTE — Progress Notes (Signed)
By signing my name below, I, Mesha Guinyard, attest that this documentation has been prepared under the direction and in the presence of Merri Ray, MD.  Electronically Signed: Verlee Monte, Medical Scribe. 10/26/16. 8:55 AM.  Subjective:    Patient ID: Nicholas West, male    DOB: [birth year], 61 y.o.   MRN: [id number]  HPI Chief Complaint  Patient presents with  . Annual Exam    CPE    HPI Comments: Nicholas West is a 61 y.o. male who presents to the Primary [facility] at [facility] for his complete physical. He is a new pt to me. Previously followed by Dr. Everlene Farrier. He has a hx of HLD, paroxysmal A. Fib, GERD, erectile dysfunction, and hypogonadism/low testosterone. Last physical with Dr. Everlene Farrier March 2017.  ROS Complaints: Hearing Loss: Noticeably worsening. Saw Dr. Benjamine Mola with HENT in the past. Pt wasn't recommended hearing-aids when he was seen. Joint Swelling: Located in his right pointer finger. Fatigue: Pt was out of work for a week 2 weeks ago from fever, cough, sinus drainage, and fatigue. He notes his sxs have resolved but he still feels fatigue and has a superficial cough. Denies recent fever, SOB, cough, chest pain, and trouble eating or drinking.  A Fib.: Cardiologist is Dr. Martinique; office visit April 2017. His CHADS2VAsc score was 0. He was on asa 81 mg QD and deltiazam for rate control. Rare sxs, continued on same medication. Pt has an appt with Dr. Martinique next month. Denies flares of his A. Fib. He mainly notices it in the morning when he first wakes up.  GERD: He has taken omeprazole in the past Pt takes omeprazole daily. He has not had GERD since losing 20 lbs and understanding his triggers. Pt has had a gallbladder removed in the past. Denies ulcers.   Erectile dysfunction/Low Testosterone: He has receives testosterone injections in the past and sildenafil for ED. Last testosterone level March 2017 was nl. Pt continues to get testosterone injections 1x  a week and takes sildenafil; rx'ed by urologist Dr. Diona Fanti who pt visits annually, has some bloodwork there.  Lab Results  Component Value Date   PSA 1.38 10/29/2015   PSA 1.58 01/07/2015   PSA 1.62 06/01/2014   Cancer Screening: Prostate: Pt agrees to screening with DRE and blood work. Lab Results  Component Value Date   PSA 1.38 10/29/2015   PSA 1.58 01/07/2015   PSA 1.62 06/01/2014  Colon: He had a partial colectomy in 2011. Reported colonoscopy was in March 2013 which was nl. Pt has a follow-up colonoscopy next month with Dr. Michail Sermon. Reports some cancer on his maternal side.  Immunizations: Immunization History  Administered Date(s) Administered  . Influenza Split 04/30/2012, 05/26/2016  . Influenza,inj,Quad PF,36+ Mos 04/22/2013, 06/01/2014, 05/06/2015  . Tdap 04/21/2008  . Zoster 07/31/2014   Vision: Seen annually by Dr. Myrla Halsted Acuity Screening   Right eye Left eye Both eyes  Without correction:     With correction: 20/30 20/20 20/25    Dentist: Followed by Dr. Toy Cookey  Exercise: For 1 hour 1x a week. Pt parks far out and takes the stairs.  HIV/Hep C Screening: Pt would like to get screened for HIV and the Hep C screening was completed in Aug 2017 was negative.  Advance Directives: Pt has a living will and HCPOA and plans on sending a copy in.  Depression Screening: Depression screen [facility] 2/9 10/26/2016 10/26/2016 10/11/2016 02/17/2016 10/07/2015  Decreased Interest 0 0 0  0 0  Down, Depressed, Hopeless 0 0 0 0 0  PHQ - 2 Score 0 0 0 0 0     Patient Active Problem List   Diagnosis Date Noted  . Laceration of left hand without foreign body 10/11/2016  . Contusion of right elbow 10/11/2016  . Paroxysmal atrial fibrillation ([place]) 10/24/2012  . Hypercholesterolemia 04/03/2012  . ED (erectile dysfunction) 03/05/2012  . Hypogonadism male 03/05/2012   Past Medical History:  Diagnosis Date  . Abdominal pain   . Atrial fibrillation ([place])   . Chest pain     negative stress echo in September 2013  . Diverticular disease   . Diverticulitis   . GERD (gastroesophageal reflux disease)    Past Surgical History:  Procedure Laterality Date  . COLON SURGERY    . PARTIAL COLECTOMY  11/11   Allergies  Allergen Reactions  . Penicillins     rash   Prior to Admission medications   Medication Sig Start Date End Date Taking? Authorizing Provider  ALFALFA PO Take by mouth See admin instructions. Shaklee Alfalfa tabs 2.85 g alfalfa powder, 300 mg calcium   Yes Historical Provider, MD  aspirin 81 MG tablet Take 81 mg by mouth daily.   Yes Historical Provider, MD  calcium carbonate (OS-CAL - DOSED IN MG OF ELEMENTAL CALCIUM) 1250 MG tablet Take 2 tablets by mouth 2 (two) times daily with a meal.    Yes Historical Provider, MD  CARTIA XT 180 MG 24 hr capsule TAKE ONE CAPSULE BY MOUTH ONCE DAILY 01/10/16  Yes [name], MD  cholecalciferol (VITAMIN D) 1000 UNITS tablet Take 1,000 Units by mouth 2 (two) times daily.   Yes Historical Provider, MD  Garlic 7001 MG CAPS Take 1,000 mg by mouth daily.   Yes Historical Provider, MD  Multiple Vitamin (MULTIVITAMIN) tablet Take 1 tablet by mouth daily.   Yes Historical Provider, MD  Omega-3 Fatty Acids (OMEGA 3 PO) Take 2 capsules by mouth daily.    Yes Historical Provider, MD  omeprazole (PRILOSEC) 20 MG capsule Take 1 capsule (20 mg total) by mouth daily. 03/05/12  Yes [name], MD  sildenafil (REVATIO) 20 MG tablet Take by mouth. Take 1 to 5 tabs by mouth as needed for erectile dysfunction   Yes Historical Provider, MD  tacrolimus (PROTOPIC) 0.1 % ointment Apply 1 application topically daily.    Yes Historical Provider, MD  testosterone cypionate (DEPOTESTOSTERONE CYPIONATE) 200 MG/ML injection Inject into the muscle once a week.    Yes Historical Provider, MD   Social History   Social History  . Marital status: Married    Spouse name: N/A  . Number of children: 0  . Years of education: N/A    Occupational History  . attorney [employer]   Social History Main Topics  . Smoking status: Never Smoker  . Smokeless tobacco: Never Used  . Alcohol use No  . Drug use: No  . Sexual activity: Yes   Other Topics Concern  . Not on file   Social History Narrative   Married   Exercise: Yes   Education: College   Review of Systems  Constitutional: Positive for fatigue.  HENT: Positive for hearing loss.   Cardiovascular: Positive for palpitations.  Gastrointestinal: Positive for abdominal pain.  13 point ROS positive for the above Objective:  Physical Exam  Constitutional: He is oriented to person, place, and time. He appears well-developed and well-nourished.  HENT:  Head: Normocephalic and atraumatic.  Right Ear: External ear normal.  Left Ear: External ear normal.  Mouth/Throat: Oropharynx is clear and moist.  Moderate cerumen on the left canal Unable to visualize TM on the left  Eyes: Conjunctivae and EOM are normal. Pupils are equal, round, and reactive to light.  Neck: Normal range of motion. Neck supple. No thyromegaly present.  Cardiovascular: Normal rate, regular rhythm, normal heart sounds and intact distal pulses.   Pulmonary/Chest: Effort normal and breath sounds normal. No respiratory distress. He has no wheezes.  Abdominal: Soft. He exhibits no distension. There is no tenderness. Hernia confirmed negative in the right inguinal area and confirmed negative in the left inguinal area.  Genitourinary: Prostate normal.  Musculoskeletal: Normal range of motion. He exhibits no edema or tenderness.  Prominent nodularly at his right index finger DIP> PIP  Lymphadenopathy:    He has no cervical adenopathy.  Neurological: He is alert and oriented to person, place, and time. He has normal reflexes.  Skin: Skin is warm and dry.  Psychiatric: He has a normal mood and affect. His behavior is normal.  Vitals reviewed.   Vitals:   10/26/16 0827  BP: 125/76  Pulse:  63  Temp: 98.4 F (36.9 C)  TempSrc: Oral  SpO2: 99%  Weight: 188 lb 9.6 oz (85.5 kg)  Height: 6' (1.829 m)  Body mass index is 25.58 kg/m.   Small amount of cerumen removed with white curette, and lavage performed by staff with some improvement in hearing on the left. No complications. Assessment & Plan:   Nicholas West is a 61 y.o. male Annual physical exam  - -anticipatory guidance as below in AVS, screening labs above. Health maintenance items as above in HPI discussed/recommended as applicable.   Paroxysmal atrial fibrillation (HCC)  -stable, cont ASA, diltiazem and follow up with cardiology.   Postviral fatigue syndrome - Plan: CBC, TSH  - reassuring exam. Should improve with time. rtc precautions.   Encounter for screening for HIV - Plan: HIV antibody  Screening for diabetes mellitus - Plan: Comprehensive metabolic panel  Screening for hyperlipidemia - Plan: Lipid panel  Encounter for therapeutic drug monitoring - Plan: Magnesium, Vitamin B12, Vitamin D, 25-hydroxy  - Chronic daily PPI use. Monitor for deficiencies as above.  Gastroesophageal reflux disease, esophagitis presence not specified - Plan: Magnesium, Vitamin B12  - Recommended to continue to avoid triggers, trial of spacing out PPI to every other day or every third day, or alternatively can step down to H2 blocker. Monitoring as above  Impacted cerumen of left ear - Plan: Ear wax removal Hearing loss of left ear, unspecified hearing loss type - Plan: Ear wax removal  - Some cerumen removed with some improvement on left. Can try over-the-counter DeBrox, RTC precautions if persistent, or follow-up with ENT if persistent hearing loss.  No orders of the defined types were placed in this encounter.  Patient Instructions    For heartburn, try switching to zantac or spacing out dosing of omeprazole.   If finger pains worsen or other joint swelling, return to discuss further.   Follow up as planned with  cardiologist, and urology.  There was significant cerumen or earwax on the left side, with moderate cerumen on the right. If hearing loss persist after procedure today, still follow-up with Dr. Benjamine Mola as planned.  You can also try over-the-counter Debrox, or follow-up with me in the next week or 2 if that ear still feels full as there is some residual cerumen.  Fatigue should  improve in next week or two after recent viral infection. Your exam was reassuring today, but I will check your blood count, thyroid, and electrolyte tests. Return to the clinic or go to the nearest emergency room if any of your symptoms worsen or new symptoms occur.   Earwax Buildup Your ears make a substance called earwax. It may also be called cerumen. Sometimes, too much earwax builds up in your ear canal. This can cause ear pain and make it harder for you to hear. CAUSES This condition is caused by too much earwax production or buildup. RISK FACTORS The following factors may make you more likely to develop this condition:  Cleaning your ears often with swabs.  Having narrow ear canals.  Having earwax that is overly thick or sticky.  Having eczema.  Being dehydrated. SYMPTOMS Symptoms of this condition include:  Reduced hearing.  Ear drainage.  Ear pain.  Ear itch.  A feeling of fullness in the ear or feeling that the ear is plugged.  Ringing in the ear.  Coughing. DIAGNOSIS Your health care provider can diagnose this condition based on your symptoms and medical history. Your health care provider will also do an ear exam to look inside your ear with a scope (otoscope). You may also have a hearing test. TREATMENT Treatment for this condition includes:  Over-the-counter or prescription ear drops to soften the earwax.  Earwax removal by a health care provider. This may be done:  By flushing the ear with body-temperature water.  With a medical instrument that has a loop at the end (earwax  curette).  With a suction device. HOME CARE INSTRUCTIONS  Take over-the-counter and prescription medicines only as told by your health care provider.  Do not put any objects, including an ear swab, into your ear. You can clean the opening of your ear canal with a washcloth.  Drink enough water to keep your urine clear or pale yellow.  If you have frequent earwax buildup or you use hearing aids, consider seeing your health care provider every 6-12 months for routine preventive ear cleanings. Keep all follow-up visits as told by your health care provider. SEEK MEDICAL CARE IF:  You have ear pain.  Your condition does not improve with treatment.  You have hearing loss.  You have blood, pus, or other fluid coming from your ear. This information is not intended to replace advice given to you by your health care provider. Make sure you discuss any questions you have with your health care provider. Document Released: 08/24/2004 Document Revised: 11/08/2015 Document Reviewed: 03/03/2015 Elsevier Interactive Patient Education  2017 [place] you healthy  Get these tests  Blood pressure- Have your blood pressure checked once a year by your healthcare provider.  Normal blood pressure is 120/80  Weight- Have your body mass index (BMI) calculated to screen for obesity.  BMI is a measure of body fat based on height and weight. You can also calculate your own BMI at ViewBanking.si.  Cholesterol- Have your cholesterol checked every year.  Diabetes- Have your blood sugar checked regularly if you have high blood pressure, high cholesterol, have a family history of diabetes or if you are overweight.  Screening for Colon Cancer- Colonoscopy starting at age 68.  Screening may begin sooner depending on your family history and other health conditions. Follow up colonoscopy as directed by your Gastroenterologist.  Screening for Prostate Cancer- Both blood work (PSA) and a  rectal exam help screen for Prostate Cancer.  Screening begins at age 49 with African-American men and at age 13 with Caucasian men.  Screening may begin sooner depending on your family history.  Take these medicines  Aspirin- One aspirin daily can help prevent Heart disease and Stroke.  Flu shot- Every fall.  Tetanus- Every 10 years.  Zostavax- Once after the age of 63 to prevent Shingles.  Pneumonia shot- Once after the age of 66; if you are younger than 6, ask your healthcare provider if you need a Pneumonia shot.  Take these steps  Don't smoke- If you do smoke, talk to your doctor about quitting.  For tips on how to quit, go to www.smokefree.gov or call 1-800-QUIT-NOW.  Be physically active- Exercise 5 days a week for at least 30 minutes.  If you are not already physically active start slow and gradually work up to 30 minutes of moderate physical activity.  Examples of moderate activity include walking briskly, mowing the yard, dancing, swimming, bicycling, etc.  Eat a healthy diet- Eat a variety of healthy food such as fruits, vegetables, low fat milk, low fat cheese, yogurt, lean meant, poultry, fish, beans, tofu, etc. For more information go to www.thenutritionsource.org  Drink alcohol in moderation- Limit alcohol intake to less than two drinks a day. Never drink and drive.  Dentist- Brush and floss twice daily; visit your dentist twice a year.  Depression- Your emotional health is as important as your physical health. If you're feeling down, or losing interest in things you would normally enjoy please talk to your healthcare provider.  Eye exam- Visit your eye doctor every year.  Safe sex- If you may be exposed to a sexually transmitted infection, use a condom.  Seat belts- Seat belts can save your life; always wear one.  Smoke/Carbon Monoxide detectors- These detectors need to be installed on the appropriate level of your home.  Replace batteries at least once a  year.  Skin cancer- When out in the sun, cover up and use sunscreen 15 SPF or higher.  Violence- If anyone is threatening you, please tell your healthcare provider.  Living Will/ Health care power of attorney- Speak with your healthcare provider and family.   IF you received an x-ray today, you will receive an invoice from [facility]. Please contact [facility] at [phone] with questions or concerns regarding your invoice.   IF you received labwork today, you will receive an invoice from [place]. Please contact [facility] at [phone] with questions or concerns regarding your invoice.   Our billing staff will not be able to assist you with questions regarding bills from these companies.  You will be contacted with the lab results as soon as they are available. The fastest way to get your results is to activate your My Chart account. Instructions are located on the last page of this paperwork. If you have not heard from [place] regarding the results in 2 weeks, please contact this office.       I personally performed the services described in this documentation, which was scribed in my presence. The recorded information has been reviewed and considered for accuracy and completeness, addended by me as needed, and agree with information above.  Signed,   Merri Ray, MD Primary [facility] at [place].  10/26/16 10:17 AM

## 2016-10-27 LAB — LIPID PANEL
Chol/HDL Ratio: 3.9 ratio units (ref 0.0–5.0)
Cholesterol, Total: 148 mg/dL (ref 100–199)
HDL: 38 mg/dL — ABNORMAL LOW (ref >39)
LDL Calculated: 95 mg/dL (ref 0–99)
Triglycerides: 77 mg/dL (ref 0–149)
VLDL Cholesterol Cal: 15 mg/dL (ref 5–40)

## 2016-10-27 LAB — COMPREHENSIVE METABOLIC PANEL
ALT: 41 IU/L (ref 0–44)
AST: 31 IU/L (ref 0–40)
Albumin/Globulin Ratio: 1.7 (ref 1.2–2.2)
Albumin: 4.3 g/dL (ref 3.6–4.8)
Alkaline Phosphatase: 60 IU/L (ref 39–117)
BUN/Creatinine Ratio: 14 (ref 10–24)
BUN: 13 mg/dL (ref 8–27)
Bilirubin Total: 0.5 mg/dL (ref 0.0–1.2)
CO2: 23 mmol/L (ref 18–29)
Calcium: 9.1 mg/dL (ref 8.6–10.2)
Chloride: 102 mmol/L (ref 96–106)
Creatinine, Ser: 0.91 mg/dL (ref 0.76–1.27)
GFR calc Af Amer: 104 mL/min/{1.73_m2} (ref >59)
GFR calc non Af Amer: 90 mL/min/{1.73_m2} (ref >59)
Globulin, Total: 2.5 g/dL (ref 1.5–4.5)
Glucose: 83 mg/dL (ref 65–99)
Potassium: 4.5 mmol/L (ref 3.5–5.2)
Sodium: 143 mmol/L (ref 134–144)
Total Protein: 6.8 g/dL (ref 6.0–8.5)

## 2016-10-27 LAB — CBC
Hematocrit: 47.5 % (ref 37.5–51.0)
Hemoglobin: 16.2 g/dL (ref 13.0–17.7)
MCH: 29.5 pg (ref 26.6–33.0)
MCHC: 34.1 g/dL (ref 31.5–35.7)
MCV: 86 fL (ref 79–97)
Platelets: 192 10*3/uL (ref 150–379)
RBC: 5.5 x10E6/uL (ref 4.14–5.80)
RDW: 13.1 % (ref 12.3–15.4)
WBC: 7.1 10*3/uL (ref 3.4–10.8)

## 2016-10-27 LAB — MAGNESIUM: Magnesium: 2.2 mg/dL (ref 1.6–2.3)

## 2016-10-27 LAB — HIV ANTIBODY (ROUTINE TESTING W REFLEX): HIV Screen 4th Generation wRfx: NONREACTIVE

## 2016-10-27 LAB — VITAMIN D 25 HYDROXY (VIT D DEFICIENCY, FRACTURES): Vit D, 25-Hydroxy: 46.1 ng/mL (ref 30.0–100.0)

## 2016-10-27 LAB — TSH: TSH: 2.07 u[IU]/mL (ref 0.450–4.500)

## 2016-10-27 LAB — VITAMIN B12: Vitamin B-12: 1369 pg/mL — ABNORMAL HIGH (ref 232–1245)

## 2016-11-10 DIAGNOSIS — Z980 Intestinal bypass and anastomosis status: Secondary | ICD-10-CM | POA: Diagnosis not present

## 2016-11-10 DIAGNOSIS — K640 First degree hemorrhoids: Secondary | ICD-10-CM | POA: Diagnosis not present

## 2016-11-10 DIAGNOSIS — Z86010 Personal history of colonic polyps: Secondary | ICD-10-CM | POA: Diagnosis not present

## 2016-12-27 ENCOUNTER — Other Ambulatory Visit: Payer: Self-pay | Admitting: *Deleted

## 2016-12-27 MED ORDER — DILTIAZEM HCL ER COATED BEADS 180 MG PO CP24: 180.0000 mg | ORAL_CAPSULE | Freq: Every day | ORAL | 0 refills | Status: DC

## 2016-12-28 NOTE — Progress Notes (Signed)
Nicholas West Date of Birth: 06/24/1956 Medical Record #[id number]  History of Present Illness: Nicholas West is seen for follow up of atrial fibrillation.   He has a history of paroxysmal atrial fibrillation. He has been managed with rate control with diltiazem. He has a  Mali vascular score of 0. He is taking a baby aspirin daily.   On follow up today he is doing well. He has rare episodes of Afib usually in the early morning. These last a few minutes at a time.  No significant symptoms during the day. No chest pain or dyspnea. He does exercise on the weekends but not much during the week. He does note problems with loss of hearing and may need hearing aids.   Current Outpatient Prescriptions on File Prior to Visit  Medication Sig Dispense Refill  . ALFALFA PO Take by mouth See admin instructions. Shaklee Alfalfa tabs 2.85 g alfalfa powder, 300 mg calcium    . aspirin 81 MG tablet Take 81 mg by mouth daily.    . calcium carbonate (OS-CAL - DOSED IN MG OF ELEMENTAL CALCIUM) 1250 MG tablet Take 2 tablets by mouth 2 (two) times daily with a meal.     . cholecalciferol (VITAMIN D) 1000 UNITS tablet Take 1,000 Units by mouth 2 (two) times daily.    Marland Kitchen diltiazem (CARTIA XT) 180 MG 24 hr capsule Take 1 capsule (180 mg total) by mouth daily. PT NEEDS APPT, CALL [phone] TO SCHEDULE, 1ST ATTEMPT 30 capsule 0  . Garlic 3267 MG CAPS Take 1,000 mg by mouth daily.    . Multiple Vitamin (MULTIVITAMIN) tablet Take 1 tablet by mouth daily.    . Omega-3 Fatty Acids (OMEGA 3 PO) Take 2 capsules by mouth daily.     Marland Kitchen omeprazole (PRILOSEC) 20 MG capsule Take 1 capsule (20 mg total) by mouth daily. 30 capsule 11  . sildenafil (REVATIO) 20 MG tablet Take by mouth. Take 1 to 5 tabs by mouth as needed for erectile dysfunction    . tacrolimus (PROTOPIC) 0.1 % ointment Apply 1 application topically daily.     Marland Kitchen testosterone cypionate (DEPOTESTOSTERONE CYPIONATE) 200 MG/ML injection Inject into the muscle once a week.       No current facility-administered medications on file prior to visit.     Allergies  Allergen Reactions  . Penicillins     rash    Past Medical History:  Diagnosis Date  . Abdominal pain   . Atrial fibrillation ([place])   . Chest pain    negative stress echo in September 2013  . Diverticular disease   . Diverticulitis   . GERD (gastroesophageal reflux disease)     Past Surgical History:  Procedure Laterality Date  . COLON SURGERY    . PARTIAL COLECTOMY  11/11    History  Smoking Status  . Never Smoker  Smokeless Tobacco  . Never Used    History  Alcohol Use No    Family History  Problem Relation Age of Onset  . Cancer Mother   . Alzheimer's disease Father   . Colon polyps Father   . GER disease Father   . Heart disease Paternal Grandmother   . Cancer Paternal Grandfather     Review of Systems: The review of systems is per the HPI.  All other systems were reviewed and are negative.  Physical Exam: BP 122/70   Pulse 76   Ht 6' (1.829 m)   Wt 186 lb 6.4 oz (84.6 kg)  BMI 25.28 kg/m  Patient is very pleasant and in no acute distress. Skin is warm and dry. Color is normal.  HEENT is unremarkable. Normocephalic/atraumatic. PERRL. Sclera are nonicteric. Neck is supple. No masses. No JVD. Lungs are clear. Cardiac exam shows a regular rate and rhythm. Normal S1-2. No gallop or murmur. Abdomen is soft. Extremities are without edema. Gait and ROM are intact. No gross neurologic deficits noted.  LABORATORY DATA:   Lab Results  Component Value Date   WBC 7.1 10/26/2016   HGB 16.8 02/17/2016   HCT 47.5 10/26/2016   PLT 192 10/26/2016   GLUCOSE 83 10/26/2016   CHOL 148 10/26/2016   TRIG 77 10/26/2016   HDL 38 (L) 10/26/2016   LDLCALC 95 10/26/2016   ALT 41 10/26/2016   AST 31 10/26/2016   NA 143 10/26/2016   K 4.5 10/26/2016   CL 102 10/26/2016   CREATININE 0.91 10/26/2016   BUN 13 10/26/2016   CO2 23 10/26/2016   TSH 2.070 10/26/2016   PSA 1.38  10/29/2015   INR 1.10 06/04/2013   Ecg today shows NSR with normal Ecg. I have personally reviewed and interpreted this study.    Assessment / Plan: 1. Paroxysmal atrial fibrillation. Patient is minimally symptomatic. He has a  Mali vascular score of 0.  Continue baby aspirin and diltiazem daily.    2.  Prior normal stress echo and Echo  Follow up in one year

## 2016-12-29 ENCOUNTER — Encounter: Payer: Self-pay | Admitting: Cardiology

## 2016-12-29 ENCOUNTER — Ambulatory Visit (INDEPENDENT_AMBULATORY_CARE_PROVIDER_SITE_OTHER): Payer: BLUE CROSS/BLUE SHIELD | Admitting: Cardiology

## 2016-12-29 VITALS — BP 122/70 | HR 76 | Ht 72.0 in | Wt 186.4 lb

## 2016-12-29 DIAGNOSIS — I480 Paroxysmal atrial fibrillation: Secondary | ICD-10-CM

## 2016-12-29 DIAGNOSIS — E7800 Pure hypercholesterolemia, unspecified: Secondary | ICD-10-CM | POA: Diagnosis not present

## 2016-12-29 NOTE — Addendum Note (Signed)
Addended by: Kathyrn Lass on: 12/29/2016 08:45 AM   Modules accepted: Orders

## 2016-12-29 NOTE — Patient Instructions (Signed)
Continue your current therapy  I will see you in one year   

## 2017-01-24 ENCOUNTER — Other Ambulatory Visit: Payer: Self-pay | Admitting: *Deleted

## 2017-01-26 MED ORDER — DILTIAZEM HCL ER COATED BEADS 180 MG PO CP24: 180.0000 mg | ORAL_CAPSULE | Freq: Every day | ORAL | 11 refills | Status: DC

## 2017-02-07 DIAGNOSIS — Z125 Encounter for screening for malignant neoplasm of prostate: Secondary | ICD-10-CM | POA: Diagnosis not present

## 2017-02-07 DIAGNOSIS — E291 Testicular hypofunction: Secondary | ICD-10-CM | POA: Diagnosis not present

## 2017-02-07 DIAGNOSIS — H6122 Impacted cerumen, left ear: Secondary | ICD-10-CM | POA: Diagnosis not present

## 2017-02-07 DIAGNOSIS — H903 Sensorineural hearing loss, bilateral: Secondary | ICD-10-CM | POA: Diagnosis not present

## 2017-02-14 DIAGNOSIS — E291 Testicular hypofunction: Secondary | ICD-10-CM | POA: Diagnosis not present

## 2017-02-14 DIAGNOSIS — N5201 Erectile dysfunction due to arterial insufficiency: Secondary | ICD-10-CM | POA: Diagnosis not present

## 2017-06-06 DIAGNOSIS — K5900 Constipation, unspecified: Secondary | ICD-10-CM | POA: Diagnosis not present

## 2017-06-06 DIAGNOSIS — K219 Gastro-esophageal reflux disease without esophagitis: Secondary | ICD-10-CM | POA: Diagnosis not present

## 2017-08-15 ENCOUNTER — Encounter: Payer: Self-pay | Admitting: Physician Assistant

## 2017-08-15 ENCOUNTER — Ambulatory Visit (INDEPENDENT_AMBULATORY_CARE_PROVIDER_SITE_OTHER): Payer: BLUE CROSS/BLUE SHIELD | Admitting: Physician Assistant

## 2017-08-15 VITALS — BP 118/82 | HR 96 | Temp 99.0°F | Resp 18 | Ht 72.0 in

## 2017-08-15 DIAGNOSIS — J069 Acute upper respiratory infection, unspecified: Secondary | ICD-10-CM

## 2017-08-15 MED ORDER — AZITHROMYCIN 250 MG PO TABS: ORAL_TABLET | ORAL | 0 refills | Status: AC

## 2017-08-15 NOTE — Progress Notes (Signed)
08/15/2017 12:54 PM   DOB: 11-29-1955 / MRN: [id number]  SUBJECTIVE: This is ready to recheck  Nicholas West is a 62 y.o. male presenting for nasal congestion that started about 14 days ago.  He notes that he had a mild reprieve at about day 7 of illness but on day 10 began  having increasing nasal congestion and sinus pain.  Feels that he is getting worse at this time.  Has tried several over-the-counter medications all with transient relief of symptoms.  Denies fever, teeth pain, headache.  Wife has also been sick at home.  He is allergic to penicillins.   He  has a past medical history of Abdominal pain, Atrial fibrillation ([place]), Chest pain, Diverticular disease, Diverticulitis, and GERD (gastroesophageal reflux disease).    He  reports that  has never smoked. he has never used smokeless tobacco. He reports that he does not drink alcohol or use drugs. He  reports that he currently engages in sexual activity. The patient  has a past surgical history that includes Partial colectomy (11/11) and Colon surgery.  His family history includes Alzheimer's disease in his father; Cancer in his mother and paternal grandfather; Colon polyps in his father; GER disease in his father; Heart disease in his paternal grandmother.  Review of Systems  Constitutional: Negative for chills, diaphoresis and fever.  HENT: Positive for congestion and sinus pain. Negative for ear discharge, ear pain, hearing loss and tinnitus.   Eyes: Negative.   Respiratory: Negative for cough, hemoptysis, sputum production, shortness of breath and wheezing.   Cardiovascular: Negative for chest pain, orthopnea and leg swelling.  Gastrointestinal: Negative for nausea.  Skin: Negative for rash.  Neurological: Negative for dizziness, sensory change, speech change, focal weakness and headaches.    The problem list and medications were reviewed and updated by myself where necessary and exist elsewhere in the encounter.    OBJECTIVE:  BP 118/82 (BP Location: Right Arm, Patient Position: Sitting, Cuff Size: Normal)   Pulse 96   Temp 99 F (37.2 C) (Oral)   Resp 18   Ht 6' (1.829 m)   SpO2 96%   BMI 25.28 kg/m   Physical Exam  Constitutional: He appears well-developed. He is active and cooperative.  Non-toxic appearance.  HENT:  Right Ear: Hearing, tympanic membrane, external ear and ear canal normal.  Left Ear: Hearing, tympanic membrane, external ear and ear canal normal.  Nose: Nose normal. Right sinus exhibits no maxillary sinus tenderness and no frontal sinus tenderness. Left sinus exhibits no maxillary sinus tenderness and no frontal sinus tenderness.  Mouth/Throat: Uvula is midline, oropharynx is clear and moist and mucous membranes are normal. No oropharyngeal exudate, posterior oropharyngeal edema or tonsillar abscesses.  Eyes: Conjunctivae are normal. Pupils are equal, round, and reactive to light.  Cardiovascular: Normal rate, regular rhythm, S1 normal, S2 normal, normal heart sounds, intact distal pulses and normal pulses. Exam reveals no gallop and no friction rub.  No murmur heard. Pulmonary/Chest: Effort normal. No stridor. No tachypnea. No respiratory distress. He has no wheezes. He has no rales.  Abdominal: He exhibits no distension.  Musculoskeletal: He exhibits no edema.  Lymphadenopathy:       Head (right side): No submandibular and no tonsillar adenopathy present.       Head (left side): No submandibular and no tonsillar adenopathy present.    He has no cervical adenopathy.  Neurological: He is alert.  Skin: Skin is warm and dry. He is not diaphoretic. No  pallor.  Vitals reviewed.   No results found for this or any previous visit (from the past 72 hour(s)).  No results found.  ASSESSMENT AND PLAN:  Nicholas West was seen today for uri.  Diagnoses and all orders for this visit:  Acute URI Comments: He has a double sickening.  He has been sick for 2 weeks.  We will try him on  an antibiotic.  He will continue his over-the-counter therapies.  RTC in 10 days i -     azithromycin (ZITHROMAX) 250 MG tablet; Take 2 tabs PO x 1 dose, then 1 tab PO QD x 4 days    The patient is advised to call or return to clinic if he does not see an improvement in symptoms, or to seek the care of the closest emergency department if he worsens with the above plan.   Philis Fendt, MHS, PA-C [facility] 08/15/2017 12:54 PM

## 2017-08-15 NOTE — Patient Instructions (Addendum)
You may have a virus.  However given you are 2 weeks of symptoms and/or double sickening I think it is fair to try you on an antibiotic.  Please continue your over-the-counter therapies.  Pseudoephedrine 30-60 mg every 6-8 hours may also be very helpful.  You could try taking Afrin at night to help you breathe through your nose, however did not use this for more than 3 days in a row.  Come back in 10 days if you are still not feeling better.   IF you received an x-ray today, you will receive an invoice from [facility]. Please contact [facility] at [phone] with questions or concerns regarding your invoice.   IF you received labwork today, you will receive an invoice from [facility]. Please contact [facility] at [phone] with questions or concerns regarding your invoice.   Our billing staff will not be able to assist you with questions regarding bills from these companies.  You will be contacted with the lab results as soon as they are available. The fastest way to get your results is to activate your My Chart account. Instructions are located on the last page of this paperwork. If you have not heard from [place] regarding the results in 2 weeks, please contact this office.

## 2017-08-23 ENCOUNTER — Other Ambulatory Visit: Payer: Self-pay

## 2017-08-23 ENCOUNTER — Encounter: Payer: Self-pay | Admitting: Physician Assistant

## 2017-08-23 ENCOUNTER — Ambulatory Visit (INDEPENDENT_AMBULATORY_CARE_PROVIDER_SITE_OTHER): Payer: BLUE CROSS/BLUE SHIELD | Admitting: Physician Assistant

## 2017-08-23 VITALS — BP 137/86 | HR 86 | Temp 98.5°F | Resp 16 | Ht 72.0 in | Wt 186.0 lb

## 2017-08-23 DIAGNOSIS — H6983 Other specified disorders of Eustachian tube, bilateral: Secondary | ICD-10-CM | POA: Diagnosis not present

## 2017-08-23 MED ORDER — FLUTICASONE PROPIONATE 50 MCG/ACT NA SUSP: 2.0000 | Freq: Every day | NASAL | 12 refills | Status: DC

## 2017-08-23 MED ORDER — HYDROCORTISONE-ACETIC ACID 1-2 % OT SOLN: 4.0000 [drp] | Freq: Three times a day (TID) | OTIC | 0 refills | Status: DC

## 2017-08-23 MED ORDER — LEVOFLOXACIN 500 MG PO TABS: 500.0000 mg | ORAL_TABLET | Freq: Every day | ORAL | 0 refills | Status: DC

## 2017-08-23 MED ORDER — PREDNISONE 20 MG PO TABS: 30.0000 mg | ORAL_TABLET | Freq: Every day | ORAL | 0 refills | Status: DC

## 2017-08-23 NOTE — Progress Notes (Signed)
PRIMARY CARE AT [facility] [address], [place] [address] [phone]  Date:  08/23/2017   Name:  Nicholas West   DOB:  January 21, 1956   MRN:  [id number]  PCP:  Wendie Agreste, MD    History of Present Illness:  Nicholas West is a 62 y.o. male patient who presents to PCP with  Chief Complaint  Patient presents with  . Ear Pain    pt states he has pain when he chews on right side/ both ears slight pain, pt is over coming a cold     Symptoms started 3 weeks ago, where he had cold symptoms of post nasal drainage, scratchy throat, and he was taking cold medicine.  His symptoms lasted for 1 week with the appearance of it resolving.  He had symptoms reoccur and was seen by PA-Clark.  He gave an abx He continues to take the cold medicine, but in the last few days, he has symptoms of ear discomfort, feels like he is in a barrel.  Difficulty with hearing.  He feels like his right side mores so with left ear.  Aggravated with chewing.  He notes improvement, but feels today more onset of fatigue. Some cough.  No drainage from ear.  He has not had any fever or chills at this time.   Sick contact of wife He has a hx of ear infections.  History of tube placement in adulthood.  Patient Active Problem List   Diagnosis Date Noted  . Laceration of left hand without foreign body 10/11/2016  . Contusion of right elbow 10/11/2016  . Paroxysmal atrial fibrillation ([place]) 10/24/2012  . Hypercholesterolemia 04/03/2012  . ED (erectile dysfunction) 03/05/2012  . Hypogonadism male 03/05/2012    Past Medical History:  Diagnosis Date  . Abdominal pain   . Atrial fibrillation ([place])   . Chest pain    negative stress echo in September 2013  . Diverticular disease   . Diverticulitis   . GERD (gastroesophageal reflux disease)     Past Surgical History:  Procedure Laterality Date  . COLON SURGERY    . PARTIAL COLECTOMY  11/11    Social History   Tobacco Use  . Smoking status: Never Smoker  .  Smokeless tobacco: Never Used  Substance Use Topics  . Alcohol use: No  . Drug use: No    Family History  Problem Relation Age of Onset  . Cancer Mother   . Alzheimer's disease Father   . Colon polyps Father   . GER disease Father   . Heart disease Paternal Grandmother   . Cancer Paternal Grandfather     Allergies  Allergen Reactions  . Penicillins     rash    Medication list has been reviewed and updated.  Current Outpatient Medications on File Prior to Visit  Medication Sig Dispense Refill  . aspirin 81 MG tablet Take 81 mg by mouth daily.    . calcium carbonate (OS-CAL - DOSED IN MG OF ELEMENTAL CALCIUM) 1250 MG tablet Take 2 tablets by mouth 2 (two) times daily with a meal.     . cholecalciferol (VITAMIN D) 1000 UNITS tablet Take 1,000 Units by mouth 2 (two) times daily.    Marland Kitchen diltiazem (CARTIA XT) 180 MG 24 hr capsule Take 1 capsule (180 mg total) by mouth daily. 30 capsule 11  . Multiple Vitamin (MULTIVITAMIN) tablet Take 1 tablet by mouth daily.    . Omega-3 Fatty Acids (OMEGA 3 PO) Take 2 capsules by  mouth daily.     Marland Kitchen omeprazole (PRILOSEC) 20 MG capsule Take 1 capsule (20 mg total) by mouth daily. 30 capsule 11  . Probiotic Product (PROBIOTIC-10 PO) Take 1 tablet by mouth daily.    . sildenafil (REVATIO) 20 MG tablet Take by mouth. Take 1 to 5 tabs by mouth as needed for erectile dysfunction    . tacrolimus (PROTOPIC) 0.1 % ointment Apply 1 application topically daily.     Marland Kitchen testosterone cypionate (DEPOTESTOSTERONE CYPIONATE) 200 MG/ML injection Inject into the muscle once a week.      No current facility-administered medications on file prior to visit.     ROS ROS otherwise unremarkable unless listed above.  Physical Examination: BP 137/86   Pulse 86   Temp 98.5 F (36.9 C) (Oral)   Resp 16   Ht 6' (1.829 m)   Wt 186 lb (84.4 kg)   SpO2 95%   BMI 25.23 kg/m  Ideal Body Weight: Weight in (lb) to have BMI = 25: 183.9  Physical Exam  Constitutional: He  is oriented to person, place, and time. He appears well-developed and well-nourished. No distress.  HENT:  Head: Normocephalic and atraumatic.  Right Ear: External ear normal. No mastoid tenderness. Tympanic membrane is injected (injected malleolus, without erythema of the tm.  appears possible retracted.  mildy erythematous at the canal.).  Left Ear: Tympanic membrane, external ear and ear canal normal. No mastoid tenderness.  Nose: Mucosal edema present. No rhinorrhea. Right sinus exhibits no maxillary sinus tenderness and no frontal sinus tenderness. Left sinus exhibits no maxillary sinus tenderness and no frontal sinus tenderness.  Mouth/Throat: No uvula swelling. No oropharyngeal exudate, posterior oropharyngeal edema or posterior oropharyngeal erythema.  Eyes: Conjunctivae, EOM and lids are normal. Pupils are equal, round, and reactive to light. Right eye exhibits normal extraocular motion. Left eye exhibits normal extraocular motion.  Neck: Trachea normal and full passive range of motion without pain. No edema and no erythema present.  Cardiovascular: Normal rate.  Pulmonary/Chest: Effort normal. No respiratory distress. He has no decreased breath sounds. He has no wheezes. He has no rhonchi.  Lymphadenopathy:       Head (right side): No preauricular and no posterior auricular adenopathy present.       Head (left side): No preauricular and no posterior auricular adenopathy present.  Neurological: He is alert and oriented to person, place, and time.  Skin: Skin is warm and dry. He is not diaphoretic.  Psychiatric: He has a normal mood and affect. His behavior is normal.     Assessment and Plan: Nicholas West is a 62 y.o. male who is here today for cc of  Chief Complaint  Patient presents with  . Ear Pain    pt states he has pain when he chews on right side/ both ears slight pain, pt is over coming a cold  --will treat at this time.  Given Levaquin at this time without known  information of allergic reaction. Dysfunction of both eustachian tubes - Plan: predniSONE (DELTASONE) 20 MG tablet, fluticasone (FLONASE) 50 MCG/ACT nasal spray, acetic acid-hydrocortisone (VOSOL-HC) OTIC solution  Ivar Drape, PA-C [facility] 1/24/20191:11 PM

## 2017-08-23 NOTE — Patient Instructions (Addendum)
Please use tylenol for your ear pain.  This can be 500mg  every 6 hours, for example. Eustachian Tube Dysfunction The eustachian tube connects the middle ear to the back of the nose. It regulates air pressure in the middle ear by allowing air to move between the ear and nose. It also helps to drain fluid from the middle ear space. When the eustachian tube does not function properly, air pressure, fluid, or both can build up in the middle ear. Eustachian tube dysfunction can affect one or both ears. What are the causes? This condition happens when the eustachian tube becomes blocked or cannot open normally. This may result from:  Ear infections.  Colds and other upper respiratory infections.  Allergies.  Irritation, such as from cigarette smoke or acid from the stomach coming up into the esophagus (gastroesophageal reflux).  Sudden changes in air pressure, such as from descending in an airplane.  Abnormal growths in the nose or throat, such as nasal polyps, tumors, or enlarged tissue at the back of the throat (adenoids).  What increases the risk? This condition may be more likely to develop in people who smoke and people who are overweight. Eustachian tube dysfunction may also be more likely to develop in children, especially children who have:  Certain birth defects of the mouth, such as cleft palate.  Large tonsils and adenoids.  What are the signs or symptoms? Symptoms of this condition may include:  A feeling of fullness in the ear.  Ear pain.  Clicking or popping noises in the ear.  Ringing in the ear.  Hearing loss.  Loss of balance.  Symptoms may get worse when the air pressure around you changes, such as when you travel to an area of high elevation or fly on an airplane. How is this diagnosed? This condition may be diagnosed based on:  Your symptoms.  A physical exam of your ear, nose, and throat.  Tests, such as those that measure: ? The movement of your  eardrum (tympanogram). ? Your hearing (audiometry).  How is this treated? Treatment depends on the cause and severity of your condition. If your symptoms are mild, you may be able to relieve your symptoms by moving air into ("popping") your ears. If you have symptoms of fluid in your ears, treatment may include:  Decongestants.  Antihistamines.  Nasal sprays or ear drops that contain medicines that reduce swelling (steroids).  In some cases, you may need to have a procedure to drain the fluid in your eardrum (myringotomy). In this procedure, a small tube is placed in the eardrum to:  Drain the fluid.  Restore the air in the middle ear space.  Follow these instructions at home:  Take over-the-counter and prescription medicines only as told by your health care provider.  Use techniques to help pop your ears as recommended by your health care provider. These may include: ? Chewing gum. ? Yawning. ? Frequent, forceful swallowing. ? Closing your mouth, holding your nose closed, and gently blowing as if you are trying to blow air out of your nose.  Do not do any of the following until your health care provider approves: ? Travel to high altitudes. ? Fly in airplanes. ? Work in a Pension scheme manager or room. ? Scuba dive.  Keep your ears dry. Dry your ears completely after showering or bathing.  Do not smoke.  Keep all follow-up visits as told by your health care provider. This is important. Contact a health care provider if:  Your  symptoms do not go away after treatment.  Your symptoms come back after treatment.  You are unable to pop your ears.  You have: ? A fever. ? Pain in your ear. ? Pain in your head or neck. ? Fluid draining from your ear.  Your hearing suddenly changes.  You become very dizzy.  You lose your balance. This information is not intended to replace advice given to you by your health care provider. Make sure you discuss any questions you have with  your health care provider. Document Released: 08/13/2015 Document Revised: 12/23/2015 Document Reviewed: 08/05/2014 Elsevier Interactive Patient Education  2018 [employer].     IF you received an x-ray today, you will receive an invoice from [facility]. Please contact [facility] at [phone] with questions or concerns regarding your invoice.   IF you received labwork today, you will receive an invoice from [facility]. Please contact [facility] at [phone] with questions or concerns regarding your invoice.   Our billing staff will not be able to assist you with questions regarding bills from these companies.  You will be contacted with the lab results as soon as they are available. The fastest way to get your results is to activate your My Chart account. Instructions are located on the last page of this paperwork. If you have not heard from [place] regarding the results in 2 weeks, please contact this office.

## 2017-08-24 ENCOUNTER — Telehealth: Payer: Self-pay | Admitting: Cardiology

## 2017-08-24 DIAGNOSIS — I480 Paroxysmal atrial fibrillation: Secondary | ICD-10-CM

## 2017-08-24 NOTE — Telephone Encounter (Signed)
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 He reports symptoms of nausea and tingling in both arms & hands after exercising. His symptoms have occured 4-5 times over the last month or more. No chest pain & no SOB associated w/symptoms. Symptoms alleviated with rest.  His exercise routing is about 1 hour, consisting of weight lifting and cardio on an octane stationary bike. The bike measures his HR (not sure how accurate) and it is generally 120-130bpm but occasionally up to 160-165bpm.   Will route to MD to review and advise on symptoms.

## 2017-08-24 NOTE — Telephone Encounter (Signed)
Spoke to patient Dr.Jordan advised to have treadmill.Scheduler will call back to schedule.

## 2017-08-24 NOTE — Telephone Encounter (Signed)
It has been over 5 years since his last stress test. I would recommend scheduling him for an ETT (POET) to evaluate these symptoms.  Peter Martinique MD, [facility]

## 2017-08-24 NOTE — Telephone Encounter (Signed)
New message    Patient calling with concerns of nausea and tingling in both arms and hands after exercising 1 month ago. No chest pain.

## 2017-08-29 ENCOUNTER — Telehealth (HOSPITAL_COMMUNITY): Payer: Self-pay

## 2017-08-29 NOTE — Telephone Encounter (Signed)
Encounter complete. 

## 2017-08-30 DIAGNOSIS — H2513 Age-related nuclear cataract, bilateral: Secondary | ICD-10-CM | POA: Diagnosis not present

## 2017-08-30 DIAGNOSIS — H5213 Myopia, bilateral: Secondary | ICD-10-CM | POA: Diagnosis not present

## 2017-08-30 DIAGNOSIS — H04123 Dry eye syndrome of bilateral lacrimal glands: Secondary | ICD-10-CM | POA: Diagnosis not present

## 2017-08-31 ENCOUNTER — Ambulatory Visit (HOSPITAL_COMMUNITY)
Admission: RE | Admit: 2017-08-31 | Discharge: 2017-08-31 | Disposition: A | Discharge status: Home / Self Care | Payer: BLUE CROSS/BLUE SHIELD | Source: Ambulatory Visit | Attending: Cardiology | Admitting: Cardiology

## 2017-08-31 ENCOUNTER — Encounter (HOSPITAL_COMMUNITY): Payer: Self-pay | Admitting: *Deleted

## 2017-08-31 DIAGNOSIS — I480 Paroxysmal atrial fibrillation: Secondary | ICD-10-CM | POA: Insufficient documentation

## 2017-08-31 NOTE — Progress Notes (Unsigned)
Patient was in AFIB at the beginning of test. EKG was reviewed by DR. Hochrein and he gave the ok to start the test. Patient had an abnormal study which also was reviewed by Dr. Percival Spanish. He requested that the patient schedule a follow-up appointment with DR. Martinique. Appointment was made for 09/04/17.

## 2017-09-03 LAB — EXERCISE TOLERANCE TEST
Estimated workload: 10.1 METS
Exercise duration (min): 9 min
Exercise duration (sec): 0 s
MPHR: 157 {beats}/min
Peak HR: 193 {beats}/min
Percent HR: 122 %
RPE: 18
Rest HR: 98 {beats}/min

## 2017-09-03 NOTE — Progress Notes (Signed)
Nicholas West Date of Birth: July 03, 1956 Medical Record #[id number]  History of Present Illness: Nicholas West is seen for follow up of atrial fibrillation.   He has a history of paroxysmal atrial fibrillation. He has been managed with rate control with diltiazem. He has a  Mali vascular score of 0. He is taking a baby aspirin daily.   He called recently noting symptoms of  Nausea and tingling in his arms with exercise. We arranged a POET. On arrival he was in AFib with rate 98. He exercised for 9 minutes on the Bruce protocol without symptoms or ST changes. He converted to NSR then went back into atrial flutter with RVR. He is seen today for follow up.   He notes that symptoms of Afib are somewhat more frequent and sometimes awakens him in the early morning. He states symptoms are mild. No dizziness, CP, dyspnea.   Current Outpatient Medications on File Prior to Visit  Medication Sig Dispense Refill  . aspirin 81 MG tablet Take 81 mg by mouth daily.    . calcium carbonate (OS-CAL - DOSED IN MG OF ELEMENTAL CALCIUM) 1250 MG tablet Take 2 tablets by mouth 2 (two) times daily with a meal.     . cholecalciferol (VITAMIN D) 1000 UNITS tablet Take 1,000 Units by mouth 2 (two) times daily.    . Multiple Vitamin (MULTIVITAMIN) tablet Take 1 tablet by mouth daily.    . Omega-3 Fatty Acids (OMEGA 3 PO) Take 2 capsules by mouth daily.     Marland Kitchen omeprazole (PRILOSEC) 20 MG capsule Take 1 capsule (20 mg total) by mouth daily. 30 capsule 11  . Probiotic Product (PROBIOTIC-10 PO) Take 1 tablet by mouth daily.    . sildenafil (REVATIO) 20 MG tablet Take by mouth. Take 1 to 5 tabs by mouth as needed for erectile dysfunction    . tacrolimus (PROTOPIC) 0.1 % ointment Apply 1 application topically daily.     Marland Kitchen testosterone cypionate (DEPOTESTOSTERONE CYPIONATE) 200 MG/ML injection Inject into the muscle once a week.      No current facility-administered medications on file prior to visit.     Allergies  Allergen  Reactions  . Penicillins     rash    Past Medical History:  Diagnosis Date  . Abdominal pain   . Atrial fibrillation ([place])   . Chest pain    negative stress echo in September 2013  . Diverticular disease   . Diverticulitis   . GERD (gastroesophageal reflux disease)     Past Surgical History:  Procedure Laterality Date  . COLON SURGERY    . PARTIAL COLECTOMY  11/11    Social History   Tobacco Use  Smoking Status Never Smoker  Smokeless Tobacco Never Used    Social History   Substance and Sexual Activity  Alcohol Use No    Family History  Problem Relation Age of Onset  . Cancer Mother   . Alzheimer's disease Father   . Colon polyps Father   . GER disease Father   . Heart disease Paternal Grandmother   . Cancer Paternal Grandfather     Review of Systems: The review of systems is per the HPI.  All other systems were reviewed and are negative.  Physical Exam: BP 124/78   Pulse 76   Ht 6' (1.829 m)   Wt 185 lb 6.4 oz (84.1 kg)   BMI 25.14 kg/m  GENERAL:  Well appearing WM in NAD HEENT:  PERRL, EOMI, sclera are clear. Oropharynx  is clear. NECK:  No jugular venous distention, carotid upstroke brisk and symmetric, no bruits, no thyromegaly or adenopathy LUNGS:  Clear to auscultation bilaterally CHEST:  Unremarkable HEART:  RRR,  PMI not displaced or sustained,S1 and S2 within normal limits, no S3, no S4: no clicks, no rubs, no murmurs ABD:  Soft, nontender. BS +, no masses or bruits. No hepatomegaly, no splenomegaly EXT:  2 + pulses throughout, no edema, no cyanosis no clubbing SKIN:  Warm and dry.  No rashes NEURO:  Alert and oriented x 3. Cranial nerves II through XII intact. PSYCH:  Cognitively intact    LABORATORY DATA:   Lab Results  Component Value Date   WBC 7.1 10/26/2016   HGB 16.2 10/26/2016   HCT 47.5 10/26/2016   PLT 192 10/26/2016   GLUCOSE 83 10/26/2016   CHOL 148 10/26/2016   TRIG 77 10/26/2016   HDL 38 (L) 10/26/2016   LDLCALC 95  10/26/2016   ALT 41 10/26/2016   AST 31 10/26/2016   NA 143 10/26/2016   K 4.5 10/26/2016   CL 102 10/26/2016   CREATININE 0.91 10/26/2016   BUN 13 10/26/2016   CO2 23 10/26/2016   TSH 2.070 10/26/2016   PSA 1.38 10/29/2015   INR 1.10 06/04/2013    ETT 08/31/17: Study Highlights    Blood pressure demonstrated a blunted response to exercise.  Patient started the test in atrial fibrillation and converted to sinus rhythm and subsequently atrial flutter with rapid ventricular response with exercise.  There was no ST segment deviation noted during stress.  Negative, adequate stress test.     Assessment / Plan: 1. Paroxysmal atrial fibrillation and fluttrer. Patient is mildly symptomatic. He has a  Mali vascular score of 0.  Continue baby aspirin and diltiazem daily.  I would like to increase the diltiazem dose to 240 mg daily. If he should develop more Afib that affects his quality of life then we could consider AAD therapy with Flecainide.   2.  ETT negative for ischemia.  Follow up in one year

## 2017-09-04 ENCOUNTER — Ambulatory Visit (INDEPENDENT_AMBULATORY_CARE_PROVIDER_SITE_OTHER): Payer: BLUE CROSS/BLUE SHIELD | Admitting: Cardiology

## 2017-09-04 ENCOUNTER — Encounter: Payer: Self-pay | Admitting: Cardiology

## 2017-09-04 VITALS — BP 124/78 | HR 76 | Ht 72.0 in | Wt 185.4 lb

## 2017-09-04 DIAGNOSIS — I480 Paroxysmal atrial fibrillation: Secondary | ICD-10-CM

## 2017-09-04 DIAGNOSIS — E7800 Pure hypercholesterolemia, unspecified: Secondary | ICD-10-CM

## 2017-09-04 MED ORDER — DILTIAZEM HCL ER COATED BEADS 240 MG PO CP24: 240.0000 mg | ORAL_CAPSULE | Freq: Every day | ORAL | 3 refills | Status: DC

## 2017-09-04 NOTE — Patient Instructions (Addendum)
We will increase Diltiazem SR to 240 mg daily  Continue your other therapy  Keep appointment with me on June 5

## 2017-09-05 ENCOUNTER — Other Ambulatory Visit: Payer: Self-pay | Admitting: Dermatology

## 2017-09-05 DIAGNOSIS — D485 Neoplasm of uncertain behavior of skin: Secondary | ICD-10-CM | POA: Diagnosis not present

## 2017-09-05 DIAGNOSIS — C44319 Basal cell carcinoma of skin of other parts of face: Secondary | ICD-10-CM | POA: Diagnosis not present

## 2017-10-19 DIAGNOSIS — C44319 Basal cell carcinoma of skin of other parts of face: Secondary | ICD-10-CM | POA: Diagnosis not present

## 2017-11-07 ENCOUNTER — Encounter: Payer: Self-pay | Admitting: Physician Assistant

## 2017-11-26 ENCOUNTER — Other Ambulatory Visit: Payer: Self-pay

## 2017-11-26 ENCOUNTER — Ambulatory Visit (INDEPENDENT_AMBULATORY_CARE_PROVIDER_SITE_OTHER): Payer: BLUE CROSS/BLUE SHIELD | Admitting: Family Medicine

## 2017-11-26 ENCOUNTER — Encounter: Payer: Self-pay | Admitting: Family Medicine

## 2017-11-26 VITALS — BP 110/72 | HR 68 | Temp 98.8°F | Ht 72.0 in | Wt 180.0 lb

## 2017-11-26 DIAGNOSIS — I480 Paroxysmal atrial fibrillation: Secondary | ICD-10-CM | POA: Diagnosis not present

## 2017-11-26 DIAGNOSIS — K5900 Constipation, unspecified: Secondary | ICD-10-CM

## 2017-11-26 DIAGNOSIS — Z1321 Encounter for screening for nutritional disorder: Secondary | ICD-10-CM | POA: Diagnosis not present

## 2017-11-26 DIAGNOSIS — Z79899 Other long term (current) drug therapy: Secondary | ICD-10-CM | POA: Diagnosis not present

## 2017-11-26 DIAGNOSIS — Z13220 Encounter for screening for lipoid disorders: Secondary | ICD-10-CM | POA: Diagnosis not present

## 2017-11-26 DIAGNOSIS — Z1329 Encounter for screening for other suspected endocrine disorder: Secondary | ICD-10-CM | POA: Diagnosis not present

## 2017-11-26 DIAGNOSIS — Z130 Encounter for screening for diseases of the blood and blood-forming organs and certain disorders involving the immune mechanism: Secondary | ICD-10-CM | POA: Diagnosis not present

## 2017-11-26 DIAGNOSIS — K219 Gastro-esophageal reflux disease without esophagitis: Secondary | ICD-10-CM | POA: Diagnosis not present

## 2017-11-26 DIAGNOSIS — Z0000 Encounter for general adult medical examination without abnormal findings: Secondary | ICD-10-CM

## 2017-11-26 NOTE — Patient Instructions (Addendum)
See info below on constipation. Miralax if needed. If you continue to have issues with BM's, I would recommend meeting with Dr. Michail Sermon.  I will check some screening lab work as we discussed, no changes in medications for now.  Thank you for coming in today.  Keeping you healthy  Get these tests  Blood pressure- Have your blood pressure checked once a year by your healthcare provider.  Normal blood pressure is 120/80  Weight- Have your body mass index (BMI) calculated to screen for obesity.  BMI is a measure of body fat based on height and weight. You can also calculate your own BMI at ViewBanking.[email or web address].  Cholesterol- Have your cholesterol checked every year.  Diabetes- Have your blood sugar checked regularly if you have high blood pressure, high cholesterol, have a family history of diabetes or if you are overweight.  Screening for Colon Cancer- Colonoscopy starting at age 35.  Screening may begin sooner depending on your family history and other health conditions. Follow up colonoscopy as directed by your Gastroenterologist.  Screening for Prostate Cancer- Both blood work (PSA) and a rectal exam help screen for Prostate Cancer.  Screening begins at age 65 with African-American men and at age 19 with Caucasian men.  Screening may begin sooner depending on your family history.  Take these medicines  Aspirin- One aspirin daily can help prevent Heart disease and Stroke.  Flu shot- Every fall.  Tetanus- Every 10 years.  Zostavax- Once after the age of 35 to prevent Shingles.  Pneumonia shot- Once after the age of 80; if you are younger than 38, ask your healthcare provider if you need a Pneumonia shot.  Take these steps  Don't smoke- If you do smoke, talk to your doctor about quitting.  For tips on how to quit, go to www.smokefree.gov or call 1-800-QUIT-NOW.  Be physically active- Exercise 5 days a week for at least 30 minutes.  If you are not already physically active start  slow and gradually work up to 30 minutes of moderate physical activity.  Examples of moderate activity include walking briskly, mowing the yard, dancing, swimming, bicycling, etc.  Eat a healthy diet- Eat a variety of healthy food such as fruits, vegetables, low fat milk, low fat cheese, yogurt, lean meant, poultry, fish, beans, tofu, etc. For more information go to www.thenutritionsource.org  Drink alcohol in moderation- Limit alcohol intake to less than two drinks a day. Never drink and drive.  Dentist- Brush and floss twice daily; visit your dentist twice a year.  Depression- Your emotional health is as important as your physical health. If you're feeling down, or losing interest in things you would normally enjoy please talk to your healthcare provider.  Eye exam- Visit your eye doctor every year.  Safe sex- If you may be exposed to a sexually transmitted infection, use a condom.  Seat belts- Seat belts can save your life; always wear one.  Smoke/Carbon Monoxide detectors- These detectors need to be installed on the appropriate level of your home.  Replace batteries at least once a year.  Skin cancer- When out in the sun, cover up and use sunscreen 15 SPF or higher.  Violence- If anyone is threatening you, please tell your healthcare provider.  Living Will/ Health care power of attorney- Speak with your healthcare provider and family.  Constipation, Adult Constipation is when a person has fewer bowel movements in a week than normal, has difficulty having a bowel movement, or has stools that are dry, hard, or  larger than normal. Constipation may be caused by an underlying condition. It may become worse with age if a person takes certain medicines and does not take in enough fluids. Follow these instructions at home: Eating and drinking   Eat foods that have a lot of fiber, such as fresh fruits and vegetables, whole grains, and beans.  Limit foods that are high in fat, low in fiber,  or overly processed, such as french fries, hamburgers, cookies, candies, and soda.  Drink enough fluid to keep your urine clear or pale yellow. General instructions  Exercise regularly or as told by your health care provider.  Go to the restroom when you have the urge to go. Do not hold it in.  Take over-the-counter and prescription medicines only as told by your health care provider. These include any fiber supplements.  Practice pelvic floor retraining exercises, such as deep breathing while relaxing the lower abdomen and pelvic floor relaxation during bowel movements.  Watch your condition for any changes.  Keep all follow-up visits as told by your health care provider. This is important. Contact a health care provider if:  You have pain that gets worse.  You have a fever.  You do not have a bowel movement after 4 days.  You vomit.  You are not hungry.  You lose weight.  You are bleeding from the anus.  You have thin, pencil-like stools. Get help right away if:  You have a fever and your symptoms suddenly get worse.  You leak stool or have blood in your stool.  Your abdomen is bloated.  You have severe pain in your abdomen.  You feel dizzy or you faint. This information is not intended to replace advice given to you by your health care provider. Make sure you discuss any questions you have with your health care provider. Document Released: 04/14/2004 Document Revised: 02/04/2016 Document Reviewed: 01/05/2016 Elsevier Interactive Patient Education  2018 [employer].    IF you received an x-ray today, you will receive an invoice from [facility]. Please contact [facility] at [phone] with questions or concerns regarding your invoice.   IF you received labwork today, you will receive an invoice from [facility]. Please contact [facility] at [phone] with questions or concerns regarding your invoice.   Our billing staff will not be able to  assist you with questions regarding bills from these companies.  You will be contacted with the lab results as soon as they are available. The fastest way to get your results is to activate your My Chart account. Instructions are located on the last page of this paperwork. If you have not heard from [place] regarding the results in 2 weeks, please contact this office.

## 2017-11-26 NOTE — Progress Notes (Signed)
Subjective:  By signing my name below, I, [name], attest that this documentation has been prepared under the direction and in the presence of Wendie Agreste, MD Electronically Signed: Ladene Artist, ED Scribe 11/26/2017 at 9:28 AM.   Patient ID: Nicholas West, male    DOB: Oct 14, 1955, 62 y.o.   MRN: [id number]  Chief Complaint  Patient presents with  . Annual Exam    CPE   HPI Nicholas West is a 62 y.o. male who presents to Primary Care at [facility] for an annual exam. Hyperlipemia, paroxsymal a-fib, hypogonadism/ED.  A-fib Diltiazem 240 mg for rate control, Asprin for anticoagulation. Dr. [place] with appointment in Feb. Dose diltiazem was increased to 240 mg at Feb visit. Neg ETT for ischemia. Considered possible addiction Flecainide if more symptomatic, f/u June 5 with cariology. - Pt states that he initially didn't notice a-fib but is able to feel it now. States he was having nausea with exercising prior to ETT which cardiology suspected he was over-doing it. Symptoms have since resolved.  ED Has taken sildenafil 20 mg prn and on testosterone supplementation 200 mg injection wkly. Testosterone managed by [place] with urology.  GERD Has taken Omeprazole daily in the past. Recommended spacing out PPI prn with trigger avoidance. Note reviewed from GI Dr. Michail Sermon from Nov. Needed to continue daily PPI. Recommended calcium and Vit D supplement. - Pt has tried Nexium and Prilosec every other day but still had breakthrough heartburn.  Constipation Pt reports intermittent constipation with the most recent episode being 2 wks ago. He has been using stool softeners and Miralax which was recommended by Dr. Michail Sermon. He has been eating a fiber bar every morning as well and tried to decrease sweet tea consumption.  CA Screening Colonoscopy: 10/02/11 Prostate CA Screening: followed by urology, PSA July 2018 1.47, plan for rpt in 1 yr Lab Results  Component Value Date   PSA 1.38  10/29/2015   PSA 1.58 01/07/2015   PSA 1.62 06/01/2014  Skin CA Screening: saw Dr. Denna Haggard a few yrs ago; had a few places removed by the skin surgery center  Immunizations Immunization History  Administered Date(s) Administered  . Influenza Split 04/30/2012, 05/26/2016  . Influenza,inj,Quad PF,6+ Mos 04/22/2013, 06/01/2014, 05/06/2015  . Tdap 04/21/2008  . Zoster 07/31/2014  Shingles: sent to pharmacy  Depression Screening Depression screen [facility] 2/9 11/26/2017 08/23/2017 08/15/2017 10/26/2016 10/26/2016  Decreased Interest 0 0 0 0 0  Down, Depressed, Hopeless 0 0 0 0 0  PHQ - 2 Score 0 0 0 0 0     Visual Acuity Screening   Right eye Left eye Both eyes  Without correction:     With correction: 20/40 20/30-1 20/20-1   Vision: regularly; wears contact lenses  Dentist: followed every 3 months Exercise: 30 mins of weights and 30 mins of cardio once/w  Review of Systems  Gastrointestinal: Positive for constipation (intermittent).      Objective:   Physical Exam  Constitutional: He is oriented to person, place, and time. He appears well-developed and well-nourished.  HENT:  Head: Normocephalic and atraumatic.  Right Ear: External ear normal.  Left Ear: External ear normal.  Mouth/Throat: Oropharynx is clear and moist.  Eyes: Pupils are equal, round, and reactive to light. Conjunctivae and EOM are normal.  Neck: Normal range of motion. Neck supple. No thyromegaly present.  Cardiovascular: Normal rate, regular rhythm, normal heart sounds and intact distal pulses.  Few ectopic beats  Pulmonary/Chest: Effort normal and breath sounds  normal. No respiratory distress. He has no wheezes.  Abdominal: Soft. He exhibits no distension. There is no tenderness.  Musculoskeletal: Normal range of motion. He exhibits no edema or tenderness.  Lymphadenopathy:    He has no cervical adenopathy.  Neurological: He is alert and oriented to person, place, and time. He has normal reflexes.  Skin: Skin  is warm and dry.  Psychiatric: He has a normal mood and affect. His behavior is normal.  Vitals reviewed. DRE deferred as followed by urology.   Vitals:   11/26/17 0900  BP: 110/72  Pulse: 68  Temp: 98.8 F (37.1 C)  TempSrc: Oral  SpO2: 98%  Weight: 180 lb (81.6 kg)  Height: 6' (1.829 m)      Assessment & Plan:   Nicholas West is a 62 y.o. male Annual physical exam  - -anticipatory guidance as below in AVS, screening labs above. Health maintenance items as above in HPI discussed/recommended as applicable.   Paroxysmal A-fib (HCC)  -Overall stable symptoms, tolerating higher dose of channel blocker.  Continue routine follow-up with cardiology, continue aspirin for anticoagulation.  Constipation, unspecified constipation type - Plan: TSH  -Prevention techniques discussed with increased hydration, fiber in the diet, MiraLAX if needed temporarily, and handout provided.  Will screen for thyroid disorder with TSH.  Gastroesophageal reflux disease, esophagitis presence not specified Use of proton pump inhibitor therapy - Plan: VITAMIN D 25 Hydroxy (Vit-D Deficiency, Fractures), Magnesium, Vitamin B12,Encounter for screening for nutritional disorder - Plan: VITAMIN D 25 Hydroxy (Vit-D Deficiency, Fractures), Magnesium, Vitamin B12  -Has required daily PPI for reflux symptoms.  Followed by gastroenterology.  Check for nutritional deficiencies as above.  Screening, anemia, deficiency, iron - Plan: CBC  Screening for thyroid disorder - Plan: TSH  Screening for hyperlipidemia - Plan: Comprehensive metabolic panel, Lipid panel   No orders of the defined types were placed in this encounter.  Patient Instructions   See info below on constipation. Miralax if needed. If you continue to have issues with BM's, I would recommend meeting with Dr. Michail Sermon.  I will check some screening lab work as we discussed, no changes in medications for now.  Thank you for coming in today.  Keeping  you healthy  Get these tests  Blood pressure- Have your blood pressure checked once a year by your healthcare provider.  Normal blood pressure is 120/80  Weight- Have your body mass index (BMI) calculated to screen for obesity.  BMI is a measure of body fat based on height and weight. You can also calculate your own BMI at ViewBanking[employer]si.  Cholesterol- Have your cholesterol checked every year.  Diabetes- Have your blood sugar checked regularly if you have high blood pressure, high cholesterol, have a family history of diabetes or if you are overweight.  Screening for Colon Cancer- Colonoscopy starting at age 32.  Screening may begin sooner depending on your family history and other health conditions. Follow up colonoscopy as directed by your Gastroenterologist.  Screening for Prostate Cancer- Both blood work (PSA) and a rectal exam help screen for Prostate Cancer.  Screening begins at age 58 with African-American men and at age 41 with Caucasian men.  Screening may begin sooner depending on your family history.  Take these medicines  Aspirin- One aspirin daily can help prevent Heart disease and Stroke.  Flu shot- Every fall.  Tetanus- Every 10 years.  Zostavax- Once after the age of 70 to prevent Shingles.  Pneumonia shot- Once after the age of 85;  if you are younger than 14, ask your healthcare provider if you need a Pneumonia shot.  Take these steps  Don't smoke- If you do smoke, talk to your doctor about quitting.  For tips on how to quit, go to www.smokefree.gov or call 1-800-QUIT-NOW.  Be physically active- Exercise 5 days a week for at least 30 minutes.  If you are not already physically active start slow and gradually work up to 30 minutes of moderate physical activity.  Examples of moderate activity include walking briskly, mowing the yard, dancing, swimming, bicycling, etc.  Eat a healthy diet- Eat a variety of healthy food such as fruits, vegetables, low fat  milk, low fat cheese, yogurt, lean meant, poultry, fish, beans, tofu, etc. For more information go to www.thenutritionsource.org  Drink alcohol in moderation- Limit alcohol intake to less than two drinks a day. Never drink and drive.  Dentist- Brush and floss twice daily; visit your dentist twice a year.  Depression- Your emotional health is as important as your physical health. If you're feeling down, or losing interest in things you would normally enjoy please talk to your healthcare provider.  Eye exam- Visit your eye doctor every year.  Safe sex- If you may be exposed to a sexually transmitted infection, use a condom.  Seat belts- Seat belts can save your life; always wear one.  Smoke/Carbon Monoxide detectors- These detectors need to be installed on the appropriate level of your home.  Replace batteries at least once a year.  Skin cancer- When out in the sun, cover up and use sunscreen 15 SPF or higher.  Violence- If anyone is threatening you, please tell your healthcare provider.  Living Will/ Health care power of attorney- Speak with your healthcare provider and family.  Constipation, Adult Constipation is when a person has fewer bowel movements in a week than normal, has difficulty having a bowel movement, or has stools that are dry, hard, or larger than normal. Constipation may be caused by an underlying condition. It may become worse with age if a person takes certain medicines and does not take in enough fluids. Follow these instructions at home: Eating and drinking   Eat foods that have a lot of fiber, such as fresh fruits and vegetables, whole grains, and beans.  Limit foods that are high in fat, low in fiber, or overly processed, such as french fries, hamburgers, cookies, candies, and soda.  Drink enough fluid to keep your urine clear or pale yellow. General instructions  Exercise regularly or as told by your health care provider.  Go to the restroom when you have  the urge to go. Do not hold it in.  Take over-the-counter and prescription medicines only as told by your health care provider. These include any fiber supplements.  Practice pelvic floor retraining exercises, such as deep breathing while relaxing the lower abdomen and pelvic floor relaxation during bowel movements.  Watch your condition for any changes.  Keep all follow-up visits as told by your health care provider. This is important. Contact a health care provider if:  You have pain that gets worse.  You have a fever.  You do not have a bowel movement after 4 days.  You vomit.  You are not hungry.  You lose weight.  You are bleeding from the anus.  You have thin, pencil-like stools. Get help right away if:  You have a fever and your symptoms suddenly get worse.  You leak stool or have blood in your stool.  Your abdomen is bloated.  You have severe pain in your abdomen.  You feel dizzy or you faint. This information is not intended to replace advice given to you by your health care provider. Make sure you discuss any questions you have with your health care provider. Document Released: 04/14/2004 Document Revised: 02/04/2016 Document Reviewed: 01/05/2016 Elsevier Interactive Patient Education  2018 [employer].    IF you received an x-ray today, you will receive an invoice from [facility]. Please contact [facility] at [phone] with questions or concerns regarding your invoice.   IF you received labwork today, you will receive an invoice from [facility]. Please contact [facility] at [phone] with questions or concerns regarding your invoice.   Our billing staff will not be able to assist you with questions regarding bills from these companies.  You will be contacted with the lab results as soon as they are available. The fastest way to get your results is to activate your My Chart account. Instructions are located on the last page of this  paperwork. If you have not heard from [place] regarding the results in 2 weeks, please contact this office.       I personally performed the services described in this documentation, which was scribed in my presence. The recorded information has been reviewed and considered for accuracy and completeness, addended by me as needed, and agree with information above.  Signed,   Merri Ray, MD [facility] [place].  11/26/17 9:48 AM

## 2017-11-27 LAB — CBC
Hematocrit: 47.6 % (ref 37.5–51.0)
Hemoglobin: 16.7 g/dL (ref 13.0–17.7)
MCH: 30.4 pg (ref 26.6–33.0)
MCHC: 35.1 g/dL (ref 31.5–35.7)
MCV: 87 fL (ref 79–97)
Platelets: 159 10*3/uL (ref 150–379)
RBC: 5.5 x10E6/uL (ref 4.14–5.80)
RDW: 13.3 % (ref 12.3–15.4)
WBC: 7.6 10*3/uL (ref 3.4–10.8)

## 2017-11-27 LAB — COMPREHENSIVE METABOLIC PANEL
ALT: 32 IU/L (ref 0–44)
AST: 39 IU/L (ref 0–40)
Albumin/Globulin Ratio: 2 (ref 1.2–2.2)
Albumin: 4.7 g/dL (ref 3.6–4.8)
Alkaline Phosphatase: 58 IU/L (ref 39–117)
BUN/Creatinine Ratio: 11 (ref 10–24)
BUN: 12 mg/dL (ref 8–27)
Bilirubin Total: 0.6 mg/dL (ref 0.0–1.2)
CO2: 22 mmol/L (ref 20–29)
Calcium: 9.4 mg/dL (ref 8.6–10.2)
Chloride: 105 mmol/L (ref 96–106)
Creatinine, Ser: 1.1 mg/dL (ref 0.76–1.27)
GFR calc Af Amer: 82 mL/min/{1.73_m2} (ref >59)
GFR calc non Af Amer: 71 mL/min/{1.73_m2} (ref >59)
Globulin, Total: 2.3 g/dL (ref 1.5–4.5)
Glucose: 77 mg/dL (ref 65–99)
Potassium: 4.1 mmol/L (ref 3.5–5.2)
Sodium: 144 mmol/L (ref 134–144)
Total Protein: 7 g/dL (ref 6.0–8.5)

## 2017-11-27 LAB — LIPID PANEL
Chol/HDL Ratio: 3.6 ratio (ref 0.0–5.0)
Cholesterol, Total: 146 mg/dL (ref 100–199)
HDL: 41 mg/dL (ref >39)
LDL Calculated: 88 mg/dL (ref 0–99)
Triglycerides: 83 mg/dL (ref 0–149)
VLDL Cholesterol Cal: 17 mg/dL (ref 5–40)

## 2017-11-27 LAB — VITAMIN B12: Vitamin B-12: 1143 pg/mL (ref 232–1245)

## 2017-11-27 LAB — MAGNESIUM: Magnesium: 2.2 mg/dL (ref 1.6–2.3)

## 2017-11-27 LAB — TSH: TSH: 1.43 u[IU]/mL (ref 0.450–4.500)

## 2017-11-27 LAB — VITAMIN D 25 HYDROXY (VIT D DEFICIENCY, FRACTURES): Vit D, 25-Hydroxy: 49.9 ng/mL (ref 30.0–100.0)

## 2017-12-30 NOTE — Progress Notes (Signed)
Nicholas West Date of Birth: 06-27-1956 Medical Record #[id number]  History of Present Illness: Nicholas West is seen for follow up of atrial fibrillation.   He has a history of paroxysmal atrial fibrillation. He has been managed with rate control with diltiazem. He has a  Mali vascular score of 0. He is taking a baby aspirin daily.   On his prior visit he complained of nausea and tingling in his arms with exercise. We arranged a POET. On arrival he was in AFib with rate 98. He exercised for 9 minutes on the Bruce protocol without symptoms or ST changes. He converted to NSR then went back into atrial flutter with RVR. His Cardizem was increased to 240 mg daily.    Since then he has done well. [facility] Denies any recurrent nausea with exertion. Notes infrequent symptoms with AFib flutter and he tolerates this well. Tends to note this more in the am. No chest pain or dyspnea. No dizziness.  Current Outpatient Medications on File Prior to Visit  Medication Sig Dispense Refill  . aspirin 81 MG tablet Take 81 mg by mouth daily.    . calcium carbonate (OS-CAL - DOSED IN MG OF ELEMENTAL CALCIUM) 1250 MG tablet Take 2 tablets by mouth 2 (two) times daily with a meal.     . cholecalciferol (VITAMIN D) 1000 UNITS tablet Take 1,000 Units by mouth 2 (two) times daily.    Marland Kitchen diltiazem (CARDIZEM CD) 240 MG 24 hr capsule Take 1 capsule (240 mg total) by mouth daily. 90 capsule 3  . Docusate Sodium (COLACE PO) Take 1 tablet by mouth as directed.    . Multiple Vitamin (MULTIVITAMIN) tablet Take 1 tablet by mouth daily.    . Omega-3 Fatty Acids (OMEGA 3 PO) Take 2 capsules by mouth daily.     Marland Kitchen omeprazole (PRILOSEC) 20 MG capsule Take 1 capsule (20 mg total) by mouth daily. 30 capsule 11  . Probiotic Product (PROBIOTIC-10 PO) Take 1 tablet by mouth daily.    . sildenafil (REVATIO) 20 MG tablet Take by mouth. Take 1 to 5 tabs by mouth as needed for erectile dysfunction    . tacrolimus  (PROTOPIC) 0.1 % ointment Apply 1 application topically daily.     Marland Kitchen testosterone cypionate (DEPOTESTOSTERONE CYPIONATE) 200 MG/ML injection Inject into the muscle once a week.      No current facility-administered medications on file prior to visit.     Allergies  Allergen Reactions  . Penicillins     rash    Past Medical History:  Diagnosis Date  . Abdominal pain   . Atrial fibrillation ([place])   . Chest pain    negative stress echo in September 2013  . Diverticular disease   . Diverticulitis   . GERD (gastroesophageal reflux disease)     Past Surgical History:  Procedure Laterality Date  . COLON SURGERY    . PARTIAL COLECTOMY  11/11    Social History   Tobacco Use  Smoking Status Never Smoker  Smokeless Tobacco Never Used    Social History   Substance and Sexual Activity  Alcohol Use No    Family History  Problem Relation Age of Onset  . Cancer Mother   . Alzheimer's disease Father   . Colon polyps Father   . GER disease Father   . Heart disease Paternal Grandmother   . Cancer Paternal Grandfather     Review of Systems: The review of systems  is per the HPI.  All other systems were reviewed and are negative.  Physical Exam: BP 112/84   Pulse 60   Ht 6\' 1"  (1.854 m)   Wt 184 lb 9.6 oz (83.7 kg)   BMI 24.36 kg/m  GENERAL:  Well appearing WM in NAD HEENT:  PERRL, EOMI, sclera are clear. Oropharynx is clear. NECK:  No jugular venous distention, carotid upstroke brisk and symmetric, no bruits, no thyromegaly or adenopathy LUNGS:  Clear to auscultation bilaterally CHEST:  Unremarkable HEART:  RRR,  PMI not displaced or sustained,S1 and S2 within normal limits, no S3, no S4: no clicks, no rubs, no murmurs ABD:  Soft, nontender. BS +, no masses or bruits. No hepatomegaly, no splenomegaly EXT:  2 + pulses throughout, no edema, no cyanosis no clubbing SKIN:  Warm and dry.  No rashes NEURO:  Alert and oriented x 3. Cranial nerves II through XII  intact. PSYCH:  Cognitively intact      LABORATORY DATA:   Lab Results  Component Value Date   WBC 7.6 11/26/2017   HGB 16.7 11/26/2017   HCT 47.6 11/26/2017   PLT 159 11/26/2017   GLUCOSE 77 11/26/2017   CHOL 146 11/26/2017   TRIG 83 11/26/2017   HDL 41 11/26/2017   LDLCALC 88 11/26/2017   ALT 32 11/26/2017   AST 39 11/26/2017   NA 144 11/26/2017   K 4.1 11/26/2017   CL 105 11/26/2017   CREATININE 1.10 11/26/2017   BUN 12 11/26/2017   CO2 22 11/26/2017   TSH 1.430 11/26/2017   PSA 1.38 10/29/2015   INR 1.10 06/04/2013   Ecg today shows NSR rate 60. Normal Ecg. I have personally reviewed and interpreted this study.'  ETT 08/31/17: Study Highlights    Blood pressure demonstrated a blunted response to exercise.  Patient started the test in atrial fibrillation and converted to sinus rhythm and subsequently atrial flutter with rapid ventricular response with exercise.  There was no ST segment deviation noted during stress.  Negative, adequate stress test.     Assessment / Plan: 1. Paroxysmal atrial fibrillation and fluttrer. Patient is mildly symptomatic. He has a  Mali vascular score of 0.  Continue baby aspirin and diltiazem daily. We discussed that if he should develop more Afib that affects his quality of life then we could consider AAD therapy or ablation. We also reviewed risk of CAD and given low [place] score of 0 that anticoagulation is not required.   2.  ETT negative for ischemia.  Follow up in 6 months.

## 2018-01-02 ENCOUNTER — Encounter: Payer: Self-pay | Admitting: Cardiology

## 2018-01-02 ENCOUNTER — Ambulatory Visit (INDEPENDENT_AMBULATORY_CARE_PROVIDER_SITE_OTHER): Payer: BLUE CROSS/BLUE SHIELD | Admitting: Cardiology

## 2018-01-02 VITALS — BP 112/84 | HR 60 | Ht 73.0 in | Wt 184.6 lb

## 2018-01-02 DIAGNOSIS — I480 Paroxysmal atrial fibrillation: Secondary | ICD-10-CM

## 2018-01-02 DIAGNOSIS — E7800 Pure hypercholesterolemia, unspecified: Secondary | ICD-10-CM

## 2018-01-02 NOTE — Patient Instructions (Addendum)
Continue your current therapy  I will see you in 6 months.   

## 2018-03-21 DIAGNOSIS — E291 Testicular hypofunction: Secondary | ICD-10-CM | POA: Diagnosis not present

## 2018-03-21 DIAGNOSIS — R948 Abnormal results of function studies of other organs and systems: Secondary | ICD-10-CM | POA: Diagnosis not present

## 2018-03-21 DIAGNOSIS — R978 Other abnormal tumor markers: Secondary | ICD-10-CM | POA: Diagnosis not present

## 2018-03-26 DIAGNOSIS — H903 Sensorineural hearing loss, bilateral: Secondary | ICD-10-CM | POA: Diagnosis not present

## 2018-03-27 DIAGNOSIS — N5201 Erectile dysfunction due to arterial insufficiency: Secondary | ICD-10-CM | POA: Diagnosis not present

## 2018-03-27 DIAGNOSIS — E291 Testicular hypofunction: Secondary | ICD-10-CM | POA: Diagnosis not present

## 2018-04-29 DIAGNOSIS — K5900 Constipation, unspecified: Secondary | ICD-10-CM | POA: Diagnosis not present

## 2018-04-29 DIAGNOSIS — R194 Change in bowel habit: Secondary | ICD-10-CM | POA: Diagnosis not present

## 2018-07-15 NOTE — Progress Notes (Signed)
[place] Date of Birth: [birth year] Medical Record #[id number]  History of Present Illness: Nicholas West is seen for follow up of atrial fibrillation.   He has a history of paroxysmal atrial fibrillation. He has been managed with rate control with diltiazem. He has a  Mali vascular score of 0. He is taking a baby aspirin daily.   On his prior visit he complained of nausea and tingling in his arms with exercise. We arranged a POET. On arrival he was in AFib with rate 98. He exercised for 9 minutes on the Bruce protocol without symptoms or ST changes. He converted to NSR then went back into atrial flutter with RVR. His Cardizem was increased to 240 mg daily.    On follow today he reports he is doing very well. He exercises 3x/wk without limitation. No chest pain, dizziness, chest pain. Notes some arrhythmia usually in the early morning but it doesn't last long or cause any limitation.   Current Outpatient Medications on File Prior to Visit  Medication Sig Dispense Refill  . aspirin 81 MG tablet Take 81 mg by mouth daily.    . calcium carbonate (OS-CAL - DOSED IN MG OF ELEMENTAL CALCIUM) 1250 MG tablet Take 2 tablets by mouth 2 (two) times daily with a meal.     . cholecalciferol (VITAMIN D) 1000 UNITS tablet Take 1,000 Units by mouth 2 (two) times daily.    Marland Kitchen diltiazem (CARDIZEM CD) 240 MG 24 hr capsule Take 1 capsule (240 mg total) by mouth daily. 90 capsule 3  . Docusate Sodium (COLACE PO) Take 1 tablet by mouth as directed.    . Multiple Vitamin (MULTIVITAMIN) tablet Take 1 tablet by mouth daily.    . Omega-3 Fatty Acids (OMEGA 3 PO) Take 2 capsules by mouth daily.     Marland Kitchen omeprazole (PRILOSEC) 20 MG capsule Take 1 capsule (20 mg total) by mouth daily. 30 capsule 11  . Probiotic Product (PROBIOTIC-10 PO) Take 1 tablet by mouth daily.    . sildenafil (REVATIO) 20 MG tablet Take by mouth. Take 1 to 5 tabs by mouth as needed for erectile dysfunction    . tacrolimus (PROTOPIC) 0.1 % ointment  Apply 1 application topically daily.     Marland Kitchen testosterone cypionate (DEPOTESTOSTERONE CYPIONATE) 200 MG/ML injection Inject into the muscle once a week.      No current facility-administered medications on file prior to visit.     Allergies  Allergen Reactions  . Penicillins     rash    Past Medical History:  Diagnosis Date  . Abdominal pain   . Atrial fibrillation ([place])   . Chest pain    negative stress echo in September 2013  . Diverticular disease   . Diverticulitis   . GERD (gastroesophageal reflux disease)     Past Surgical History:  Procedure Laterality Date  . COLON SURGERY    . PARTIAL COLECTOMY  11/11    Social History   Tobacco Use  Smoking Status Never Smoker  Smokeless Tobacco Never Used    Social History   Substance and Sexual Activity  Alcohol Use No    Family History  Problem Relation Age of Onset  . Cancer Mother   . Alzheimer's disease Father   . Colon polyps Father   . GER disease Father   . Heart disease Paternal Grandmother   . Cancer Paternal Grandfather     Review of Systems: The review of systems is per the HPI.  All other  systems were reviewed and are negative.  Physical Exam: BP 124/76   Pulse (!) 56   Ht 6' (1.829 m)   Wt 185 lb (83.9 kg)   BMI 25.09 kg/m  GENERAL:  Well appearing WM in NAD HEENT:  PERRL, EOMI, sclera are clear. Oropharynx is clear. NECK:  No jugular venous distention, carotid upstroke brisk and symmetric, no bruits, no thyromegaly or adenopathy LUNGS:  Clear to auscultation bilaterally CHEST:  Unremarkable HEART:  RRR,  PMI not displaced or sustained,S1 and S2 within normal limits, no S3, no S4: no clicks, no rubs, no murmurs ABD:  Soft, nontender. BS +, no masses or bruits. No hepatomegaly, no splenomegaly EXT:  2 + pulses throughout, no edema, no cyanosis no clubbing SKIN:  Warm and dry.  No rashes NEURO:  Alert and oriented x 3. Cranial nerves II through XII intact. PSYCH:  Cognitively  intact        LABORATORY DATA:   Lab Results  Component Value Date   WBC 7.6 11/26/2017   HGB 16.7 11/26/2017   HCT 47.6 11/26/2017   PLT 159 11/26/2017   GLUCOSE 77 11/26/2017   CHOL 146 11/26/2017   TRIG 83 11/26/2017   HDL 41 11/26/2017   LDLCALC 88 11/26/2017   ALT 32 11/26/2017   AST 39 11/26/2017   NA 144 11/26/2017   K 4.1 11/26/2017   CL 105 11/26/2017   CREATININE 1.10 11/26/2017   BUN 12 11/26/2017   CO2 22 11/26/2017   TSH 1.430 11/26/2017   PSA 1.38 10/29/2015   INR 1.10 06/04/2013    ETT 08/31/17: Study Highlights    Blood pressure demonstrated a blunted response to exercise.  Patient started the test in atrial fibrillation and converted to sinus rhythm and subsequently atrial flutter with rapid ventricular response with exercise.  There was no ST segment deviation noted during stress.  Negative, adequate stress test.     Assessment / Plan: 1. Paroxysmal atrial fibrillation and fluttrer. Patient is minimally symptomatic. He has a  Mali vascular score of 0.  Continue baby aspirin and diltiazem daily. We discussed that if he should develop more Afib that affects his quality of life then we could consider AAD therapy or ablation. We also reviewed risk of CAD and given low [place] score of 0 that anticoagulation is not required.    Follow up in 6 months.

## 2018-07-16 ENCOUNTER — Ambulatory Visit (INDEPENDENT_AMBULATORY_CARE_PROVIDER_SITE_OTHER): Payer: BLUE CROSS/BLUE SHIELD | Admitting: Cardiology

## 2018-07-16 ENCOUNTER — Encounter: Payer: Self-pay | Admitting: Cardiology

## 2018-07-16 VITALS — BP 124/76 | HR 56 | Ht 72.0 in | Wt 185.0 lb

## 2018-07-16 DIAGNOSIS — E7800 Pure hypercholesterolemia, unspecified: Secondary | ICD-10-CM

## 2018-07-16 DIAGNOSIS — I480 Paroxysmal atrial fibrillation: Secondary | ICD-10-CM

## 2018-08-29 ENCOUNTER — Other Ambulatory Visit: Payer: Self-pay | Admitting: *Deleted

## 2018-08-29 MED ORDER — DILTIAZEM HCL ER COATED BEADS 240 MG PO CP24: 240.0000 mg | ORAL_CAPSULE | Freq: Every day | ORAL | 3 refills | Status: DC

## 2018-09-24 ENCOUNTER — Ambulatory Visit (INDEPENDENT_AMBULATORY_CARE_PROVIDER_SITE_OTHER): Payer: BLUE CROSS/BLUE SHIELD | Admitting: Family Medicine

## 2018-09-24 ENCOUNTER — Other Ambulatory Visit: Payer: Self-pay

## 2018-09-24 ENCOUNTER — Encounter: Payer: Self-pay | Admitting: Family Medicine

## 2018-09-24 VITALS — BP 144/76 | HR 65 | Temp 98.9°F | Resp 18 | Ht 72.0 in | Wt 181.8 lb

## 2018-09-24 DIAGNOSIS — H6122 Impacted cerumen, left ear: Secondary | ICD-10-CM

## 2018-09-24 DIAGNOSIS — J019 Acute sinusitis, unspecified: Secondary | ICD-10-CM | POA: Diagnosis not present

## 2018-09-24 DIAGNOSIS — J029 Acute pharyngitis, unspecified: Secondary | ICD-10-CM

## 2018-09-24 DIAGNOSIS — H6982 Other specified disorders of Eustachian tube, left ear: Secondary | ICD-10-CM | POA: Diagnosis not present

## 2018-09-24 LAB — POCT RAPID STREP A (OFFICE): Rapid Strep A Screen: NEGATIVE

## 2018-09-24 MED ORDER — CEFDINIR 300 MG PO CAPS: 600.0000 mg | ORAL_CAPSULE | Freq: Every day | ORAL | 0 refills | Status: DC

## 2018-09-24 NOTE — Progress Notes (Signed)
Patient ID: Nicholas Patten., male    DOB: 09-Jun-1956  Age: 63 y.o. MRN: [id number]  Chief Complaint  Patient presents with  . Sore Throat    week started with chills   . 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  It was initially a bad sore throat with chills.  The sore throat persists.  He has had postnasal drainage.  He has had stuffiness and pain in his ears.  The left ear been worse.  He does not smoke.  He has a hard time hearing clients anyway because of his deafness.  He does wear hearing aids.  He has not been running a fever.  He did have chills however.  Current allergies, medications, problem list, past/family and social histories reviewed.  Objective:  BP (!) 144/76   Pulse 65   Temp 98.9 F (37.2 C) (Oral)   Resp 18   Ht 6' (1.829 m)   Wt 181 lb 12.8 oz (82.5 kg)   SpO2 95%   BMI 24.66 kg/m   No major acute distress.  TMs normal on the right.  Left ear had a big plug of wax.  This was irrigated out and the drum looks satisfactory.  His throat has some thick mucus coming down right behind the uvula.  Sinuses are nontender to percussion.  Neck supple without nodes.  Chest is clear to auscultation.  Heart regular without murmur.  Assessment & Plan:   Assessment: 1. Acute non-recurrent sinusitis, unspecified location   2. Sore throat   3. Dysfunction of left eustachian tube   4. Impacted cerumen of left ear       Plan: Instructed in use of Flonase.  Also explained cross allergenicity of cephalosporins and penicillins but with his history of only having a rash from the penicillin I am comfortable with giving him this.  Orders Placed This Encounter  Procedures  . POCT rapid strep A    Meds ordered this encounter  Medications  . cefdinir (OMNICEF) 300 MG capsule    Sig: Take 2 capsules (600 mg total) by mouth daily.   Dispense:  20 capsule    Refill:  0         Patient Instructions    For the sinus infection causing the postnasal drainage and sore throat take Omnicef 300 mg twice daily.  (Cefdinir)  Use Flonase (fluticasone eyes) nose spray 2 sprays each nostril twice daily for 3 or 4 days, then decrease to once daily.  Drink plenty of fluids which helps keep the mucus thin.  I recommend that you consider flushing out your ears with some Debrox every 6 months or so just to keep wax from building up on you.  Get rechecked as needed.   If you have lab work done today you will be contacted with your lab results within the next 2 weeks.  If you have not heard from [place] then please contact us. The fastest way to get your results is to register for My Chart.   IF you received an x-ray today, you will receive an invoice from [facility]. Please contact [facility] at [phone] with questions or concerns regarding your invoice.   IF you received labwork today, you will receive an invoice from [facility]. Please contact [facility] at [phone] with questions or concerns  regarding your invoice.   Our billing staff will not be able to assist you with questions regarding bills from these companies.  You will be contacted with the lab results as soon as they are available. The fastest way to get your results is to activate your My Chart account. Instructions are located on the last page of this paperwork. If you have not heard from [place] regarding the results in 2 weeks, please contact this office.        Return if symptoms worsen or fail to improve.   Ruben Reason, MD 09/24/2018

## 2018-09-24 NOTE — Patient Instructions (Addendum)
  For the sinus infection causing the postnasal drainage and sore throat take Omnicef 300 mg twice daily.  (Cefdinir)  Use Flonase (fluticasone eyes) nose spray 2 sprays each nostril twice daily for 3 or 4 days, then decrease to once daily.  Drink plenty of fluids which helps keep the mucus thin.  I recommend that you consider flushing out your ears with some Debrox every 6 months or so just to keep wax from building up on you.  Get rechecked as needed.   If you have lab work done today you will be contacted with your lab results within the next 2 weeks.  If you have not heard from [place] then please contact us. The fastest way to get your results is to register for My Chart.   IF you received an x-ray today, you will receive an invoice from [facility]. Please contact [facility] at [phone] with questions or concerns regarding your invoice.   IF you received labwork today, you will receive an invoice from [facility]. Please contact [facility] at [phone] with questions or concerns regarding your invoice.   Our billing staff will not be able to assist you with questions regarding bills from these companies.  You will be contacted with the lab results as soon as they are available. The fastest way to get your results is to activate your My Chart account. Instructions are located on the last page of this paperwork. If you have not heard from [place] regarding the results in 2 weeks, please contact this office.

## 2018-09-25 ENCOUNTER — Ambulatory Visit: Payer: BLUE CROSS/BLUE SHIELD | Admitting: Family Medicine

## 2018-12-02 ENCOUNTER — Encounter: Payer: BLUE CROSS/BLUE SHIELD | Admitting: Family Medicine

## 2019-01-10 ENCOUNTER — Encounter: Payer: BLUE CROSS/BLUE SHIELD | Admitting: Family Medicine

## 2019-01-17 ENCOUNTER — Ambulatory Visit: Payer: Self-pay | Admitting: Cardiology

## 2019-02-20 DIAGNOSIS — H04123 Dry eye syndrome of bilateral lacrimal glands: Secondary | ICD-10-CM | POA: Diagnosis not present

## 2019-02-20 DIAGNOSIS — H5213 Myopia, bilateral: Secondary | ICD-10-CM | POA: Diagnosis not present

## 2019-02-20 DIAGNOSIS — H2513 Age-related nuclear cataract, bilateral: Secondary | ICD-10-CM | POA: Diagnosis not present

## 2019-04-24 NOTE — Progress Notes (Signed)
[place] Date of Birth: 1956/07/24 Medical Record [id number]  History of Present Illness: Nicholas West is seen for follow up of atrial fibrillation.   He has a history of paroxysmal atrial fibrillation. He has been managed with rate control with diltiazem. He has a  Mali vascular score of 0. He is taking a baby aspirin daily.   On his prior visit he complained of nausea and tingling in his arms with exercise. We arranged a POET. On arrival he was in AFib with rate 98. He exercised for 9 minutes on the Bruce protocol without symptoms or ST changes. He converted to NSR then went back into atrial flutter with RVR. His Cardizem was increased to 240 mg daily.    On follow today he reports he is doing very well. He is working mostly from home. Does some walking on treadmill. No chest pain, dizziness, chest pain. Notes some arrhythmia usually in the early morning but it doesn't last long or cause any limitation.   Current Outpatient Medications on File Prior to Visit  Medication Sig Dispense Refill  . aspirin 81 MG tablet Take 81 mg by mouth daily.    . calcium carbonate (OS-CAL - DOSED IN MG OF ELEMENTAL CALCIUM) 1250 MG tablet Take 2 tablets by mouth 2 (two) times daily with a meal.     . cholecalciferol (VITAMIN D) 1000 UNITS tablet Take 1,000 Units by mouth 2 (two) times daily.    Marland Kitchen diltiazem (CARDIZEM CD) 240 MG 24 hr capsule Take 1 capsule (240 mg total) by mouth daily. 90 capsule 3  . Multiple Vitamin (MULTIVITAMIN) tablet Take 1 tablet by mouth daily.    . Omega-3 Fatty Acids (OMEGA 3 PO) Take 2 capsules by mouth daily.     Marland Kitchen omeprazole (PRILOSEC) 20 MG capsule Take 1 capsule (20 mg total) by mouth daily. 30 capsule 11  . Probiotic Product (PROBIOTIC-10 PO) Take 1 tablet by mouth daily.    . sildenafil (REVATIO) 20 MG tablet Take by mouth. Take 1 to 5 tabs by mouth as needed for erectile dysfunction    . tacrolimus (PROTOPIC) 0.1 % ointment Apply 1 application topically daily.     Marland Kitchen  testosterone cypionate (DEPOTESTOSTERONE CYPIONATE) 200 MG/ML injection Inject into the muscle once a week.     Mariane Baumgarten Sodium (COLACE PO) Take 1 tablet by mouth as directed.     No current facility-administered medications on file prior to visit.     Allergies  Allergen Reactions  . Penicillins     rash    Past Medical History:  Diagnosis Date  . Abdominal pain   . Atrial fibrillation ([place])   . Chest pain    negative stress echo in September 2013  . Diverticular disease   . Diverticulitis   . GERD (gastroesophageal reflux disease)     Past Surgical History:  Procedure Laterality Date  . COLON SURGERY    . PARTIAL COLECTOMY  11/11    Social History   Tobacco Use  Smoking Status Never Smoker  Smokeless Tobacco Never Used    Social History   Substance and Sexual Activity  Alcohol Use No    Family History  Problem Relation Age of Onset  . Cancer Mother   . Alzheimer's disease Father   . Colon polyps Father   . GER disease Father   . Heart disease Paternal Grandmother   . Cancer Paternal Grandfather     Review of Systems: The review of systems is  per the HPI.  All other systems were reviewed and are negative.  Physical Exam: BP 124/81   Pulse 65   Temp (!) 97 F (36.1 C)   Ht 6\' 1"  (1.854 m)   Wt 177 lb 9.6 oz (80.6 kg)   SpO2 99%   BMI 23.43 kg/m  GENERAL:  Well appearing WM in NAD HEENT:  PERRL, EOMI, sclera are clear. Oropharynx is clear. NECK:  No jugular venous distention, carotid upstroke brisk and symmetric, no bruits, no thyromegaly or adenopathy LUNGS:  Clear to auscultation bilaterally CHEST:  Unremarkable HEART:  RRR,  PMI not displaced or sustained,S1 and S2 within normal limits, no S3, no S4: no clicks, no rubs, no murmurs ABD:  Soft, nontender. BS +, no masses or bruits. No hepatomegaly, no splenomegaly EXT:  2 + pulses throughout, no edema, no cyanosis no clubbing SKIN:  Warm and dry.  No rashes NEURO:  Alert and oriented x 3.  Cranial nerves II through XII intact. PSYCH:  Cognitively intact  LABORATORY DATA:   Lab Results  Component Value Date   WBC 7.6 11/26/2017   HGB 16.7 11/26/2017   HCT 47.6 11/26/2017   PLT 159 11/26/2017   GLUCOSE 77 11/26/2017   CHOL 146 11/26/2017   TRIG 83 11/26/2017   HDL 41 11/26/2017   LDLCALC 88 11/26/2017   ALT 32 11/26/2017   AST 39 11/26/2017   NA 144 11/26/2017   K 4.1 11/26/2017   CL 105 11/26/2017   CREATININE 1.10 11/26/2017   BUN 12 11/26/2017   CO2 22 11/26/2017   TSH 1.430 11/26/2017   PSA 1.38 10/29/2015   INR 1.10 06/04/2013    Ecg today shows NSR rate 62. Normal. I have personally reviewed and interpreted this study.  ETT 08/31/17: Study Highlights    Blood pressure demonstrated a blunted response to exercise.  Patient started the test in atrial fibrillation and converted to sinus rhythm and subsequently atrial flutter with rapid ventricular response with exercise.  There was no ST segment deviation noted during stress.  Negative, adequate stress test.     Assessment / Plan: 1. Paroxysmal atrial fibrillation and fluttrer. Patient is minimally symptomatic. He has a  Mali vascular score of 0.  Continue baby aspirin and diltiazem daily.    Follow up in one year

## 2019-04-28 ENCOUNTER — Ambulatory Visit (INDEPENDENT_AMBULATORY_CARE_PROVIDER_SITE_OTHER): Payer: BC Managed Care – PPO | Admitting: Cardiology

## 2019-04-28 ENCOUNTER — Encounter: Payer: Self-pay | Admitting: Cardiology

## 2019-04-28 ENCOUNTER — Other Ambulatory Visit: Payer: Self-pay

## 2019-04-28 VITALS — BP 124/81 | HR 65 | Temp 97.0°F | Ht 73.0 in | Wt 177.6 lb

## 2019-04-28 DIAGNOSIS — Z23 Encounter for immunization: Secondary | ICD-10-CM | POA: Diagnosis not present

## 2019-04-28 DIAGNOSIS — I480 Paroxysmal atrial fibrillation: Secondary | ICD-10-CM | POA: Diagnosis not present

## 2019-04-30 DIAGNOSIS — Z125 Encounter for screening for malignant neoplasm of prostate: Secondary | ICD-10-CM | POA: Diagnosis not present

## 2019-04-30 DIAGNOSIS — E291 Testicular hypofunction: Secondary | ICD-10-CM | POA: Diagnosis not present

## 2019-05-07 DIAGNOSIS — E291 Testicular hypofunction: Secondary | ICD-10-CM | POA: Diagnosis not present

## 2019-05-07 DIAGNOSIS — N5201 Erectile dysfunction due to arterial insufficiency: Secondary | ICD-10-CM | POA: Diagnosis not present

## 2019-05-22 ENCOUNTER — Other Ambulatory Visit: Payer: Self-pay

## 2019-05-22 ENCOUNTER — Encounter: Payer: Self-pay | Admitting: Family Medicine

## 2019-05-22 ENCOUNTER — Ambulatory Visit (INDEPENDENT_AMBULATORY_CARE_PROVIDER_SITE_OTHER): Payer: BC Managed Care – PPO | Admitting: Family Medicine

## 2019-05-22 VITALS — BP 105/66 | HR 58 | Temp 98.9°F | Wt 172.6 lb

## 2019-05-22 DIAGNOSIS — Z0001 Encounter for general adult medical examination with abnormal findings: Secondary | ICD-10-CM

## 2019-05-22 DIAGNOSIS — Z131 Encounter for screening for diabetes mellitus: Secondary | ICD-10-CM | POA: Diagnosis not present

## 2019-05-22 DIAGNOSIS — Z1329 Encounter for screening for other suspected endocrine disorder: Secondary | ICD-10-CM

## 2019-05-22 DIAGNOSIS — Z130 Encounter for screening for diseases of the blood and blood-forming organs and certain disorders involving the immune mechanism: Secondary | ICD-10-CM | POA: Diagnosis not present

## 2019-05-22 DIAGNOSIS — Z0000 Encounter for general adult medical examination without abnormal findings: Secondary | ICD-10-CM

## 2019-05-22 DIAGNOSIS — Z23 Encounter for immunization: Secondary | ICD-10-CM

## 2019-05-22 DIAGNOSIS — Z13220 Encounter for screening for lipoid disorders: Secondary | ICD-10-CM | POA: Diagnosis not present

## 2019-05-22 DIAGNOSIS — I4891 Unspecified atrial fibrillation: Secondary | ICD-10-CM | POA: Diagnosis not present

## 2019-05-22 DIAGNOSIS — L720 Epidermal cyst: Secondary | ICD-10-CM

## 2019-05-22 MED ORDER — SHINGRIX 50 MCG/0.5ML IM SUSR: 0.5000 mL | Freq: Once | INTRAMUSCULAR | 1 refills | Status: AC

## 2019-05-22 NOTE — Patient Instructions (Addendum)
Area on abdomen does appear to be a sebaceous or epidermal cyst.  I did express some of the sebaceous material in the office but still has significant amount.  May need to have that area completely excised, so will refer you to dermatology.  Watch for any increased redness, soreness or new discharge from area and if that occurs let me know.  Keep area clean with soap and water and covered if needed.  No other med changes at this time.  Thank you for coming in today and take care.     Keeping you healthy  Get these tests  Blood pressure- Have your blood pressure checked once a year by your healthcare provider.  Normal blood pressure is 120/80  Weight- Have your body mass index (BMI) calculated to screen for obesity.  BMI is a measure of body fat based on height and weight. You can also calculate your own BMI at [email or web address].  Cholesterol- Have your cholesterol checked every year.  Diabetes- Have your blood sugar checked regularly if you have high blood pressure, high cholesterol, have a family history of diabetes or if you are overweight.  Screening for Colon Cancer- Colonoscopy starting at age 45.  Screening may begin sooner depending on your family history and other health conditions. Follow up colonoscopy as directed by your Gastroenterologist.  Screening for Prostate Cancer- Both blood work (PSA) and a rectal exam help screen for Prostate Cancer.  Screening begins at age 44 with African-American men and at age 47 with Caucasian men.  Screening may begin sooner depending on your family history.  Take these medicines  Aspirin- One aspirin daily can help prevent Heart disease and Stroke.  Flu shot- Every fall.  Tetanus- Every 10 years.  Zostavax- Once after the age of 97 to prevent Shingles.  Pneumonia shot- Once after the age of 83; if you are younger than 7, ask your healthcare provider if you need a Pneumonia shot.  Take these steps  Don't smoke- If you do smoke,  talk to your doctor about quitting.  For tips on how to quit, go to www.smokefree.gov or call 1-800-QUIT-NOW.  Be physically active- Exercise 5 days a week for at least 30 minutes.  If you are not already physically active start slow and gradually work up to 30 minutes of moderate physical activity.  Examples of moderate activity include walking briskly, mowing the yard, dancing, swimming, bicycling, etc.  Eat a healthy diet- Eat a variety of healthy food such as fruits, vegetables, low fat milk, low fat cheese, yogurt, lean meant, poultry, fish, beans, tofu, etc. For more information go to www.thenutritionsource.org  Drink alcohol in moderation- Limit alcohol intake to less than two drinks a day. Never drink and drive.  Dentist- Brush and floss twice daily; visit your dentist twice a year.  Depression- Your emotional health is as important as your physical health. If you're feeling down, or losing interest in things you would normally enjoy please talk to your healthcare provider.  Eye exam- Visit your eye doctor every year.  Safe sex- If you may be exposed to a sexually transmitted infection, use a condom.  Seat belts- Seat belts can save your life; always wear one.  Smoke/Carbon Monoxide detectors- These detectors need to be installed on the appropriate level of your home.  Replace batteries at least once a year.  Skin cancer- When out in the sun, cover up and use sunscreen 15 SPF or higher.  Violence- If anyone is threatening you, please tell  your healthcare provider.  Living Will/ Health care power of attorney- Speak with your healthcare provider and family.   Epidermal Cyst  An epidermal cyst is a sac made of skin tissue. The sac contains a substance called keratin. Keratin is a protein that is normally secreted through the hair follicles. When keratin becomes trapped in the top layer of skin (epidermis), it can form an epidermal cyst. Epidermal cysts can be found anywhere on your  body. These cysts are usually harmless (benign), and they may not cause symptoms unless they become infected. What are the causes? This condition may be caused by:  A blocked hair follicle.  A hair that curls and re-enters the skin instead of growing straight out of the skin (ingrown hair).  A blocked pore.  Irritated skin.  An injury to the skin.  Certain conditions that are passed along from parent to child (inherited).  Human papillomavirus (HPV).  Long-term (chronic) sun damage to the skin. What increases the risk? The following factors may make you more likely to develop an epidermal cyst:  Having acne.  Being overweight.  Being 11-44 years old. What are the signs or symptoms? The only symptom of this condition may be a small, painless lump underneath the skin. When an epidermal cyst ruptures, it may become infected. Symptoms may include:  Redness.  Inflammation.  Tenderness.  Warmth.  Fever.  Keratin draining from the cyst. Keratin is grayish-white, bad-smelling substance.  Pus draining from the cyst. How is this diagnosed? This condition is diagnosed with a physical exam.  In some cases, you may have a sample of tissue (biopsy) taken from your cyst to be examined under a microscope or tested for bacteria.  You may be referred to a health care provider who specializes in skin care (dermatologist). How is this treated? In many cases, epidermal cysts go away on their own without treatment. If a cyst becomes infected, treatment may include:  Opening and draining the cyst, done by a health care provider. After draining, minor surgery to remove the rest of the cyst may be done.  Antibiotic medicine.  Injections of medicines (steroids) that help to reduce inflammation.  Surgery to remove the cyst. Surgery may be done if the cyst: ? Becomes large. ? Bothers you. ? Has a chance of turning into cancer.  Do not try to open a cyst yourself. Follow these  instructions at home:  Take over-the-counter and prescription medicines only as told by your health care provider.  If you were prescribed an antibiotic medicine, take it it as told by your health care provider. Do not stop using the antibiotic even if you start to feel better.  Keep the area around your cyst clean and dry.  Wear loose, dry clothing.  Avoid touching your cyst.  Check your cyst every day for signs of infection. Check for: ? Redness, swelling, or pain. ? Fluid or blood. ? Warmth. ? Pus or a bad smell.  Keep all follow-up visits as told by your health care provider. This is important. How is this prevented?  Wear clean, dry, clothing.  Avoid wearing tight clothing.  Keep your skin clean and dry. Take showers or baths every day. Contact a health care provider if:  Your cyst develops symptoms of infection.  Your condition is not improving or is getting worse.  You develop a cyst that looks different from other cysts you have had.  You have a fever. Get help right away if:  Redness spreads from  the cyst into the surrounding area. Summary  An epidermal cyst is a sac made of skin tissue. These cysts are usually harmless (benign), and they may not cause symptoms unless they become infected.  If a cyst becomes infected, treatment may include surgery to open and drain the cyst, or to remove it. Treatment may also include medicines by mouth or through an injection.  Take over-the-counter and prescription medicines only as told by your health care provider. If you were prescribed an antibiotic medicine, take it as told by your health care provider. Do not stop using the antibiotic even if you start to feel better.  Contact a health care provider if your condition is not improving or is getting worse.  Keep all follow-up visits as told by your health care provider. This is important. This information is not intended to replace advice given to you by your health  care provider. Make sure you discuss any questions you have with your health care provider. Document Released: 06/17/2004 Document Revised: 11/07/2018 Document Reviewed: 01/28/2018 Elsevier Patient Education  [employer].   If you have lab work done today you will be contacted with your lab results within the next 2 weeks.  If you have not heard from [place] then please contact us. The fastest way to get your results is to register for My Chart.   IF you received an x-ray today, you will receive an invoice from [facility]. Please contact [facility] at [phone] with questions or concerns regarding your invoice.   IF you received labwork today, you will receive an invoice from [facility]. Please contact [facility] at [phone] with questions or concerns regarding your invoice.   Our billing staff will not be able to assist you with questions regarding bills from these companies.  You will be contacted with the lab results as soon as they are available. The fastest way to get your results is to activate your My Chart account. Instructions are located on the last page of this paperwork. If you have not heard from [place] regarding the results in 2 weeks, please contact this office.

## 2019-05-22 NOTE — Progress Notes (Signed)
Subjective:    Patient ID: Nicholas Patten., male    DOB: [birth year], 63 y.o.   MRN: [id number]  HPI Nicholas Walkenhorst. is a 63 y.o. male Presents today for: Chief Complaint  Patient presents with  . Annual Exam    And also need a lump on right flank area need to have looked at. No irritation just concern   Here for annual physical exam.  Lump on right flank area: Present for a long time. ? Whitehead or blocked gland. Squeezed substance out years ago.  Raised past 2-3 months. Min sore, redness past few months. Black spot in middle, no pus. No treatments. No recent eval with derm.    Cardiac: Cardiologist Dr. Martinique, history of paroxysmal atrial fibrillation and flutter. CHA2DS2-VASc score of 0. Takes aspirin 81 mg for anticoagulation.  Cardizem for rate control, previously increased dose needed to 240 mg daily, prior Bruce protocol stress test 9 minutes. Appointment September 28.  Minimal symptoms, continued same dose of diltiazem and aspirin.  Rare symptoms.   Hyperlipidemia:  Lab Results  Component Value Date   CHOL 146 11/26/2017   HDL 41 11/26/2017   LDLCALC 88 11/26/2017   TRIG 83 11/26/2017   CHOLHDL 3.6 11/26/2017   Lab Results  Component Value Date   ALT 32 11/26/2017   AST 39 11/26/2017   ALKPHOS 58 11/26/2017   BILITOT 0.6 11/26/2017  Prior hyperlipidemia, most recent testing in April 2019 normal as above.  Erectile dysfunction Managed with sildenafil and testosterone supplement by urology, Dr. Diona Fanti - increased viagra to 100mg  dosing at recent visit.   GERD Gastroenterology Dr. Michail Sermon, continued on PPI daily, has breakthrough heartburn with intermittent dosing. Has not seen GI this year.   Cancer screening Colonoscopy, March 2013, repeat 10 years.  GI Dr. Michail Sermon Prostate: Followed by urology, PSA 1.4 9/30 at urology recently.  Dermatology: Dr. Denna Haggard  - planning on calling for appt.   Immunization History  Administered Date(s)  Administered  . Influenza Split 04/30/2012, 05/26/2016  . Influenza,inj,Quad PF,6+ Mos 04/22/2013, 06/01/2014, 05/06/2015, 04/28/2019  . Tdap 04/21/2008  . Zoster 07/31/2014   Flu vaccine at Cardiology recently. Zostavax in 2016, Shingrix discussed - agrees to shingrix.    Depression screen [facility] 2/9 05/22/2019 09/24/2018 11/26/2017 08/23/2017 08/15/2017  Decreased Interest 0 0 0 0 0  Down, Depressed, Hopeless 0 0 0 0 0  PHQ - 2 Score 0 0 0 0 0    Hearing Screening   125Hz  250Hz  500Hz  1000Hz  2000Hz  3000Hz  4000Hz  6000Hz  8000Hz   Right ear:           Left ear:             Visual Acuity Screening   Right eye Left eye Both eyes  Without correction: 20/25 20/25 20/20   With correction:     optho - last appt within a few months, Dr. Prudencio Burly at [facility] ophthalmology.   Dental: every 6 months.   Exercise: 1 hr on Saturdays, then 45 min 2 days per week on treadmill.    Patient Active Problem List   Diagnosis Date Noted  . Laceration of left hand without foreign body 10/11/2016  . Contusion of right elbow 10/11/2016  . Paroxysmal atrial fibrillation ([place]) 10/24/2012  . Hypercholesterolemia 04/03/2012  . ED (erectile dysfunction) 03/05/2012  . Hypogonadism male 03/05/2012   Past Medical History:  Diagnosis Date  . Abdominal pain   . Atrial fibrillation ([place])   . Chest pain  negative stress echo in September 2013  . Diverticular disease   . Diverticulitis   . GERD (gastroesophageal reflux disease)    Past Surgical History:  Procedure Laterality Date  . COLON SURGERY    . PARTIAL COLECTOMY  11/11   Allergies  Allergen Reactions  . Penicillins     rash   Prior to Admission medications   Medication Sig Start Date End Date Taking? Authorizing Provider  aspirin 81 MG tablet Take 81 mg by mouth daily.   Yes [provider]  calcium carbonate (OS-CAL - DOSED IN MG OF ELEMENTAL CALCIUM) 1250 MG tablet Take 2 tablets by mouth 2 (two) times daily with a meal.    Yes [provider]  cholecalciferol (VITAMIN D) 1000 UNITS tablet Take 1,000 Units by mouth 2 (two) times daily.   Yes [provider]  diltiazem (CARDIZEM CD) 240 MG 24 hr capsule Take 1 capsule (240 mg total) by mouth daily. 08/29/18 08/24/19 Yes Martinique, Peter M, MD  Docusate Sodium (COLACE PO) Take 1 tablet by mouth as directed.   Yes [provider]  Multiple Vitamin (MULTIVITAMIN) tablet Take 1 tablet by mouth daily.   Yes [provider]  Omega-3 Fatty Acids (OMEGA 3 PO) Take 2 capsules by mouth daily.    Yes [provider]  omeprazole (PRILOSEC) 20 MG capsule Take 1 capsule (20 mg total) by mouth daily. 03/05/12  Yes [name], MD  Probiotic Product (PROBIOTIC-10 PO) Take 1 tablet by mouth daily.   Yes [provider]  sildenafil (REVATIO) 20 MG tablet Take by mouth. Take 1 to 5 tabs by mouth as needed for erectile dysfunction   Yes [provider]  tacrolimus (PROTOPIC) 0.1 % ointment Apply 1 application topically daily.    Yes [provider]  testosterone cypionate (DEPOTESTOSTERONE CYPIONATE) 200 MG/ML injection Inject into the muscle once a week.    Yes [provider]   Social History   Socioeconomic History  . Marital status: Married    Spouse name: Not on file  . Number of children: 0  . Years of education: Not on file  . Highest education level: Not on file  Occupational History  . Occupation: attorney    Employer: [employer]  Social Needs  . Financial resource strain: Not on file  . Food insecurity    Worry: Not on file    Inability: Not on file  . Transportation needs    Medical: Not on file    Non-medical: Not on file  Tobacco Use  . Smoking status: Never Smoker  . Smokeless tobacco: Never Used  Substance and Sexual Activity  . Alcohol use: No  . Drug use: No  . Sexual activity: Yes  Lifestyle  . Physical activity    Days per week: Not on file    Minutes per session: Not on file  .  Stress: Not on file  Relationships  . Social Herbalist on phone: Not on file    Gets together: Not on file    Attends religious service: Not on file    Active member of club or organization: Not on file    Attends meetings of clubs or organizations: Not on file    Relationship status: Not on file  . Intimate partner violence    Fear of current or ex partner: Not on file    Emotionally abused: Not on file    Physically abused: Not on file  Forced sexual activity: Not on file  Other Topics Concern  . Not on file  Social History Narrative   Married   Exercise: Yes   Education: Secretary/administrator  ] Review of Systems     Objective:   Physical Exam Vitals signs reviewed.  Constitutional:      Appearance: He is well-developed.  HENT:     Head: Normocephalic and atraumatic.     Right Ear: External ear normal.     Left Ear: External ear normal.  Eyes:     Conjunctiva/sclera: Conjunctivae normal.     Pupils: Pupils are equal, round, and reactive to light.  Neck:     Musculoskeletal: Normal range of motion and neck supple.     Thyroid: No thyromegaly.  Cardiovascular:     Rate and Rhythm: Normal rate and regular rhythm.     Heart sounds: Normal heart sounds.  Pulmonary:     Effort: Pulmonary effort is normal. No respiratory distress.     Breath sounds: Normal breath sounds. No wheezing.  Abdominal:     General: There is no distension.     Palpations: Abdomen is soft.     Tenderness: There is no abdominal tenderness.    Musculoskeletal: Normal range of motion.        General: No tenderness.  Lymphadenopathy:     Cervical: No cervical adenopathy.  Skin:    General: Skin is warm and dry.  Neurological:     Mental Status: He is alert and oriented to person, place, and time.     Deep Tendon Reflexes: Reflexes are normal and symmetric.  Psychiatric:        Behavior: Behavior normal.     Vitals:   05/22/19 0859  BP: 105/66  Pulse: (!) 58  Temp: 98.9 F (37.2 C)   TempSrc: Oral  SpO2: 97%  Weight: 172 lb 9.6 oz (78.3 kg)        Assessment & Plan:    Nicholas Panagakos. is a 63 y.o. male Annual physical exam  - -anticipatory guidance as below in AVS, screening labs above. Health maintenance items as above in HPI discussed/recommended as applicable.   Atrial fibrillation, unspecified type ([place]) - Plan: TSH  -Stable, continue aspirin for anticoagulation, Cardizem for rate control.  Screening for thyroid disorder - Plan: TSH  Screening, anemia, deficiency, iron - Plan: CBC  Screening for hyperlipidemia - Plan: Comprehensive metabolic panel, Lipid panel  Screening for diabetes mellitus - Plan: Comprehensive metabolic panel  Need for shingles vaccine - Plan: Zoster Vaccine Adjuvanted [facility]) injection to pharmacy  Ruptured sebaceous cyst - Plan: Ambulatory referral to Dermatology  -Some sebaceous material was expressed in office with gentle pressure, still suspect significant component behind darkened area.  Likely will need cyst excised.  Due for dermatology follow-up as well, will refer to dermatology for skin cancer screening and evaluation of cyst for excision.  RTC precautions if signs or symptoms of infection.  Meds ordered this encounter  Medications  . Zoster Vaccine Adjuvanted [facility]) injection    Sig: Inject 0.5 mLs into the muscle once for 1 dose. Repeat in 2-6 months.    Dispense:  0.5 mL    Refill:  1   Patient Instructions   Area on abdomen does appear to be a sebaceous or epidermal cyst.  I did express some of the sebaceous material in the office but still has significant amount.  May need to have that area completely excised, so will refer you to dermatology.  Watch for any increased redness, soreness or new discharge from area and if that occurs let me know.  Keep area clean with soap and water and covered if needed.  No other med changes at this time.  Thank you for coming in today and take care.     Keeping you  healthy  Get these tests  Blood pressure- Have your blood pressure checked once a year by your healthcare provider.  Normal blood pressure is 120/80  Weight- Have your body mass index (BMI) calculated to screen for obesity.  BMI is a measure of body fat based on height and weight. You can also calculate your own BMI at ViewBanking.si.  Cholesterol- Have your cholesterol checked every year.  Diabetes- Have your blood sugar checked regularly if you have high blood pressure, high cholesterol, have a family history of diabetes or if you are overweight.  Screening for Colon Cancer- Colonoscopy starting at age 52.  Screening may begin sooner depending on your family history and other health conditions. Follow up colonoscopy as directed by your Gastroenterologist.  Screening for Prostate Cancer- Both blood work (PSA) and a rectal exam help screen for Prostate Cancer.  Screening begins at age 80 with African-American men and at age 42 with Caucasian men.  Screening may begin sooner depending on your family history.  Take these medicines  Aspirin- One aspirin daily can help prevent Heart disease and Stroke.  Flu shot- Every fall.  Tetanus- Every 10 years.  Zostavax- Once after the age of 20 to prevent Shingles.  Pneumonia shot- Once after the age of 84; if you are younger than 36, ask your healthcare provider if you need a Pneumonia shot.  Take these steps  Don't smoke- If you do smoke, talk to your doctor about quitting.  For tips on how to quit, go to www.smokefree.gov or call 1-800-QUIT-NOW.  Be physically active- Exercise 5 days a week for at least 30 minutes.  If you are not already physically active start slow and gradually work up to 30 minutes of moderate physical activity.  Examples of moderate activity include walking briskly, mowing the yard, dancing, swimming, bicycling, etc.  Eat a healthy diet- Eat a variety of healthy food such as fruits, vegetables, low fat milk,  low fat cheese, yogurt, lean meant, poultry, fish, beans, tofu, etc. For more information go to www.thenutritionsource.org  Drink alcohol in moderation- Limit alcohol intake to less than two drinks a day. Never drink and drive.  Dentist- Brush and floss twice daily; visit your dentist twice a year.  Depression- Your emotional health is as important as your physical health. If you're feeling down, or losing interest in things you would normally enjoy please talk to your healthcare provider.  Eye exam- Visit your eye doctor every year.  Safe sex- If you may be exposed to a sexually transmitted infection, use a condom.  Seat belts- Seat belts can save your life; always wear one.  Smoke/Carbon Monoxide detectors- These detectors need to be installed on the appropriate level of your home.  Replace batteries at least once a year.  Skin cancer- When out in the sun, cover up and use sunscreen 15 SPF or higher.  Violence- If anyone is threatening you, please tell your healthcare provider.  Living Will/ Health care power of attorney- Speak with your healthcare provider and family.   Epidermal Cyst  An epidermal cyst is a sac made of skin tissue. The sac contains a substance called keratin. Keratin is a protein  that is normally secreted through the hair follicles. When keratin becomes trapped in the top layer of skin (epidermis), it can form an epidermal cyst. Epidermal cysts can be found anywhere on your body. These cysts are usually harmless (benign), and they may not cause symptoms unless they become infected. What are the causes? This condition may be caused by:  A blocked hair follicle.  A hair that curls and re-enters the skin instead of growing straight out of the skin (ingrown hair).  A blocked pore.  Irritated skin.  An injury to the skin.  Certain conditions that are passed along from parent to child (inherited).  Human papillomavirus (HPV).  Long-term (chronic) sun damage  to the skin. What increases the risk? The following factors may make you more likely to develop an epidermal cyst:  Having acne.  Being overweight.  Being 55-47 years old. What are the signs or symptoms? The only symptom of this condition may be a small, painless lump underneath the skin. When an epidermal cyst ruptures, it may become infected. Symptoms may include:  Redness.  Inflammation.  Tenderness.  Warmth.  Fever.  Keratin draining from the cyst. Keratin is grayish-white, bad-smelling substance.  Pus draining from the cyst. How is this diagnosed? This condition is diagnosed with a physical exam.  In some cases, you may have a sample of tissue (biopsy) taken from your cyst to be examined under a microscope or tested for bacteria.  You may be referred to a health care provider who specializes in skin care (dermatologist). How is this treated? In many cases, epidermal cysts go away on their own without treatment. If a cyst becomes infected, treatment may include:  Opening and draining the cyst, done by a health care provider. After draining, minor surgery to remove the rest of the cyst may be done.  Antibiotic medicine.  Injections of medicines (steroids) that help to reduce inflammation.  Surgery to remove the cyst. Surgery may be done if the cyst: ? Becomes large. ? Bothers you. ? Has a chance of turning into cancer.  Do not try to open a cyst yourself. Follow these instructions at home:  Take over-the-counter and prescription medicines only as told by your health care provider.  If you were prescribed an antibiotic medicine, take it it as told by your health care provider. Do not stop using the antibiotic even if you start to feel better.  Keep the area around your cyst clean and dry.  Wear loose, dry clothing.  Avoid touching your cyst.  Check your cyst every day for signs of infection. Check for: ? Redness, swelling, or pain. ? Fluid or blood. ?  Warmth. ? Pus or a bad smell.  Keep all follow-up visits as told by your health care provider. This is important. How is this prevented?  Wear clean, dry, clothing.  Avoid wearing tight clothing.  Keep your skin clean and dry. Take showers or baths every day. Contact a health care provider if:  Your cyst develops symptoms of infection.  Your condition is not improving or is getting worse.  You develop a cyst that looks different from other cysts you have had.  You have a fever. Get help right away if:  Redness spreads from the cyst into the surrounding area. Summary  An epidermal cyst is a sac made of skin tissue. These cysts are usually harmless (benign), and they may not cause symptoms unless they become infected.  If a cyst becomes infected, treatment may include surgery  to open and drain the cyst, or to remove it. Treatment may also include medicines by mouth or through an injection.  Take over-the-counter and prescription medicines only as told by your health care provider. If you were prescribed an antibiotic medicine, take it as told by your health care provider. Do not stop using the antibiotic even if you start to feel better.  Contact a health care provider if your condition is not improving or is getting worse.  Keep all follow-up visits as told by your health care provider. This is important. This information is not intended to replace advice given to you by your health care provider. Make sure you discuss any questions you have with your health care provider. Document Released: 06/17/2004 Document Revised: 11/07/2018 Document Reviewed: 01/28/2018 Elsevier Patient Education  [employer].   If you have lab work done today you will be contacted with your lab results within the next 2 weeks.  If you have not heard from [place] then please contact us. The fastest way to get your results is to register for My Chart.   IF you received an x-ray today, you will receive  an invoice from [facility]. Please contact [facility] at [phone] with questions or concerns regarding your invoice.   IF you received labwork today, you will receive an invoice from [place]. Please contact [facility] at [phone] with questions or concerns regarding your invoice.   Our billing staff will not be able to assist you with questions regarding bills from these companies.  You will be contacted with the lab results as soon as they are available. The fastest way to get your results is to activate your My Chart account. Instructions are located on the last page of this paperwork. If you have not heard from [place] regarding the results in 2 weeks, please contact this office.       Signed,   Merri Ray, MD [facility].  05/22/19 10:17 AM

## 2019-05-23 LAB — LIPID PANEL
Chol/HDL Ratio: 3.7 ratio (ref 0.0–5.0)
Cholesterol, Total: 160 mg/dL (ref 100–199)
HDL: 43 mg/dL (ref >39)
LDL Chol Calc (NIH): 97 mg/dL (ref 0–99)
Triglycerides: 108 mg/dL (ref 0–149)
VLDL Cholesterol Cal: 20 mg/dL (ref 5–40)

## 2019-05-23 LAB — CBC
Hematocrit: 53 % — ABNORMAL HIGH (ref 37.5–51.0)
Hemoglobin: 18 g/dL — ABNORMAL HIGH (ref 13.0–17.7)
MCH: 30.4 pg (ref 26.6–33.0)
MCHC: 34 g/dL (ref 31.5–35.7)
MCV: 89 fL (ref 79–97)
Platelets: 173 10*3/uL (ref 150–450)
RBC: 5.93 x10E6/uL — ABNORMAL HIGH (ref 4.14–5.80)
RDW: 12.3 % (ref 11.6–15.4)
WBC: 6.6 10*3/uL (ref 3.4–10.8)

## 2019-05-23 LAB — COMPREHENSIVE METABOLIC PANEL
ALT: 33 IU/L (ref 0–44)
AST: 40 IU/L (ref 0–40)
Albumin/Globulin Ratio: 1.7 (ref 1.2–2.2)
Albumin: 4.5 g/dL (ref 3.8–4.8)
Alkaline Phosphatase: 72 IU/L (ref 39–117)
BUN/Creatinine Ratio: 10 (ref 10–24)
BUN: 10 mg/dL (ref 8–27)
Bilirubin Total: 0.9 mg/dL (ref 0.0–1.2)
CO2: 23 mmol/L (ref 20–29)
Calcium: 9.7 mg/dL (ref 8.6–10.2)
Chloride: 103 mmol/L (ref 96–106)
Creatinine, Ser: 0.97 mg/dL (ref 0.76–1.27)
GFR calc Af Amer: 95 mL/min/{1.73_m2} (ref >59)
GFR calc non Af Amer: 82 mL/min/{1.73_m2} (ref >59)
Globulin, Total: 2.7 g/dL (ref 1.5–4.5)
Glucose: 77 mg/dL (ref 65–99)
Potassium: 4.3 mmol/L (ref 3.5–5.2)
Sodium: 142 mmol/L (ref 134–144)
Total Protein: 7.2 g/dL (ref 6.0–8.5)

## 2019-05-23 LAB — TSH: TSH: 1.74 u[IU]/mL (ref 0.450–4.500)

## 2019-06-10 DIAGNOSIS — L821 Other seborrheic keratosis: Secondary | ICD-10-CM | POA: Diagnosis not present

## 2019-06-10 DIAGNOSIS — D229 Melanocytic nevi, unspecified: Secondary | ICD-10-CM | POA: Diagnosis not present

## 2019-07-10 ENCOUNTER — Other Ambulatory Visit: Payer: Self-pay | Admitting: Dermatology

## 2019-07-10 DIAGNOSIS — L720 Epidermal cyst: Secondary | ICD-10-CM | POA: Diagnosis not present

## 2019-08-15 ENCOUNTER — Other Ambulatory Visit: Payer: Self-pay | Admitting: Cardiology

## 2019-08-29 ENCOUNTER — Ambulatory Visit: Payer: BC Managed Care – PPO

## 2019-09-06 ENCOUNTER — Ambulatory Visit: Payer: Medicare Other | Attending: Internal Medicine

## 2019-09-06 DIAGNOSIS — Z23 Encounter for immunization: Secondary | ICD-10-CM | POA: Insufficient documentation

## 2019-09-06 NOTE — Progress Notes (Signed)
   [id number]ination [facility]  Name:  Nicholas West.    MRN: [id number] DOB: Nov 09, 1955  09/06/2019  Nicholas West was observed post Covid-19 immunization for 15 minutes without incidence. He was provided with Vaccine Information Sheet and instruction to access the V-Safe system.   Nicholas West was instructed to call 911 with any severe reactions post vaccine: Marland Kitchen Difficulty breathing  . Swelling of your face and throat  . A fast heartbeat  . A bad rash all over your body  . Dizziness and weakness    Immunizations Administered    Name Date Dose VIS Date Route   Pfizer COVID-19 Vaccine 09/06/2019  5:00 PM 0.3 mL 07/11/2019 Intramuscular   Manufacturer: [place]   Lot: [id number]   [place]: [id number]

## 2019-09-10 DIAGNOSIS — H903 Sensorineural hearing loss, bilateral: Secondary | ICD-10-CM | POA: Diagnosis not present

## 2019-09-15 ENCOUNTER — Ambulatory Visit: Payer: BC Managed Care – PPO

## 2019-10-01 ENCOUNTER — Ambulatory Visit: Payer: Medicare Other | Attending: Internal Medicine

## 2019-10-01 DIAGNOSIS — Z23 Encounter for immunization: Secondary | ICD-10-CM

## 2019-10-01 NOTE — Progress Notes (Signed)
   [id number] Vaccination Clinic  Name:  Nicholas West.    MRN: [id number] DOB: 1955/08/11  10/01/2019  [name] was observed post Covid-19 immunization for 15 minutes without incident. He was provided with Vaccine Information Sheet and instruction to access the V-Safe system.   [name] was instructed to call 911 with any severe reactions post vaccine: Marland Kitchen Difficulty breathing  . Swelling of face and throat  . A fast heartbeat  . A bad rash all over body  . Dizziness and weakness   Immunizations Administered    Name Date Dose VIS Date Route   Pfizer COVID-19 Vaccine 10/01/2019  1:36 PM 0.3 mL 07/11/2019 Intramuscular   Manufacturer: [place]   Lot: [id number]   [place]: [id number]

## 2020-02-24 ENCOUNTER — Ambulatory Visit: Payer: Medicare Other | Admitting: Dermatology

## 2020-02-24 ENCOUNTER — Other Ambulatory Visit: Payer: Self-pay

## 2020-02-24 DIAGNOSIS — Z1283 Encounter for screening for malignant neoplasm of skin: Secondary | ICD-10-CM | POA: Diagnosis not present

## 2020-02-24 DIAGNOSIS — L729 Follicular cyst of the skin and subcutaneous tissue, unspecified: Secondary | ICD-10-CM | POA: Diagnosis not present

## 2020-02-24 DIAGNOSIS — D225 Melanocytic nevi of trunk: Secondary | ICD-10-CM | POA: Diagnosis not present

## 2020-02-24 DIAGNOSIS — L570 Actinic keratosis: Secondary | ICD-10-CM | POA: Diagnosis not present

## 2020-02-24 DIAGNOSIS — D229 Melanocytic nevi, unspecified: Secondary | ICD-10-CM

## 2020-02-24 NOTE — Patient Instructions (Addendum)
Routine follow-up with 1 special focus for Aayansh Codispoti date of birth 1956/07/17.  The special focus is recurrence of the small benign cyst on his right lower outer abdomen including an episode with acute inflammation.  Now there is no inflammation but there is a small keratinous horn along with a feelable 1 cm subcutaneous nodule which is a mixture of residual cyst plus fibrosis.  I explained to Mr. Kardell that this is not a rare recurrence and he has the same options of leaving this (with risk of reinflammation) or rescheduling removal (with a risk of rerecurrence).  He will think this over and discuss this with his wife and get back to me if he wants surgery done.  The rest of his skin examination from the waist up shows no sign of any atypical moles or melanoma or skin cancer.  He does have some solar keratoses (precancers) on the crown of his scalp; these are quite small and do not require immediate intervention.  If he gets more of them or wants to be proactive in his care, in the winter we will sit down and discuss use of topical fluorouracil or PDT for this area.  He also has a tan 1cm textured spot on his left arm which is a benign keratosis which currently requires no intervention.  Mr. Sellin knows if he has any questions or if that cyst reinflamed he could contact me anytime.

## 2020-04-03 ENCOUNTER — Encounter: Payer: Self-pay | Admitting: Dermatology

## 2020-04-03 NOTE — Progress Notes (Signed)
   Follow-Up Visit   Subjective  Nicholas West. is a 64 y.o. male who presents for the following: Follow-up (pt stated--spot removed and cameback boil with clear liguid--. right side of the abdominal.).  Recurrent cyst Location: Right abdomen Duration:  Quality:  Associated Signs/Symptoms: Modifying Factors:  Severity:  Timing: Context:   Objective  Well appearing patient in no apparent distress; mood and affect are within normal limits.  All skin waist up examined.   Assessment & Plan  Routine follow-up with 1 special focus for Nicholas West date of birth 11-23-1955.  The special focus is recurrence of the small benign cyst on his right lower outer abdomen including an episode with acute inflammation.  Now there is no inflammation but there is a small keratinous horn along with a feelable 1 cm subcutaneous nodule which is a mixture of residual cyst plus fibrosis.  I explained to Nicholas West that this is not a rare recurrence and he has the same options of leaving this (with risk of reinflammation) or rescheduling removal (with a risk of rerecurrence).  He will think this over and discuss this with his wife and get back to me if he wants surgery done.  The rest of his skin examination from the waist up shows no sign of any atypical moles or melanoma or skin cancer.  He does have some solar keratoses (precancers) on the crown of his scalp; these are quite small and do not require immediate intervention.  If he gets more of them or wants to be proactive in his care, in the winter we will sit down and discuss use of topical fluorouracil or PDT for this area.  He also has a tan 1cm textured spot on his left arm which is a benign keratosis which currently requires no intervention.  Nicholas West knows if he has any questions or if that cyst reinflamed he could contact me anytime.  Cyst of skin Right Inguinal Area  Patient will decide if he wants cyst reexcised.  We will schedule I hour if  he chooses surgery.  AK (actinic keratosis) Mid Parietal Scalp  May choose topical fluorouracil or PDT in the winter.  Nevus Mid Back  Annual skin examination     I, Lavonna Monarch, MD, have reviewed all documentation for this visit.  The documentation on 04/03/20 for the exam, diagnosis, procedures, and orders are all accurate and complete.

## 2020-04-22 NOTE — Progress Notes (Signed)
[place] Date of Birth: 07/19/1956 Medical Record #[id number]  History of Present Illness: Nicholas West is seen for follow up of atrial fibrillation.   He has a history of paroxysmal atrial fibrillation. He has been managed with rate control with diltiazem. He has a  Mali vascular score of 0. He is taking a baby aspirin daily.   On his prior visit he complained of nausea and tingling in his arms with exercise. We arranged a POET. On arrival he was in AFib with rate 98. He exercised for 9 minutes on the Bruce protocol without symptoms or ST changes. He converted to NSR then went back into atrial flutter with RVR. His Cardizem was increased to 240 mg daily.    On follow today he reports he is doing very well.  He is walking on treadmill at home. No chest pain, dizziness, chest pain. Notes rare arrhythmia usually in the early morning but it doesn't last long or cause any limitation. Notes some mild sense of imbalance at times.  Current Outpatient Medications on File Prior to Visit  Medication Sig Dispense Refill  . aspirin 81 MG tablet Take 81 mg by mouth daily.    . B-D 3CC LUER-LOK SYR 23GX1" 23G X 1" 3 ML MISC SMARTSIG:1 Injection Once a Week    . B-D HYPODERMIC NEEDLE 18GX1.5" 18G X 1-1/2" MISC USE TO DRAW UP MEDICATION    . calcium carbonate (OS-CAL - DOSED IN MG OF ELEMENTAL CALCIUM) 1250 MG tablet Take 2 tablets by mouth 2 (two) times daily with a meal.     . CARTIA XT 240 MG 24 hr capsule TAKE 1 CAPSULE(240 MG) BY MOUTH DAILY 90 capsule 3  . cholecalciferol (VITAMIN D) 1000 UNITS tablet Take 1,000 Units by mouth 2 (two) times daily.    Mariane Baumgarten Sodium (COLACE PO) Take 1 tablet by mouth as directed.    . Multiple Vitamin (MULTIVITAMIN) tablet Take 1 tablet by mouth daily.    . Omega-3 Fatty Acids (OMEGA 3 PO) Take 2 capsules by mouth daily.     Marland Kitchen omeprazole (PRILOSEC) 20 MG capsule Take 1 capsule (20 mg total) by mouth daily. 30 capsule 11  . Probiotic Product (PROBIOTIC-10 PO)  Take 1 tablet by mouth daily.    . sildenafil (REVATIO) 20 MG tablet Take by mouth. Take 1 to 5 tabs by mouth as needed for erectile dysfunction    . tacrolimus (PROTOPIC) 0.1 % ointment Apply 1 application topically daily.     Marland Kitchen testosterone cypionate (DEPOTESTOSTERONE CYPIONATE) 200 MG/ML injection Inject into the muscle once a week.      No current facility-administered medications on file prior to visit.    Allergies  Allergen Reactions  . Penicillins     rash    Past Medical History:  Diagnosis Date  . Abdominal pain   . Atrial fibrillation ([place])   . BCC (basal cell carcinoma) 09/14/2010   ulcerated, top right scalp, Treatment Zyclara  . BCC (basal cell carcinoma) 02/05/2013   Recurrent, top right scalp, MOHs  . BCC (basal cell carcinoma) 09/05/2017   Mico nod, left forehead, MOHs (Dr Link Snuffer)  . Chest pain    negative stress echo in September 2013  . Diverticular disease   . Diverticulitis   . Dysplastic nevus    moderate, Left back no treatment  . GERD (gastroesophageal reflux disease)     Past Surgical History:  Procedure Laterality Date  . COLON SURGERY    . PARTIAL COLECTOMY  11/11    Social History   Tobacco Use  Smoking Status Never Smoker  Smokeless Tobacco Never Used    Social History   Substance and Sexual Activity  Alcohol Use No    Family History  Problem Relation Age of Onset  . Cancer Mother   . Alzheimer's disease Father   . Colon polyps Father   . GER disease Father   . Heart disease Paternal Grandmother   . Cancer Paternal Grandfather     Review of Systems: The review of systems is per the HPI.  All other systems were reviewed and are negative.  Physical Exam: BP 125/79   Pulse 62   Ht 6\' 1"  (1.854 m)   Wt 179 lb 9.6 oz (81.5 kg)   SpO2 94%   BMI 23.70 kg/m  GENERAL:  Well appearing WM in NAD HEENT:  PERRL, EOMI, sclera are clear. Oropharynx is clear. NECK:  No jugular venous distention, carotid upstroke brisk and  symmetric, no bruits, no thyromegaly or adenopathy LUNGS:  Clear to auscultation bilaterally CHEST:  Unremarkable HEART:  RRR,  PMI not displaced or sustained,S1 and S2 within normal limits, no S3, no S4: no clicks, no rubs, no murmurs ABD:  Soft, nontender. BS +, no masses or bruits. No hepatomegaly, no splenomegaly EXT:  2 + pulses throughout, no edema, no cyanosis no clubbing SKIN:  Warm and dry.  No rashes NEURO:  Alert and oriented x 3. Cranial nerves II through XII intact. PSYCH:  Cognitively intact  LABORATORY DATA:   Lab Results  Component Value Date   WBC 6.6 05/22/2019   HGB 18.0 (H) 05/22/2019   HCT 53.0 (H) 05/22/2019   PLT 173 05/22/2019   GLUCOSE 77 05/22/2019   CHOL 160 05/22/2019   TRIG 108 05/22/2019   HDL 43 05/22/2019   LDLCALC 97 05/22/2019   ALT 33 05/22/2019   AST 40 05/22/2019   NA 142 05/22/2019   K 4.3 05/22/2019   CL 103 05/22/2019   CREATININE 0.97 05/22/2019   BUN 10 05/22/2019   CO2 23 05/22/2019   TSH 1.740 05/22/2019   PSA 1.38 10/29/2015   INR 1.10 06/04/2013    Ecg today shows NSR rate 62. Normal. I have personally reviewed and interpreted this study.  ETT 08/31/17: Study Highlights    Blood pressure demonstrated a blunted response to exercise.  Patient started the test in atrial fibrillation and converted to sinus rhythm and subsequently atrial flutter with rapid ventricular response with exercise.  There was no ST segment deviation noted during stress.  Negative, adequate stress test.     Assessment / Plan: 1. Paroxysmal atrial fibrillation and flutter. Patient is minimally symptomatic. He has a  Mali vascular score of 0.  Continue  diltiazem daily.    Follow up in one year

## 2020-04-28 ENCOUNTER — Encounter: Payer: Self-pay | Admitting: Cardiology

## 2020-04-28 ENCOUNTER — Ambulatory Visit (INDEPENDENT_AMBULATORY_CARE_PROVIDER_SITE_OTHER): Payer: Medicare Other | Admitting: Cardiology

## 2020-04-28 ENCOUNTER — Other Ambulatory Visit: Payer: Self-pay

## 2020-04-28 VITALS — BP 125/79 | HR 62 | Ht 73.0 in | Wt 179.6 lb

## 2020-04-28 DIAGNOSIS — I480 Paroxysmal atrial fibrillation: Secondary | ICD-10-CM | POA: Diagnosis not present

## 2020-05-13 ENCOUNTER — Other Ambulatory Visit: Payer: Self-pay | Admitting: Cardiology

## 2020-05-25 ENCOUNTER — Encounter: Payer: BC Managed Care – PPO | Admitting: Family Medicine

## 2020-05-25 DIAGNOSIS — R948 Abnormal results of function studies of other organs and systems: Secondary | ICD-10-CM | POA: Diagnosis not present

## 2020-05-25 DIAGNOSIS — E291 Testicular hypofunction: Secondary | ICD-10-CM | POA: Diagnosis not present

## 2020-05-31 DIAGNOSIS — E291 Testicular hypofunction: Secondary | ICD-10-CM | POA: Diagnosis not present

## 2020-05-31 DIAGNOSIS — N400 Enlarged prostate without lower urinary tract symptoms: Secondary | ICD-10-CM | POA: Diagnosis not present

## 2020-05-31 DIAGNOSIS — N5201 Erectile dysfunction due to arterial insufficiency: Secondary | ICD-10-CM | POA: Diagnosis not present

## 2020-06-09 ENCOUNTER — Telehealth: Payer: Self-pay | Admitting: *Deleted

## 2020-06-09 ENCOUNTER — Other Ambulatory Visit: Payer: Self-pay | Admitting: Dermatology

## 2020-06-09 MED ORDER — TACROLIMUS 0.1 % EX OINT: 1.0000 "application " | TOPICAL_OINTMENT | Freq: Every day | CUTANEOUS | 2 refills | Status: DC

## 2020-06-09 NOTE — Telephone Encounter (Signed)
Prior authorization required- done via cover my meds for Tacrolimus 0.1 % Cream

## 2020-06-09 NOTE — Telephone Encounter (Signed)
Calling to let patient know I sent that refill to his pharmacy.  Left message for patient to return my call.

## 2020-06-09 NOTE — Telephone Encounter (Signed)
Patient called back and I let him know that we sent that refill to his pharmacy.  He understood.

## 2020-06-09 NOTE — Telephone Encounter (Signed)
Refill for Tacrolimus 0.03 % ointment. Wife would like it sent to [facility] on [address].

## 2020-06-10 ENCOUNTER — Telehealth: Payer: Self-pay | Admitting: Dermatology

## 2020-06-10 NOTE — Telephone Encounter (Signed)
[employer] called [phone], option 5. Needs to ascertain whether he needs tacrolimus cream or ointment. If ointment, they need "tried and failed" info.

## 2020-06-11 NOTE — Telephone Encounter (Signed)
Phone call to [employer] to answer more questions on the Patient PA for Tacrolimus ointment. They will call/fax over the results once a decision is made.

## 2020-06-15 NOTE — Telephone Encounter (Signed)
This note from [facility] says that the medication is approved.

## 2020-08-02 ENCOUNTER — Encounter: Payer: Self-pay | Admitting: Family Medicine

## 2020-08-02 ENCOUNTER — Ambulatory Visit (INDEPENDENT_AMBULATORY_CARE_PROVIDER_SITE_OTHER): Payer: Medicare Other | Admitting: Family Medicine

## 2020-08-02 ENCOUNTER — Other Ambulatory Visit: Payer: Self-pay

## 2020-08-02 VITALS — BP 129/75 | HR 60 | Temp 98.4°F | Ht 71.0 in | Wt 183.0 lb

## 2020-08-02 DIAGNOSIS — Z0001 Encounter for general adult medical examination with abnormal findings: Secondary | ICD-10-CM | POA: Diagnosis not present

## 2020-08-02 DIAGNOSIS — K449 Diaphragmatic hernia without obstruction or gangrene: Secondary | ICD-10-CM

## 2020-08-02 DIAGNOSIS — I4891 Unspecified atrial fibrillation: Secondary | ICD-10-CM | POA: Diagnosis not present

## 2020-08-02 DIAGNOSIS — R0789 Other chest pain: Secondary | ICD-10-CM | POA: Diagnosis not present

## 2020-08-02 DIAGNOSIS — R002 Palpitations: Secondary | ICD-10-CM | POA: Diagnosis not present

## 2020-08-02 DIAGNOSIS — Z23 Encounter for immunization: Secondary | ICD-10-CM | POA: Diagnosis not present

## 2020-08-02 DIAGNOSIS — R5383 Other fatigue: Secondary | ICD-10-CM | POA: Diagnosis not present

## 2020-08-02 DIAGNOSIS — Z0000 Encounter for general adult medical examination without abnormal findings: Secondary | ICD-10-CM

## 2020-08-02 HISTORY — DX: Diaphragmatic hernia without obstruction or gangrene: K44.9

## 2020-08-02 NOTE — Progress Notes (Signed)
Subjective:  Patient ID: Nicholas Patten., male    DOB: 05/18/1956  Age: 65 y.o. MRN: [id number]  CC:  Chief Complaint  Patient presents with  . Annual Exam    Pt reports his general health he feels good, but states on this past Friday he was feeling some fatigue, ache in both legs, and some discomfort in his chest. Pt reports no discomfort was located in the center of his chest to his R side of his chest.     HPI Nicholas Patten. presents for  Annual wellness exam as well as acute concerns as above.  Leg pain, chest pain: Few episodes of fatigue, aches in muscles past few weeks, then improved overnight. Initial chest  symptoms 3 days ago.  Fatigue, ache in both legs with some chest discomfort, center of chest to right side of chest. Slight discomfort. No associated dyspnea, n/v, or diaphoresis. Nonexertional.  Did note some palpitations at the time and feels like palpitations occurring more often past few weeks. Has not discussed with cardiology. CP resolved 2 days ago - none at present.  No calf swelling. No recent prolonged car travel or air travel, no recent calf pain or swelling. Flying to [place] later this week for [employer] for sister who passed in October.  Feels like normal self today.   History of paroxysmal atrial fibrillation, takes Cardizem CD 240 mg daily.  Cardiologist Dr. Martinique.  Last office visit in September, feeling well at that time.  Rare arrhythmia early in the morning without limitations reported.  Was walking on the treadmill at home at that time.  CHA2DS2-VASc score 0, continued on diltiazem.  Aspirin 81 mg daily. Previous exercise stress test in February 2019 with 10 mets, A. fib, sinus rhythm, then a flutter with RVR, Cardizem had been increased to 240 mg.  Normal lipid panel in October 2020, normal TSH in October 2020, slight elevated hemoglobin of 18.0 in October 2020. No CP with exertion.   Care team: PCP, me Cardiology, Dr.  Martinique Dermatology Dr. Denna Haggard  Fall Risk  08/02/2020 05/22/2019 09/24/2018 11/26/2017 08/23/2017  Falls in the past year? 0 0 0 No No  Number falls in past yr: - 0 - - -  Injury with Fall? - 0 - - -  Comment - - - - -  Follow up Falls evaluation completed Falls evaluation completed Falls evaluation completed - -  adequate lighting at home.  1 flight stairs - has handrail.  No loose rugs.  No grab bars in bathroom.   Depression screen [facility] 2/9 08/02/2020 05/22/2019 09/24/2018 11/26/2017 08/23/2017  Decreased Interest 0 0 0 0 0  Down, Depressed, Hopeless 0 0 0 0 0  PHQ - 2 Score 0 0 0 0 0   Cancer screening: Colonoscopy 10/02/2011, repeat 10 years? [facility] - Dr. Michail Sermon.  Followed by urology for low testosterone. PSA 2.04 in October.  Has DRE/prostate testing at urology.   Immunization History  Administered Date(s) Administered  . Influenza Split 04/30/2012, 05/26/2016  . Influenza,inj,Quad PF,6+ Mos 04/22/2013, 06/01/2014, 05/06/2015, 05/25/2017, 05/25/2018, 04/28/2019  . Influenza-Unspecified 07/16/2020  . PFIZER SARS-COV-2 Vaccination 09/06/2019, 10/01/2019, 05/15/2020  . Tdap 04/21/2008  . Zoster 07/31/2014  Shingles vaccine: had shingrix through [facility].  Covid booster: Pfizer 10/16//21 Pneumococcal vaccine: prevnar 13 today.   Functional Status Survey: Is the patient deaf or have difficulty hearing?: Yes Does the patient have difficulty seeing, even when wearing glasses/contacts?: No Does the patient have difficulty concentrating, remembering, or  making decisions?: No Does the patient have difficulty walking or climbing stairs?: No Does the patient have difficulty dressing or bathing?: No Does the patient have difficulty doing errands alone such as visiting a doctor's office or shopping?: No  Wears hearing aids - working ok.   6CIT Screen 08/02/2020 11/26/2017  What Year? 0 points 0 points  What month? 0 points 0 points  What time? 3 points 0 points  Count back from 20 0 points 0  points  Months in reverse 0 points 0 points  Repeat phrase 0 points 0 points  Total Score 3 0    Hearing Screening   _0  _1  _2  _3  _4  _5  _6  _7  _8   Right ear:           Left ear:             Visual Acuity Screening   Right eye Left eye Both eyes  Without correction:     With correction: _9    No alcohol. No tobacco.   Dental: every 6 months.   Exercise: walking on treadmill for 60 min once per week.   Advanced directives: He has a healthcare power of attorney and living will.   History Patient Active Problem List   Diagnosis Date Noted  . Hiatal hernia 08/02/2020  . Laceration of left hand without foreign body 10/11/2016  . Contusion of right elbow 10/11/2016  . Paroxysmal atrial fibrillation ([place]) 10/24/2012  . Hypercholesterolemia 04/03/2012  . ED (erectile dysfunction) 03/05/2012  . Hypogonadism male 03/05/2012   Past Medical History:  Diagnosis Date  . Abdominal pain   . Atrial fibrillation ([place])   . BCC (basal cell carcinoma) 09/14/2010   ulcerated, top right scalp, Treatment Zyclara  . BCC (basal cell carcinoma) 02/05/2013   Recurrent, top right scalp, MOHs  . BCC (basal cell carcinoma) 09/05/2017   Mico nod, left forehead, MOHs (Dr Link Snuffer)  . Chest pain    negative stress echo in September 2013  . Diverticular disease   . Diverticulitis   . Dysplastic nevus    moderate, Left back no treatment  . GERD (gastroesophageal reflux disease)    Past Surgical History:  Procedure Laterality Date  . COLON SURGERY    . PARTIAL COLECTOMY  11/11   Allergies  Allergen Reactions  . Penicillins     rash   Prior to Admission medications   Medication Sig Start Date End Date Taking? Authorizing Provider  aspirin 81 MG tablet Take 81 mg by mouth daily.    [provider]  B-D 3CC LUER-LOK SYR 23GX1" 23G X 1" 3 ML MISC SMARTSIG:1 Injection Once a Week 02/06/20   [provider]  B-D HYPODERMIC NEEDLE  18GX1.5" 18G X 1-1/2" MISC USE TO DRAW UP MEDICATION 04/01/20   [provider]  calcium carbonate (OS-CAL - DOSED IN MG OF ELEMENTAL CALCIUM) 1250 MG tablet Take 2 tablets by mouth 2 (two) times daily with a meal.     [provider]  cholecalciferol (VITAMIN D) 1000 UNITS tablet Take 1,000 Units by mouth 2 (two) times daily.    [provider]  diltiazem (CARDIZEM CD) 240 MG 24 hr capsule TAKE 1 CAPSULE(240 MG) BY MOUTH DAILY 05/13/20   Martinique, Peter M, MD  Docusate Sodium (COLACE PO) Take 1 tablet by mouth as directed.    [provider]  Multiple Vitamin (MULTIVITAMIN) tablet Take 1 tablet by mouth daily.    [provider]  Omega-3 Fatty Acids (OMEGA  3 PO) Take 2 capsules by mouth daily.     [provider]  omeprazole (PRILOSEC) 20 MG capsule Take 1 capsule (20 mg total) by mouth daily. 03/05/12   [name], MD  Probiotic Product (PROBIOTIC-10 PO) Take 1 tablet by mouth daily.    [provider]  sildenafil (REVATIO) 20 MG tablet Take by mouth. Take 1 to 5 tabs by mouth as needed for erectile dysfunction    [provider]  tacrolimus (PROTOPIC) 0.1 % ointment Apply 1 application topically daily. 06/09/20   Lavonna Monarch, MD  testosterone cypionate (DEPOTESTOSTERONE CYPIONATE) 200 MG/ML injection Inject into the muscle once a week.     [provider]   Social History   Socioeconomic History  . Marital status: Married    Spouse name: Not on file  . Number of children: 0  . Years of education: Not on file  . Highest education level: Not on file  Occupational History  . Occupation: attorney    Employer: [employer]  Tobacco Use  . Smoking status: Never Smoker  . Smokeless tobacco: Never Used  Substance and Sexual Activity  . Alcohol use: No  . Drug use: No  . Sexual activity: Yes  Other Topics Concern  . Not on file  Social History Narrative   Married   Exercise: Yes   Education: Secretary/administrator    Social Determinants of Health   Financial Resource Strain: Not on file  Food Insecurity: Not on file  Transportation Needs: Not on file  Physical Activity: Not on file  Stress: Not on file  Social Connections: Not on file  Intimate Partner Violence: Not on file    Review of Systems 13 point review of systems per patient health survey noted.  Negative other than as indicated above or in HPI.    Objective:   Vitals:   08/02/20 0906  BP: 129/75  Pulse: 60  Temp: 98.4 F (36.9 C)  TempSrc: Temporal  SpO2: 95%  Weight: 183 lb (83 kg)  Height: _0  (1.803 m)     Physical Exam Vitals reviewed.  Constitutional:      Appearance: He is well-developed.  HENT:     Head: Normocephalic and atraumatic.     Right Ear: External ear normal.     Left Ear: External ear normal.  Eyes:     Conjunctiva/sclera: Conjunctivae normal.     Pupils: Pupils are equal, round, and reactive to light.  Neck:     Thyroid: No thyromegaly.  Cardiovascular:     Rate and Rhythm: Normal rate and regular rhythm.     Heart sounds: Normal heart sounds.  Pulmonary:     Effort: Pulmonary effort is normal. No respiratory distress.     Breath sounds: Normal breath sounds. No wheezing.  Abdominal:     General: There is no distension.     Palpations: Abdomen is soft.     Tenderness: There is no abdominal tenderness.     Hernia: There is no hernia in the left inguinal area or right inguinal area.  Genitourinary:    Prostate: Normal.  Musculoskeletal:        General: No tenderness. Normal range of motion.     Cervical back: Normal range of motion and neck supple.  Lymphadenopathy:     Cervical: No cervical adenopathy.  Skin:    General: Skin is warm and dry.  Neurological:     Mental Status: He is alert and oriented to person, place,  and time.     Deep Tendon Reflexes: Reflexes are normal and symmetric.  Psychiatric:        Behavior: Behavior normal.     EKG: Sinus bradycardia with rate of  53 RSR in V1, no apparent acute ST or T wave changes.  Compared to April 28, 2020 appears similar. Assessment & Plan:  Nicholas Micco. is a 65 y.o. male . Encounter for Medicare annual wellness exam  - - anticipatory guidance as below in AVS, screening labs if needed. Health maintenance items as above in HPI discussed/recommended as applicable.  - no concerning responses on depression, fall, or functional status screening. Any positive responses noted as above. Advanced directives discussed as in CHL.   Need for vaccination - Plan: Pneumococcal conjugate vaccine 13-valent  -Prevnar given  Other chest pain - Plan: EKG 12-Lead Fatigue, unspecified type Atrial fibrillation, unspecified type ([place]) - Plan: TSH, CBC, Basic metabolic panel Palpitations - Plan: TSH, CBC, Basic metabolic panel  -Intermittent episodes of fatigue, myalgias followed by onset of chest pain last week, quickly resolved.  Nonexertional.  Has noticed increased frequency of palpitations.  Check TSH, CBC, BMP, but advised to call his cardiologist to decide on other testing or possible monitoring.  ER/urgent care precautions if symptoms return.   No orders of the defined types were placed in this encounter.  Patient Instructions   I recommend calling Dr. Martinique today to discuss recent symptoms of chest discomfort, and increased frequency of afib. I will check some labs as well today. If symptoms return - be seen right away.  Return to the clinic or go to the nearest emergency room if any of your symptoms worsen or new symptoms occur.  Thanks for coming in today.   Preventive Care 65 Years and Older, Male Preventive care refers to lifestyle choices and visits with your health care provider that can promote health and wellness. This includes:  A yearly physical exam. This is also called an annual well check.  Regular dental and eye exams.  Immunizations.  Screening for certain conditions.  Healthy lifestyle  choices, such as diet and exercise. What can I expect for my preventive care visit? Physical exam Your health care provider will check:  Height and weight. These may be used to calculate body mass index (BMI), which is a measurement that tells if you are at a healthy weight.  Heart rate and blood pressure.  Your skin for abnormal spots. Counseling Your health care provider may ask you questions about:  Alcohol, tobacco, and drug use.  Emotional well-being.  Home and relationship well-being.  Sexual activity.  Eating habits.  History of falls.  Memory and ability to understand (cognition).  Work and work Statistician. What immunizations do I need?  Influenza (flu) vaccine  This is recommended every year. Tetanus, diphtheria, and pertussis (Tdap) vaccine  You may need a Td booster every 10 years. Varicella (chickenpox) vaccine  You may need this vaccine if you have not already been vaccinated. Zoster (shingles) vaccine  You may need this after age 48. Pneumococcal conjugate (PCV13) vaccine  One dose is recommended after age 19. Pneumococcal polysaccharide (PPSV23) vaccine  One dose is recommended after age 46. Measles, mumps, and rubella (MMR) vaccine  You may need at least one dose of MMR if you were born in 1957 or later. You may also need a second dose. Meningococcal conjugate (MenACWY) vaccine  You may need this if you have certain conditions. Hepatitis A vaccine  You  may need this if you have certain conditions or if you travel or work in places where you may be exposed to hepatitis A. Hepatitis B vaccine  You may need this if you have certain conditions or if you travel or work in places where you may be exposed to hepatitis B. Haemophilus influenzae type b (Hib) vaccine  You may need this if you have certain conditions. You may receive vaccines as individual doses or as more than one vaccine together in one shot (combination vaccines). Talk with your  health care provider about the risks and benefits of combination vaccines. What tests do I need? Blood tests  Lipid and cholesterol levels. These may be checked every 5 years, or more frequently depending on your overall health.  Hepatitis C test.  Hepatitis B test. Screening  Lung cancer screening. You may have this screening every year starting at age 59 if you have a 30-pack-year history of smoking and currently smoke or have quit within the past 15 years.  Colorectal cancer screening. All adults should have this screening starting at age 13 and continuing until age 22. Your health care provider may recommend screening at age 56 if you are at increased risk. You will have tests every 1-10 years, depending on your results and the type of screening test.  Prostate cancer screening. Recommendations will vary depending on your family history and other risks.  Diabetes screening. This is done by checking your blood sugar (glucose) after you have not eaten for a while (fasting). You may have this done every 1-3 years.  Abdominal aortic aneurysm (AAA) screening. You may need this if you are a current or former smoker.  Sexually transmitted disease (STD) testing. Follow these instructions at home: Eating and drinking  Eat a diet that includes fresh fruits and vegetables, whole grains, lean protein, and low-fat dairy products. Limit your intake of foods with high amounts of sugar, saturated fats, and salt.  Take vitamin and mineral supplements as recommended by your health care provider.  Do not drink alcohol if your health care provider tells you not to drink.  If you drink alcohol: ? Limit how much you have to 0-2 drinks a day. ? Be aware of how much alcohol is in your drink. In the [place] one drink equals one 12 oz bottle of beer (355 mL), one 5 oz glass of wine (148 mL), or one 1 oz glass of hard liquor (44 mL). Lifestyle  Take daily care of your teeth and gums.  Stay active.  Exercise for at least 30 minutes on 5 or more days each week.  Do not use any products that contain nicotine or tobacco, such as cigarettes, e-cigarettes, and chewing tobacco. If you need help quitting, ask your health care provider.  If you are sexually active, practice safe sex. Use a condom or other form of protection to prevent STIs (sexually transmitted infections).  Talk with your health care provider about taking a low-dose aspirin or statin. What's next?  Visit your health care provider once a year for a well check visit.  Ask your health care provider how often you should have your eyes and teeth checked.  Stay up to date on all vaccines. This information is not intended to replace advice given to you by your health care provider. Make sure you discuss any questions you have with your health care provider. Document Revised: 07/11/2018 Document Reviewed: 07/11/2018 Elsevier Patient Education  [employer].   If you have lab  work done today you will be contacted with your lab results within the next 2 weeks.  If you have not heard from [place] then please contact us. The fastest way to get your results is to register for My Chart.   IF you received an x-ray today, you will receive an invoice from [facility]. Please contact [facility] at [phone] with questions or concerns regarding your invoice.   IF you received labwork today, you will receive an invoice from [facility]. Please contact [facility] at [phone] with questions or concerns regarding your invoice.   Our billing staff will not be able to assist you with questions regarding bills from these companies.  You will be contacted with the lab results as soon as they are available. The fastest way to get your results is to activate your My Chart account. Instructions are located on the last page of this paperwork. If you have not heard from [place] regarding the results in 2 weeks, please contact this  office.          Signed, Merri Ray, MD [facility]

## 2020-08-02 NOTE — Patient Instructions (Addendum)
I recommend calling Dr. Martinique today to discuss recent symptoms of chest discomfort, and increased frequency of afib. I will check some labs as well today. If symptoms return - be seen right away.  Return to the clinic or go to the nearest emergency room if any of your symptoms worsen or new symptoms occur.  Thanks for coming in today.   Preventive Care 22 Years and Older, Male Preventive care refers to lifestyle choices and visits with your health care provider that can promote health and wellness. This includes:  A yearly physical exam. This is also called an annual well check.  Regular dental and eye exams.  Immunizations.  Screening for certain conditions.  Healthy lifestyle choices, such as diet and exercise. What can I expect for my preventive care visit? Physical exam Your health care provider will check:  Height and weight. These may be used to calculate body mass index (BMI), which is a measurement that tells if you are at a healthy weight.  Heart rate and blood pressure.  Your skin for abnormal spots. Counseling Your health care provider may ask you questions about:  Alcohol, tobacco, and drug use.  Emotional well-being.  Home and relationship well-being.  Sexual activity.  Eating habits.  History of falls.  Memory and ability to understand (cognition).  Work and work Statistician. What immunizations do I need?  Influenza (flu) vaccine  This is recommended every year. Tetanus, diphtheria, and pertussis (Tdap) vaccine  You may need a Td booster every 10 years. Varicella (chickenpox) vaccine  You may need this vaccine if you have not already been vaccinated. Zoster (shingles) vaccine  You may need this after age 70. Pneumococcal conjugate (PCV13) vaccine  One dose is recommended after age 12. Pneumococcal polysaccharide (PPSV23) vaccine  One dose is recommended after age 104. Measles, mumps, and rubella (MMR) vaccine  You may need at least one  dose of MMR if you were born in 1957 or later. You may also need a second dose. Meningococcal conjugate (MenACWY) vaccine  You may need this if you have certain conditions. Hepatitis A vaccine  You may need this if you have certain conditions or if you travel or work in places where you may be exposed to hepatitis A. Hepatitis B vaccine  You may need this if you have certain conditions or if you travel or work in places where you may be exposed to hepatitis B. Haemophilus influenzae type b (Hib) vaccine  You may need this if you have certain conditions. You may receive vaccines as individual doses or as more than one vaccine together in one shot (combination vaccines). Talk with your health care provider about the risks and benefits of combination vaccines. What tests do I need? Blood tests  Lipid and cholesterol levels. These may be checked every 5 years, or more frequently depending on your overall health.  Hepatitis C test.  Hepatitis B test. Screening  Lung cancer screening. You may have this screening every year starting at age 50 if you have a 30-pack-year history of smoking and currently smoke or have quit within the past 15 years.  Colorectal cancer screening. All adults should have this screening starting at age 84 and continuing until age 110. Your health care provider may recommend screening at age 29 if you are at increased risk. You will have tests every 1-10 years, depending on your results and the type of screening test.  Prostate cancer screening. Recommendations will vary depending on your family history and other  risks.  Diabetes screening. This is done by checking your blood sugar (glucose) after you have not eaten for a while (fasting). You may have this done every 1-3 years.  Abdominal aortic aneurysm (AAA) screening. You may need this if you are a current or former smoker.  Sexually transmitted disease (STD) testing. Follow these instructions at home: Eating  and drinking  Eat a diet that includes fresh fruits and vegetables, whole grains, lean protein, and low-fat dairy products. Limit your intake of foods with high amounts of sugar, saturated fats, and salt.  Take vitamin and mineral supplements as recommended by your health care provider.  Do not drink alcohol if your health care provider tells you not to drink.  If you drink alcohol: ? Limit how much you have to 0-2 drinks a day. ? Be aware of how much alcohol is in your drink. In the [place] one drink equals one 12 oz bottle of beer (355 mL), one 5 oz glass of wine (148 mL), or one 1 oz glass of hard liquor (44 mL). Lifestyle  Take daily care of your teeth and gums.  Stay active. Exercise for at least 30 minutes on 5 or more days each week.  Do not use any products that contain nicotine or tobacco, such as cigarettes, e-cigarettes, and chewing tobacco. If you need help quitting, ask your health care provider.  If you are sexually active, practice safe sex. Use a condom or other form of protection to prevent STIs (sexually transmitted infections).  Talk with your health care provider about taking a low-dose aspirin or statin. What's next?  Visit your health care provider once a year for a well check visit.  Ask your health care provider how often you should have your eyes and teeth checked.  Stay up to date on all vaccines. This information is not intended to replace advice given to you by your health care provider. Make sure you discuss any questions you have with your health care provider. Document Revised: 07/11/2018 Document Reviewed: 07/11/2018 Elsevier Patient Education  [employer].   If you have lab work done today you will be contacted with your lab results within the next 2 weeks.  If you have not heard from [place] then please contact us. The fastest way to get your results is to register for My Chart.   IF you received an x-ray today, you will receive an invoice from  [facility]. Please contact [facility] at [phone] with questions or concerns regarding your invoice.   IF you received labwork today, you will receive an invoice from [place]. Please contact [facility] at [phone] with questions or concerns regarding your invoice.   Our billing staff will not be able to assist you with questions regarding bills from these companies.  You will be contacted with the lab results as soon as they are available. The fastest way to get your results is to activate your My Chart account. Instructions are located on the last page of this paperwork. If you have not heard from [place] regarding the results in 2 weeks, please contact this office.

## 2020-08-03 LAB — BASIC METABOLIC PANEL
BUN/Creatinine Ratio: 13 (ref 10–24)
BUN: 13 mg/dL (ref 8–27)
CO2: 26 mmol/L (ref 20–29)
Calcium: 9.6 mg/dL (ref 8.6–10.2)
Chloride: 102 mmol/L (ref 96–106)
Creatinine, Ser: 0.99 mg/dL (ref 0.76–1.27)
GFR calc Af Amer: 91 mL/min/{1.73_m2} (ref >59)
GFR calc non Af Amer: 79 mL/min/{1.73_m2} (ref >59)
Glucose: 83 mg/dL (ref 65–99)
Potassium: 4 mmol/L (ref 3.5–5.2)
Sodium: 141 mmol/L (ref 134–144)

## 2020-08-03 LAB — CBC
Hematocrit: 48.8 % (ref 37.5–51.0)
Hemoglobin: 17.2 g/dL (ref 13.0–17.7)
MCH: 31 pg (ref 26.6–33.0)
MCHC: 35.2 g/dL (ref 31.5–35.7)
MCV: 88 fL (ref 79–97)
Platelets: 163 10*3/uL (ref 150–450)
RBC: 5.55 x10E6/uL (ref 4.14–5.80)
RDW: 11.8 % (ref 11.6–15.4)
WBC: 6.1 10*3/uL (ref 3.4–10.8)

## 2020-08-03 LAB — TSH: TSH: 2.33 u[IU]/mL (ref 0.450–4.500)

## 2020-08-19 DIAGNOSIS — H524 Presbyopia: Secondary | ICD-10-CM | POA: Diagnosis not present

## 2020-12-03 ENCOUNTER — Ambulatory Visit: Payer: Medicare Other | Attending: Internal Medicine

## 2020-12-03 ENCOUNTER — Other Ambulatory Visit (HOSPITAL_BASED_OUTPATIENT_CLINIC_OR_DEPARTMENT_OTHER): Payer: Self-pay

## 2020-12-03 ENCOUNTER — Other Ambulatory Visit: Payer: Self-pay

## 2020-12-03 DIAGNOSIS — Z23 Encounter for immunization: Secondary | ICD-10-CM

## 2020-12-03 MED ORDER — PFIZER-BIONT COVID-19 VAC-TRIS 30 MCG/0.3ML IM SUSP
INTRAMUSCULAR | 0 refills | Status: DC
  Filled 2020-12-03: qty 0.3, 1d supply, fill #0

## 2020-12-03 NOTE — Progress Notes (Signed)
   [id number]  Name:  Nicholas West.    MRN: [id number] DOB: 01-Nov-1955  12/03/2020  [name] was observed post Covid-19 immunization for 15 minutes without incident. He was provided with Vaccine Information Sheet and instruction to access the V-Safe system.   [name] was instructed to call 911 with any severe reactions post vaccine: Marland Kitchen Difficulty breathing  . Swelling of face and throat  . A fast heartbeat  . A bad rash all over body  . Dizziness and weakness   Immunizations Administered    Name Date Dose VIS Date Route   PFIZER Comrnaty(Gray TOP) Covid-19 Vaccine 12/03/2020  3:10 PM 0.3 mL 07/08/2020 Intramuscular   Manufacturer: [place]   Lot: [id number]   NDC: [phone]

## 2021-01-17 ENCOUNTER — Telehealth: Payer: Self-pay | Admitting: Cardiology

## 2021-01-17 NOTE — Telephone Encounter (Signed)
Patient states Malachy Mood told him to speak with her if he had trouble getting in for an appointment. Patient is due at the end of September, but I did not see anything available until December. He would like a call back from [place].

## 2021-01-19 ENCOUNTER — Other Ambulatory Visit: Payer: Self-pay

## 2021-01-19 ENCOUNTER — Ambulatory Visit (INDEPENDENT_AMBULATORY_CARE_PROVIDER_SITE_OTHER): Payer: Medicare Other | Admitting: Family Medicine

## 2021-01-19 ENCOUNTER — Encounter: Payer: Self-pay | Admitting: Family Medicine

## 2021-01-19 VITALS — BP 110/76 | HR 80 | Temp 98.1°F | Ht 71.0 in | Wt 184.2 lb

## 2021-01-19 DIAGNOSIS — H6121 Impacted cerumen, right ear: Secondary | ICD-10-CM | POA: Diagnosis not present

## 2021-01-19 NOTE — Patient Instructions (Addendum)
If any return of pain in ear - be seen.  Excess earwax  was likely the main reason for the blocked ear today.  Again follow-up if any return of any pain or new symptoms.  Earwax Buildup, Adult The ears produce a substance called earwax that helps keep bacteria out of the ear and protects the skin in the ear canal. Occasionally, earwax can build upin the ear and cause discomfort or hearing loss. What are the causes? This condition is caused by a buildup of earwax. Ear canals are self-cleaning. Ear wax is made in the outer part of the ear canal and generally falls out insmall amounts over time. When the self-cleaning mechanism is not working, earwax builds up and can cause decreased hearing and discomfort. Attempting to clean ears with cotton swabs can push the earwax deep into the ear canal and cause decreased hearing andpain. What increases the risk? This condition is more likely to develop in people who: Clean their ears often with cotton swabs. Pick at their ears. Use earplugs or in-ear headphones often, or wear hearing aids. The following factors may also make you more likely to develop this condition: Being male. Being of older age. Naturally producing more earwax. Having narrow ear canals. Having earwax that is overly thick or sticky. Having excess hair in the ear canal. Having eczema. Being dehydrated. What are the signs or symptoms? Symptoms of this condition include: Reduced or muffled hearing. A feeling of fullness in the ear or feeling that the ear is plugged. Fluid coming from the ear. Ear pain or an itchy ear. Ringing in the ear. Coughing. Balance problems. An obvious piece of earwax that can be seen inside the ear canal. How is this diagnosed? This condition may be diagnosed based on: Your symptoms. Your medical history. An ear exam. During the exam, your health care provider will look into your ear with an instrument called an otoscope. You may have tests, including  a hearing test. How is this treated? This condition may be treated by: Using ear drops to soften the earwax. Having the earwax removed by a health care provider. The health care provider may: Flush the ear with water. Use an instrument that has a loop on the end (curette). Use a suction device. Having surgery to remove the wax buildup. This may be done in severe cases. Follow these instructions at home:  Take over-the-counter and prescription medicines only as told by your health care provider. Do not put any objects, including cotton swabs, into your ear. You can clean the opening of your ear canal with a washcloth or facial tissue. Follow instructions from your health care provider about cleaning your ears. Do not overclean your ears. Drink enough fluid to keep your urine pale yellow. This will help to thin the earwax. Keep all follow-up visits as told. If earwax builds up in your ears often or if you use hearing aids, consider seeing your health care provider for routine, preventive ear cleanings. Ask your health care provider how often you should schedule your cleanings. If you have hearing aids, clean them according to instructions from the manufacturer and your health care provider. Contact a health care provider if: You have ear pain. You develop a fever. You have pus or other fluid coming from your ear. You have hearing loss. You have ringing in your ears that does not go away. You feel like the room is spinning (vertigo). Your symptoms do not improve with treatment. Get help right away if:  You have bleeding from the affected ear. You have severe ear pain. Summary Earwax can build up in the ear and cause discomfort or hearing loss. The most common symptoms of this condition include reduced or muffled hearing, a feeling of fullness in the ear, or feeling that the ear is plugged. This condition may be diagnosed based on your symptoms, your medical history, and an ear exam. This  condition may be treated by using ear drops to soften the earwax or by having the earwax removed by a health care provider. Do not put any objects, including cotton swabs, into your ear. You can clean the opening of your ear canal with a washcloth or facial tissue. This information is not intended to replace advice given to you by your health care provider. Make sure you discuss any questions you have with your healthcare provider. Document Revised: 11/04/2019 Document Reviewed: 11/04/2019 Elsevier Patient Education  [place].

## 2021-01-19 NOTE — Progress Notes (Signed)
Subjective:  Patient ID: Nicholas Patten., male    DOB: 1956-06-04  Age: 65 y.o. MRN: [id number]  CC:  Chief Complaint  Patient presents with   Ear Pain    Right ear     HPI Nicholas West. presents for   R ear pain: Noted last night. Ache last night, no drainage. Feels blocked today, not sore. Tylenol once last night. No fever. No congestion, no rhinorrhea.  Feels ok otherwise. Wears hearing aids.  No treatments. Does not use cotton tip swabs to ear.  History Patient Active Problem List   Diagnosis Date Noted   Hiatal hernia 08/02/2020   Laceration of left hand without foreign body 10/11/2016   Contusion of right elbow 10/11/2016   Paroxysmal atrial fibrillation ([place]) 10/24/2012   Hypercholesterolemia 04/03/2012   ED (erectile dysfunction) 03/05/2012   Hypogonadism male 03/05/2012   Past Medical History:  Diagnosis Date   Abdominal pain    Atrial fibrillation ([place])    BCC (basal cell carcinoma) 09/14/2010   ulcerated, top right scalp, Treatment Zyclara   BCC (basal cell carcinoma) 02/05/2013   Recurrent, top right scalp, MOHs   BCC (basal cell carcinoma) 09/05/2017   Mico nod, left forehead, MOHs (Dr Link Snuffer)   Chest pain    negative stress echo in September 2013   Diverticular disease    Diverticulitis    Dysplastic nevus    moderate, Left back no treatment   GERD (gastroesophageal reflux disease)    Past Surgical History:  Procedure Laterality Date   COLON SURGERY     PARTIAL COLECTOMY  11/11   Allergies  Allergen Reactions   Penicillins     rash   Prior to Admission medications   Medication Sig Start Date End Date Taking? Authorizing Provider  aspirin 81 MG tablet Take 81 mg by mouth daily.   Yes [provider]  B-D 3CC LUER-LOK SYR 23GX1" 23G X 1" 3 ML MISC SMARTSIG:1 Injection Once a Week 02/06/20  Yes [provider]  B-D HYPODERMIC NEEDLE 18GX1.5" 18G X 1-1/2" MISC USE TO DRAW UP MEDICATION 04/01/20  Yes [provider]  calcium carbonate (OS-CAL - DOSED IN MG OF ELEMENTAL CALCIUM) 1250 MG tablet Take 2 tablets by mouth 2 (two) times daily with a meal.    Yes [provider]  cholecalciferol (VITAMIN D) 1000 UNITS tablet Take 1,000 Units by mouth 2 (two) times daily.   Yes [provider]  COVID-19 mRNA Vac-TriS, Pfizer, (PFIZER-BIONT COVID-19 VAC-TRIS) SUSP injection Inject into the muscle. 12/03/20  Yes Carlyle Basques, MD  diltiazem [facility] CD) 240 MG 24 hr capsule TAKE 1 CAPSULE(240 MG) BY MOUTH DAILY 05/13/20  Yes Martinique, Peter M, MD  Docusate Sodium (COLACE PO) Take 1 tablet by mouth as directed.   Yes [provider]  Multiple Vitamin (MULTIVITAMIN) tablet Take 1 tablet by mouth daily.   Yes [provider]  Omega-3 Fatty Acids (OMEGA 3 PO) Take 2 capsules by mouth daily.    Yes [provider]  omeprazole (PRILOSEC) 20 MG capsule Take 1 capsule (20 mg total) by mouth daily. 03/05/12  Yes [name], MD  Probiotic Product (PROBIOTIC-10 PO) Take 1 tablet by mouth daily.   Yes [provider]  sildenafil (REVATIO) 20 MG tablet Take by mouth. Take 1 to 5 tabs by mouth as needed for erectile dysfunction   Yes [provider]  tacrolimus (PROTOPIC) 0.1 % ointment Apply 1 application topically daily. 06/09/20  Yes Lavonna Monarch, MD  testosterone cypionate (DEPOTESTOSTERONE CYPIONATE) 200 MG/ML injection Inject into the muscle once a week.    Yes [provider]   Social History   Socioeconomic History   Marital status: Married    Spouse name: Not on file   Number of children: 0   Years of education: Not on file   Highest education level: Not on file  Occupational History   Occupation: attorney    Employer: [employer]  Tobacco Use   Smoking status: Never   Smokeless tobacco: Never  Substance and Sexual Activity   Alcohol use: No   Drug use: No   Sexual activity: Yes  Other Topics Concern   Not on file   Social History Narrative   Married   Exercise: Yes   Education: Secretary/administrator   Social Determinants of Health   Financial Resource Strain: Not on file  Food Insecurity: Not on file  Transportation Needs: Not on file  Physical Activity: Not on file  Stress: Not on file  Social Connections: Not on file  Intimate Partner Violence: Not on file    Review of Systems  Per HPI.  Objective:   Vitals:   01/19/21 1257  BP: 110/76  Pulse: 80  Temp: 98.1 F (36.7 C)  SpO2: 96%  Weight: 184 lb 3.2 oz (83.6 kg)  Height: 5\' 11"  (1.803 m)     Physical Exam Constitutional:      General: He is not in acute distress.    Appearance: Normal appearance. He is well-developed.  HENT:     Head: Normocephalic and atraumatic.     Right Ear: External ear normal. There is impacted cerumen (no d/c, erythema or canal edema. nontender with palpation, traction  of pinna.).     Left Ear: Tympanic membrane, ear canal and external ear normal.  Cardiovascular:     Rate and Rhythm: Normal rate.  Pulmonary:     Effort: Pulmonary effort is normal.  Neurological:     Mental Status: He is alert and oriented to person, place, and time.  Psychiatric:        Mood and Affect: Mood normal.    Cerumen lavage performed on right side with removal of excess cerumen.  After procedure canals intact without erythema or edema, no abrasions or wounds.  TM appears to be pearly gray with minimal injection but no apparent effusion.  Does not appear to be bulging or retracting.  No surrounding lymphadenopathy.   Assessment & Plan:  Nicholas West. is a 65 y.o. male . Impacted cerumen of right ear - Plan: Ear wax removal -Improved after cerumen disimpaction/lavage.  Denies current pain.  RTC precautions if pain returns.  No orders of the defined types were placed in this encounter.  Patient Instructions  If any return of pain in ear - be seen.  Excess earwax  was likely the main reason for the blocked ear today.   Again follow-up if any return of any pain or new symptoms.  Earwax Buildup, Adult The ears produce a substance called earwax that helps keep bacteria out of the ear and protects the skin in the ear canal. Occasionally, earwax can build upin the ear and cause discomfort or hearing loss. What are the causes? This condition is caused by a buildup of earwax. Ear canals are self-cleaning. Ear wax is made in the outer part of the ear canal and generally falls out insmall amounts over time. When the self-cleaning mechanism is not  working, earwax builds up and can cause decreased hearing and discomfort. Attempting to clean ears with cotton swabs can push the earwax deep into the ear canal and cause decreased hearing andpain. What increases the risk? This condition is more likely to develop in people who: Clean their ears often with cotton swabs. Pick at their ears. Use earplugs or in-ear headphones often, or wear hearing aids. The following factors may also make you more likely to develop this condition: Being male. Being of older age. Naturally producing more earwax. Having narrow ear canals. Having earwax that is overly thick or sticky. Having excess hair in the ear canal. Having eczema. Being dehydrated. What are the signs or symptoms? Symptoms of this condition include: Reduced or muffled hearing. A feeling of fullness in the ear or feeling that the ear is plugged. Fluid coming from the ear. Ear pain or an itchy ear. Ringing in the ear. Coughing. Balance problems. An obvious piece of earwax that can be seen inside the ear canal. How is this diagnosed? This condition may be diagnosed based on: Your symptoms. Your medical history. An ear exam. During the exam, your health care provider will look into your ear with an instrument called an otoscope. You may have tests, including a hearing test. How is this treated? This condition may be treated by: Using ear drops to soften the  earwax. Having the earwax removed by a health care provider. The health care provider may: Flush the ear with water. Use an instrument that has a loop on the end (curette). Use a suction device. Having surgery to remove the wax buildup. This may be done in severe cases. Follow these instructions at home:  Take over-the-counter and prescription medicines only as told by your health care provider. Do not put any objects, including cotton swabs, into your ear. You can clean the opening of your ear canal with a washcloth or facial tissue. Follow instructions from your health care provider about cleaning your ears. Do not overclean your ears. Drink enough fluid to keep your urine pale yellow. This will help to thin the earwax. Keep all follow-up visits as told. If earwax builds up in your ears often or if you use hearing aids, consider seeing your health care provider for routine, preventive ear cleanings. Ask your health care provider how often you should schedule your cleanings. If you have hearing aids, clean them according to instructions from the manufacturer and your health care provider. Contact a health care provider if: You have ear pain. You develop a fever. You have pus or other fluid coming from your ear. You have hearing loss. You have ringing in your ears that does not go away. You feel like the room is spinning (vertigo). Your symptoms do not improve with treatment. Get help right away if: You have bleeding from the affected ear. You have severe ear pain. Summary Earwax can build up in the ear and cause discomfort or hearing loss. The most common symptoms of this condition include reduced or muffled hearing, a feeling of fullness in the ear, or feeling that the ear is plugged. This condition may be diagnosed based on your symptoms, your medical history, and an ear exam. This condition may be treated by using ear drops to soften the earwax or by having the earwax removed by a  health care provider. Do not put any objects, including cotton swabs, into your ear. You can clean the opening of your ear canal with a washcloth or facial tissue.  This information is not intended to replace advice given to you by your health care provider. Make sure you discuss any questions you have with your healthcare provider. Document Revised: 11/04/2019 Document Reviewed: 11/04/2019 Elsevier Patient Education  2022 [place],   Merri Ray, MD [facility] 01/19/21 1:46 PM

## 2021-01-19 NOTE — Telephone Encounter (Signed)
Called patient left message on personal voice mail I was calling to schedule follow up appointment with Dr.Jordan.Advised to call me back on Friday 01/21/21.

## 2021-01-21 NOTE — Telephone Encounter (Signed)
Spoke to patient follow up appointment scheduled with Dr.Jordan 10/10 at 9:40 am.

## 2021-02-16 ENCOUNTER — Encounter (HOSPITAL_COMMUNITY): Payer: Self-pay | Admitting: Emergency Medicine

## 2021-02-16 ENCOUNTER — Ambulatory Visit (HOSPITAL_COMMUNITY)
Admission: EM | Admit: 2021-02-16 | Discharge: 2021-02-16 | Payer: Medicare Other | Attending: Internal Medicine | Admitting: Internal Medicine

## 2021-02-16 ENCOUNTER — Emergency Department (HOSPITAL_COMMUNITY)
Admission: EM | Admit: 2021-02-16 | Discharge: 2021-02-16 | Disposition: A | Discharge status: Home / Self Care | Payer: Medicare Other | Attending: Emergency Medicine | Admitting: Emergency Medicine

## 2021-02-16 ENCOUNTER — Other Ambulatory Visit: Payer: Self-pay

## 2021-02-16 ENCOUNTER — Emergency Department (HOSPITAL_COMMUNITY): Payer: Medicare Other

## 2021-02-16 ENCOUNTER — Encounter (HOSPITAL_COMMUNITY): Payer: Self-pay

## 2021-02-16 DIAGNOSIS — R109 Unspecified abdominal pain: Secondary | ICD-10-CM | POA: Diagnosis not present

## 2021-02-16 DIAGNOSIS — R1030 Lower abdominal pain, unspecified: Secondary | ICD-10-CM

## 2021-02-16 DIAGNOSIS — I4892 Unspecified atrial flutter: Secondary | ICD-10-CM | POA: Diagnosis not present

## 2021-02-16 DIAGNOSIS — D72829 Elevated white blood cell count, unspecified: Secondary | ICD-10-CM | POA: Insufficient documentation

## 2021-02-16 DIAGNOSIS — I700 Atherosclerosis of aorta: Secondary | ICD-10-CM | POA: Diagnosis not present

## 2021-02-16 DIAGNOSIS — Z79899 Other long term (current) drug therapy: Secondary | ICD-10-CM | POA: Diagnosis not present

## 2021-02-16 DIAGNOSIS — Z7982 Long term (current) use of aspirin: Secondary | ICD-10-CM | POA: Insufficient documentation

## 2021-02-16 DIAGNOSIS — K5732 Diverticulitis of large intestine without perforation or abscess without bleeding: Secondary | ICD-10-CM

## 2021-02-16 DIAGNOSIS — K5792 Diverticulitis of intestine, part unspecified, without perforation or abscess without bleeding: Secondary | ICD-10-CM

## 2021-02-16 DIAGNOSIS — R000 Tachycardia, unspecified: Secondary | ICD-10-CM | POA: Diagnosis not present

## 2021-02-16 DIAGNOSIS — Z20822 Contact with and (suspected) exposure to covid-19: Secondary | ICD-10-CM | POA: Diagnosis not present

## 2021-02-16 LAB — CBC
HCT: 48.4 % (ref 39.0–52.0)
Hemoglobin: 16.7 g/dL (ref 13.0–17.0)
MCH: 30.6 pg (ref 26.0–34.0)
MCHC: 34.5 g/dL (ref 30.0–36.0)
MCV: 88.8 fL (ref 80.0–100.0)
Platelets: 163 10*3/uL (ref 150–400)
RBC: 5.45 MIL/uL (ref 4.22–5.81)
RDW: 11.9 % (ref 11.5–15.5)
WBC: 14.4 10*3/uL — ABNORMAL HIGH (ref 4.0–10.5)
nRBC: 0 % (ref 0.0–0.2)

## 2021-02-16 LAB — DIFFERENTIAL
Abs Immature Granulocytes: 0.04 10*3/uL (ref 0.00–0.07)
Basophils Absolute: 0 10*3/uL (ref 0.0–0.1)
Basophils Relative: 0 %
Eosinophils Absolute: 0 10*3/uL (ref 0.0–0.5)
Eosinophils Relative: 0 %
Immature Granulocytes: 0 %
Lymphocytes Relative: 7 %
Lymphs Abs: 1.1 10*3/uL (ref 0.7–4.0)
Monocytes Absolute: 1.4 10*3/uL — ABNORMAL HIGH (ref 0.1–1.0)
Monocytes Relative: 9 %
Neutro Abs: 12.4 10*3/uL — ABNORMAL HIGH (ref 1.7–7.7)
Neutrophils Relative %: 84 %

## 2021-02-16 LAB — COMPREHENSIVE METABOLIC PANEL
ALT: 30 U/L (ref 0–44)
AST: 29 U/L (ref 15–41)
Albumin: 4.4 g/dL (ref 3.5–5.0)
Alkaline Phosphatase: 60 U/L (ref 38–126)
Anion gap: 11 (ref 5–15)
BUN: 11 mg/dL (ref 8–23)
CO2: 27 mmol/L (ref 22–32)
Calcium: 9.4 mg/dL (ref 8.9–10.3)
Chloride: 100 mmol/L (ref 98–111)
Creatinine, Ser: 1.01 mg/dL (ref 0.61–1.24)
GFR, Estimated: >60 mL/min (ref >60)
Glucose, Bld: 96 mg/dL (ref 70–99)
Potassium: 3.9 mmol/L (ref 3.5–5.1)
Sodium: 138 mmol/L (ref 135–145)
Total Bilirubin: 1.5 mg/dL — ABNORMAL HIGH (ref 0.3–1.2)
Total Protein: 7.4 g/dL (ref 6.5–8.1)

## 2021-02-16 LAB — URINALYSIS, ROUTINE W REFLEX MICROSCOPIC
Bilirubin Urine: NEGATIVE
Glucose, UA: NEGATIVE mg/dL
Hgb urine dipstick: NEGATIVE
Ketones, ur: 20 mg/dL — AB
Leukocytes,Ua: NEGATIVE
Nitrite: NEGATIVE
Protein, ur: NEGATIVE mg/dL
Specific Gravity, Urine: 1.024 (ref 1.005–1.030)
pH: 5 (ref 5.0–8.0)

## 2021-02-16 LAB — LACTIC ACID, PLASMA: Lactic Acid, Venous: 1.4 mmol/L (ref 0.5–1.9)

## 2021-02-16 LAB — RESP PANEL BY RT-PCR (FLU A&B, COVID) ARPGX2
Influenza A by PCR: NEGATIVE
Influenza B by PCR: NEGATIVE
SARS Coronavirus 2 by RT PCR: NEGATIVE

## 2021-02-16 LAB — LIPASE, BLOOD: Lipase: 31 U/L (ref 11–51)

## 2021-02-16 IMAGING — CT CT ABD-PELV W/ CM
2 of 5 series · 16 of 46 positions shown, 18 images · IV contrast (APPLIED)
Comparison: [DATE]

CLINICAL DATA: Right lower quadrant abdominal pain, fatigue, chills

EXAM:
CT ABDOMEN AND PELVIS WITH CONTRAST
TECHNIQUE: Multidetector CT imaging of the abdomen and pelvis was performed
using the standard protocol following bolus administration of
intravenous contrast.
CONTRAST:  100mL OMNIPAQUE IOHEXOL 300 MG/ML  SOLN

[Series 3: abdomen 5.0 · axial · 0.73mm/px · z∈[-482,-87]mm · 13 of 93 slices shown, 15 images]
[im 7/93  soft-tissue]
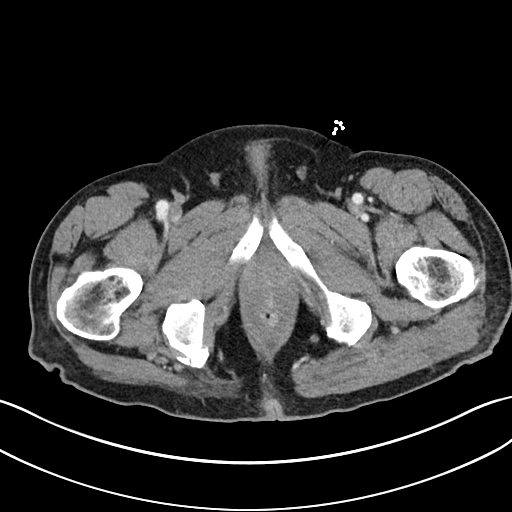
[im 7/93  bone]
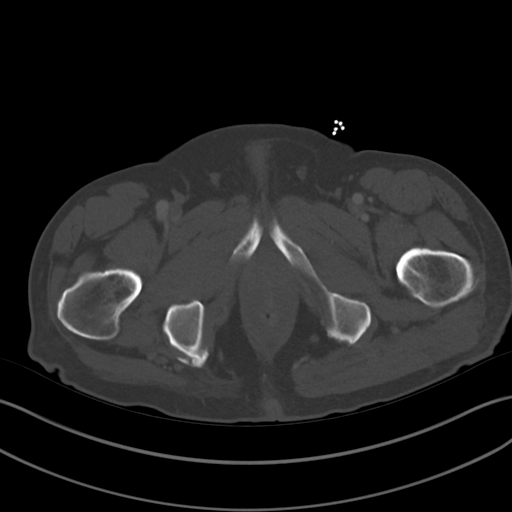
[im 14/93  soft-tissue]
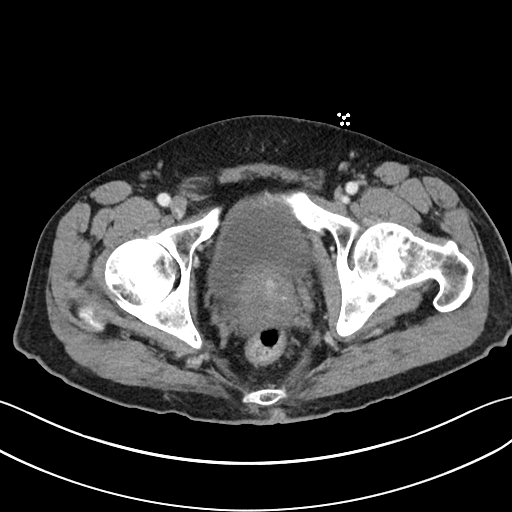
[im 20/93  soft-tissue]
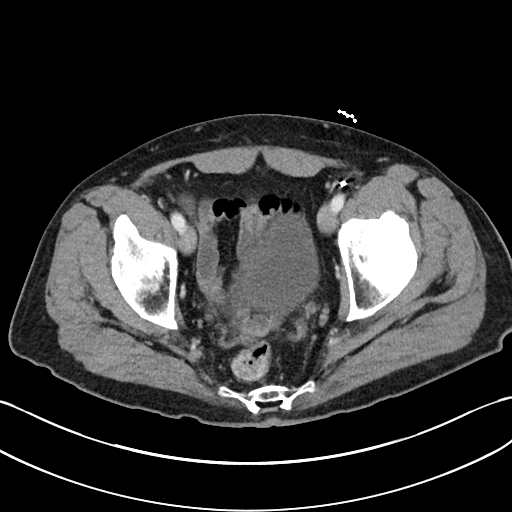
[im 27/93  soft-tissue]
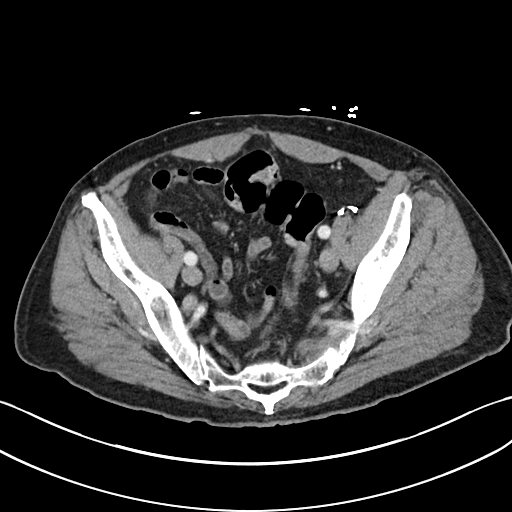
[im 33/93  soft-tissue]
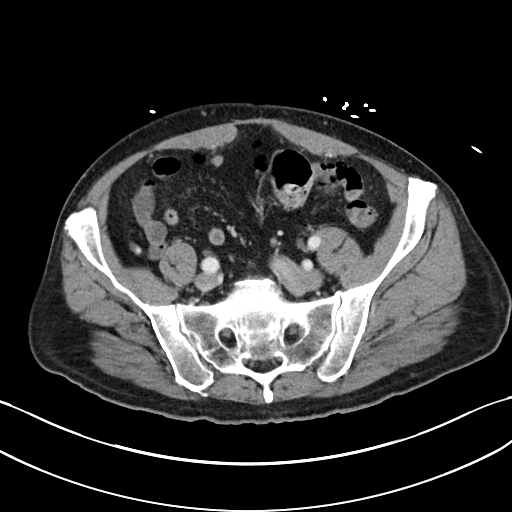
[im 40/93  soft-tissue]
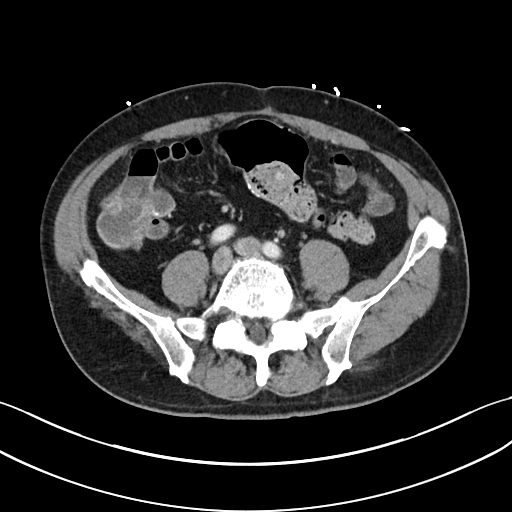
[im 47/93  soft-tissue]
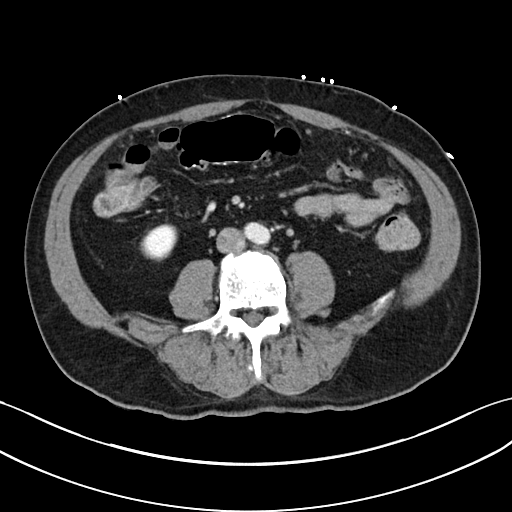
[im 53/93  soft-tissue]
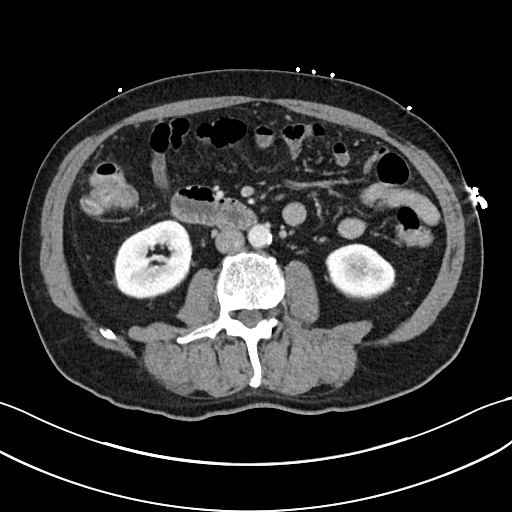
[im 60/93  soft-tissue]
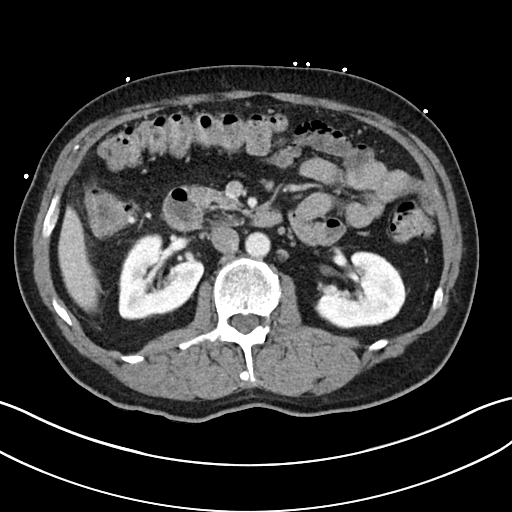
[im 60/93  bone]
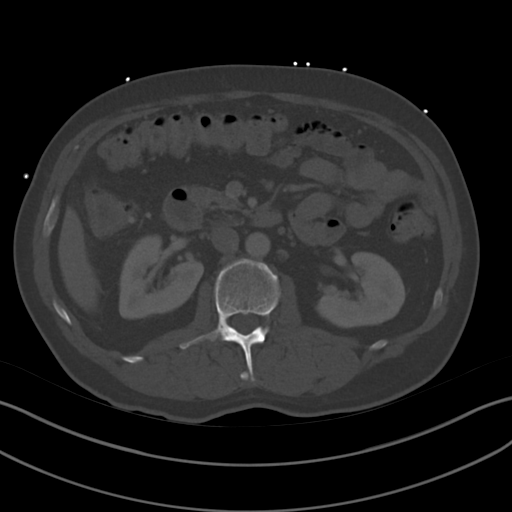
[im 66/93  soft-tissue]
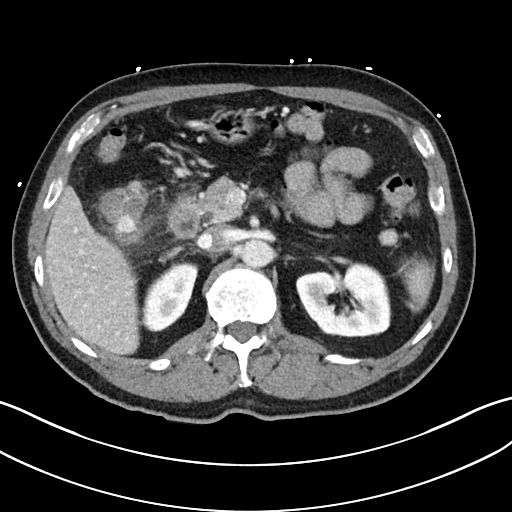
[im 73/93  soft-tissue]
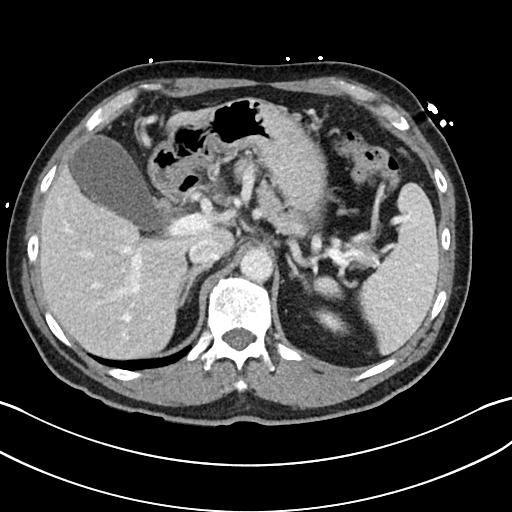
[im 79/93  soft-tissue]
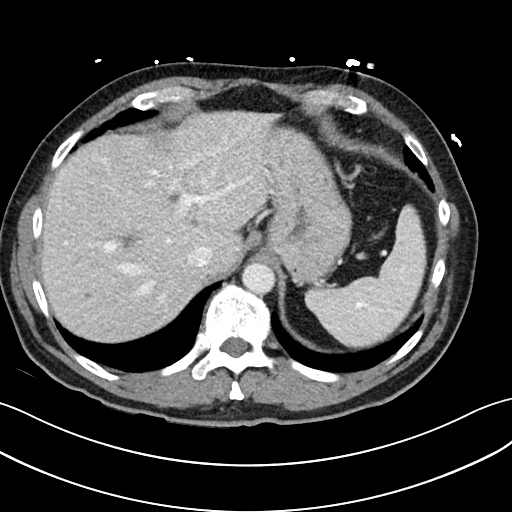
[im 86/93  soft-tissue]
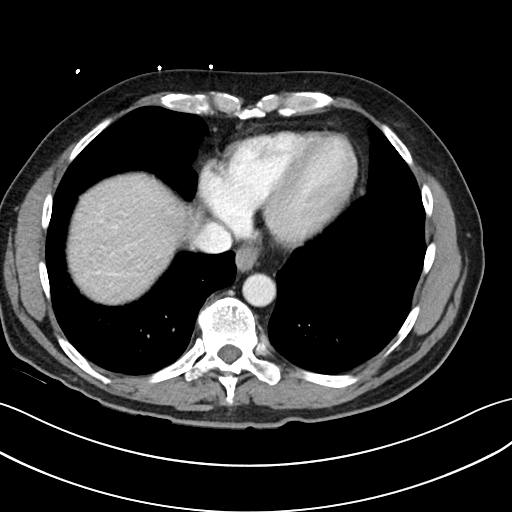

[Series 6: abdomen 3.0 mpr cor · coronal · 0.75mm/px · 3 of 93 slices shown]
[im 31/93  soft-tissue]
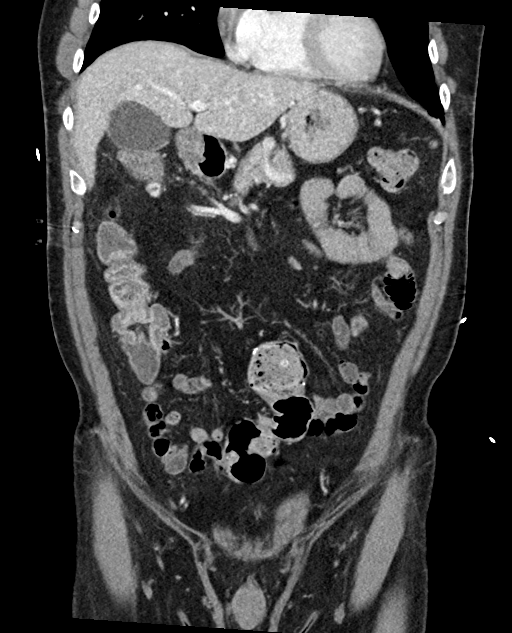
[im 41/93  soft-tissue]
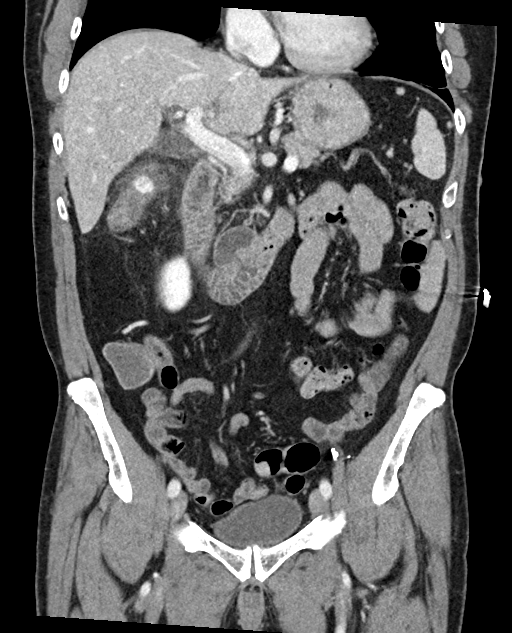
[im 52/93  soft-tissue]
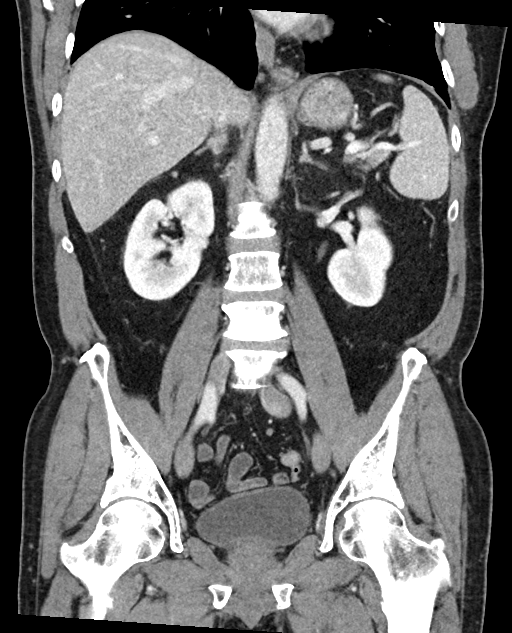

[16 of 46 positions shown; findings below may reference images not displayed]

FINDINGS: Lower chest: No acute pleural or parenchymal lung disease.

Hepatobiliary: No focal liver abnormality is seen. No gallstones,
gallbladder wall thickening, or biliary dilatation.

Pancreas: Unremarkable. No pancreatic ductal dilatation or
surrounding inflammatory changes.

Spleen: Normal in size without focal abnormality.

Adrenals/Urinary Tract: Adrenal glands are unremarkable. Kidneys are
normal, without renal calculi, focal lesion, or hydronephrosis.
Bladder is unremarkable.

Stomach/Bowel: No bowel obstruction or ileus. Normal appendix right
lower quadrant.

There is diverticulosis of the colon, with inflammatory changes
surrounding the hepatic flexure consistent with acute uncomplicated
diverticulitis. No perforation, fluid collection, or abscess.

Vascular/Lymphatic: Aortic atherosclerosis. No enlarged abdominal or
pelvic lymph nodes.

Reproductive: Prostate is unremarkable.

Other: No free fluid or free gas.  No abdominal wall hernia.

Musculoskeletal: No acute or destructive bony lesions. Prominent
spondylosis at L4-5. Reconstructed images demonstrate no additional
findings.
IMPRESSION: 1. Acute uncomplicated diverticulitis at the hepatic flexure of the
colon. No perforation, fluid collection, or abscess.
2. Normal appendix.
3.  Aortic Atherosclerosis ([H3]-[H3]).

## 2021-02-16 MED ORDER — CIPROFLOXACIN HCL 500 MG PO TABS: 500.0000 mg | ORAL_TABLET | Freq: Two times a day (BID) | ORAL | 0 refills | Status: DC

## 2021-02-16 MED ORDER — IOHEXOL 300 MG/ML  SOLN
100.0000 mL | Freq: Once | INTRAMUSCULAR | Status: AC | PRN
  Administered 2021-02-16: 100 mL via INTRAVENOUS

## 2021-02-16 MED ORDER — DILTIAZEM HCL ER COATED BEADS 240 MG PO CP24
240.0000 mg | ORAL_CAPSULE | Freq: Once | ORAL | Status: AC
  Administered 2021-02-16: 240 mg via ORAL
  Filled 2021-02-16: qty 1

## 2021-02-16 MED ORDER — METRONIDAZOLE 500 MG PO TABS: 500.0000 mg | ORAL_TABLET | Freq: Three times a day (TID) | ORAL | 0 refills | Status: AC

## 2021-02-16 MED ORDER — SODIUM CHLORIDE 0.9 % IV BOLUS
1000.0000 mL | Freq: Once | INTRAVENOUS | Status: AC
  Administered 2021-02-16: 1000 mL via INTRAVENOUS

## 2021-02-16 NOTE — ED Notes (Signed)
Pt went into A fib while I was in room. No sx r/t A fib. EKG captured and shown to PA. No new orders currently.

## 2021-02-16 NOTE — ED Triage Notes (Signed)
Pt presents with generalized body aches, fatigue, weakness, chills, and abdominal pain since yesterday.

## 2021-02-16 NOTE — Discharge Instructions (Addendum)
Patient is advised to go to the emergency department giving his symptoms.  He will benefit from a CT scan of the abdomen, fluids and antibiotics.

## 2021-02-16 NOTE — ED Notes (Signed)
Patient transported to CT 

## 2021-02-16 NOTE — ED Notes (Signed)
Patient is being discharged from the Urgent Care and sent to the Emergency Department via personal vehicle with spouse . Per Dr Lanny Cramp, patient is in need of higher level of care due to severe diverticulitis flare. Patient is aware and verbalizes understanding of plan of care.   Vitals:   02/16/21 0915  BP: 121/80  Pulse: 89  Resp: 17  Temp: (!) 102.9 F (39.4 C)  SpO2: 93%

## 2021-02-16 NOTE — ED Notes (Signed)
Pt ambulatory to restroom. Steady on feet. Had a normal BM per pt.

## 2021-02-16 NOTE — Discharge Instructions (Addendum)
Please pick up antibiotics and take as prescribed for diverticulitis  I would recommend following up with Dr. Michail Sermon for further evaluation  Return to the ED for any new/worsening symptoms including worsening pain, no improvement within 48 hours on antibiotics, or any other/worsening symptoms

## 2021-02-16 NOTE — ED Triage Notes (Signed)
Patient complains of lower abdominal pain and constipation that started yesterday. Patient states pain is exacerbate by laying on his right side, pain is unchanged by laying on left side. Patient alert, oriented, and in no apparent distress at his time.

## 2021-02-16 NOTE — ED Provider Notes (Signed)
[facility] EMERGENCY DEPARTMENT Provider Note   CSN: [id number] Arrival date & time: 02/16/21  9323     History Chief Complaint  Patient presents with   Abdominal Pain    Nicholas West. is a 65 y.o. male with PMHx Hypercholesterolemia, GERD, A fib (rate controlled, not anticoagulated) who presents to the ED today with complaint of gradual onset, intermittent, lower abdominal pain that began 2 days ago.  Reports that 2 days ago he went and laid on his right side when he started noticing pain in his abdomen.  He turned over and laid on his left side and states that he did not have any pain.  Since then he has had intermittent sharp sensation in his lower abdomen however currently he has none.  He denies any nausea, vomiting, diarrhea and last had a movement yesterday however he does state that it is been quite small recently and his last good bowel movement was approximately 3 days ago.  He was unaware that he had a fever until he came to the ED today, found to be febrile at 101.2.  He denies any recent sick contacts.  Denies any cough, body aches, any other URI-like complaints.  Surgical history does include sigmoid colectomy secondary to multiple bouts of diverticulitis.   The history is provided by the patient and medical records.      Past Medical History:  Diagnosis Date   Abdominal pain    Atrial fibrillation ([place])    BCC (basal cell carcinoma) 09/14/2010   ulcerated, top right scalp, Treatment Zyclara   BCC (basal cell carcinoma) 02/05/2013   Recurrent, top right scalp, MOHs   BCC (basal cell carcinoma) 09/05/2017   Mico nod, left forehead, MOHs (Dr Link Snuffer)   Chest pain    negative stress echo in September 2013   Diverticular disease    Diverticulitis    Dysplastic nevus    moderate, Left back no treatment   GERD (gastroesophageal reflux disease)     Patient Active Problem List   Diagnosis Date Noted   Hiatal hernia 08/02/2020   Laceration of  left hand without foreign body 10/11/2016   Contusion of right elbow 10/11/2016   Paroxysmal atrial fibrillation ([place]) 10/24/2012   Hypercholesterolemia 04/03/2012   ED (erectile dysfunction) 03/05/2012   Hypogonadism male 03/05/2012    Past Surgical History:  Procedure Laterality Date   COLON SURGERY     PARTIAL COLECTOMY  11/11       Family History  Problem Relation Age of Onset   Cancer Mother    Alzheimer's disease Father    Colon polyps Father    GER disease Father    Heart disease Paternal Grandmother    Cancer Paternal Grandfather     Social History   Tobacco Use   Smoking status: Never   Smokeless tobacco: Never  Substance Use Topics   Alcohol use: No   Drug use: No    Home Medications Prior to Admission medications   Medication Sig Start Date End Date Taking? Authorizing Provider  ciprofloxacin (CIPRO) 500 MG tablet Take 1 tablet (500 mg total) by mouth 2 (two) times daily. 02/16/21  Yes Lavell Supple, PA-C  metroNIDAZOLE (FLAGYL) 500 MG tablet Take 1 tablet (500 mg total) by mouth 3 (three) times daily for 7 days. 02/16/21 02/23/21 Yes Rizwan Kuyper, PA-C  aspirin 81 MG tablet Take 81 mg by mouth daily.    [provider]  B-D 3CC LUER-LOK SYR 23GX1" 23G X  1" 3 ML MISC SMARTSIG:1 Injection Once a Week 02/06/20   [provider]  B-D HYPODERMIC NEEDLE 18GX1.5" 18G X 1-1/2" MISC USE TO DRAW UP MEDICATION 04/01/20   [provider]  calcium carbonate (OS-CAL - DOSED IN MG OF ELEMENTAL CALCIUM) 1250 MG tablet Take 2 tablets by mouth 2 (two) times daily with a meal.     [provider]  cholecalciferol (VITAMIN D) 1000 UNITS tablet Take 1,000 Units by mouth 2 (two) times daily.    [provider]  COVID-19 mRNA Vac-TriS, Pfizer, (PFIZER-BIONT COVID-19 VAC-TRIS) SUSP injection Inject into the muscle. 12/03/20   Carlyle Basques, MD  diltiazem [facility] CD) 240 MG 24 hr capsule TAKE 1 CAPSULE(240 MG) BY MOUTH DAILY 05/13/20    Martinique, Peter M, MD  Docusate Sodium (COLACE PO) Take 1 tablet by mouth as directed.    [provider]  Multiple Vitamin (MULTIVITAMIN) tablet Take 1 tablet by mouth daily.    [provider]  Omega-3 Fatty Acids (OMEGA 3 PO) Take 2 capsules by mouth daily.     [provider]  omeprazole (PRILOSEC) 20 MG capsule Take 1 capsule (20 mg total) by mouth daily. 03/05/12   [name], MD  Probiotic Product (PROBIOTIC-10 PO) Take 1 tablet by mouth daily.    [provider]  sildenafil (REVATIO) 20 MG tablet Take by mouth. Take 1 to 5 tabs by mouth as needed for erectile dysfunction    [provider]  tacrolimus (PROTOPIC) 0.1 % ointment Apply 1 application topically daily. 06/09/20   Lavonna Monarch, MD  testosterone cypionate (DEPOTESTOSTERONE CYPIONATE) 200 MG/ML injection Inject into the muscle once a week.     [provider]    Allergies    Penicillins  Review of Systems   Review of Systems  Constitutional:  Positive for fever. Negative for chills.  Respiratory:  Negative for cough and shortness of breath.   Cardiovascular:  Negative for chest pain.  Gastrointestinal:  Positive for abdominal pain and constipation. Negative for diarrhea, nausea and vomiting.  Musculoskeletal:  Negative for myalgias.  All other systems reviewed and are negative.  Physical Exam Updated Vital Signs BP (!) 130/91   Pulse 95   Temp 98.8 F (37.1 C) (Oral)   Resp 18   Ht 6\' 1"  (1.854 m)   Wt 82.1 kg   SpO2 97%   BMI 23.88 kg/m   Physical Exam Vitals and nursing note reviewed.  Constitutional:      Appearance: He is not ill-appearing or diaphoretic.     Comments: Repeat temp 99.1  HENT:     Head: Normocephalic and atraumatic.  Eyes:     Conjunctiva/sclera: Conjunctivae normal.  Cardiovascular:     Rate and Rhythm: Normal rate and regular rhythm.     Heart sounds: Normal heart sounds.  Pulmonary:     Effort: Pulmonary effort is normal.      Breath sounds: Normal breath sounds. No wheezing, rhonchi or rales.  Abdominal:     General: Abdomen is flat.     Palpations: Abdomen is soft.     Tenderness: There is no abdominal tenderness. There is no right CVA tenderness, left CVA tenderness, guarding or rebound.  Musculoskeletal:     Cervical back: Neck supple.  Skin:    General: Skin is warm and dry.  Neurological:     Mental Status: He is alert.    ED Results / Procedures / Treatments   Labs (all labs ordered are  listed, but only abnormal results are displayed) Labs Reviewed  COMPREHENSIVE METABOLIC PANEL - Abnormal; Notable for the following components:      Result Value   Total Bilirubin 1.5 (*)    All other components within normal limits  CBC - Abnormal; Notable for the following components:   WBC 14.4 (*)    All other components within normal limits  URINALYSIS, ROUTINE W REFLEX MICROSCOPIC - Abnormal; Notable for the following components:   Color, Urine AMBER (*)    Ketones, ur 20 (*)    All other components within normal limits  RESP PANEL BY RT-PCR (FLU A&B, COVID) ARPGX2  LIPASE, BLOOD  LACTIC ACID, PLASMA  DIFFERENTIAL    EKG None  Radiology CT ABDOMEN PELVIS W CONTRAST  Result Date: 02/16/2021 CLINICAL DATA:  Right lower quadrant abdominal pain, fatigue, chills EXAM: CT ABDOMEN AND PELVIS WITH CONTRAST TECHNIQUE: Multidetector CT imaging of the abdomen and pelvis was performed using the standard protocol following bolus administration of intravenous contrast. CONTRAST:  11mL OMNIPAQUE IOHEXOL 300 MG/ML  SOLN COMPARISON:  03/18/2010 FINDINGS: Lower chest: No acute pleural or parenchymal lung disease. Hepatobiliary: No focal liver abnormality is seen. No gallstones, gallbladder wall thickening, or biliary dilatation. Pancreas: Unremarkable. No pancreatic ductal dilatation or surrounding inflammatory changes. Spleen: Normal in size without focal abnormality. Adrenals/Urinary Tract: Adrenal glands are  unremarkable. Kidneys are normal, without renal calculi, focal lesion, or hydronephrosis. Bladder is unremarkable. Stomach/Bowel: No bowel obstruction or ileus. Normal appendix right lower quadrant. There is diverticulosis of the colon, with inflammatory changes surrounding the hepatic flexure consistent with acute uncomplicated diverticulitis. No perforation, fluid collection, or abscess. Vascular/Lymphatic: Aortic atherosclerosis. No enlarged abdominal or pelvic lymph nodes. Reproductive: Prostate is unremarkable. Other: No free fluid or free gas.  No abdominal wall hernia. Musculoskeletal: No acute or destructive bony lesions. Prominent spondylosis at L4-5. Reconstructed images demonstrate no additional findings. IMPRESSION: 1. Acute uncomplicated diverticulitis at the hepatic flexure of the colon. No perforation, fluid collection, or abscess. 2. Normal appendix. 3.  Aortic Atherosclerosis (ICD10-I70.0). Electronically Signed   By: Randa Ngo M.D.   On: 02/16/2021 19:34    Procedures Procedures   Medications Ordered in ED Medications  sodium chloride 0.9 % bolus 1,000 mL (0 mLs Intravenous Stopped 02/16/21 1809)  diltiazem (CARDIZEM CD) 24 hr capsule 240 mg (240 mg Oral Given 02/16/21 1810)  iohexol (OMNIPAQUE) 300 MG/ML solution 100 mL (100 mLs Intravenous Contrast Given 02/16/21 1923)    ED Course  I have reviewed the triage vital signs and the nursing notes.  Pertinent labs & imaging results that were available during my care of the patient were reviewed by me and considered in my medical decision making (see chart for details).    MDM Rules/Calculators/A&P                           65 year old male who presents to the ED today with intermittent episodes of diffuse lower abdominal pain for the past 2 days.  No other associated symptoms however has had small amounts of constipation over the last couple days as well.  He is still passing gas.  On arrival to the ED today patient was found  to be febrile at one 1.2.  Remainder of vitals unremarkable.  Work-up started in the waiting room including a CT scan was ordered.  When he is brought back to the room he states he is no longer having any pain.  It is  noticed that while in triage she had right lower quadrant abdominal tenderness palpation however on my exam he has no abdominal tenderness palpation whatsoever.  There is concern for possible appendicitis versus other acute intra-abdominal pathology.  Patient has a history of multiple episodes of diverticulitis in the past and subsequently underwent a sigmoid colectomy due to same.  He states this feels different.  Is not provided with any Tylenol in the waiting room however repeat temperature in the room 99.1.  His CBC does show leukocytosis of 14,000.  Not on differential at this time.  CMP with slight elevated T bili at 1.5.  Lipase within normal limits at 31.  Urinalysis without signs of infection.  Pending CT scan at this time to assess for acute abdomen.  We will subsequently get a COVID test in case patient requires surgery.  We will also add on a lactic acid given elevated temperature on arrival to the ED today.  We will hold off on pain meds at this time however we will plan for fluids.  KG obtained as patient has history of A. fib, normal sinus rhythm initially however while in the ED patient did land himself in A. fib again.  Asymptomatic.  Heart rate at 126.  He does report he did not take his diltiazem earlier today, will provide in the ED today.   HR has improved after diltiazem.   CT scan: IMPRESSION:  1. Acute uncomplicated diverticulitis at the hepatic flexure of the  colon. No perforation, fluid collection, or abscess.  2. Normal appendix.  3.  Aortic Atherosclerosis (ICD10-I70.0).   Given age, leukocytosis, and fever in the ED will treat outpatient with oral antibiotics. Pt has PCN allergy, will stick with cipro and flagyl for now. Pt advised to follow up with is GI  doctor for further eval. He is in agreement with plan and stable for discharge home.   This note was prepared using Dragon voice recognition software and may include unintentional dictation errors due to the inherent limitations of voice recognition software.   Final Clinical Impression(s) / ED Diagnoses Final diagnoses:  Diverticulitis  Lower abdominal pain    Rx / DC Orders ED Discharge Orders          Ordered    ciprofloxacin (CIPRO) 500 MG tablet  2 times daily        02/16/21 1955    metroNIDAZOLE (FLAGYL) 500 MG tablet  3 times daily        02/16/21 1955             Discharge Instructions      Please pick up antibiotics and take as prescribed for diverticulitis  I would recommend following up with Dr. Michail Sermon for further evaluation  Return to the ED for any new/worsening symptoms including worsening pain, no improvement within 48 hours on antibiotics, or any other/worsening symptoms       Eustaquio Maize, PA-C 02/16/21 1956    Lucrezia Starch, MD 02/17/21 [phone]

## 2021-02-16 NOTE — ED Provider Notes (Signed)
[facility]    CSN: [id number] Arrival date & time: 02/16/21  7741      History   Chief Complaint Chief Complaint  Patient presents with   Abdominal Pain   Weakness   Chills   Fatigue    HPI Nicholas West. is a 65 y.o. male with a history of diverticulitis status post sigmoid colectomy comes to urgent care with right lower quadrant abdominal pain which started yesterday.  Patient describes the pain as throbbing and somewhat sharp.  No known relieving factors.  Pain is aggravated by laying on the right side.  No fall or trauma.  Patient denies any chills but was febrile with a temperature of 102.9 Fahrenheit in the urgent care.  He denies any nausea or vomiting. HPI  Past Medical History:  Diagnosis Date   Abdominal pain    Atrial fibrillation ([place])    BCC (basal cell carcinoma) 09/14/2010   ulcerated, top right scalp, Treatment Zyclara   BCC (basal cell carcinoma) 02/05/2013   Recurrent, top right scalp, MOHs   BCC (basal cell carcinoma) 09/05/2017   Mico nod, left forehead, MOHs (Dr Link Snuffer)   Chest pain    negative stress echo in September 2013   Diverticular disease    Diverticulitis    Dysplastic nevus    moderate, Left back no treatment   GERD (gastroesophageal reflux disease)     Patient Active Problem List   Diagnosis Date Noted   Hiatal hernia 08/02/2020   Laceration of left hand without foreign body 10/11/2016   Contusion of right elbow 10/11/2016   Paroxysmal atrial fibrillation ([place]) 10/24/2012   Hypercholesterolemia 04/03/2012   ED (erectile dysfunction) 03/05/2012   Hypogonadism male 03/05/2012    Past Surgical History:  Procedure Laterality Date   COLON SURGERY     PARTIAL COLECTOMY  11/11       Home Medications    Prior to Admission medications   Medication Sig Start Date End Date Taking? Authorizing Provider  aspirin 81 MG tablet Take 81 mg by mouth daily.    [provider]  B-D 3CC LUER-LOK SYR 23GX1" 23G X  1" 3 ML MISC SMARTSIG:1 Injection Once a Week 02/06/20   [provider]  B-D HYPODERMIC NEEDLE 18GX1.5" 18G X 1-1/2" MISC USE TO DRAW UP MEDICATION 04/01/20   [provider]  calcium carbonate (OS-CAL - DOSED IN MG OF ELEMENTAL CALCIUM) 1250 MG tablet Take 2 tablets by mouth 2 (two) times daily with a meal.     [provider]  cholecalciferol (VITAMIN D) 1000 UNITS tablet Take 1,000 Units by mouth 2 (two) times daily.    [provider]  COVID-19 mRNA Vac-TriS, Pfizer, (PFIZER-BIONT COVID-19 VAC-TRIS) SUSP injection Inject into the muscle. 12/03/20   Carlyle Basques, MD  diltiazem [facility] CD) 240 MG 24 hr capsule TAKE 1 CAPSULE(240 MG) BY MOUTH DAILY 05/13/20   Martinique, Peter M, MD  Docusate Sodium (COLACE PO) Take 1 tablet by mouth as directed.    [provider]  Multiple Vitamin (MULTIVITAMIN) tablet Take 1 tablet by mouth daily.    [provider]  Omega-3 Fatty Acids (OMEGA 3 PO) Take 2 capsules by mouth daily.     [provider]  omeprazole (PRILOSEC) 20 MG capsule Take 1 capsule (20 mg total) by mouth daily. 03/05/12   [name], MD  Probiotic Product (PROBIOTIC-10 PO) Take 1 tablet by mouth daily.    [provider]  sildenafil (REVATIO) 20  MG tablet Take by mouth. Take 1 to 5 tabs by mouth as needed for erectile dysfunction    [provider]  tacrolimus (PROTOPIC) 0.1 % ointment Apply 1 application topically daily. 06/09/20   Lavonna Monarch, MD  testosterone cypionate (DEPOTESTOSTERONE CYPIONATE) 200 MG/ML injection Inject into the muscle once a week.     [provider]    Family History Family History  Problem Relation Age of Onset   Cancer Mother    Alzheimer's disease Father    Colon polyps Father    GER disease Father    Heart disease Paternal Grandmother    Cancer Paternal Grandfather     Social History Social History   Tobacco Use   Smoking status: Never   Smokeless tobacco:  Never  Substance Use Topics   Alcohol use: No   Drug use: No     Allergies   Penicillins   Review of Systems Review of Systems  Gastrointestinal:  Positive for abdominal pain and constipation. Negative for abdominal distention, diarrhea, nausea and vomiting.  Musculoskeletal: Negative.   Neurological: Negative.     Physical Exam Triage Vital Signs ED Triage Vitals  Enc Vitals Group     BP 02/16/21 0915 121/80     Pulse Rate 02/16/21 0915 89     Resp 02/16/21 0915 17     Temp 02/16/21 0915 (!) 102.9 F (39.4 C)     Temp Source 02/16/21 0915 Oral     SpO2 02/16/21 0915 93 %     Weight --      Height --      Head Circumference --      Peak Flow --      Pain Score 02/16/21 0913 6     Pain Loc --      Pain Edu? --      Excl. in [place]? --    No data found.  Updated Vital Signs BP 121/80 (BP Location: Right Arm)   Pulse 89   Temp (!) 102.9 F (39.4 C) (Oral)   Resp 17   SpO2 93%   Visual Acuity Right Eye Distance:   Left Eye Distance:   Bilateral Distance:    Right Eye Near:   Left Eye Near:    Bilateral Near:     Physical Exam Vitals and nursing note reviewed.  Constitutional:      General: He is not in acute distress.    Appearance: He is ill-appearing.  Abdominal:     General: Bowel sounds are normal.     Palpations: Abdomen is soft. There is no shifting dullness, hepatomegaly or splenomegaly.     Tenderness: There is abdominal tenderness in the right lower quadrant. There is no guarding or rebound.  Neurological:     Mental Status: He is alert.     UC Treatments / Results  Labs (all labs ordered are listed, but only abnormal results are displayed) Labs Reviewed - No data to display  EKG   Radiology No results found.  Procedures Procedures (including critical care time)  Medications Ordered in UC Medications - No data to display  Initial Impression / Assessment and Plan / UC Course  I have reviewed the triage vital signs and the  nursing notes.  Pertinent labs & imaging results that were available during my care of the patient were reviewed by me and considered in my medical decision making (see chart for details).     1.  Right sided abdominal pain with febrile episode:  Patient abdominal exam at this time is benign Given the history of diverticulitis with sigmoid colectomy in the setting of the current symptoms, I advised patient to go to the emergency department for imaging and further management.  Patient agreed to the ED visit.  This was shared decision making between myself and the patient. Final Clinical Impressions(s) / UC Diagnoses   Final diagnoses:  Diverticulitis of large intestine, unspecified bleeding status, unspecified complication status     Discharge Instructions      Patient is advised to go to the emergency department giving his symptoms.  He will benefit from a CT scan of the abdomen, fluids and antibiotics.   ED Prescriptions   None    PDMP not reviewed this encounter.   Chase Picket, MD 02/16/21 [phone]

## 2021-02-16 NOTE — ED Provider Notes (Signed)
Emergency Medicine Provider Triage Evaluation Note  Nicholas West. , a 64 y.o. male  was evaluated in triage.  Pt complains of abdominal pain beginning yesterday morning. Sharp, right sided. Constipation.  Review of Systems  Positive: Abdominal pain, fever, constipation Negative: Vomiting, urinary sx, diarrhea  Physical Exam  BP 127/85   Pulse 89   Temp (!) 101.2 F (38.4 C)   Resp 18   SpO2 96%  Gen:   Awake, no distress   Resp:  Normal effort  MSK:   Moves extremities without difficulty  Other:  Right lower quadrant abdominal tenderness  Medical Decision Making  Medically screening exam initiated at 2:48 PM.  Appropriate orders placed.  Nicholas West. was informed that the remainder of the evaluation will be completed by another provider, this initial triage assessment does not replace that evaluation, and the importance of remaining in the ED until their evaluation is complete.     Lorayne Bender, PA-C 02/16/21 1517    Lajean Saver, MD 02/21/21 [phone]

## 2021-05-06 NOTE — Progress Notes (Signed)
[place] Date of Birth: 1955-11-11 Medical Record #[id number]  History of Present Illness: Nicholas West is seen for follow up of atrial fibrillation.   He has a history of paroxysmal atrial fibrillation. He has been managed with rate control with diltiazem. He has a  Mali vascular score of 0. He is taking a baby aspirin daily.   On his prior visit he complained of nausea and tingling in his arms with exercise. We arranged a POET. On arrival he was in AFib with rate 98. He exercised for 9 minutes on the Bruce protocol without symptoms or ST changes. He converted to NSR then went back into atrial flutter with RVR. His Cardizem was increased to 240 mg daily.    On follow today he reports he is doing very well.  He is walking on treadmill occasionally at home. No chest pain, dizziness, chest pain. Notes rare arrhythmia usually in the early morning but it doesn't last long or cause any limitation.   Current Outpatient Medications on File Prior to Visit  Medication Sig Dispense Refill   aspirin 81 MG tablet Take 81 mg by mouth daily.     B-D 3CC LUER-LOK SYR 23GX1" 23G X 1" 3 ML MISC SMARTSIG:1 Injection Once a Week     B-D HYPODERMIC NEEDLE 18GX1.5" 18G X 1-1/2" MISC USE TO DRAW UP MEDICATION     calcium carbonate (OS-CAL - DOSED IN MG OF ELEMENTAL CALCIUM) 1250 MG tablet Take 2 tablets by mouth 2 (two) times daily with a meal.      cholecalciferol (VITAMIN D) 1000 UNITS tablet Take 1,000 Units by mouth 2 (two) times daily.     ciprofloxacin (CIPRO) 500 MG tablet Take 1 tablet (500 mg total) by mouth 2 (two) times daily. 14 tablet 0   COVID-19 mRNA Vac-TriS, Pfizer, (PFIZER-BIONT COVID-19 VAC-TRIS) SUSP injection Inject into the muscle. 0.3 mL 0   Docusate Sodium (COLACE PO) Take 1 tablet by mouth as directed.     Multiple Vitamin (MULTIVITAMIN) tablet Take 1 tablet by mouth daily.     Omega-3 Fatty Acids (OMEGA 3 PO) Take 2 capsules by mouth daily.      omeprazole (PRILOSEC) 20 MG capsule  Take 1 capsule (20 mg total) by mouth daily. 30 capsule 11   Probiotic Product (PROBIOTIC-10 PO) Take 1 tablet by mouth daily.     sildenafil (REVATIO) 20 MG tablet Take by mouth. Take 1 to 5 tabs by mouth as needed for erectile dysfunction     tacrolimus (PROTOPIC) 0.1 % ointment Apply 1 application topically daily. 100 g 2   testosterone cypionate (DEPOTESTOSTERONE CYPIONATE) 200 MG/ML injection Inject into the muscle once a week.      No current facility-administered medications on file prior to visit.    Allergies  Allergen Reactions   Penicillins     rash    Past Medical History:  Diagnosis Date   Abdominal pain    Atrial fibrillation (HCC)    BCC (basal cell carcinoma) 09/14/2010   ulcerated, top right scalp, Treatment Zyclara   BCC (basal cell carcinoma) 02/05/2013   Recurrent, top right scalp, MOHs   BCC (basal cell carcinoma) 09/05/2017   Mico nod, left forehead, MOHs (Dr Link Snuffer)   Chest pain    negative stress echo in September 2013   Diverticular disease    Diverticulitis    Dysplastic nevus    moderate, Left back no treatment   GERD (gastroesophageal reflux disease)     Past Surgical  History:  Procedure Laterality Date   COLON SURGERY     PARTIAL COLECTOMY  11/11    Social History   Tobacco Use  Smoking Status Never  Smokeless Tobacco Never    Social History   Substance and Sexual Activity  Alcohol Use No    Family History  Problem Relation Age of Onset   Cancer Mother    Alzheimer's disease Father    Colon polyps Father    GER disease Father    Heart disease Paternal Grandmother    Cancer Paternal Grandfather     Review of Systems: The review of systems is per the HPI.  All other systems were reviewed and are negative.  Physical Exam: BP 134/80   Pulse 68   Ht 6\' 1"  (1.854 m)   Wt 182 lb 9.6 oz (82.8 kg)   SpO2 98%   BMI 24.09 kg/m  GENERAL:  Well appearing WM in NAD HEENT:  PERRL, EOMI, sclera are clear. Oropharynx is  clear. NECK:  No jugular venous distention, carotid upstroke brisk and symmetric, no bruits, no thyromegaly or adenopathy LUNGS:  Clear to auscultation bilaterally CHEST:  Unremarkable HEART:  RRR,  PMI not displaced or sustained,S1 and S2 within normal limits, no S3, no S4: no clicks, no rubs, no murmurs ABD:  Soft, nontender. BS +, no masses or bruits. No hepatomegaly, no splenomegaly EXT:  2 + pulses throughout, no edema, no cyanosis no clubbing SKIN:  Warm and dry.  No rashes NEURO:  Alert and oriented x 3. Cranial nerves II through XII intact. PSYCH:  Cognitively intact  LABORATORY DATA:   Lab Results  Component Value Date   WBC 14.4 (H) 02/16/2021   HGB 16.7 02/16/2021   HCT 48.4 02/16/2021   PLT 163 02/16/2021   GLUCOSE 96 02/16/2021   CHOL 160 05/22/2019   TRIG 108 05/22/2019   HDL 43 05/22/2019   LDLCALC 97 05/22/2019   ALT 30 02/16/2021   AST 29 02/16/2021   NA 138 02/16/2021   K 3.9 02/16/2021   CL 100 02/16/2021   CREATININE 1.01 02/16/2021   BUN 11 02/16/2021   CO2 27 02/16/2021   TSH 2.330 08/02/2020   PSA 1.38 10/29/2015   INR 1.10 06/04/2013     ETT 08/31/17: Study Highlights   Blood pressure demonstrated a blunted response to exercise. Patient started the test in atrial fibrillation and converted to sinus rhythm and subsequently atrial flutter with rapid ventricular response with exercise. There was no ST segment deviation noted during stress. Negative, adequate stress test.     Assessment / Plan: 1. Paroxysmal atrial fibrillation and flutter. Patient is minimally symptomatic. He has a  Mali vascular score of 0.  Continue  diltiazem daily. On ASA    Follow up in one year

## 2021-05-09 ENCOUNTER — Ambulatory Visit: Payer: Medicare Other | Admitting: Cardiology

## 2021-05-09 ENCOUNTER — Other Ambulatory Visit: Payer: Self-pay

## 2021-05-09 ENCOUNTER — Encounter: Payer: Self-pay | Admitting: Cardiology

## 2021-05-09 VITALS — BP 134/80 | HR 68 | Ht 73.0 in | Wt 182.6 lb

## 2021-05-09 DIAGNOSIS — I480 Paroxysmal atrial fibrillation: Secondary | ICD-10-CM

## 2021-05-09 MED ORDER — DILTIAZEM HCL ER COATED BEADS 240 MG PO CP24: 240.0000 mg | ORAL_CAPSULE | Freq: Every day | ORAL | 3 refills | Status: DC

## 2021-05-09 NOTE — Patient Instructions (Signed)
Medication Instructions:  Continue same medications *If you need a refill on your cardiac medications before your next appointment, please call your pharmacy*   Lab Work: None ordered   Testing/Procedures: None ordered   Follow-Up: At [facility], you and your health needs are our priority.  As part of our continuing mission to provide you with exceptional heart care, we have created designated Provider Care Teams.  These Care Teams include your primary Cardiologist (physician) and Advanced Practice Providers (APPs -  Physician Assistants and Nurse Practitioners) who all work together to provide you with the care you need, when you need it.  We recommend signing up for the patient portal called "MyChart".  Sign up information is provided on this After Visit Summary.  MyChart is used to connect with patients for Virtual Visits (Telemedicine).  Patients are able to view lab/test results, encounter notes, upcoming appointments, etc.  Non-urgent messages can be sent to your provider as well.   To learn more about what you can do with MyChart, go to [email or web address].      Your next appointment:  1 year   Call in July to schedule Oct appointment     The format for your next appointment: Office   Provider:  Dr.Jordan

## 2021-05-18 ENCOUNTER — Ambulatory Visit: Payer: Medicare Other | Attending: Internal Medicine

## 2021-05-18 ENCOUNTER — Other Ambulatory Visit (HOSPITAL_BASED_OUTPATIENT_CLINIC_OR_DEPARTMENT_OTHER): Payer: Self-pay

## 2021-05-18 DIAGNOSIS — Z23 Encounter for immunization: Secondary | ICD-10-CM

## 2021-05-18 MED ORDER — PFIZER COVID-19 VAC BIVALENT 30 MCG/0.3ML IM SUSP
INTRAMUSCULAR | 0 refills | Status: DC
  Filled 2021-05-18: qty 0.3, 1d supply, fill #0

## 2021-05-18 NOTE — Progress Notes (Signed)
   [id number]  Name:  Nicholas West.    MRN: [id number] DOB: 1955-10-12  05/18/2021  Nicholas West was observed post Covid-19 immunization for 15 minutes without incident. He was provided with Vaccine Information Sheet and instruction to access the V-Safe system.   Nicholas West was instructed to call 911 with any severe reactions post vaccine: Difficulty breathing  Swelling of face and throat  A fast heartbeat  A bad rash all over body  Dizziness and weakness   Immunizations Administered     Name Date Dose VIS Date Route   Pfizer Covid-19 Vaccine Bivalent Booster 05/18/2021  2:09 PM 0.3 mL 03/30/2021 Intramuscular   Manufacturer: [employer]   Lot: [id number]   [place]: [phone]

## 2021-06-20 DIAGNOSIS — E291 Testicular hypofunction: Secondary | ICD-10-CM | POA: Diagnosis not present

## 2021-06-27 DIAGNOSIS — E291 Testicular hypofunction: Secondary | ICD-10-CM | POA: Diagnosis not present

## 2021-06-27 DIAGNOSIS — N5201 Erectile dysfunction due to arterial insufficiency: Secondary | ICD-10-CM | POA: Diagnosis not present

## 2021-08-04 ENCOUNTER — Encounter: Payer: Self-pay | Admitting: Family Medicine

## 2021-08-04 ENCOUNTER — Ambulatory Visit (INDEPENDENT_AMBULATORY_CARE_PROVIDER_SITE_OTHER): Payer: Medicare Other | Admitting: Family Medicine

## 2021-08-04 VITALS — BP 126/74 | HR 74 | Temp 98.0°F | Resp 15 | Ht 73.0 in | Wt 178.0 lb

## 2021-08-04 DIAGNOSIS — Z130 Encounter for screening for diseases of the blood and blood-forming organs and certain disorders involving the immune mechanism: Secondary | ICD-10-CM | POA: Diagnosis not present

## 2021-08-04 DIAGNOSIS — Z131 Encounter for screening for diabetes mellitus: Secondary | ICD-10-CM | POA: Diagnosis not present

## 2021-08-04 DIAGNOSIS — I480 Paroxysmal atrial fibrillation: Secondary | ICD-10-CM

## 2021-08-04 DIAGNOSIS — R2681 Unsteadiness on feet: Secondary | ICD-10-CM | POA: Diagnosis not present

## 2021-08-04 DIAGNOSIS — Z0000 Encounter for general adult medical examination without abnormal findings: Secondary | ICD-10-CM | POA: Diagnosis not present

## 2021-08-04 DIAGNOSIS — Z9181 History of falling: Secondary | ICD-10-CM

## 2021-08-04 DIAGNOSIS — E291 Testicular hypofunction: Secondary | ICD-10-CM | POA: Diagnosis not present

## 2021-08-04 DIAGNOSIS — Z23 Encounter for immunization: Secondary | ICD-10-CM

## 2021-08-04 LAB — CBC WITH DIFFERENTIAL/PLATELET
Basophils Absolute: 0.1 10*3/uL (ref 0.0–0.1)
Basophils Relative: 0.7 % (ref 0.0–3.0)
Eosinophils Absolute: 0.1 10*3/uL (ref 0.0–0.7)
Eosinophils Relative: 0.9 % (ref 0.0–5.0)
HCT: 50.4 % (ref 39.0–52.0)
Hemoglobin: 17.1 g/dL — ABNORMAL HIGH (ref 13.0–17.0)
Lymphocytes Relative: 20.1 % (ref 12.0–46.0)
Lymphs Abs: 1.6 10*3/uL (ref 0.7–4.0)
MCHC: 34 g/dL (ref 30.0–36.0)
MCV: 89.7 fl (ref 78.0–100.0)
Monocytes Absolute: 0.6 10*3/uL (ref 0.1–1.0)
Monocytes Relative: 7 % (ref 3.0–12.0)
Neutro Abs: 5.6 10*3/uL (ref 1.4–7.7)
Neutrophils Relative %: 71.3 % (ref 43.0–77.0)
Platelets: 256 10*3/uL (ref 150.0–400.0)
RBC: 5.62 Mil/uL (ref 4.22–5.81)
RDW: 13.1 % (ref 11.5–15.5)
WBC: 7.9 10*3/uL (ref 4.0–10.5)

## 2021-08-04 LAB — COMPREHENSIVE METABOLIC PANEL
ALT: 40 U/L (ref 0–53)
AST: 34 U/L (ref 0–37)
Albumin: 4.3 g/dL (ref 3.5–5.2)
Alkaline Phosphatase: 61 U/L (ref 39–117)
BUN: 13 mg/dL (ref 6–23)
CO2: 32 mEq/L (ref 19–32)
Calcium: 9.6 mg/dL (ref 8.4–10.5)
Chloride: 101 mEq/L (ref 96–112)
Creatinine, Ser: 1.01 mg/dL (ref 0.40–1.50)
GFR: 77.2 mL/min (ref >60.00)
Glucose, Bld: 78 mg/dL (ref 70–99)
Potassium: 4.6 mEq/L (ref 3.5–5.1)
Sodium: 139 mEq/L (ref 135–145)
Total Bilirubin: 0.8 mg/dL (ref 0.2–1.2)
Total Protein: 7.4 g/dL (ref 6.0–8.3)

## 2021-08-04 LAB — TSH: TSH: 1.78 u[IU]/mL (ref 0.35–5.50)

## 2021-08-04 LAB — HEMOGLOBIN A1C: Hgb A1c MFr Bld: 5.4 % (ref 4.6–6.5)

## 2021-08-04 NOTE — Progress Notes (Signed)
Subjective:  Patient ID: Nicholas Patten., male    DOB: 1956-06-28  Age: 66 y.o. MRN: [id number]  CC:  Chief Complaint  Patient presents with   Annual Exam    Pt wife wanted him to note worsened balance causing a few tumbles with no injury otherwise he is well and no concerns, pt is due for 2nd pneumonia shot, pt is willing to have today     HPI Nicholas Patten. presents for  annual wellness exam, other concern above  Care team: PCP: me Dermatology, Dr. Denna Haggard Urology Dr. Diona Fanti Cardiology, Dr. Martinique, paroxysmal atrial fibrillation. Appt 05/09/21.  Aspirin 81 mg daily, rate control with diltiazem.  Exercise treadmill testing previously showed 9 minutes on Bruce protocol without symptoms or ST changes.  Sinus rhythm and atrial flutter with RVR.  Cardizem increased to 240 mg daily.  Continued on same dose in October.  1 year follow-up.  Balance difficulty: Noticed episodes of decreased balance, sporadic - few times past few months.  Unsteadiness. Initially noted in past year. Few falls without injury. No focal weakness, no headache.  Denies room spinning, legs do not feel weak - just loses balance. More if tired, but no specific time of day.  No skipped meals. Wife on sodium restricted diet - he eats same. Drinking fluids during day 2 bottles water during the day, in addition with meals.  Wears hearing aids - due for repeat assessment.  No home Bp's Some need for siri for directions.   Father with alzheimers, sister with accelerated loss of cognitive abilities - passed last year (52 years older).    Fall screening Fall Risk  08/04/2021 08/02/2020 05/22/2019 09/24/2018 11/26/2017  Falls in the past year? 1 0 0 0 No  Number falls in past yr: 1 - 0 - -  Injury with Fall? 0 - 0 - -  Comment - - - - -  Risk for fall due to : No Fall Risks;History of fall(s) - - - -  Follow up Falls evaluation completed Falls evaluation completed Falls evaluation completed Falls evaluation completed  -   Lighting in home: adequate.  Loose rugs/carpets/pets: none.  Stairs:1 flight with handrail.  Grab bars in bathroom: not yet - recommended.  Timed up and go:9 seconds, normal gait, slight unsteady/waver with first step only otherwise normal.   Depression Screening: Depression screen [facility] 2/9 08/04/2021 08/02/2020 05/22/2019 09/24/2018 11/26/2017  Decreased Interest 0 0 0 0 0  Down, Depressed, Hopeless 0 0 0 0 0  PHQ - 2 Score 0 0 0 0 0    Cancer Screening: Colonoscopy March 2013, plan for repeat 10 years, [facility], Dr. Michail Sermon.  Treated for diverticulitis in July of last year. Has not received call yet.  Followed by urology for history of low testosterone. Dr. Diona Fanti.  On sildenafil for ED based on 2021 visit.  PSA 2.04 in October 2021.  1.40 September 2020, 2.0 06 March 2018.  Did see urology in past month, had prostate check, PSA - told was ok.   Immunization History  Administered Date(s) Administered   Influenza Split 04/30/2012, 05/26/2016   Influenza,inj,Quad PF,6+ Mos 04/22/2013, 06/01/2014, 05/06/2015, 05/25/2017, 05/25/2018, 04/28/2019   Influenza-Unspecified 07/16/2020   PFIZER Comirnaty(Gray Top)Covid-19 Tri-Sucrose Vaccine 12/03/2020   PFIZER(Purple Top)SARS-COV-2 Vaccination 09/06/2019, 10/01/2019, 05/15/2020   Pfizer Covid-19 Vaccine Bivalent Booster 18yrs & up 05/18/2021   Pneumococcal Conjugate-13 08/02/2020   Tdap 04/21/2008   Zoster Recombinat (Shingrix) 06/27/2019   Zoster, Live 07/31/2014, 03/20/2015  He will check with pharmacy to see if 2nd shingrix.   Functional Status Survey: Is the patient deaf or have difficulty hearing?: Yes Does the patient have difficulty seeing, even when wearing glasses/contacts?: No Does the patient have difficulty concentrating, remembering, or making decisions?: Yes Does the patient have difficulty walking or climbing stairs?: Yes (recent balance issue) Does the patient have difficulty dressing or bathing?: No Does the patient  have difficulty doing errands alone such as visiting a doctor's office or shopping?: No  Memory Screen: 6CIT Screen 08/04/2021 08/02/2020 11/26/2017  What Year? 0 points 0 points 0 points  What month? 0 points 0 points 0 points  What time? 3 points 3 points 0 points  Count back from 20 0 points 0 points 0 points  Months in reverse 0 points 0 points 0 points  Repeat phrase 0 points 0 points 0 points  Total Score 3 3 0    Alcohol Screening: [place] Office Visit from 08/04/2021 in [facility]  AUDIT-C Score [place] Office Visit from 08/04/2021 in [facility] [place]  AUDIT-C Score 0      No alcohol.   Tobacco: None.   Vision Screening   Right eye Left eye Both eyes  Without correction     With correction 20/40 20/30 20/20   Optho/optometry: viist last December - due for annual exam  - contact lenses. Vision doing well. No major changes.   Dental: Every 3 months - Dr. Toy Cookey  Exercise: Retired. 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 Walking on treadmill once per week.   Advanced Directives:  Has HCPOA, living will. No changes.     History Patient Active Problem List   Diagnosis Date Noted   Hiatal hernia 08/02/2020   Laceration of left hand without foreign body 10/11/2016   Contusion of right elbow 10/11/2016   Paroxysmal atrial fibrillation ([address]) 10/24/2012   Hypercholesterolemia 04/03/2012   ED (erectile dysfunction) 03/05/2012   Hypogonadism male 03/05/2012   Past Medical History:  Diagnosis Date   Abdominal pain    Atrial fibrillation ([place])    BCC (basal cell carcinoma) 09/14/2010   ulcerated, top right scalp, Treatment Zyclara   BCC (basal cell carcinoma) 02/05/2013   Recurrent, top right scalp, MOHs   BCC (basal cell carcinoma) 09/05/2017   [place] nod, left forehead, MOHs (Dr Link Snuffer)   Chest pain    negative stress echo in September  2013   Diverticular disease    Diverticulitis    Dysplastic nevus    moderate, Left back no treatment   GERD (gastroesophageal reflux disease)    Past Surgical History:  Procedure Laterality Date   COLON SURGERY     PARTIAL COLECTOMY  11/11   Allergies  Allergen Reactions   Penicillins     rash   Prior to Admission medications   Medication Sig Start Date End Date Taking? Authorizing Provider  aspirin 81 MG tablet Take 81 mg by mouth daily.    [provider]  B-D 3CC LUER-LOK SYR 23GX1" 23G X 1" 3 ML MISC SMARTSIG:1 Injection Once a Week 02/06/20   [provider]  B-D HYPODERMIC NEEDLE 18GX1.5" 18G X 1-1/2" MISC USE TO DRAW UP MEDICATION 04/01/20   [provider]  calcium carbonate (OS-CAL - DOSED IN MG OF ELEMENTAL CALCIUM) 1250 MG tablet Take 2 tablets by mouth 2 (two) times daily with a meal.  [provider]  cholecalciferol (VITAMIN D) 1000 UNITS tablet Take 1,000 Units by mouth 2 (two) times daily.    [provider]  ciprofloxacin (CIPRO) 500 MG tablet Take 1 tablet (500 mg total) by mouth 2 (two) times daily. 02/16/21   Alroy Bailiff, Margaux, PA-C  diltiazem (CARDIZEM CD) 240 MG 24 hr capsule Take 1 capsule (240 mg total) by mouth daily. 05/09/21   Martinique, Peter M, MD  Docusate Sodium (COLACE PO) Take 1 tablet by mouth as directed.    [provider]  Multiple Vitamin (MULTIVITAMIN) tablet Take 1 tablet by mouth daily.    [provider]  Omega-3 Fatty Acids (OMEGA 3 PO) Take 2 capsules by mouth daily.     [provider]  omeprazole (PRILOSEC) 20 MG capsule Take 1 capsule (20 mg total) by mouth daily. 03/05/12   [name], MD  Probiotic Product (PROBIOTIC-10 PO) Take 1 tablet by mouth daily.    [provider]  sildenafil (REVATIO) 20 MG tablet Take by mouth. Take 1 to 5 tabs by mouth as needed for erectile dysfunction    [provider]  tacrolimus (PROTOPIC) 0.1 % ointment Apply 1  application topically daily. 06/09/20   Lavonna Monarch, MD  testosterone cypionate (DEPOTESTOSTERONE CYPIONATE) 200 MG/ML injection Inject into the muscle once a week.     [provider]   Social History   Socioeconomic History   Marital status: Married    Spouse name: Not on file   Number of children: 0   Years of education: Not on file   Highest education level: Not on file  Occupational History   Occupation: attorney    Employer: [employer]  Tobacco Use   Smoking status: Never   Smokeless tobacco: Never  Substance and Sexual Activity   Alcohol use: No   Drug use: No   Sexual activity: Yes  Other Topics Concern   Not on file  Social History Narrative   Married   Exercise: Yes   Education: Secretary/administrator   Social Determinants of Health   Financial Resource Strain: Not on file  Food Insecurity: Not on file  Transportation Needs: Not on file  Physical Activity: Not on file  Stress: Not on file  Social Connections: Not on file  Intimate Partner Violence: Not on file    Review of Systems 13 point review of systems per patient health survey noted.  Negative other than as indicated above or in HPI.    Objective:   Vitals:   08/04/21 0804  BP: 126/74  Pulse: 74  Resp: 15  Temp: 98 F (36.7 C)  TempSrc: Temporal  SpO2: 98%  Weight: 178 lb (80.7 kg)  Height: 6\' 1"  (1.854 m)     Physical Exam Vitals reviewed.  Constitutional:      Appearance: He is well-developed.  HENT:     Head: Normocephalic and atraumatic.     Right Ear: External ear normal.     Left Ear: External ear normal.  Eyes:     Conjunctiva/sclera: Conjunctivae normal.     Pupils: Pupils are equal, round, and reactive to light.  Neck:     Thyroid: No thyromegaly.  Cardiovascular:     Rate and Rhythm: Normal rate. Rhythm irregular.     Heart sounds: Normal heart sounds.     Comments: Irregular, irregular.  Pulmonary:     Effort: Pulmonary effort is normal. No respiratory  distress.     Breath sounds: Normal breath sounds.  No wheezing.  Abdominal:     General: There is no distension.     Palpations: Abdomen is soft.     Tenderness: There is no abdominal tenderness.  Musculoskeletal:        General: No tenderness. Normal range of motion.     Cervical back: Normal range of motion and neck supple.  Lymphadenopathy:     Cervical: No cervical adenopathy.  Skin:    General: Skin is warm and dry.  Neurological:     General: No focal deficit present.     Mental Status: He is alert and oriented to person, place, and time.     GCS: GCS eye subscore is 4. GCS verbal subscore is 5. GCS motor subscore is 6.     Cranial Nerves: No dysarthria or facial asymmetry.     Motor: Motor function is intact. No weakness, abnormal muscle tone or pronator drift.     Coordination: Coordination is intact. Romberg sign negative. Coordination normal. Finger-Nose-Finger Test and Heel to [facility] Test normal. Rapid alternating movements normal.     Gait: Gait is intact.     Deep Tendon Reflexes: Reflexes are normal and symmetric.  Psychiatric:        Behavior: Behavior normal.       Assessment & Plan:  Schon Zeiders. is a 66 y.o. male . Encounter for Medicare annual wellness exam - Plan: Comprehensive metabolic panel, CBC with Differential/Platelet, Hemoglobin A1c  - - anticipatory guidance as below in AVS, screening labs if needed. Health maintenance items as above in HPI discussed/recommended as applicable.  - no concerning responses on depression, fall, or functional status screening. Any positive responses noted as above. Advanced directives discussed as in CHL.   Hypogonadism male - Plan: Comprehensive metabolic panel  -Followed by urology, check labs with history of fall, unsteadiness.  Recent visit with urology and reported normal readings.  Screening for deficiency anemia - Plan: CBC with Differential/Platelet  Screening for diabetes mellitus - Plan: Hemoglobin  A1c  History of fall - Plan: Comprehensive metabolic panel, CBC with Differential/Platelet, TSH Unsteadiness - Plan: Comprehensive metabolic panel, CBC with Differential/Platelet, TSH  -No focal deficits on exam.  Initially recommend increase fluids, check electrolytes, check blood pressure intermittently as he is on calcium channel blocker.  Especially recommended checking blood pressure if he does have an episode of unsteadiness.  ER precautions if any focal weakness or acute changes.  Recheck 1 month.  Fall precautions discussed, home safety discussed.  Additionally he did have concerns regarding memory as above, but 6 CIT reassuring.  Consider [place]/MMSE at next visit  Paroxysmal atrial fibrillation ([place]) - Plan: TSH Managed by cardiology, rate controlled with calcium channel blocker, on aspirin without new bleeding.  No orders of the defined types were placed in this encounter.  Patient Instructions  Make sure to drink plenty of water during the day. Keep a record of your blood pressures outside of the office and bring them to the next office visit. If you do feel unsteady, check your blood pressure at that time. I will check labs, but follow up in next month to discuss balance further. Return to the clinic or go to the nearest emergency room if any of your symptoms worsen or new symptoms occur. We can discuss memory further at that time as well.   You appear to be due for repeat colonoscopy this year. Call Dr. Kathline Magic office if you have not heard form them in next few months.  Walking is great exercise - goal of 150 mins per week.   Preventive Care 25 Years and Older, Male Preventive care refers to lifestyle choices and visits with your health care provider that can promote health and wellness. Preventive care visits are also called wellness exams. What can I expect for my preventive care visit? Counseling During your preventive care visit, your health care provider may ask about  your: Medical history, including: Past medical problems. Family medical history. History of falls. Current health, including: Emotional well-being. Home life and relationship well-being. Sexual activity. Memory and ability to understand (cognition). Lifestyle, including: Alcohol, nicotine or tobacco, and drug use. Access to firearms. Diet, exercise, and sleep habits. Work and work Statistician. Sunscreen use. Safety issues such as seatbelt and bike helmet use. Physical exam Your health care provider will check your: Height and weight. These may be used to calculate your BMI (body mass index). BMI is a measurement that tells if you are at a healthy weight. Waist circumference. This measures the distance around your waistline. This measurement also tells if you are at a healthy weight and may help predict your risk of certain diseases, such as type 2 diabetes and high blood pressure. Heart rate and blood pressure. Body temperature. Skin for abnormal spots. What immunizations do I need? Vaccines are usually given at various ages, according to a schedule. Your health care provider will recommend vaccines for you based on your age, medical history, and lifestyle or other factors, such as travel or where you work. What tests do I need? Screening Your health care provider may recommend screening tests for certain conditions. This may include: Lipid and cholesterol levels. Diabetes screening. This is done by checking your blood sugar (glucose) after you have not eaten for a while (fasting). Hepatitis C test. Hepatitis B test. HIV (human immunodeficiency virus) test. STI (sexually transmitted infection) testing, if you are at risk. Lung cancer screening. Colorectal cancer screening. Prostate cancer screening. Abdominal aortic aneurysm (AAA) screening. You may need this if you are a current or former smoker. Talk with your health care provider about your test results, treatment options,  and if necessary, the need for more tests. Follow these instructions at home: Eating and drinking  Eat a diet that includes fresh fruits and vegetables, whole grains, lean protein, and low-fat dairy products. Limit your intake of foods with high amounts of sugar, saturated fats, and salt. Take vitamin and mineral supplements as recommended by your health care provider. Do not drink alcohol if your health care provider tells you not to drink. If you drink alcohol: Limit how much you have to 0-2 drinks a day. Know how much alcohol is in your drink. In the [place] one drink equals one 12 oz bottle of beer (355 mL), one 5 oz glass of wine (148 mL), or one 1 oz glass of hard liquor (44 mL). Lifestyle Brush your teeth every morning and night with fluoride toothpaste. Floss one time each day. Exercise for at least 30 minutes 5 or more days each week. Do not use any products that contain nicotine or tobacco. These products include cigarettes, chewing tobacco, and vaping devices, such as e-cigarettes. If you need help quitting, ask your health care provider. Do not use drugs. If you are sexually active, practice safe sex. Use a condom or other form of protection to prevent STIs. Take aspirin only as told by your health care provider. Make sure that you understand how much to take and what form to take.  Work with your health care provider to find out whether it is safe and beneficial for you to take aspirin daily. Ask your health care provider if you need to take a cholesterol-lowering medicine (statin). Find healthy ways to manage stress, such as: Meditation, yoga, or listening to music. Journaling. Talking to a trusted person. Spending time with friends and family. Safety Always wear your seat belt while driving or riding in a vehicle. Do not drive: If you have been drinking alcohol. Do not ride with someone who has been drinking. When you are tired or distracted. While texting. If you have been  using any mind-altering substances or drugs. Wear a helmet and other protective equipment during sports activities. If you have firearms in your house, make sure you follow all gun safety procedures. Minimize exposure to UV radiation to reduce your risk of skin cancer. What's next? Visit your health care provider once a year for an annual wellness visit. Ask your health care provider how often you should have your eyes and teeth checked. Stay up to date on all vaccines. This information is not intended to replace advice given to you by your health care provider. Make sure you discuss any questions you have with your health care provider. Document Revised: 01/12/2021 Document Reviewed: 01/12/2021 Elsevier Patient Education  2022 [place],   Merri Ray, MD [place] 08/04/21 8:55 AM

## 2021-08-04 NOTE — Patient Instructions (Addendum)
Make sure to drink plenty of water during the day. Keep a record of your blood pressures outside of the office and bring them to the next office visit. If you do feel unsteady, check your blood pressure at that time. I will check labs, but follow up in next month to discuss balance further. Return to the clinic or go to the nearest emergency room if any of your symptoms worsen or new symptoms occur. We can discuss memory further at that time as well.   You appear to be due for repeat colonoscopy this year. Call Dr. Kathline Magic office if you have not heard form them in next few months.   Walking is great exercise - goal of 150 mins per week.   Preventive Care 19 Years and Older, Male Preventive care refers to lifestyle choices and visits with your health care provider that can promote health and wellness. Preventive care visits are also called wellness exams. What can I expect for my preventive care visit? Counseling During your preventive care visit, your health care provider may ask about your: Medical history, including: Past medical problems. Family medical history. History of falls. Current health, including: Emotional well-being. Home life and relationship well-being. Sexual activity. Memory and ability to understand (cognition). Lifestyle, including: Alcohol, nicotine or tobacco, and drug use. Access to firearms. Diet, exercise, and sleep habits. Work and work Statistician. Sunscreen use. Safety issues such as seatbelt and bike helmet use. Physical exam Your health care provider will check your: Height and weight. These may be used to calculate your BMI (body mass index). BMI is a measurement that tells if you are at a healthy weight. Waist circumference. This measures the distance around your waistline. This measurement also tells if you are at a healthy weight and may help predict your risk of certain diseases, such as type 2 diabetes and high blood pressure. Heart rate and  blood pressure. Body temperature. Skin for abnormal spots. What immunizations do I need? Vaccines are usually given at various ages, according to a schedule. Your health care provider will recommend vaccines for you based on your age, medical history, and lifestyle or other factors, such as travel or where you work. What tests do I need? Screening Your health care provider may recommend screening tests for certain conditions. This may include: Lipid and cholesterol levels. Diabetes screening. This is done by checking your blood sugar (glucose) after you have not eaten for a while (fasting). Hepatitis C test. Hepatitis B test. HIV (human immunodeficiency virus) test. STI (sexually transmitted infection) testing, if you are at risk. Lung cancer screening. Colorectal cancer screening. Prostate cancer screening. Abdominal aortic aneurysm (AAA) screening. You may need this if you are a current or former smoker. Talk with your health care provider about your test results, treatment options, and if necessary, the need for more tests. Follow these instructions at home: Eating and drinking  Eat a diet that includes fresh fruits and vegetables, whole grains, lean protein, and low-fat dairy products. Limit your intake of foods with high amounts of sugar, saturated fats, and salt. Take vitamin and mineral supplements as recommended by your health care provider. Do not drink alcohol if your health care provider tells you not to drink. If you drink alcohol: Limit how much you have to 0-2 drinks a day. Know how much alcohol is in your drink. In the [place] one drink equals one 12 oz bottle of beer (355 mL), one 5 oz glass of wine (148 mL), or one 1  oz glass of hard liquor (44 mL). Lifestyle Brush your teeth every morning and night with fluoride toothpaste. Floss one time each day. Exercise for at least 30 minutes 5 or more days each week. Do not use any products that contain nicotine or tobacco. These  products include cigarettes, chewing tobacco, and vaping devices, such as e-cigarettes. If you need help quitting, ask your health care provider. Do not use drugs. If you are sexually active, practice safe sex. Use a condom or other form of protection to prevent STIs. Take aspirin only as told by your health care provider. Make sure that you understand how much to take and what form to take. Work with your health care provider to find out whether it is safe and beneficial for you to take aspirin daily. Ask your health care provider if you need to take a cholesterol-lowering medicine (statin). Find healthy ways to manage stress, such as: Meditation, yoga, or listening to music. Journaling. Talking to a trusted person. Spending time with friends and family. Safety Always wear your seat belt while driving or riding in a vehicle. Do not drive: If you have been drinking alcohol. Do not ride with someone who has been drinking. When you are tired or distracted. While texting. If you have been using any mind-altering substances or drugs. Wear a helmet and other protective equipment during sports activities. If you have firearms in your house, make sure you follow all gun safety procedures. Minimize exposure to UV radiation to reduce your risk of skin cancer. What's next? Visit your health care provider once a year for an annual wellness visit. Ask your health care provider how often you should have your eyes and teeth checked. Stay up to date on all vaccines. This information is not intended to replace advice given to you by your health care provider. Make sure you discuss any questions you have with your health care provider. Document Revised: 01/12/2021 Document Reviewed: 01/12/2021 Elsevier Patient Education  [place].

## 2021-08-06 ENCOUNTER — Other Ambulatory Visit: Payer: Self-pay | Admitting: Cardiology

## 2021-08-24 DIAGNOSIS — H524 Presbyopia: Secondary | ICD-10-CM | POA: Diagnosis not present

## 2021-09-07 ENCOUNTER — Ambulatory Visit (INDEPENDENT_AMBULATORY_CARE_PROVIDER_SITE_OTHER): Payer: Medicare Other | Admitting: Family Medicine

## 2021-09-07 ENCOUNTER — Encounter: Payer: Self-pay | Admitting: Family Medicine

## 2021-09-07 ENCOUNTER — Encounter: Payer: Self-pay | Admitting: Physician Assistant

## 2021-09-07 VITALS — BP 122/74 | HR 73 | Temp 98.2°F | Resp 17 | Ht 73.0 in | Wt 183.4 lb

## 2021-09-07 DIAGNOSIS — R413 Other amnesia: Secondary | ICD-10-CM | POA: Diagnosis not present

## 2021-09-07 DIAGNOSIS — R2681 Unsteadiness on feet: Secondary | ICD-10-CM | POA: Diagnosis not present

## 2021-09-07 NOTE — Progress Notes (Signed)
Subjective:  Patient ID: Nicholas Patten., male    DOB: 1956-02-18  Age: 66 y.o. MRN: [id number]  CC:  Chief Complaint  Patient presents with   Gait Problem    Pt reports this unsteadiness is not due to dizzy feeling but that his legs are not cooperating and he losses balance, no new falls due to this but has had 2 falls previously   Memory Loss    Pt here to discuss some memory concerns, reports has noticed he has had a hard time remembering how to get places he has been to previously, no other notes from pt on memory     HPI Nicholas West. presents for   Memory change and unsteadiness: Discussed at recent Medicare wellness exam.  No focal deficits at that time.  Initially recommended increasing fluids, electrolytes monitoring and blood pressure.  TSH, CBC were reassuring with borderline elevated hemoglobin.  CMP normal.  History of 2 falls previously, no recent falls.  Reports sensation of his legs not cooperating and losing balance. Had to catch self once since last visit. No pain, just unsteady.  3 meals per day, drinking fluids during day. Fast walk on treadmill for exercise once per week.  No incontinence. Memory changes as above, some difficulty remembering how to get to certain locations - issue if he has not been there recently. Never great with directions. No difficulty with names, ADL, or IADL.  6 CIT screening on January 5 with score of 3. No work issues with memory.  Home blood pressure record brought today.  116-125/62-79. MoCA test today. 27/30.  Difficulty with cube copy and -2 with delayed recall.  Denies depression, anxiety or new stressors.  Father had alzheimers in mid 35's.  Sister with early onset dementia at 82-66, died last year.  Depression screen [facility] 2/9 09/07/2021 08/04/2021 08/02/2020 05/22/2019 09/24/2018  Decreased Interest 0 0 0 0 0  Down, Depressed, Hopeless 0 0 0 0 0  PHQ - 2 Score 0 0 0 0 0  Altered sleeping 0 - - - -  Tired, decreased energy 0 - -  - -  Change in appetite 0 - - - -  Feeling bad or failure about yourself  0 - - - -  Trouble concentrating 0 - - - -  Moving slowly or fidgety/restless 0 - - - -  Suicidal thoughts 0 - - - -  PHQ-9 Score 0 - - - -      History Patient Active Problem List   Diagnosis Date Noted   Hiatal hernia 08/02/2020   Laceration of left hand without foreign body 10/11/2016   Contusion of right elbow 10/11/2016   Paroxysmal atrial fibrillation ([place]) 10/24/2012   Hypercholesterolemia 04/03/2012   ED (erectile dysfunction) 03/05/2012   Hypogonadism male 03/05/2012   Past Medical History:  Diagnosis Date   Abdominal pain    Atrial fibrillation ([place])    BCC (basal cell carcinoma) 09/14/2010   ulcerated, top right scalp, Treatment Zyclara   BCC (basal cell carcinoma) 02/05/2013   Recurrent, top right scalp, MOHs   BCC (basal cell carcinoma) 09/05/2017   [place]ico nod, left forehead, MOHs (Dr Link Snuffer)   Chest pain    negative stress echo in September 2013   Diverticular disease    Diverticulitis    Dysplastic nevus    moderate, Left back no treatment   GERD (gastroesophageal reflux disease)    Past Surgical History:  Procedure Laterality Date   COLON SURGERY  PARTIAL COLECTOMY  11/11   Allergies  Allergen Reactions   Penicillins     rash   Prior to Admission medications   Medication Sig Start Date End Date Taking? Authorizing Provider  aspirin 81 MG tablet Take 81 mg by mouth daily.   Yes [provider]  B-D 3CC LUER-LOK SYR 23GX1" 23G X 1" 3 ML MISC SMARTSIG:1 Injection Once a Week 02/06/20  Yes [provider]  B-D HYPODERMIC NEEDLE 18GX1.5" 18G X 1-1/2" MISC USE TO DRAW UP MEDICATION 04/01/20  Yes [provider]  calcium carbonate (OS-CAL - DOSED IN MG OF ELEMENTAL CALCIUM) 1250 MG tablet Take 2 tablets by mouth 2 (two) times daily with a meal.    Yes [provider]  cholecalciferol (VITAMIN D) 1000 UNITS tablet Take 1,000 Units by mouth 2  (two) times daily.   Yes [provider]  ciprofloxacin (CIPRO) 500 MG tablet Take 1 tablet (500 mg total) by mouth 2 (two) times daily. 02/16/21  Yes Venter, Margaux, PA-C  diltiazem (CARDIZEM CD) 240 MG 24 hr capsule TAKE 1 CAPSULE(240 MG) BY MOUTH DAILY 08/08/21  Yes [place], Peter M, MD  Docusate Sodium (COLACE PO) Take 1 tablet by mouth as directed.   Yes [provider]  Multiple Vitamin (MULTIVITAMIN) tablet Take 1 tablet by mouth daily.   Yes [provider]  Omega-3 Fatty Acids (OMEGA 3 PO) Take 2 capsules by mouth daily.    Yes [provider]  omeprazole (PRILOSEC) 20 MG capsule Take 1 capsule (20 mg total) by mouth daily. 03/05/12  Yes [name], MD  Probiotic Product (PROBIOTIC-10 PO) Take 1 tablet by mouth daily.   Yes [provider]  sildenafil (REVATIO) 20 MG tablet Take by mouth. Take 1 to 5 tabs by mouth as needed for erectile dysfunction   Yes [provider]  tacrolimus (PROTOPIC) 0.1 % ointment Apply 1 application topically daily. 06/09/20  Yes Lavonna Monarch, MD  testosterone cypionate (DEPOTESTOSTERONE CYPIONATE) 200 MG/ML injection Inject into the muscle once a week.    Yes [provider]   Social History   Socioeconomic History   Marital status: Married    Spouse name: Not on file   Number of children: 0   Years of education: Not on file   Highest education level: Not on file  Occupational History   Occupation: attorney    Employer: [employer]  Tobacco Use   Smoking status: Never   Smokeless tobacco: Never  Substance and Sexual Activity   Alcohol use: No   Drug use: No   Sexual activity: Yes  Other Topics Concern   Not on file  Social History Narrative   Married   Exercise: Yes   Education: Secretary/administrator   Social Determinants of Health   Financial Resource Strain: Not on file  Food Insecurity: Not on file  Transportation Needs: Not on file  Physical Activity: Not on file  Stress: Not on  file  Social Connections: Not on file  Intimate Partner Violence: Not on file    Review of Systems  Per HPI.  Objective:   Vitals:   09/07/21 0852  BP: 122/74  Pulse: 73  Resp: 17  Temp: 98.2 F (36.8 C)  TempSrc: Temporal  SpO2: 97%  Weight: 183 lb 6.4 oz (83.2 kg)  Height: 6\' 1"  (1.854 m)     Physical Exam Vitals reviewed.  Constitutional:      Appearance: He is well-developed.  HENT:  Head: Normocephalic and atraumatic.  Neck:     Vascular: No carotid bruit or JVD.  Cardiovascular:     Rate and Rhythm: Normal rate and regular rhythm.     Heart sounds: Normal heart sounds. No murmur heard. Pulmonary:     Effort: Pulmonary effort is normal.     Breath sounds: Normal breath sounds. No rales.  Musculoskeletal:     Right lower leg: No edema.     Left lower leg: No edema.  Skin:    General: Skin is warm and dry.  Neurological:     General: No focal deficit present.     Mental Status: He is alert and oriented to person, place, and time.     Sensory: No sensory deficit.     Motor: No weakness.     Coordination: Coordination normal.     Gait: Gait normal.     Comments: Get up and go test 9 seconds without instability with standing, normal gait, not wide-based.  No antalgia.  Lower extremity exam - no weakness, nontender.   Psychiatric:        Mood and Affect: Mood normal.   31 minutes spent during visit, including chart review, review of in office testing, prior labs, counseling and assimilation of information, exam, discussion of plan, and chart completion.    Assessment & Plan:  Nicholas Walsh. is a 66 y.o. male . Unsteadiness - Plan: Ambulatory referral to Neurology  Memory changes - Plan: Ambulatory referral to Neurology Reassuring MoCA with only a few points off.  Nonfocal neuro exam.  Will refer to neurology to decide on further testing and possible neurocognitive testing.  Continue low intensity exercise, regular meals, hydration.  RTC  precautions  No orders of the defined types were placed in this encounter.  Patient Instructions  I will refer you to neurology.  Testing in the office was overall reassuring.  Continue low intensity exercise like walking most days per week as that can be helpful for memory as well.  Make sure to eat regular meals and drink fluids but that does not seem to be an issue at this time.  Blood pressure looks okay.  Let me know if there are any questions.  Follow-up in 3 months and we can recheck blood counts at that time if needed. Let me know if there are questions sooner.      Signed,   Merri Ray, MD [place] 09/07/21 9:42 AM

## 2021-09-07 NOTE — Patient Instructions (Signed)
I will refer you to neurology.  Testing in the office was overall reassuring.  Continue low intensity exercise like walking most days per week as that can be helpful for memory as well.  Make sure to eat regular meals and drink fluids but that does not seem to be an issue at this time.  Blood pressure looks okay.  Let me know if there are any questions.  Follow-up in 3 months and we can recheck blood counts at that time if needed. Let me know if there are questions sooner.

## 2021-09-12 DIAGNOSIS — H524 Presbyopia: Secondary | ICD-10-CM | POA: Diagnosis not present

## 2021-09-16 ENCOUNTER — Encounter: Payer: Self-pay | Admitting: Physician Assistant

## 2021-09-16 ENCOUNTER — Other Ambulatory Visit (INDEPENDENT_AMBULATORY_CARE_PROVIDER_SITE_OTHER): Payer: Medicare Other

## 2021-09-16 ENCOUNTER — Other Ambulatory Visit: Payer: Self-pay

## 2021-09-16 ENCOUNTER — Ambulatory Visit: Payer: Medicare Other | Admitting: Physician Assistant

## 2021-09-16 DIAGNOSIS — R413 Other amnesia: Secondary | ICD-10-CM

## 2021-09-16 DIAGNOSIS — R253 Fasciculation: Secondary | ICD-10-CM | POA: Diagnosis not present

## 2021-09-16 LAB — COMPREHENSIVE METABOLIC PANEL
ALT: 42 U/L (ref 0–53)
AST: 36 U/L (ref 0–37)
Albumin: 4.6 g/dL (ref 3.5–5.2)
Alkaline Phosphatase: 59 U/L (ref 39–117)
BUN: 12 mg/dL (ref 6–23)
CO2: 34 mEq/L — ABNORMAL HIGH (ref 19–32)
Calcium: 9.6 mg/dL (ref 8.4–10.5)
Chloride: 102 mEq/L (ref 96–112)
Creatinine, Ser: 0.9 mg/dL (ref 0.40–1.50)
GFR: 88.59 mL/min (ref >60.00)
Glucose, Bld: 81 mg/dL (ref 70–99)
Potassium: 4.3 mEq/L (ref 3.5–5.1)
Sodium: 139 mEq/L (ref 135–145)
Total Bilirubin: 0.8 mg/dL (ref 0.2–1.2)
Total Protein: 7.1 g/dL (ref 6.0–8.3)

## 2021-09-16 LAB — CBC
HCT: 47.4 % (ref 39.0–52.0)
Hemoglobin: 16.4 g/dL (ref 13.0–17.0)
MCHC: 34.6 g/dL (ref 30.0–36.0)
MCV: 87.6 fl (ref 78.0–100.0)
Platelets: 169 10*3/uL (ref 150.0–400.0)
RBC: 5.41 Mil/uL (ref 4.22–5.81)
RDW: 13.3 % (ref 11.5–15.5)
WBC: 5.5 10*3/uL (ref 4.0–10.5)

## 2021-09-16 LAB — SEDIMENTATION RATE: Sed Rate: 2 mm/hr (ref 0–20)

## 2021-09-16 LAB — AMMONIA: Ammonia: 33 umol/L (ref 11–35)

## 2021-09-16 LAB — VITAMIN B12: Vitamin B-12: 931 pg/mL — ABNORMAL HIGH (ref 211–911)

## 2021-09-16 LAB — TSH: TSH: 2.05 u[IU]/mL (ref 0.35–5.50)

## 2021-09-16 NOTE — Progress Notes (Addendum)
Assessment/Plan:   Nicholas Patten. is a very pleasant 66 y.o. year old RH male with  a history of hypertension, hyperlipidemia, PAF, today for evaluation of memory difficulties for the last 6 months, balance issues, intermittent double vision. Denies a history of MS.   MoCA today 29/30 with minimal deficiencies. Delayed Recall 5/5 normal orientation.     Recommendations:   Cognitive difficulties of unclear etiology   MRI brain with/without contrast to assess for underlying structural abnormality and assess vascular load  CBC, CMP, TSH, B12, RPR, ammonia, ESR, CRP, ANA, HIV EEG to rule out seizure Neurocognitive testing to evaluate for other causes of memory loss, and clarity of diagnosis Discussed safety both in and out of the home.  Discussed the importance of regular daily schedule with inclusion of crossword puzzles to maintain brain function.  Continue to monitor mood with PCP.  Stay active at least 30 minutes at least 3 times a week.  Naps should be scheduled and should be no longer than 60 minutes and should not occur after 2 PM.  Control cardiovascular risk factors  Mediterranean diet is recommended  Folllow up in 2 months  Control his erythrocytosis with PCP  Subjective:    The patient is seen in neurologic consultation at the request of Wendie Agreste, MD for the evaluation of memory.  The patient is accompanied by his wife  who supplements the history. 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  She states that he cannot remember recent conversations, and within 15 minutes he will ask again.  His short-term memory is worse than the long-term memory.  He denies anyone noticing at work that he has any cognitive difficulties.  She states that he used to be able to drive without difficulties, and now, while driving, he is distracted, and  at times will pass the turn.  He cannot multitask with directions.  Despite having a calendar, first in the morning his wife has to put a note in the mirror in the bathroom otherwise he will forget.  She states that he repeats himself more frequently. He denies being disoriented when walking into a room.  He denies leaving objects in unusual places.  He ambulates without difficulty,  He has some unsteadiness on his feet, especially when taking steps up to the kitchen from the garage.  He has to hold the railing otherwise he would fall.  This has been present for several months, during the falls, he denies any loss of consciousness, "I only hurt to my pride " he says.  In July, during a family vacation, he states that his feet went unsteady, but then again "the sand was too soft ".  He does report for many years leaning to the left.  He denies any prior history of TIA or stroke.  He is unsure if the blood pressure medications may be contributing to the sense dizziness.  He denies any lack of sensations on his legs or arms.  He also has noticed that for several years he had some right greater than left head bobbing, and his wife reports that sometimes he has some left jerky movements of his arm.  He denies any history of seizures or PArkinsonism.  His mood is good, denies depression or irritability.  His wife states that he has significant REM behavior at night,  moving and tossing, possibly acting out.  He does not know if he sleeps well.  He denies remembering any vivid dreams or sleepwalking.  He denies hallucinations or paranoia. History of 2 falls previously, no recent falls.  Reports sensation of his legs not cooperating and losing balance. Had to catch self once since last visit. No pain, just unsteady.  His appetite is good, he eats 3 meals a day, drinking plenty fluids during the day .  He is active, he does occasionally fast to walk during the week.  Denies any hygiene concerns, he is independent of bathing  and dressing.  His wife puts the medications out in a pillbox for him while he is in the shower.  They do the finances together, he reports no issues.  He was concerned recently, because at work, he could not remember the password in his computer, needing to get it out of his own personal computer.  He denies any headaches, he had some episodes of double vision, last one recently went back in to go to the mountains, when he "got hot and tired ".  He denies any blurred vision.  He denies any dizziness, focal numbness or tingling, or anosmia.  No history of seizures.  He denies any tremors.  He denies urine incontinence, retention, constipation or diarrhea.  He denies a history of sleep apnea, alcohol, tobacco. He reports a history of melanocytic nevus s.p large excision of the scal on the left. He also had noticed a recent rash on the forehead. He has an appt with the dermatologist soon. His sister died with the advanced dementia at 16, father with Alzheimer's disease at 61.  Current Outpatient Medications  Medication Instructions   aspirin 81 mg, Daily   B-D 3CC LUER-LOK SYR 23GX1" 23G X 1" 3 ML MISC SMARTSIG:1 Injection Once a Week   B-D HYPODERMIC NEEDLE 18GX1.5" 18G X 1-1/2" MISC USE TO DRAW UP MEDICATION   calcium carbonate (OS-CAL - DOSED IN MG OF ELEMENTAL CALCIUM) 1250 MG tablet 2 tablets, Oral, 2 times daily with meals   cholecalciferol (VITAMIN D) 1,000 Units, 2 times daily   ciprofloxacin (CIPRO) 500 mg, Oral, 2 times daily   diltiazem (CARDIZEM CD) 240 MG 24 hr capsule TAKE 1 CAPSULE(240 MG) BY MOUTH DAILY   Docusate Sodium (COLACE PO) 1 tablet, Oral, As directed   Multiple Vitamin (MULTIVITAMIN) tablet 1 tablet, Daily   Omega-3 Fatty Acids (OMEGA 3 PO) 2 capsules, Oral, Daily   omeprazole (PRILOSEC) 20 mg, Oral, Daily   Probiotic Product (PROBIOTIC-10 PO) 1 tablet, Oral, Daily   sildenafil (REVATIO) 20 MG tablet Oral, Take 1 to 5 tabs by mouth as needed for erectile dysfunction     sildenafil (VIAGRA) 100 mg, Oral, As needed   tacrolimus (PROTOPIC) 0.1 % ointment 1 application, Topical, Daily   testosterone cypionate (DEPOTESTOSTERONE CYPIONATE) 200 MG/ML injection Intramuscular, Weekly     VITALS:  There were no vitals filed for this visit.   PHYSICAL EXAM   HEENT:  Normocephalic, atraumatic. The mucous membranes are moist. The superficial temporal arteries are without ropiness or tenderness. Cardiovascular: Regular rate and rhythm. Lungs: Clear to auscultation bilaterally. Neck: There are no carotid bruits noted bilaterally.  NEUROLOGICAL: Montreal Cognitive Assessment  09/18/2021  Visuospatial/ Executive (0/5) 4  Naming (0/3) 3  Attention: Read list of digits (0/2) 2  Attention: Read list of letters (0/1) 1  Attention: Serial 7 subtraction starting at 100 (0/3) 3  Language: Repeat phrase (0/2) 2  Language :  Fluency (0/1) 1  Abstraction (0/2) 2  Delayed Recall (0/5) 5  Orientation (0/6) 6  Total 29  Adjusted Score (based on education) 29   No flowsheet data found.  No flowsheet data found.   Orientation:  Alert and oriented to person, place and time. No aphasia or dysarthria. Fund of knowledge is appropriate. Recent and remote memory intact.  Attention and concentration are normal.  Able to name objects and repeat phrases. Delayed recall 5/5 Cranial nerves: There is good facial symmetry. Extraocular muscles are intact and visual fields are full to confrontational testing. Speech is fluent and clear. Soft palate rises symmetrically and there is no tongue deviation. Hearing is intact to conversational tone. Tone: Tone is good throughout. Sensation: Sensation is intact to light touch and pinprick throughout. Vibration is intact at the bilateral big toe.There is no extinction with double simultaneous stimulation. There is no sensory dermatomal level identified. Skin with mild rash on the frontal area,  and a suspicious nevus on the L temporoparietall area   Coordination: The patient has no difficulty with RAM's or FNF bilaterally. Normal finger to nose  Motor: Strength is 5/5 in the bilateral upper and lower extremities. There is no pronator drift. There are no fasciculations noted. DTR's: Deep tendon reflexes are 2/4 at the bilateral biceps, triceps, brachioradialis, patella and achilles.  Plantar responses are downgoing bilaterally. Gait and Station: The patient is able to ambulate without difficulty.The patient is able to heel toe walk without any difficulty.The patient is able to ambulate in a tandem fashion. The patient is able to stand in the Romberg position.     Thank you for allowing [place] the opportunity to participate in the care of this nice patient. Please do not hesitate to contact us for any questions or concerns.   Total time spent on today's visit was 60 minutes, including both face-to-face time and nonface-to-face time.  Time included that spent on review of records (prior notes available to me/labs/imaging if pertinent), discussing treatment and goals, answering patient's questions and coordinating care.  Cc:  Wendie Agreste, MD  Sharene Butters 09/18/2021 8:05 PM

## 2021-09-16 NOTE — Patient Instructions (Addendum)
It was a pleasure to see you today at our office.   Recommendations:  Neurocognitive evaluation at our office MRI of the brain, the radiology office will call you to arrange you appointment Check labs today Follow up in 1 month and after neurocognitive testing    RECOMMENDATIONS FOR ALL PATIENTS WITH MEMORY PROBLEMS: 1. Continue to exercise (Recommend 30 minutes of walking everyday, or 3 hours every week) 2. Increase social interactions - continue going to [place] and enjoy social gatherings with friends and family 3. Eat healthy, avoid fried foods and eat more fruits and vegetables 4. Maintain adequate blood pressure, blood sugar, and blood cholesterol level. Reducing the risk of stroke and cardiovascular disease also helps promoting better memory. 5. Avoid stressful situations. Live a simple life and avoid aggravations. Organize your time and prepare for the next day in anticipation. 6. Sleep well, avoid any interruptions of sleep and avoid any distractions in the bedroom that may interfere with adequate sleep quality 7. Avoid sugar, avoid sweets as there is a strong link between excessive sugar intake, diabetes, and cognitive impairment We discussed the Mediterranean diet, which has been shown to help patients reduce the risk of progressive memory disorders and reduces cardiovascular risk. This includes eating fish, eat fruits and green leafy vegetables, nuts like almonds and hazelnuts, walnuts, and also use olive oil. Avoid fast foods and fried foods as much as possible. Avoid sweets and sugar as sugar use has been linked to worsening of memory function.  There is always a concern of gradual progression of memory problems. If this is the case, then we may need to adjust level of care according to patient needs. Support, both to the patient and caregiver, should then be put into place.      You have been referred for a neuropsychological evaluation (i.e., evaluation of memory and thinking  abilities). Please bring someone with you to this appointment if possible, as it is helpful for the doctor to hear from both you and another adult who knows you well. Please bring eyeglasses and hearing aids if you wear them.    The evaluation will take approximately 3 hours and has two parts:   The first part is a clinical interview with the neuropsychologist (Dr. Melvyn Novas or Dr. Nicole Kindred). During the interview, the neuropsychologist will speak with you and the individual you brought to the appointment.    The second part of the evaluation is testing with the doctor's technician Hinton Dyer or Maudie Mercury). During the testing, the technician will ask you to remember different types of material, solve problems, and answer some questionnaires. Your family member will not be present for this portion of the evaluation.   Please note: We must reserve several hours of the neuropsychologist's time and the psychometrician's time for your evaluation appointment. As such, there is a No-Show fee of $100. If you are unable to attend any of your appointments, please contact our office as soon as possible to reschedule.    FALL PRECAUTIONS: Be cautious when walking. Scan the area for obstacles that may increase the risk of trips and falls. When getting up in the mornings, sit up at the edge of the bed for a few minutes before getting out of bed. Consider elevating the bed at the head end to avoid drop of blood pressure when getting up. Walk always in a well-lit room (use night lights in the walls). Avoid area rugs or power cords from appliances in the middle of the walkways. Use a walker  or a cane if necessary and consider physical therapy for balance exercise. Get your eyesight checked regularly.  FINANCIAL OVERSIGHT: Supervision, especially oversight when making financial decisions or transactions is also recommended.  HOME SAFETY: Consider the safety of the kitchen when operating appliances like stoves, microwave oven, and  blender. Consider having supervision and share cooking responsibilities until no longer able to participate in those. Accidents with firearms and other hazards in the house should be identified and addressed as well.   ABILITY TO BE LEFT ALONE: If patient is unable to contact 911 operator, consider using LifeLine, or when the need is there, arrange for someone to stay with patients. Smoking is a fire hazard, consider supervision or cessation. Risk of wandering should be assessed by caregiver and if detected at any point, supervision and safe proof recommendations should be instituted.  MEDICATION SUPERVISION: Inability to self-administer medication needs to be constantly addressed. Implement a mechanism to ensure safe administration of the medications.   DRIVING: Regarding driving, in patients with progressive memory problems, driving will be impaired. We advise to have someone else do the driving if trouble finding directions or if minor accidents are reported. Independent driving assessment is available to determine safety of driving.   If you are interested in the driving assessment, you can contact the following:  [employer] in [place]  [place] [phone] or [phone]    [place] refers to food and lifestyle choices that are based on the traditions of countries located on the [employer]. This way of eating has been shown to help prevent certain conditions and improve outcomes for people who have chronic diseases, like kidney disease and heart disease. What are tips for following this plan? Lifestyle  Cook and eat meals together with your family, when possible. Drink enough fluid to keep your urine clear or pale yellow. Be physically active every day. This includes: Aerobic exercise like running or swimming. Leisure  activities like gardening, walking, or housework. Get 7-8 hours of sleep each night. If recommended by your health care provider, drink red wine in moderation. This means 1 glass a day for nonpregnant women and 2 glasses a day for men. A glass of wine equals 5 oz (150 mL). Reading food labels  Check the serving size of packaged foods. For foods such as rice and pasta, the serving size refers to the amount of cooked product, not dry. Check the total fat in packaged foods. Avoid foods that have saturated fat or trans fats. Check the ingredients list for added sugars, such as corn syrup. Shopping  At the grocery store, buy most of your food from the areas near the walls of the store. This includes: Fresh fruits and vegetables (produce). Grains, beans, nuts, and seeds. Some of these may be available in unpackaged forms or large amounts (in bulk). Fresh seafood. Poultry and eggs. Low-fat dairy products. Buy whole ingredients instead of prepackaged foods. Buy fresh fruits and vegetables in-season from local farmers markets. Buy frozen fruits and vegetables in resealable bags. If you do not have access to quality fresh seafood, buy precooked frozen shrimp or canned fish, such as tuna, salmon, or sardines. Buy small amounts of raw or cooked vegetables, salads, or olives from the deli or salad bar at your store. Stock your pantry so you always have certain foods on hand, such as olive oil, canned tuna, canned tomatoes, rice, pasta, and beans.  Cooking  Cook foods with extra-virgin olive oil instead of using butter or other vegetable oils. Have meat as a side dish, and have vegetables or grains as your main dish. This means having meat in small portions or adding small amounts of meat to foods like pasta or stew. Use beans or vegetables instead of meat in common dishes like chili or lasagna. Experiment with different cooking methods. Try roasting or broiling vegetables instead of steaming or sauteing  them. Add frozen vegetables to soups, stews, pasta, or rice. Add nuts or seeds for added healthy fat at each meal. You can add these to yogurt, salads, or vegetable dishes. Marinate fish or vegetables using olive oil, lemon juice, garlic, and fresh herbs. Meal planning  Plan to eat 1 vegetarian meal one day each week. Try to work up to 2 vegetarian meals, if possible. Eat seafood 2 or more times a week. Have healthy snacks readily available, such as: Vegetable sticks with hummus. Greek yogurt. Fruit and nut trail mix. Eat balanced meals throughout the week. This includes: Fruit: 2-3 servings a day Vegetables: 4-5 servings a day Low-fat dairy: 2 servings a day Fish, poultry, or lean meat: 1 serving a day Beans and legumes: 2 or more servings a week Nuts and seeds: 1-2 servings a day Whole grains: 6-8 servings a day Extra-virgin olive oil: 3-4 servings a day Limit red meat and sweets to only a few servings a month What are my food choices? Mediterranean diet Recommended Grains: Whole-grain pasta. Brown rice. Bulgar wheat. Polenta. Couscous. Whole-wheat bread. [place]. Vegetables: Artichokes. Beets. Broccoli. Cabbage. Carrots. Eggplant. Green beans. Chard. Kale. Spinach. Onions. Leeks. Peas. Squash. Tomatoes. Peppers. Radishes. Fruits: Apples. Apricots. Avocado. Berries. Bananas. Cherries. Dates. Figs. Grapes. Lemons. Melon. Oranges. Peaches. Plums. Pomegranate. Meats and other protein foods: Beans. Almonds. Sunflower seeds. Pine nuts. Peanuts. [place]. Salmon. Scallops. Shrimp. [place]. Tilapia. Clams. Oysters. Eggs. Dairy: Low-fat milk. Cheese. Greek yogurt. Beverages: Water. Red wine. Herbal tea. Fats and oils: Extra virgin olive oil. Avocado oil. Grape seed oil. Sweets and desserts: [place] yogurt with honey. Baked apples. Poached pears. Trail mix. Seasoning and other foods: Basil. Cilantro. Coriander. Cumin. Mint. Parsley. Sage. Rosemary. Tarragon. Garlic. Oregano. Thyme. Pepper.  Balsalmic vinegar. Tahini. Hummus. Tomato sauce. Olives. Mushrooms. Limit these Grains: Prepackaged pasta or rice dishes. Prepackaged cereal with added sugar. Vegetables: Deep fried potatoes (french fries). Fruits: Fruit canned in syrup. Meats and other protein foods: Beef. Pork. Lamb. Poultry with skin. Hot dogs. [place] Salines. Dairy: Ice cream. Sour cream. Whole milk. Beverages: Juice. Sugar-sweetened soft drinks. Beer. Liquor and spirits. Fats and oils: Butter. Canola oil. Vegetable oil. Beef fat (tallow). Lard. Sweets and desserts: Cookies. Cakes. Pies. Candy. Seasoning and other foods: Mayonnaise. Premade sauces and marinades. The items listed may not be a complete list. Talk with your dietitian about what dietary choices are right for you. Summary The Mediterranean diet includes both food and lifestyle choices. Eat a variety of fresh fruits and vegetables, beans, nuts, seeds, and whole grains. Limit the amount of red meat and sweets that you eat. Talk with your health care provider about whether it is safe for you to drink red wine in moderation. This means 1 glass a day for nonpregnant women and 2 glasses a day for men. A glass of wine equals 5 oz (150 mL). This information is not intended to replace advice given to you by your health care provider. Make sure you discuss any questions you have with your health care provider. Document Released:  03/09/2016 Document Revised: 04/11/2016 Document Reviewed: 03/09/2016 Elsevier Interactive Patient Education  2017 [employer].  We have sent a referral to [facility] for your MRI and they will call you directly to schedule your appointment. They are located at [address]. If you need to contact them directly please call [phone].  Your provider has requested that you have labwork completed today. Please go to [facility] ([id number]) on the second floor of this building before leaving the office today. You do not need to  check in. If you are not called within 15 minutes please check with the front desk.

## 2021-09-17 LAB — ANA W/REFLEX: Anti Nuclear Antibody (ANA): NEGATIVE

## 2021-09-19 LAB — HIV-1 RNA QUANT-NO REFLEX-BLD
HIV 1 RNA Quant: NOT DETECTED Copies/mL
HIV-1 RNA Quant, Log: NOT DETECTED Log cps/mL

## 2021-09-19 LAB — RPR: RPR Ser Ql: NONREACTIVE

## 2021-09-19 NOTE — Progress Notes (Signed)
Please inform patient HIV is negative, thanks

## 2021-09-23 ENCOUNTER — Ambulatory Visit: Payer: Medicare Other | Admitting: Neurology

## 2021-09-23 ENCOUNTER — Other Ambulatory Visit: Payer: Self-pay

## 2021-09-23 DIAGNOSIS — R253 Fasciculation: Secondary | ICD-10-CM

## 2021-09-23 DIAGNOSIS — R413 Other amnesia: Secondary | ICD-10-CM

## 2021-09-23 NOTE — Procedures (Signed)
ELECTROENCEPHALOGRAM REPORT  Date of Study: 09/23/2021  Patient's Name: Nicholas West. MRN: [id number] Date of Birth: 07-17-1956  Referring Provider: Sharene Butters, PA-C  Clinical History: This is a 66 year old man with episodes of left arm jerks. EEG for classification.  Medications: aspirin  81 mg, Daily OS-CAL - DOSED IN MG OF ELEMENTAL CALCIUM) 1250 MG tablet VITAMIN D  1,000 Units CIPRO  500 mg CARDIZEM CD 240 MG 24 hr capsule COLACE PO MULTIVITAMIN tablet OMEGA 3 PO PROBIOTIC-10 PO PRILOSEC REVATIO 20 MG tablet VIAGRA DEPOTESTOSTERONE CYPIONATE 200 MG/ML injection  Technical Summary: A multichannel digital EEG recording measured by the international 10-20 system with electrodes applied with paste and impedances below 5000 ohms performed in our laboratory with EKG monitoring in an awake and drowsy patient.  Hyperventilation was not performed. Photic stimulation was performed.  The digital EEG was referentially recorded, reformatted, and digitally filtered in a variety of bipolar and referential montages for optimal display.    Description: The patient is awake and drowsy during the recording.  During maximal wakefulness, there is a symmetric, medium voltage 8-9 Hz posterior dominant rhythm that attenuates with eye opening.  The record is symmetric.  During drowsiness, there is an increase in theta slowing of the background. Sleep was not captured.  Photic stimulation did not elicit any abnormalities.  There were no epileptiform discharges or electrographic seizures seen.    EKG lead showed occasional extrasystolic beats.   Impression: This awake and drowsy EEG is normal.    Clinical Correlation: A normal EEG does not exclude a clinical diagnosis of epilepsy.  If further clinical questions remain, prolonged EEG may be helpful.  Clinical correlation is advised.   Ellouise Newer, M.D.

## 2021-09-28 DIAGNOSIS — I6381 Other cerebral infarction due to occlusion or stenosis of small artery: Secondary | ICD-10-CM | POA: Insufficient documentation

## 2021-10-10 ENCOUNTER — Ambulatory Visit
Admission: RE | Admit: 2021-10-10 | Discharge: 2021-10-10 | Disposition: A | Discharge status: Home / Self Care | Payer: Medicare Other | Source: Ambulatory Visit | Attending: Physician Assistant | Admitting: Physician Assistant

## 2021-10-10 ENCOUNTER — Other Ambulatory Visit: Payer: Self-pay

## 2021-10-10 DIAGNOSIS — R413 Other amnesia: Secondary | ICD-10-CM | POA: Diagnosis not present

## 2021-10-10 DIAGNOSIS — E7800 Pure hypercholesterolemia, unspecified: Secondary | ICD-10-CM | POA: Diagnosis not present

## 2021-10-10 DIAGNOSIS — R4189 Other symptoms and signs involving cognitive functions and awareness: Secondary | ICD-10-CM | POA: Diagnosis not present

## 2021-10-10 DIAGNOSIS — I6381 Other cerebral infarction due to occlusion or stenosis of small artery: Secondary | ICD-10-CM

## 2021-10-10 DIAGNOSIS — R2681 Unsteadiness on feet: Secondary | ICD-10-CM | POA: Diagnosis not present

## 2021-10-10 HISTORY — DX: Other cerebral infarction due to occlusion or stenosis of small artery: I63.81

## 2021-10-10 IMAGING — MR MR HEAD WO/W CM
13 series · 48 of 48 positions shown · IV contrast (multihance)
Comparison: None.

CLINICAL DATA: 65-year-old male with cognitive difficulty. Memory
loss, unsteady gait, confusion. Falls. Hypercholesterolemia.

EXAM:
MRI HEAD WITHOUT AND WITH CONTRAST
TECHNIQUE: Multiplanar, multiecho pulse sequences of the brain and surrounding
structures were obtained without and with intravenous contrast.
CONTRAST:  17mL MULTIHANCE GADOBENATE DIMEGLUMINE 529 MG/ML IV SOLN

[Series 2: T1 · sagittal · 5.0mm · 0.45mm/px · 1 of 23 slices shown]
[im 1/23]
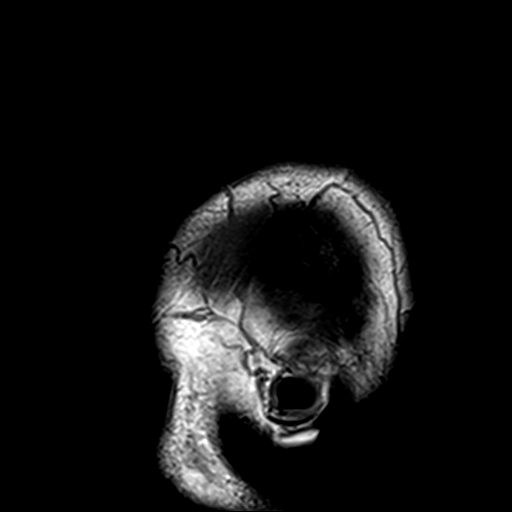

[Series 3: DWI · axial · 3.0mm · 1.80mm/px · z∈[-66,+87]mm · 7 of 104 slices shown (1 of 4)]
[im 1/104]
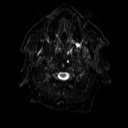
[im 18/104]
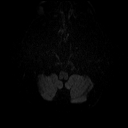
[im 35/104]
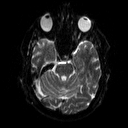
[im 52/104]
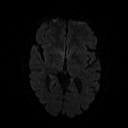
[im 69/104]
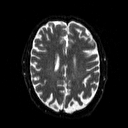
[im 86/104]
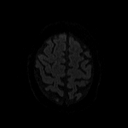
[im 104/104]
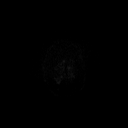

[Series 4: DWI · axial · 3.0mm · 1.80mm/px · z∈[-66,+87]mm · 3 of 49 slices shown (2 of 4)]
[im 1/49]
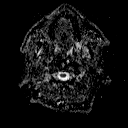
[im 25/49]
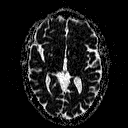
[im 49/49]
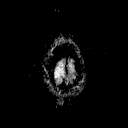

[Series 5: DWI · coronal · 5.0mm · 1.80mm/px · 5 of 77 slices shown (3 of 4)]
[im 1/77]
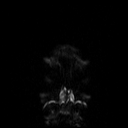
[im 20/77]
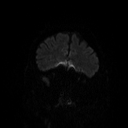
[im 39/77]
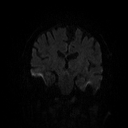
[im 58/77]
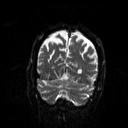
[im 77/77]
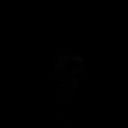

[Series 6: DWI · coronal · 5.0mm · 1.80mm/px · 3 of 39 slices shown (4 of 4)]
[im 1/39]
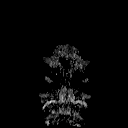
[im 20/39]
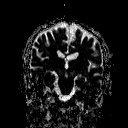
[im 39/39]
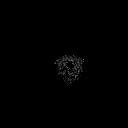

[Series 7: T2 · axial · 5.0mm · 0.60mm/px · z∈[-67,+87]mm · 2 of 24 slices shown (1 of 2)]
[im 1/24]
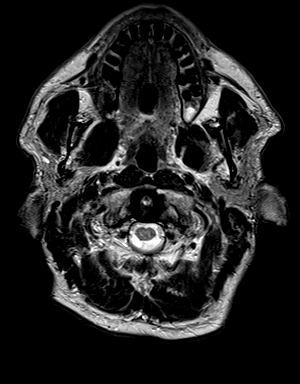
[im 24/24]
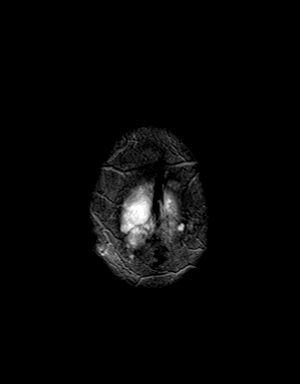

[Series 8: FLAIR · axial · 3.0mm · 0.45mm/px · z∈[-61,+82]mm · 2 of 32 slices shown]
[im 1/32]
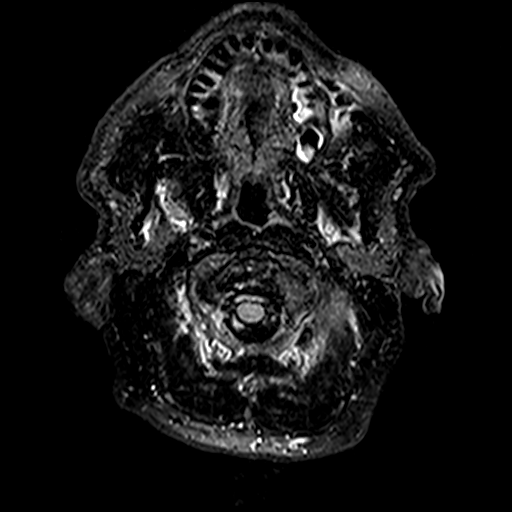
[im 32/32]
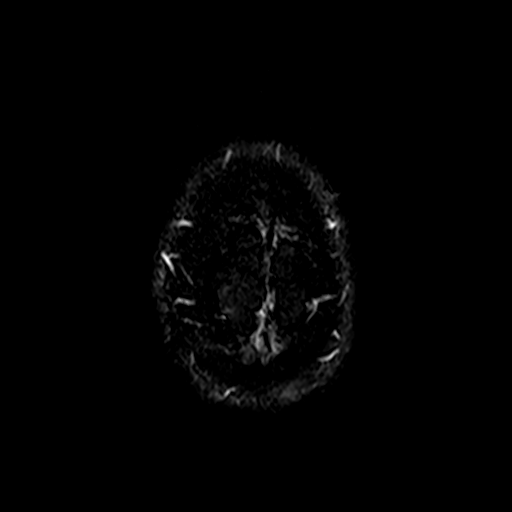

[Series 10: swi_images · axial · 4.0mm · 0.90mm/px · z∈[-59,+80]mm · 2 of 36 slices shown]
[im 1/36]
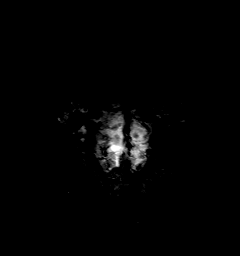
[im 36/36]
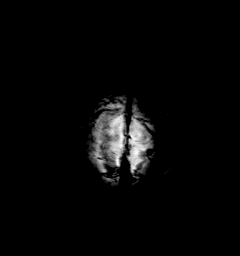

[Series 11: t1_mpr_tra · axial · 1.0mm · 0.75mm/px · z∈[-62,+81]mm · 9 of 141 slices shown]
[im 1/141]
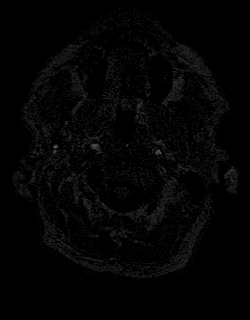
[im 18/141]
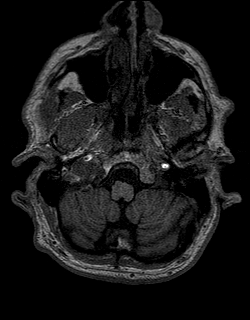
[im 36/141]
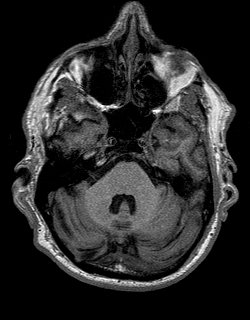
[im 53/141]
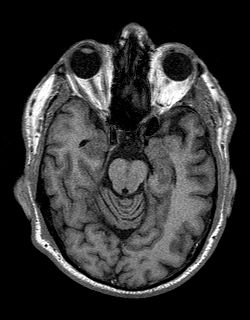
[im 71/141]
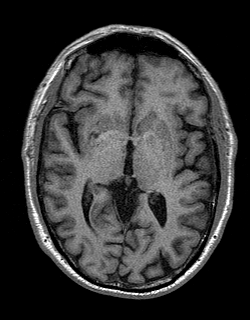
[im 88/141]
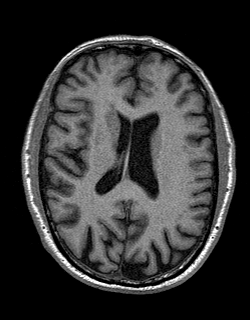
[im 106/141]
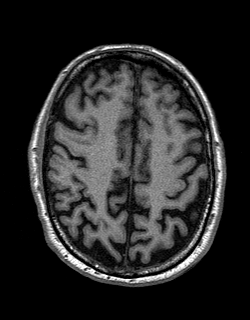
[im 123/141]
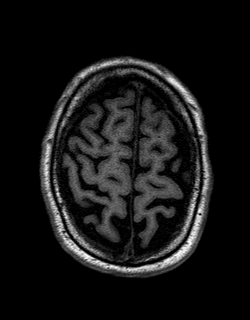
[im 141/141]
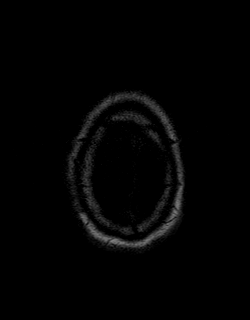

[Series 12: T2 · coronal · 5.0mm · 0.45mm/px · 2 of 30 slices shown (2 of 2)]
[im 1/30]
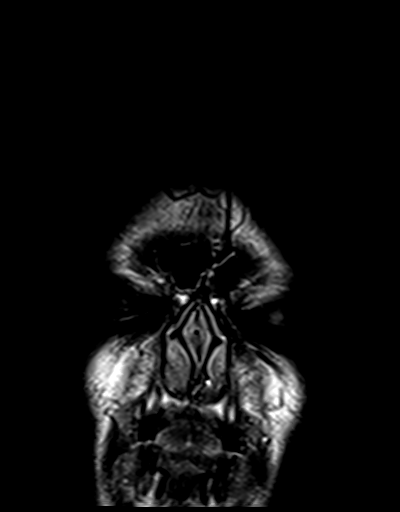
[im 30/30]
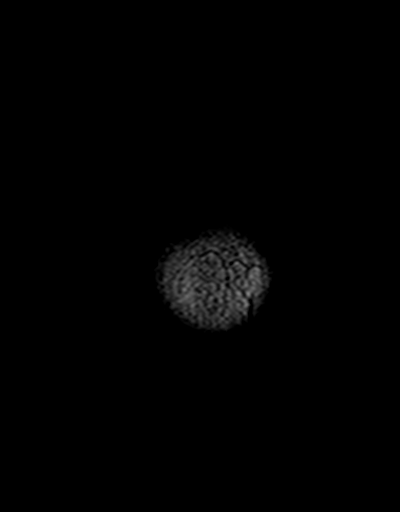

[Series 13: t1_mpr_tra post · axial · 1.0mm · 0.75mm/px · z∈[-62,+81]mm · 9 of 144 slices shown]
[im 1/144]
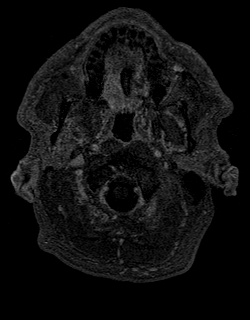
[im 18/144]
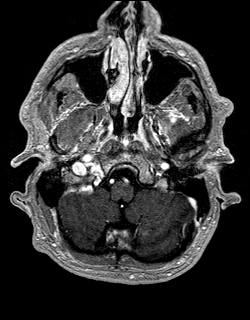
[im 36/144]
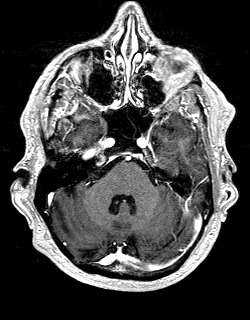
[im 54/144]
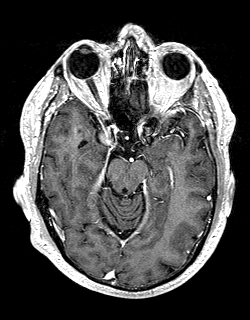
[im 72/144]
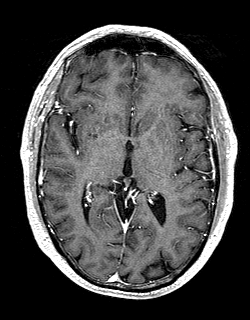
[im 90/144]
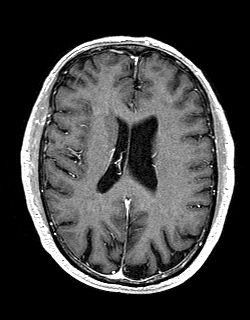
[im 108/144]
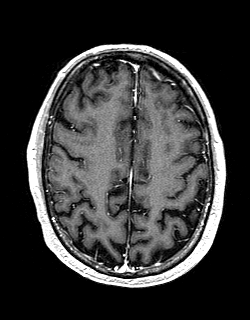
[im 126/144]
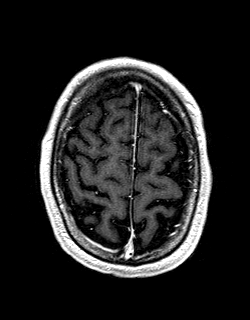
[im 144/144]
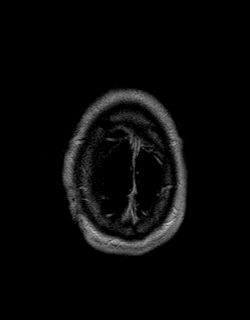

[Series 14: post cor · coronal · 5.0mm · 0.45mm/px · 2 of 30 slices shown]
[im 1/30]
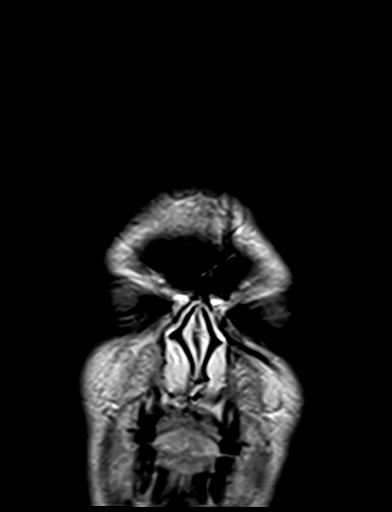
[im 30/30]
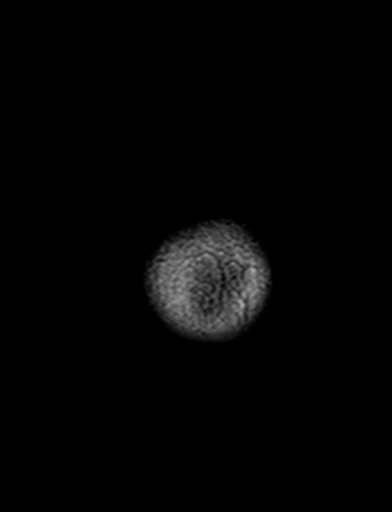

[Series 15: T1 post-contrast · sagittal · 5.0mm · 0.45mm/px · 1 of 16 slices shown]
[im 1/16]
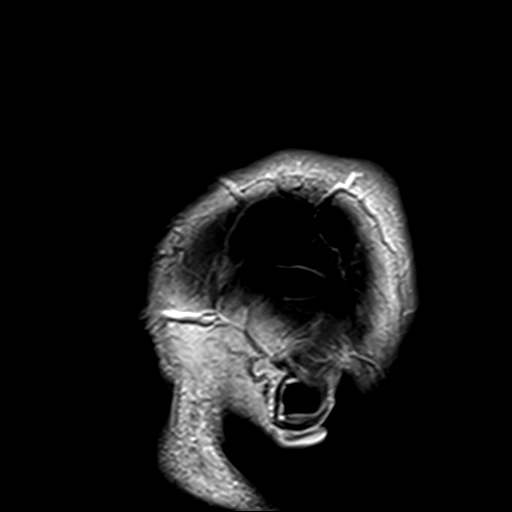

[48 of 48 positions shown; findings below may reference images not displayed]

FINDINGS: Brain: No restricted diffusion to suggest acute infarction. No
midline shift, mass effect, evidence of mass lesion,
ventriculomegaly, extra-axial collection or acute intracranial
hemorrhage. Cervicomedullary junction and pituitary are within
normal limits.

Small but widely scattered mostly subcortical white matter T2 and
FLAIR hyperintensity in both cerebral hemispheres is moderate for
age but in a nonspecific configuration.

No cortical encephalomalacia or chronic cerebral blood products
identified. However, there is a small curvilinear area of gyriform
enhancement at the left occipital pole on series 13, image 50 and
confirmed on coronal postcontrast images. This has a post ischemic
appearance. No regional edema. No other abnormal intracranial
enhancement. No dural thickening.

Mesial temporal lobes, deep gray matter nuclei, brainstem, and
cerebellum are within normal limits for age.

Vascular: Major intracranial vascular flow voids are preserved. The
major dural venous sinuses are enhancing and appear to be patent.

Skull and upper cervical spine: Negative visible cervical spine.
Visualized bone marrow signal is within normal limits. No
destructive osseous lesion identified.

Sinuses/Orbits: Negative orbits. Generally mild paranasal sinus
mucosal thickening., Most pronounced in the left maxillary and
posterior ethmoids

Other: Trace bilateral mastoid air cell fluid. Negative visible
nasopharynx. Other visible internal auditory structures appear
normal. Negative visible scalp and face.
IMPRESSION: 1. Moderate for age cerebral white matter signal changes are
nonspecific but most commonly due to chronic small vessel disease.
And a subtle area of gyriform enhancement in the left occipital pole
is most suggestive of a subacute cortical infarct. No associated
edema or mass effect.

2. No other acute intracranial abnormality.

## 2021-10-10 MED ORDER — GADOBENATE DIMEGLUMINE 529 MG/ML IV SOLN
17.0000 mL | Freq: Once | INTRAVENOUS | Status: AC | PRN
  Administered 2021-10-10: 17 mL via INTRAVENOUS

## 2021-10-14 ENCOUNTER — Ambulatory Visit: Payer: Medicare Other | Admitting: Physician Assistant

## 2021-10-14 ENCOUNTER — Other Ambulatory Visit: Payer: Self-pay

## 2021-10-14 ENCOUNTER — Encounter: Payer: Self-pay | Admitting: Cardiology

## 2021-10-14 ENCOUNTER — Encounter: Payer: Self-pay | Admitting: Physician Assistant

## 2021-10-14 VITALS — BP 140/82 | HR 70 | Ht 73.0 in | Wt 186.4 lb

## 2021-10-14 DIAGNOSIS — I639 Cerebral infarction, unspecified: Secondary | ICD-10-CM

## 2021-10-14 DIAGNOSIS — I480 Paroxysmal atrial fibrillation: Secondary | ICD-10-CM | POA: Diagnosis not present

## 2021-10-14 DIAGNOSIS — R413 Other amnesia: Secondary | ICD-10-CM | POA: Diagnosis not present

## 2021-10-14 MED ORDER — CLOPIDOGREL BISULFATE 75 MG PO TABS: 75.0000 mg | ORAL_TABLET | Freq: Every day | ORAL | 3 refills | Status: DC

## 2021-10-14 NOTE — Patient Instructions (Addendum)
It was a pleasure to see you today at our office.  ? ?Recommendations: ? ?MRA of the head and neck to look at the vessels ?Start Plavix 75 mg daily to protect the vessels  ?Follow  up with Cardiology ?Follow up in 2 month  ? ? ? ?RECOMMENDATIONS FOR ALL PATIENTS WITH MEMORY PROBLEMS: ?1. Continue to exercise (Recommend 30 minutes of walking everyday, or 3 hours every week) ?2. Increase social interactions - continue going to [place] and enjoy social gatherings with friends and family ?3. Eat healthy, avoid fried foods and eat more fruits and vegetables ?4. Maintain adequate blood pressure, blood sugar, and blood cholesterol level. Reducing the risk of stroke and cardiovascular disease also helps promoting better memory. ?5. Avoid stressful situations. Live a simple life and avoid aggravations. Organize your time and prepare for the next day in anticipation. ?6. Sleep well, avoid any interruptions of sleep and avoid any distractions in the bedroom that may interfere with adequate sleep quality ?7. Avoid sugar, avoid sweets as there is a strong link between excessive sugar intake, diabetes, and cognitive impairment ?We discussed the Mediterranean diet, which has been shown to help patients reduce the risk of progressive memory disorders and reduces cardiovascular risk. This includes eating fish, eat fruits and green leafy vegetables, nuts like almonds and hazelnuts, walnuts, and also use olive oil. Avoid fast foods and fried foods as much as possible. Avoid sweets and sugar as sugar use has been linked to worsening of memory function. ? ?There is always a concern of gradual progression of memory problems. If this is the case, then we may need to adjust level of care according to patient needs. Support, both to the patient and caregiver, should then be put into place.  ? ? ?FALL PRECAUTIONS: Be cautious when walking. Scan the area for obstacles that may increase the risk of trips and falls. When getting up in the  mornings, sit up at the edge of the bed for a few minutes before getting out of bed. Consider elevating the bed at the head end to avoid drop of blood pressure when getting up. Walk always in a well-lit room (use night lights in the walls). Avoid area rugs or power cords from appliances in the middle of the walkways. Use a walker or a cane if necessary and consider physical therapy for balance exercise. Get your eyesight checked regularly. ? ?FINANCIAL OVERSIGHT: Supervision, especially oversight when making financial decisions or transactions is also recommended. ? ?HOME SAFETY: Consider the safety of the kitchen when operating appliances like stoves, microwave oven, and blender. Consider having supervision and share cooking responsibilities until no longer able to participate in those. Accidents with firearms and other hazards in the house should be identified and addressed as well. ? ? ?ABILITY TO BE LEFT ALONE: If patient is unable to contact 911 operator, consider using LifeLine, or when the need is there, arrange for someone to stay with patients. Smoking is a fire hazard, consider supervision or cessation. Risk of wandering should be assessed by caregiver and if detected at any point, supervision and safe proof recommendations should be instituted. ? ?MEDICATION SUPERVISION: Inability to self-administer medication needs to be constantly addressed. Implement a mechanism to ensure safe administration of the medications. ? ? ?DRIVING: Regarding driving, in patients with progressive memory problems, driving will be impaired. We advise to have someone else do the driving if trouble finding directions or if minor accidents are reported. Independent driving assessment is available to determine safety  of driving. ? ? ?If you are interested in the driving assessment, you can contact the following: ? ?[facility] [employer] in [place] ? ?[place] [phone] ? ?[facility] [phone] ? ?[facility] [phone] or [phone] ? ? ? ?Mediterranean Diet ?A Mediterranean diet refers to food and lifestyle choices that are based on the traditions of countries located on the [employer]. This way of eating has been shown to help prevent certain conditions and improve outcomes for people who have chronic diseases, like kidney disease and heart disease. ?What are tips for following this plan? ?Lifestyle  ?Cook and eat meals together with your family, when possible. ?Drink enough fluid to keep your urine clear or pale yellow. ?Be physically active every day. This includes: ?Aerobic exercise like running or swimming. ?Leisure activities like gardening, walking, or housework. ?Get 7-8 hours of sleep each night. ?If recommended by your health care provider, drink red wine in moderation. This means 1 glass a day for nonpregnant women and 2 glasses a day for men. A glass of wine equals 5 oz (150 mL). ?Reading food labels  ?Check the serving size of packaged foods. For foods such as rice and pasta, the serving size refers to the amount of cooked product, not dry. ?Check the total fat in packaged foods. Avoid foods that have saturated fat or trans fats. ?Check the ingredients list for added sugars, such as corn syrup. ?Shopping  ?At the grocery store, buy most of your food from the areas near the walls of the store. This includes: ?Fresh fruits and vegetables (produce). ?Grains, beans, nuts, and seeds. Some of these may be available in unpackaged forms or large amounts (in bulk). ?Fresh seafood. ?Poultry and eggs. ?Low-fat dairy products. ?Buy whole ingredients instead of prepackaged foods. ?Buy fresh fruits and vegetables in-season from local farmers markets. ?Buy frozen fruits and vegetables in resealable bags. ?If you do not have access to quality fresh seafood, buy precooked frozen shrimp or canned fish, such as tuna, salmon, or sardines. ?Buy small amounts of raw or cooked  vegetables, salads, or olives from the deli or salad bar at your store. ?Stock your pantry so you always have certain foods on hand, such as olive oil, canned tuna, canned tomatoes, rice, pasta, and beans. ?Cooking  ?Cook foods with extra-virgin olive oil instead of using butter or other vegetable oils. ?Have meat as a side dish, and have vegetables or grains as your main dish. This means having meat in small portions or adding small amounts of meat to foods like pasta or stew. ?Use beans or vegetables instead of meat in common dishes like chili or lasagna. ?Experiment with different cooking methods. Try roasting or broiling vegetables instead of steaming or saut?eing them. ?Add frozen vegetables to soups, stews, pasta, or rice. ?Add nuts or seeds for added healthy fat at each meal. You can add these to yogurt, salads, or vegetable dishes. ?Marinate fish or vegetables using olive oil, lemon juice, garlic, and fresh herbs. ?Meal planning  ?Plan to eat 1 vegetarian meal one day each week. Try to work up to 2 vegetarian meals, if possible. ?Eat seafood 2 or more times a week. ?Have healthy snacks readily available, such as: ?Vegetable sticks with hummus. ?[place] yogurt. ?Fruit and nut trail mix. ?Eat balanced meals throughout the week. This includes: ?Fruit: 2-3 servings a day ?Vegetables: 4-5 servings a day ?Low-fat dairy: 2 servings a day ?Fish, poultry, or lean meat: 1 serving a day ?Beans and  legumes: 2 or more servings a week ?Nuts and seeds: 1-2 servings a day ?Whole grains: 6-8 servings a day ?Extra-virgin olive oil: 3-4 servings a day ?Limit red meat and sweets to only a few servings a month ?What are my food choices? ?Mediterranean diet ?Recommended ?Grains: Whole-grain pasta. Brown rice. Bulgar wheat. Polenta. Couscous. Whole-wheat bread. [place]. ?Vegetables: Artichokes. Beets. Broccoli. Cabbage. Carrots. Eggplant. Green beans. Chard. Kale. Spinach. Onions. Leeks. Peas. Squash. Tomatoes. Peppers.  Radishes. ?Fruits: Apples. Apricots. Avocado. Berries. Bananas. Cherries. Dates. Figs. Grapes. Lemons. Melon. Oranges. Peaches. Plums. Pomegranate. ?Meats and other protein foods: Beans. Almonds. Sunflower seeds.

## 2021-10-14 NOTE — Progress Notes (Signed)
? ?Assessment/Plan:  ? ? ?Memory difficulties ? ?Last MoCA 29/30.  MRI of the brain remarkable for an area of abnormality on the left occipital pole is most suggestive of a subacute cortical infarct without any other acute intracranial abnormalities, but with moderate chronic ischemic disease.  The patient is on aspirin daily.  He is at risk for stroke, given his history of PAF.  Prior labs were normal.  He is on aspirin daily. ? ? ? Recommendations:  ? ?Discussed safety both in and out of the home.  ?Discussed the importance of regular daily schedule with inclusion of crossword puzzles to maintain brain function.  ?Continue to monitor mood by PCP ?Stay active at least 30 minutes at least 3 times a week.  ?Naps should be scheduled and should be no longer than 60 minutes and should not occur after 2 PM.  ?Mediterranean diet is recommended  ?Control cardiovascular risk factors  ?Start Plavix 75 mg daily, to discontinue baby aspirin.  Will contact Dr. Peter Martinique, cardiology, with the findings and the plans, will defer to him if any changes need to be made.  We will order an echocardiogram to further evaluate the structure of the heart, and to evaluate for emboli. ?Recommend to follow-up with cardiology soon ?Care for control of his blood pressure ?No other dementia medication is indicated at this time ?MRI a of the head and neck without contrast, to further evaluate the vascular load for stenosis, occlusions, etc. ? ? ? ? ? ?Subjective:  ? ? ?Nicholas Patten. is a very pleasant 66 y.o. RH male  seen today in follow up for memory loss. This patient is accompanied in the office by his  wife who supplements the history.  Previous records as well as any outside records available were reviewed prior to todays visit.  Patient was last seen at our office 09/16/21 at which time his MoCA was 29/30, not suspicious for dementia.  He is not on antidepressant medications.   ?He reports that his memory is "about the same",  still having some difficulty remembering recent conversations, but he now makes it least to help him.  His short-term memory is worse than his long-term memory.  He may be distracted when driving, at times passing historian.  He still has trouble with multitasking with directions.  He does repeat himself at times. ?He denies being disoriented when walking into a room.  He denies leaving objects in unusual places.  He ambulates without difficulty,  He has some unsteadiness on his feet, especially when taking steps up to the kitchen from the garage.  He has to hold the railing otherwise he would fall, and has a tendency to lean to the left.  He denies any prior history of TIA or stroke.  No upper or lower extremity numbness or tingling.  He also has noticed that for several years he had some right greater than left head bobbing, and his wife reports that sometimes he has some left jerky movements of his arm, but recently he had an EEG, yielding normal results, negative for seizures.  His mood is good, denies depression or irritability.  He does report tossing and turning during sleep, he is unaware if he is acting out.  He does not know if he sleeps well.  He denies remembering any vivid dreams, sleepwalking, hallucinations or paranoia.   His appetite is good, he eats 3 meals a day, drinking plenty fluids during the day .  He is active, he  does occasionally fast to walk during the week.  Denies any hygiene concerns, he is independent of bathing and dressing.  His wife puts the medications out in a pillbox for him while he is in the shower.  They do the finances together, he reports no issues.  He denies any headaches, he had some episodes of double vision, last one recently went back in to go to the mountains, when he "got hot and tired ".  He denies any blurred vision.  Sometimes he has left eye twitching.  He denies any dizziness, anosmia. He denies any tremors.  He denies urine incontinence, retention, constipation  or diarrhea.  He denies a history of sleep apnea, alcohol, tobacco. He reports a history of melanocytic nevus s.p large excision of the scal on the left. He also had noticed a recent rash on the forehead having an appointment  with the dermatologist soon.  ? ? Initial Visit 09/16/21 The patient is seen in neurologic consultation at the request of Wendie Agreste, MD for the evaluation of memory.  The patient is accompanied by his wife  who supplements the history. 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  She states that he cannot remember recent conversations, and within 15 minutes he will ask again.  His short-term memory is worse than the long-term memory.  He denies anyone noticing at work that he has any cognitive difficulties.  She states that he used to be able to drive without difficulties, and now, while driving, he is distracted, and at times will pass the turn.  He cannot multitask with directions.  Despite having a calendar, first in the morning his wife has to put a note in the mirror in the bathroom otherwise he will forget.  She states that he repeats himself more frequently. ?He denies being disoriented when walking into a room.  He denies leaving objects in unusual places.  He ambulates without difficulty,  He has some unsteadiness on his feet, especially when taking steps up to the kitchen from the garage.  He has to hold the railing otherwise he would fall.  This has been present for several months, during the falls, he denies any loss of consciousness, "I only hurt to my pride " he says.  In July, during a family vacation, he states that his feet went unsteady, but then again "the sand was too soft ".  He does report for many years leaning to the left.  He denies any prior history of TIA or stroke.  He is unsure if the blood pressure medications may be  contributing to the sense dizziness.  He denies any lack of sensations on his legs or arms.  He also has noticed that for several years he had some right greater than left head bobbing, and his wife reports that sometimes he has some left jerky movements of his arm.  He denies any history of seizures or PArkinsonism.  His mood is good, denies depression or irritability.  His wife states that he has significant REM behavior at night, moving and tossing, possibly acting out.  He does not know if he sleeps well.  He denies remembering any vivid dreams or sleepwalking.  He denies hallucinations or paranoia. ?History of 2 falls previously, no recent falls.  Reports sensation of his legs not cooperating and losing balance. Had to  catch self once since last visit. No pain, just unsteady.  His appetite is good, he eats 3 meals a day, drinking plenty fluids during the day .  He is active, he does occasionally fast to walk during the week.  Denies any hygiene concerns, he is independent of bathing and dressing.  His wife puts the medications out in a pillbox for him while he is in the shower.  They do the finances together, he reports no issues.  He was concerned recently, because at work, he could not remember the password in his computer, needing to get it out of his own personal computer.  He denies any headaches, he had some episodes of double vision, last one recently went back in to go to the mountains, when he "got hot and tired ".  He denies any blurred vision.  He denies any dizziness, focal numbness or tingling, or anosmia.  No history of seizures.  He denies any tremors.  He denies urine incontinence, retention, constipation or diarrhea.  He denies a history of sleep apnea, alcohol, tobacco. He reports a history of melanocytic nevus s.p large excision of the scal on the left. He also had noticed a recent rash on the forehead. He has an appt with the dermatologist soon. His sister died with the advanced dementia at  3, father with Alzheimer's disease at 4. ? ? MRI brain without contrast 10/10/21 Moderate for age cerebral white matter signal changes are nonspecific but most commonly due to chronic small vessel disease. A

## 2021-10-19 ENCOUNTER — Other Ambulatory Visit: Payer: Self-pay

## 2021-10-19 ENCOUNTER — Encounter: Payer: Self-pay | Admitting: Dermatology

## 2021-10-19 ENCOUNTER — Ambulatory Visit: Payer: Medicare Other | Admitting: Dermatology

## 2021-10-19 ENCOUNTER — Other Ambulatory Visit: Payer: Self-pay | Admitting: Dermatology

## 2021-10-19 DIAGNOSIS — L219 Seborrheic dermatitis, unspecified: Secondary | ICD-10-CM | POA: Diagnosis not present

## 2021-10-19 DIAGNOSIS — Z1283 Encounter for screening for malignant neoplasm of skin: Secondary | ICD-10-CM

## 2021-10-19 DIAGNOSIS — C44311 Basal cell carcinoma of skin of nose: Secondary | ICD-10-CM | POA: Diagnosis not present

## 2021-10-19 DIAGNOSIS — Z85828 Personal history of other malignant neoplasm of skin: Secondary | ICD-10-CM

## 2021-10-19 DIAGNOSIS — L570 Actinic keratosis: Secondary | ICD-10-CM

## 2021-10-19 DIAGNOSIS — Z86018 Personal history of other benign neoplasm: Secondary | ICD-10-CM

## 2021-10-19 DIAGNOSIS — D485 Neoplasm of uncertain behavior of skin: Secondary | ICD-10-CM

## 2021-10-19 DIAGNOSIS — L309 Dermatitis, unspecified: Secondary | ICD-10-CM

## 2021-10-19 DIAGNOSIS — L821 Other seborrheic keratosis: Secondary | ICD-10-CM

## 2021-10-19 MED ORDER — TACROLIMUS 0.1 % EX OINT: 1.0000 "application " | TOPICAL_OINTMENT | Freq: Every day | CUTANEOUS | 2 refills | Status: DC

## 2021-10-19 NOTE — Patient Instructions (Signed)

## 2021-10-24 ENCOUNTER — Telehealth: Payer: Self-pay | Admitting: *Deleted

## 2021-10-24 ENCOUNTER — Telehealth: Payer: Self-pay | Admitting: Dermatology

## 2021-10-24 NOTE — Telephone Encounter (Signed)
-----   Message from Lavonna Monarch, MD sent at 10/21/2021  4:45 AM EDT ----- ?Schedule surgery with Dr. Darene Lamer put I plan on discussing Mohs surgery with him at that time. ?

## 2021-10-24 NOTE — Telephone Encounter (Signed)
[employer] called and said the prior auth for Tacrolimus ointment has been approved. ?

## 2021-10-24 NOTE — Telephone Encounter (Signed)
Pathology results to patient- referral sent to [facility] for [facility].  ?

## 2021-10-24 NOTE — Telephone Encounter (Signed)
Prior authorization done via cover my meds for patients tacrolimus. Waiting on determination.  ? ?[facility] - Rx #: [id number] ?

## 2021-10-25 ENCOUNTER — Ambulatory Visit (HOSPITAL_COMMUNITY)
Admission: RE | Admit: 2021-10-25 | Discharge: 2021-10-25 | Disposition: A | Discharge status: Home / Self Care | Payer: Medicare Other | Source: Ambulatory Visit | Attending: Physician Assistant | Admitting: Physician Assistant

## 2021-10-25 ENCOUNTER — Other Ambulatory Visit: Payer: Self-pay

## 2021-10-25 DIAGNOSIS — I6789 Other cerebrovascular disease: Secondary | ICD-10-CM

## 2021-10-25 DIAGNOSIS — I4891 Unspecified atrial fibrillation: Secondary | ICD-10-CM | POA: Diagnosis not present

## 2021-10-25 DIAGNOSIS — I639 Cerebral infarction, unspecified: Secondary | ICD-10-CM | POA: Insufficient documentation

## 2021-10-25 LAB — ECHOCARDIOGRAM COMPLETE
Area-P 1/2: 5.88 cm2
S' Lateral: 2.7 cm

## 2021-10-26 ENCOUNTER — Ambulatory Visit
Admission: RE | Admit: 2021-10-26 | Discharge: 2021-10-26 | Disposition: A | Discharge status: Home / Self Care | Payer: Medicare Other | Source: Ambulatory Visit | Attending: Physician Assistant | Admitting: Physician Assistant

## 2021-10-26 DIAGNOSIS — I639 Cerebral infarction, unspecified: Secondary | ICD-10-CM

## 2021-10-26 DIAGNOSIS — Z8673 Personal history of transient ischemic attack (TIA), and cerebral infarction without residual deficits: Secondary | ICD-10-CM | POA: Diagnosis not present

## 2021-10-26 IMAGING — MR MR MRA NECK WO/W CM
3 of 4 series · 29 of 48 positions shown · IV contrast (17 ml multihance)
Comparison: MRI brain [DATE]. Same day MRA head [DATE].

CLINICAL DATA: Provided history: Cerebrovascular accident,
unspecified mechanism. Stroke, follow-up. Additional history
provided by scanning technologist: Hand tremor, difficulty walking,
memory loss.

EXAM:
MRA NECK WITHOUT AND WITH CONTRAST
TECHNIQUE: Multiplanar and multiecho pulse sequences of the neck were obtained
without and with intravenous contrast. Angiographic images of the
neck were obtained using MRA technique without and with intravenous
contrast.
CONTRAST:  17mL MULTIHANCE GADOBENATE DIMEGLUMINE 529 MG/ML IV SOLN

[Series 4: fl_tof_2d · axial · 3.0mm · 0.39mm/px · z∈[-266,-68]mm · 13 of 100 slices shown]
[im 1/100]
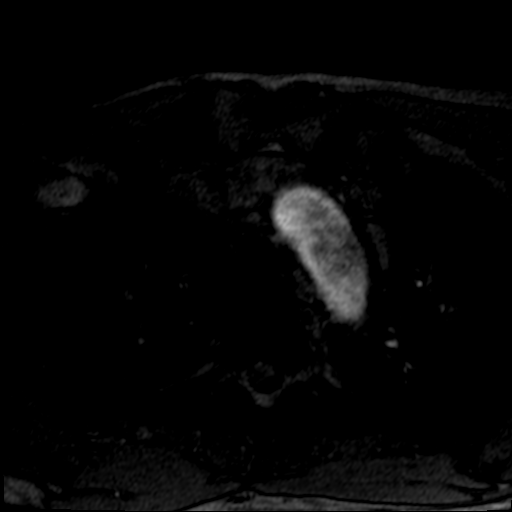
[im 8/100]
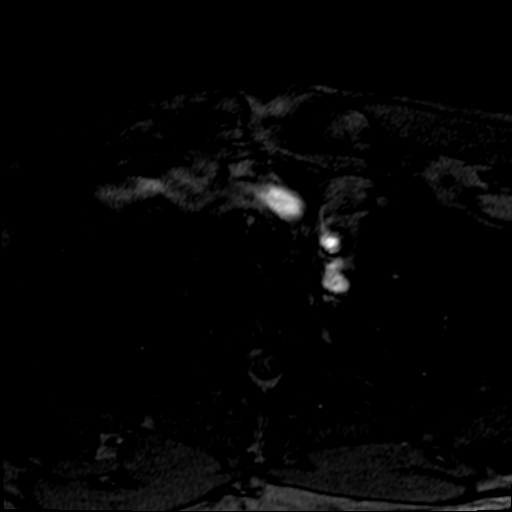
[im 15/100]
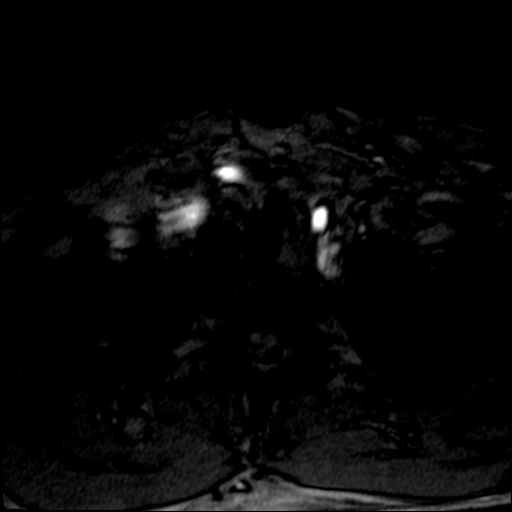
[im 22/100]
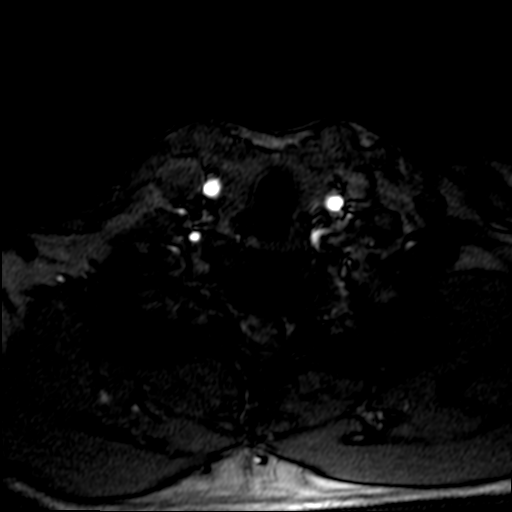
[im 29/100]
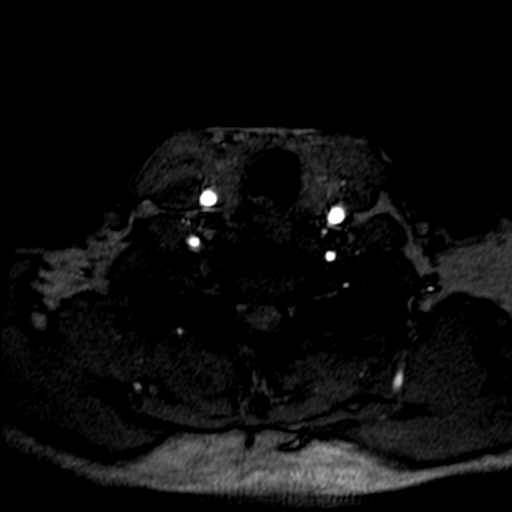
[im 36/100]
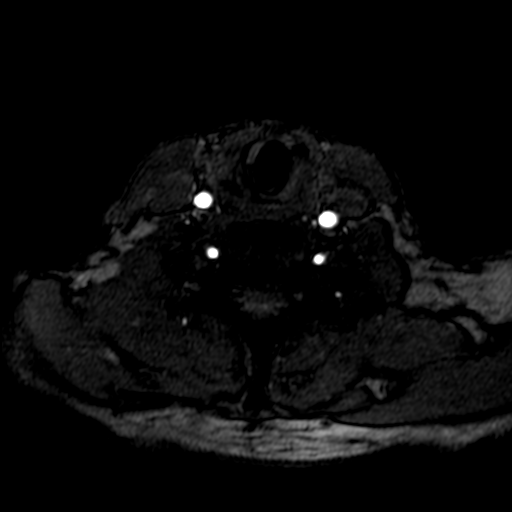
[im 43/100]
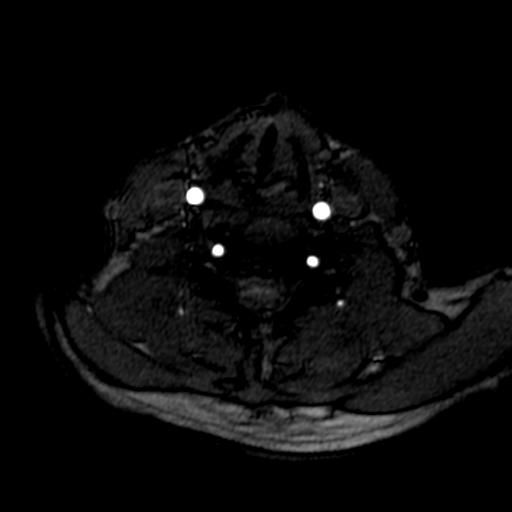
[im 50/100]
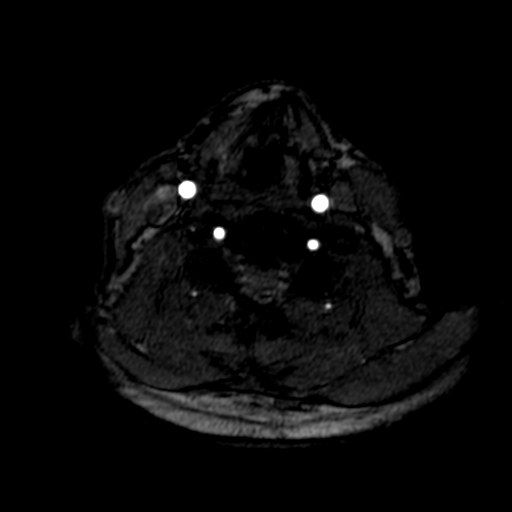
[im 57/100]
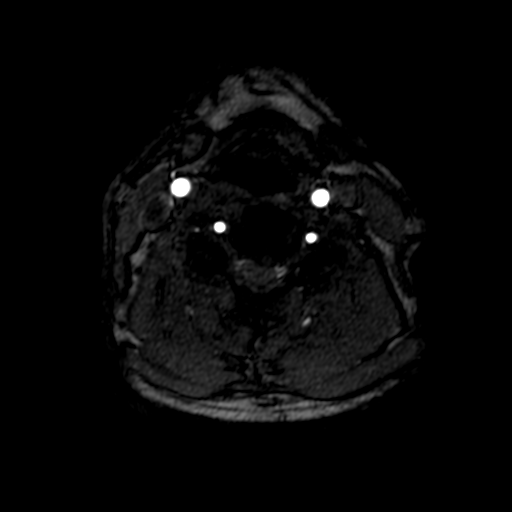
[im 64/100]
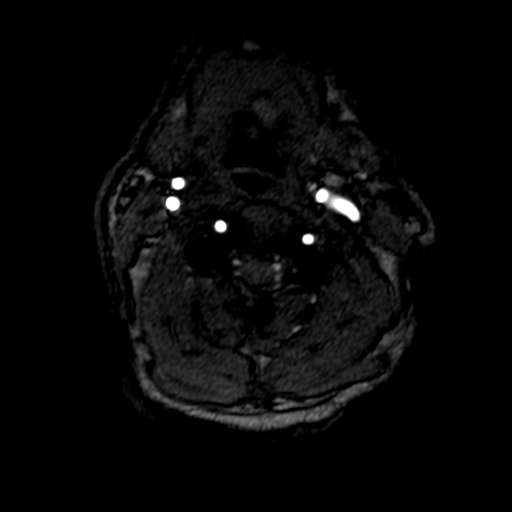
[im 71/100]
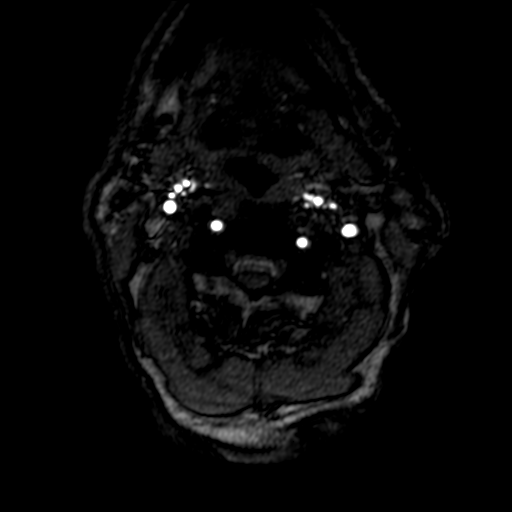
[im 85/100]
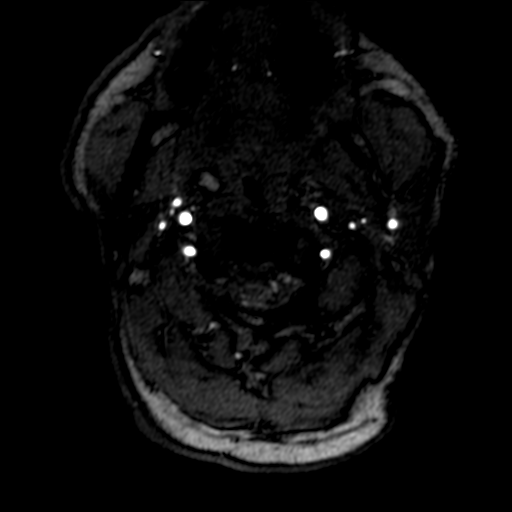
[im 100/100]
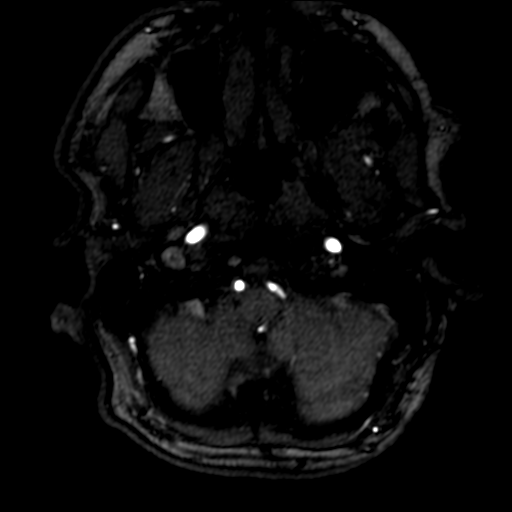

[Series 8: (id)_tt=1.0s · coronal · 0.8mm · 1.04mm/px · 8 of 88 slices shown (1 of 2)]
[im 1/88]
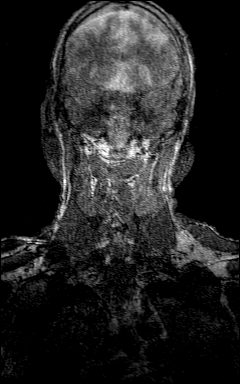
[im 14/88]
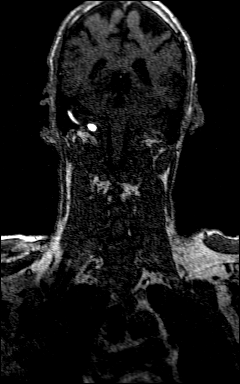
[im 27/88]
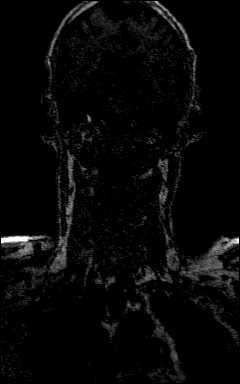
[im 41/88]
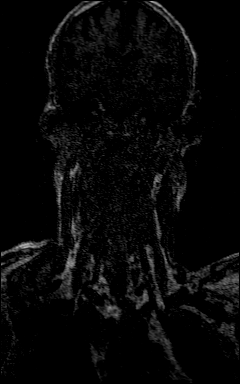
[im 47/88]
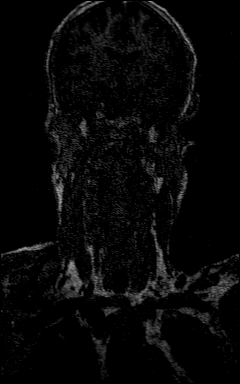
[im 61/88]
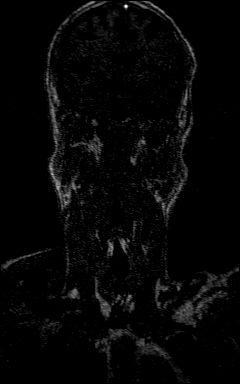
[im 74/88]
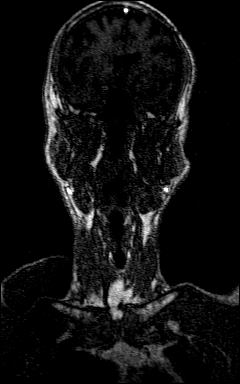
[im 88/88]
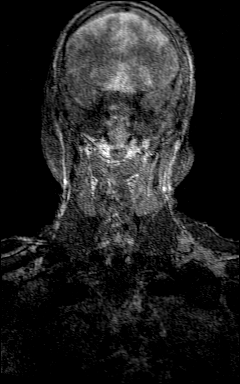

[Series 10: (id)_tt=1.0s · coronal · 0.8mm · 1.04mm/px · 8 of 88 slices shown (2 of 2)]
[im 1/88]
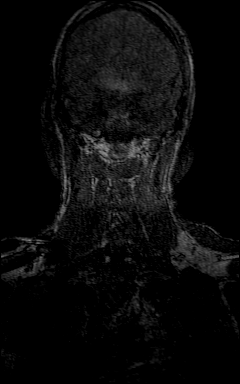
[im 14/88]
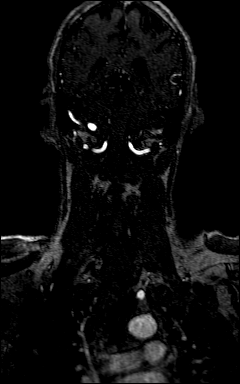
[im 27/88]
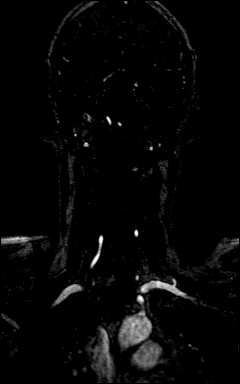
[im 41/88]
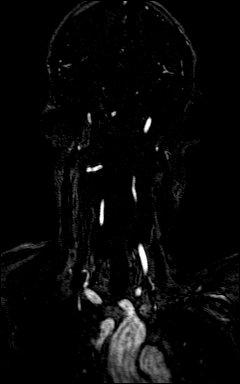
[im 47/88]
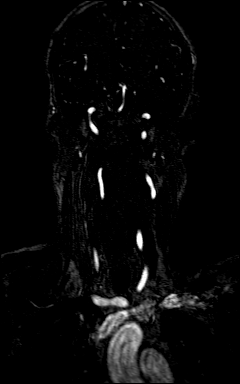
[im 61/88]
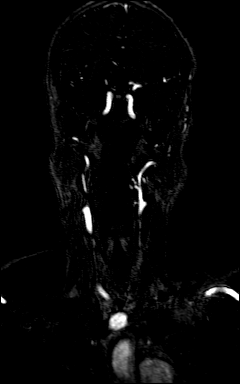
[im 74/88]
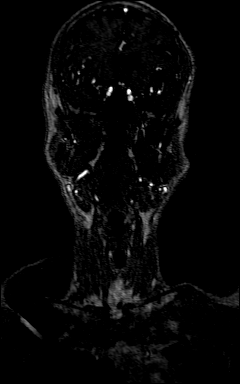
[im 88/88]
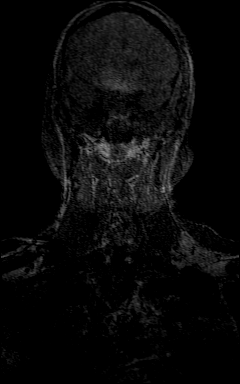

[29 of 48 positions shown; findings below may reference images not displayed]

FINDINGS: Standard aortic branching. The visualized aortic arch is normal in
caliber. No hemodynamically significant innominate or proximal
subclavian artery stenosis. The common carotid and internal carotid
arteries are patent within the neck without appreciable stenosis.
The vertebral arteries are codominant and patent within the neck
without appreciable stenosis.
IMPRESSION: The common carotid, internal carotid and vertebral arteries are
patent within the neck without appreciable stenosis.

## 2021-10-26 IMAGING — MR MR MRA HEAD W/O CM
1 series · 23 of 48 positions shown · non-contrast
Comparison: Brain MRI [DATE]. Same day MRA of the neck
[DATE].

CLINICAL DATA: Provided history: Cerebrovascular accident (CVA),
unspecified mechanism. Stroke, follow-up. Additional history
provided by scanning technologist: Hand tremor, difficulty walking,
memory loss.

EXAM:
MRA HEAD WITHOUT CONTRAST
TECHNIQUE: Angiographic images of the Circle of Willis were acquired using MRA
technique without intravenous contrast.

[Series 3: tof_3d_multi-slab · axial · 0.7mm · 0.39mm/px · z∈[-86,+18]mm · 23 of 156 slices shown]
[im 1/156]
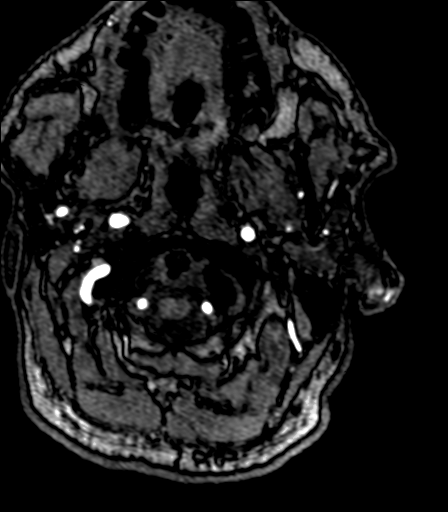
[im 4/156]
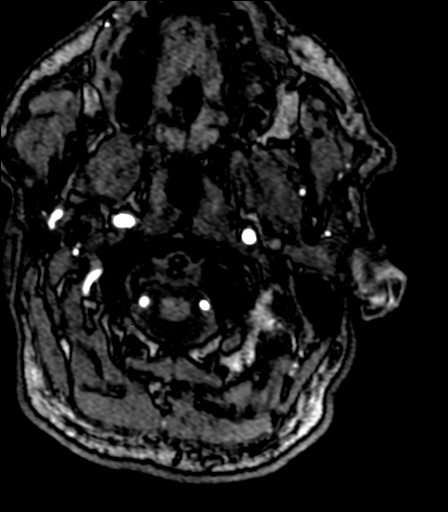
[im 7/156]
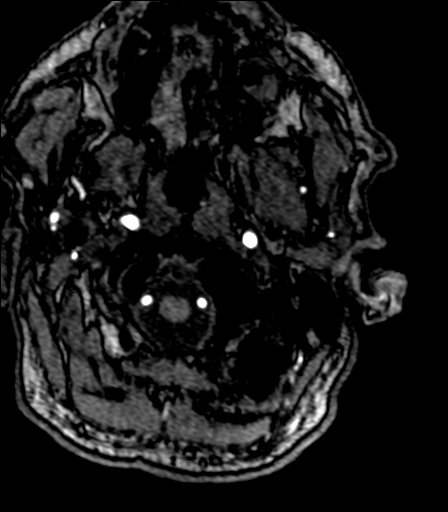
[im 10/156]
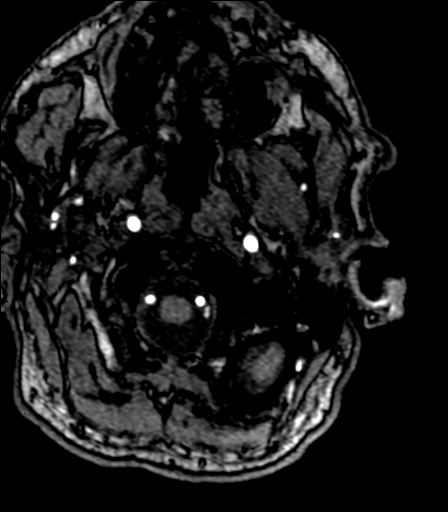
[im 14/156]
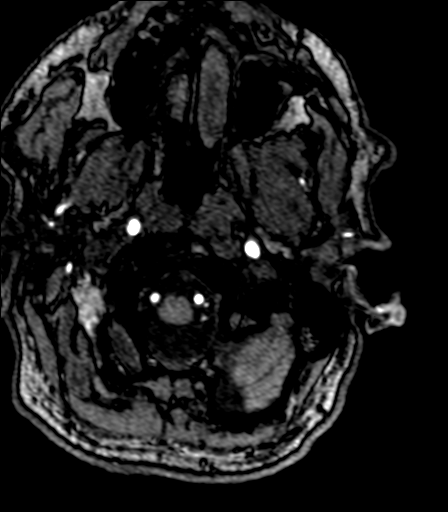
[im 17/156]
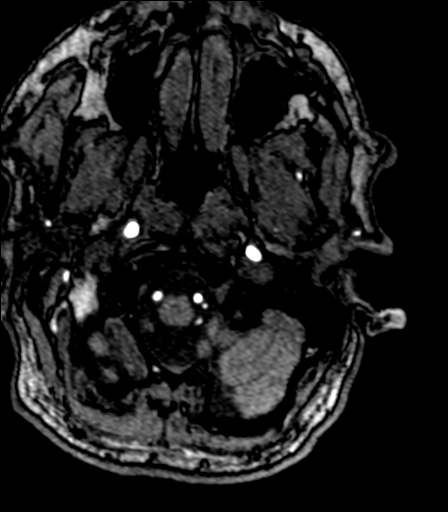
[im 20/156]
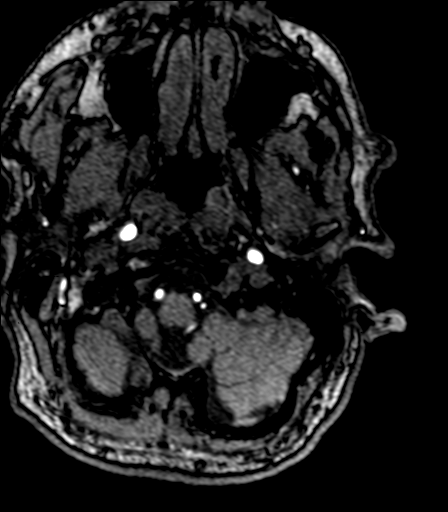
[im 24/156]
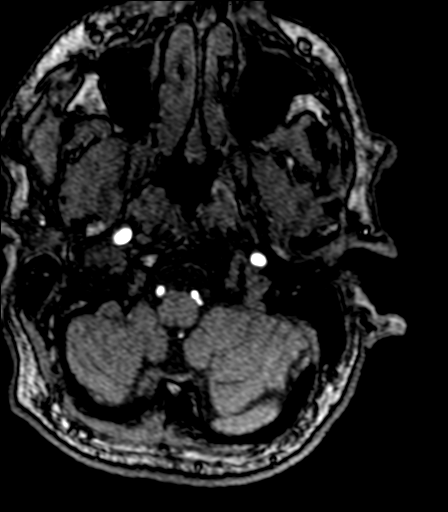
[im 27/156]
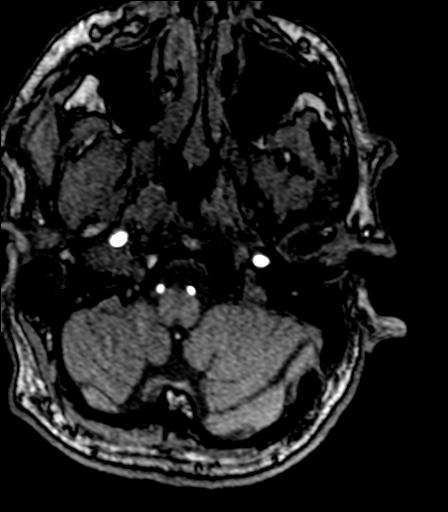
[im 30/156]
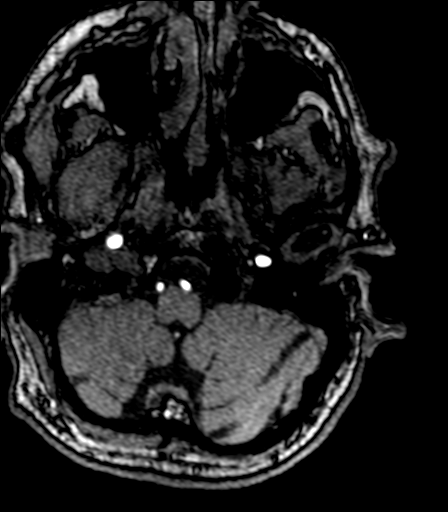
[im 33/156]
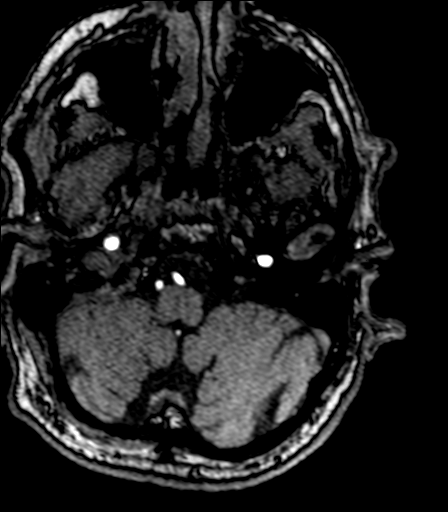
[im 37/156]
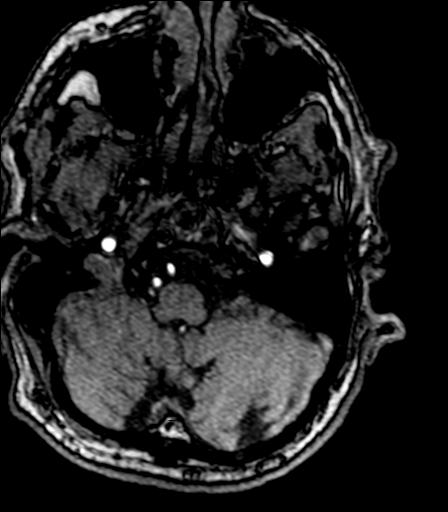
[im 40/156]
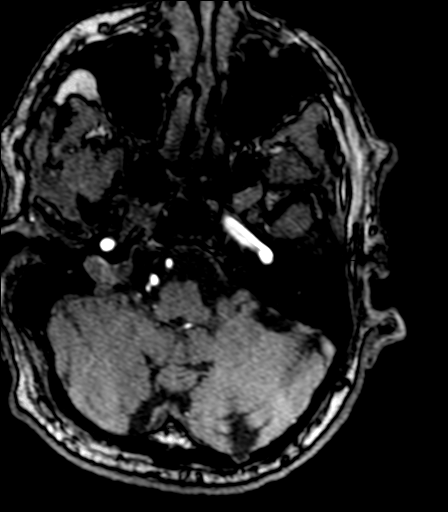
[im 43/156]
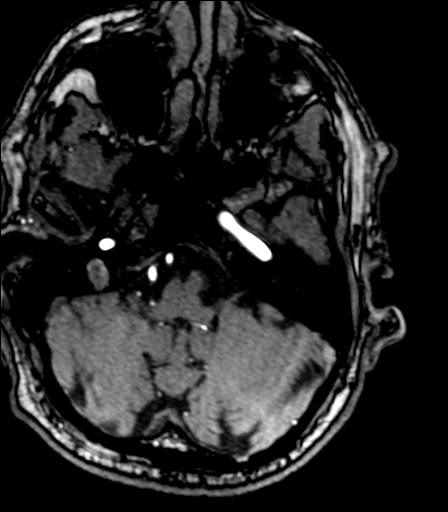
[im 47/156]
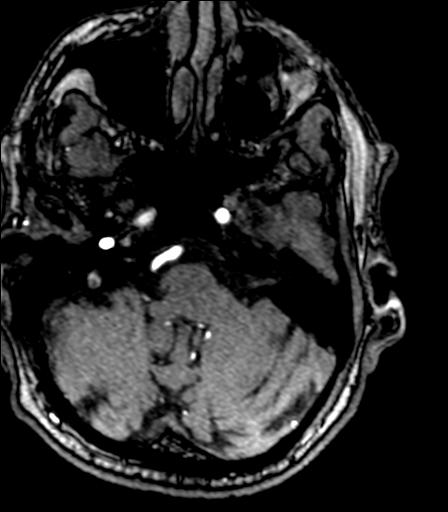
[im 50/156]
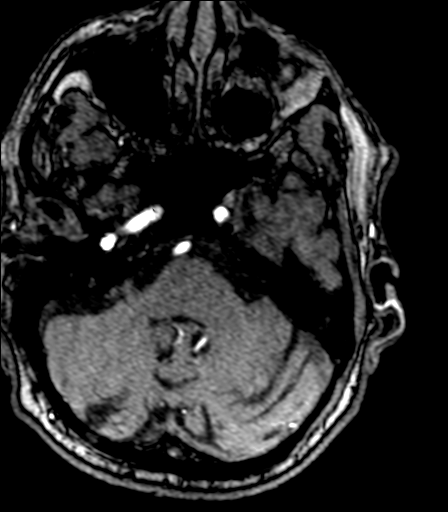
[im 70/156]
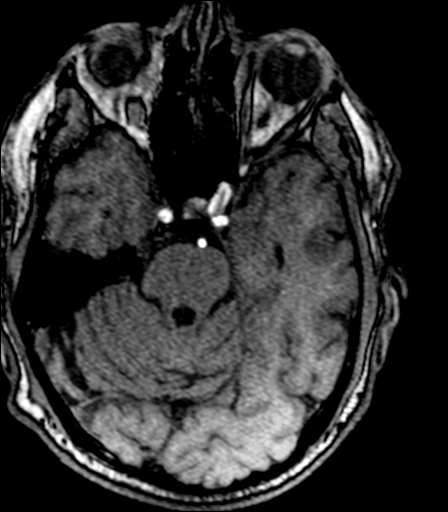
[im 80/156]
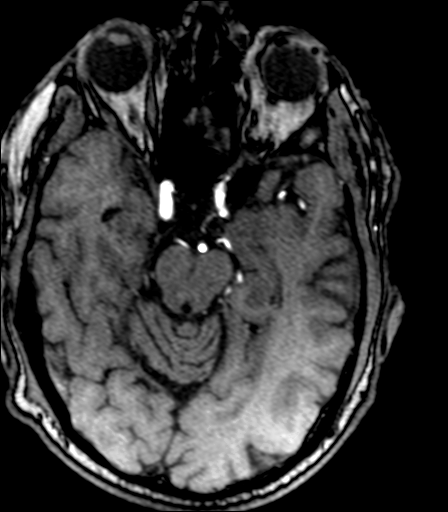
[im 90/156]
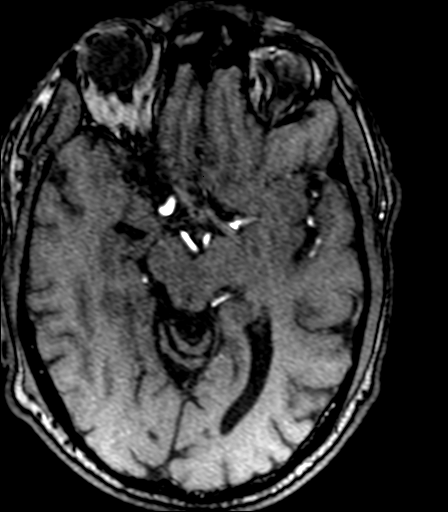
[im 109/156]
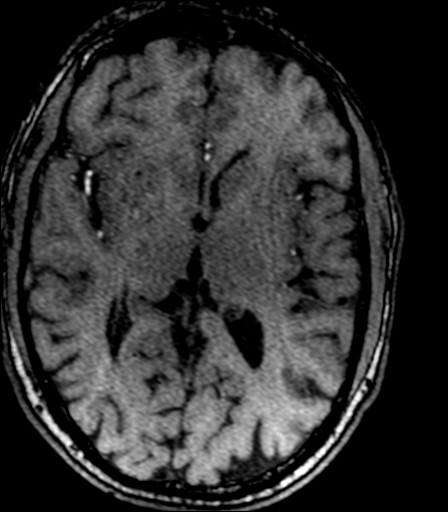
[im 129/156]
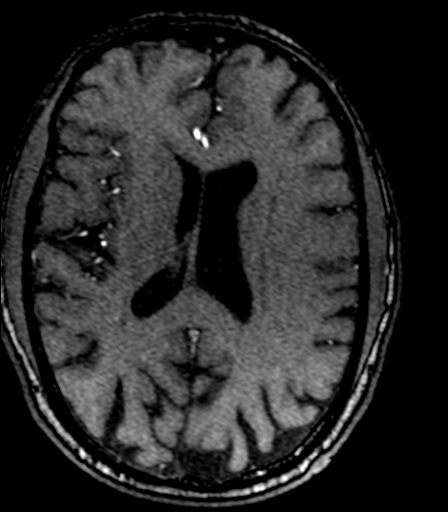
[im 132/156]
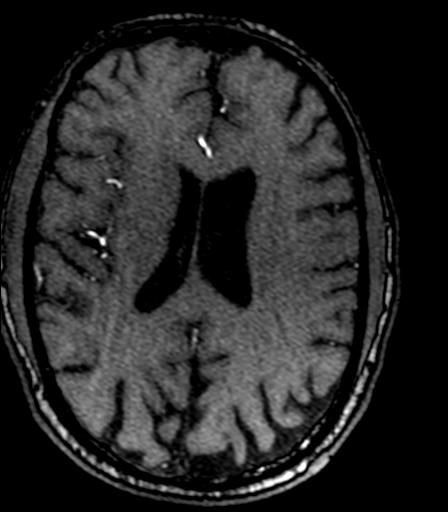
[im 149/156]
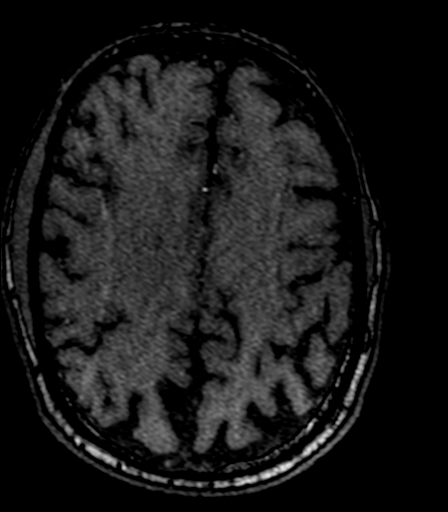

[23 of 48 positions shown; findings below may reference images not displayed]

FINDINGS: Anterior circulation:

The intracranial internal carotid arteries are patent. The M1 middle
cerebral arteries are patent. No M2 proximal branch occlusion or
high-grade proximal stenosis is identified. The anterior cerebral
arteries are patent. Apparent moderate stenosis within the A2 right
anterior cerebral artery (series 3, image 105). No intracranial
aneurysm is identified.

Posterior circulation:

The intracranial vertebral arteries are patent. The basilar artery
is patent. The posterior cerebral arteries are patent. Posterior
communicating arteries are present bilaterally.

Anatomic variants: None significant.
IMPRESSION: No intracranial large vessel occlusion.

Apparent moderate stenosis within the A2 segment of the right
anterior cerebral artery.

## 2021-10-26 MED ORDER — GADOBENATE DIMEGLUMINE 529 MG/ML IV SOLN
17.0000 mL | Freq: Once | INTRAVENOUS | Status: AC | PRN
  Administered 2021-10-26: 17 mL via INTRAVENOUS

## 2021-10-28 NOTE — Progress Notes (Signed)
Please inform patient that spoke with his Cardiologist and he wants to have him started on anticoagulation for his atrial fibrillation and the narrowing of some of the arteries in the brain to prevent a stroke, he needs to contact the cardiologist and make an appt earlier than the one scheduled late this year. The earlier the better. Plavix is good, but e needs something stronger. Recommend close follow up.

## 2021-10-31 ENCOUNTER — Telehealth: Payer: Self-pay | Admitting: Physician Assistant

## 2021-10-31 NOTE — Telephone Encounter (Signed)
Pt called in returning Christy's call about some results ?

## 2021-10-31 NOTE — Telephone Encounter (Signed)
Voicemail again ?  ?

## 2021-11-01 NOTE — Telephone Encounter (Signed)
Pt is returning a call to christy. He said please call his cell phone  ?

## 2021-11-01 NOTE — Telephone Encounter (Signed)
Voicemail again ?  ?

## 2021-11-02 NOTE — Telephone Encounter (Signed)
Patient called back, said to try [phone] if you cant get on cell phone. ?

## 2021-11-02 NOTE — Telephone Encounter (Signed)
Tried on cell phone, just rings, sent my chart message  ?

## 2021-11-02 NOTE — Telephone Encounter (Signed)
Patient advised of MRA results, voiced understanding and thanked me for calling.   ?

## 2021-11-03 ENCOUNTER — Encounter: Payer: Self-pay | Admitting: Cardiology

## 2021-11-03 ENCOUNTER — Ambulatory Visit: Payer: Medicare Other | Admitting: Cardiology

## 2021-11-03 VITALS — BP 130/80 | HR 70 | Ht 73.0 in | Wt 183.6 lb

## 2021-11-03 DIAGNOSIS — I639 Cerebral infarction, unspecified: Secondary | ICD-10-CM

## 2021-11-03 DIAGNOSIS — I480 Paroxysmal atrial fibrillation: Secondary | ICD-10-CM

## 2021-11-03 MED ORDER — ROSUVASTATIN CALCIUM 10 MG PO TABS
10.0000 mg | ORAL_TABLET | Freq: Every day | ORAL | 3 refills | Status: DC
Start: 2021-11-03 — End: 2022-10-20

## 2021-11-03 MED ORDER — APIXABAN 5 MG PO TABS
5.0000 mg | ORAL_TABLET | Freq: Two times a day (BID) | ORAL | 3 refills | Status: DC
Start: 2021-11-03 — End: 2022-11-07

## 2021-11-03 NOTE — Patient Instructions (Addendum)
Stop Plavix and ASA ? ?Start Eliquis 5 mg twice a day ? ?Start Crestor 10 mg daily ? ?We will check lab work in 3 months ? ? ? ?

## 2021-11-03 NOTE — Progress Notes (Signed)
? ?Nicholas West. ?Date of Birth: 1956-04-28 ?Medical Record #[id number] ? ?History of Present Illness: ?Nicholas West is seen for follow up of atrial fibrillation.   He has a history of paroxysmal atrial fibrillation. He has been managed with rate control with diltiazem. He previously had a [place] vascular score of 0 and was  taking a baby aspirin daily.  ? ?In 2019 he complained of nausea and tingling in his arms with exercise. We arranged a POET. On arrival he was in AFib with rate 98. He exercised for 9 minutes on the Bruce protocol without symptoms or ST changes. He converted to NSR then went back into atrial flutter with RVR. His Cardizem was increased to 240 mg daily.   ? ?Recently he was experiencing some issues with his balance and memory loss. He had a couple of falls. Was evaluated by Dr Shawn Route. EEG and extensive lab work OK. Cranial MRI showed evidence of subacute infarct in the  left occipital pole. MRA showed modest stenosis in the A2 segment of the right anterior cerebral artery. He denies any chest pain, palpitations. No dyspnea.  ? ?Current Outpatient Medications on File Prior to Visit  ?Medication Sig Dispense Refill  ? B-D 3CC LUER-LOK SYR 23GX1" 23G X 1" 3 ML MISC SMARTSIG:1 Injection Once a Week    ? B-D HYPODERMIC NEEDLE 18GX1.5" 18G X 1-1/2" MISC USE TO DRAW UP MEDICATION    ? calcium carbonate (OS-CAL - DOSED IN MG OF ELEMENTAL CALCIUM) 1250 MG tablet Take 2 tablets by mouth 2 (two) times daily with a meal.     ? cholecalciferol (VITAMIN D) 1000 UNITS tablet Take 1,000 Units by mouth 2 (two) times daily.    ? diltiazem (CARDIZEM CD) 240 MG 24 hr capsule TAKE 1 CAPSULE(240 MG) BY MOUTH DAILY 90 capsule 3  ? Docusate Sodium (COLACE PO) Take 1 tablet by mouth as directed.    ? Multiple Vitamin (MULTIVITAMIN) tablet Take 1 tablet by mouth daily.    ? Omega-3 Fatty Acids (OMEGA 3 PO) Take 2 capsules by mouth daily.     ? omeprazole (PRILOSEC) 20 MG capsule Take 1 capsule (20 mg total) by mouth daily.  30 capsule 11  ? Probiotic Product (PROBIOTIC-10 PO) Take 1 tablet by mouth daily.    ? sildenafil (VIAGRA) 100 MG tablet Take 100 mg by mouth as needed.    ? tacrolimus (PROTOPIC) 0.1 % ointment Apply 1 application. topically daily. 100 g 2  ? testosterone cypionate (DEPOTESTOSTERONE CYPIONATE) 200 MG/ML injection Inject into the muscle once a week.     ? ?No current facility-administered medications on file prior to visit.  ? ? ?Allergies  ?Allergen Reactions  ? Penicillins   ?  rash  ? ? ?Past Medical History:  ?Diagnosis Date  ? Abdominal pain   ? Atrial fibrillation ([place])   ? BCC (basal cell carcinoma) 09/14/2010  ? ulcerated, top right scalp, Treatment Zyclara  ? BCC (basal cell carcinoma) 02/05/2013  ? Recurrent, top right scalp, MOHs  ? BCC (basal cell carcinoma) 09/05/2017  ? Mico nod, left forehead, MOHs (Dr Link Snuffer)  ? Chest pain   ? negative stress echo in September 2013  ? Diverticular disease   ? Diverticulitis   ? Dysplastic nevus   ? moderate, Left back no treatment  ? GERD (gastroesophageal reflux disease)   ? ? ?Past Surgical History:  ?Procedure Laterality Date  ? COLON SURGERY    ? PARTIAL COLECTOMY  11/11  ? ? ?Social  History  ? ?Tobacco Use  ?Smoking Status Never  ?Smokeless Tobacco Never  ? ? ?Social History  ? ?Substance and Sexual Activity  ?Alcohol Use No  ? ? ?Family History  ?Problem Relation Age of Onset  ? Cancer Mother   ? Alzheimer's disease Father   ? Colon polyps Father   ? GER disease Father   ? Heart disease Paternal Grandmother   ? Cancer Paternal Grandfather   ? ? ?Review of Systems: ?The review of systems is per the HPI.  All other systems were reviewed and are negative. ? ?Physical Exam: ?BP 130/80 (BP Location: Left Arm)   Pulse 70   Ht '6\' 1"'$  (1.854 m)   Wt 183 lb 9.6 oz (83.3 kg)   SpO2 96%   BMI 24.22 kg/m?  ?GENERAL:  Well appearing WM in NAD ?HEENT:  PERRL, EOMI, sclera are clear. Oropharynx is clear. ?NECK:  No jugular venous distention, carotid upstroke brisk and  symmetric, no bruits, no thyromegaly or adenopathy ?LUNGS:  Clear to auscultation bilaterally ?CHEST:  Unremarkable ?HEART:  RRR,  PMI not displaced or sustained,S1 and S2 within normal limits, no S3, no S4: no clicks, no rubs, no murmurs ?ABD:  Soft, nontender. BS +, no masses or bruits. No hepatomegaly, no splenomegaly ?EXT:  2 + pulses throughout, no edema, no cyanosis no clubbing ?SKIN:  Warm and dry.  No rashes ?NEURO:  Alert and oriented x 3. Cranial nerves II through XII intact. ?PSYCH:  Cognitively intact ? ?LABORATORY DATA:  ? ?Lab Results  ?Component Value Date  ? WBC 5.5 09/16/2021  ? HGB 16.4 09/16/2021  ? HCT 47.4 09/16/2021  ? PLT 169.0 09/16/2021  ? GLUCOSE 81 09/16/2021  ? CHOL 160 05/22/2019  ? TRIG 108 05/22/2019  ? HDL 43 05/22/2019  ? Morgan 97 05/22/2019  ? ALT 42 09/16/2021  ? AST 36 09/16/2021  ? NA 139 09/16/2021  ? K 4.3 09/16/2021  ? CL 102 09/16/2021  ? CREATININE 0.90 09/16/2021  ? BUN 12 09/16/2021  ? CO2 34 (H) 09/16/2021  ? TSH 2.05 09/16/2021  ? PSA 1.38 10/29/2015  ? INR 1.10 06/04/2013  ? HGBA1C 5.4 08/04/2021  ? ? ? ?ETT 08/31/17: Study Highlights  ? ?Blood pressure demonstrated a blunted response to exercise. ?Patient started the test in atrial fibrillation and converted to sinus rhythm and subsequently atrial flutter with rapid ventricular response with exercise. ?There was no ST segment deviation noted during stress. ?Negative, adequate stress test.  ? ?Echo 10/25/21: IMPRESSIONS  ? ? ? 1. Left ventricular ejection fraction, by estimation, is 60%. Left  ?ventricular ejection fraction by 3D volume is 58 %. The left ventricle has  ?normal function. The left ventricle has no regional wall motion  ?abnormalities. Left ventricular diastolic  ?parameters are consistent with Grade I diastolic dysfunction (impaired  ?relaxation). The average left ventricular global longitudinal strain is  ?-20.0 %. The global longitudinal strain is normal.  ? 2. Right ventricular systolic function is  normal. The right ventricular  ?size is normal. Tricuspid regurgitation signal is inadequate for assessing  ?PA pressure.  ? 3. The mitral valve is normal in structure. Trivial mitral valve  ?regurgitation. No evidence of mitral stenosis.  ? 4. The aortic valve is tricuspid. Aortic valve regurgitation is not  ?visualized. No aortic stenosis is present.  ? 5. The inferior vena cava is normal in size with greater than 50%  ?respiratory variability, suggesting right atrial pressure of 3 mmHg.  ? ?Comparison(s): No  prior Echocardiogram from records available.  ? ?Conclusion(s)/Recommendation(s): No intracardiac source of embolism  ?detected on this transthoracic study. Consider a transesophageal  ?echocardiogram to exclude cardiac source of embolism if clinically  ?indicated.  ? ?Assessment / Plan: ?1. Paroxysmal atrial fibrillation and flutter. Patient is minimally symptomatic. Now with evidence of CVA on MRI his  [place] vascular score is 2. I would recommend he go on anticoagulation with Eliquis 5 mg bid.  Continue  diltiazem daily. Should stop ASA and Plavix. Avoid NSAIDs. ?2. Prior CVA on MRI ?3. HLD. Prior LDL 97 but hasn't been checked recently. Recommend he go on statin given cerebrovascular disease. Will start Crestor 10 mg daily. Repeat labs in 3 months. Discussed importance of heart healthy diet and regular exercise.  ? ? ?Follow up in 6 months.  ? ?

## 2021-11-05 ENCOUNTER — Encounter: Payer: Self-pay | Admitting: Dermatology

## 2021-11-06 ENCOUNTER — Emergency Department (HOSPITAL_BASED_OUTPATIENT_CLINIC_OR_DEPARTMENT_OTHER): Payer: Medicare Other

## 2021-11-06 ENCOUNTER — Other Ambulatory Visit: Payer: Self-pay

## 2021-11-06 ENCOUNTER — Emergency Department (HOSPITAL_BASED_OUTPATIENT_CLINIC_OR_DEPARTMENT_OTHER): Payer: Medicare Other | Admitting: Radiology

## 2021-11-06 ENCOUNTER — Emergency Department (HOSPITAL_BASED_OUTPATIENT_CLINIC_OR_DEPARTMENT_OTHER)
Admission: EM | Admit: 2021-11-06 | Discharge: 2021-11-06 | Disposition: A | Discharge status: Home / Self Care | Payer: Medicare Other | Attending: Emergency Medicine | Admitting: Emergency Medicine

## 2021-11-06 ENCOUNTER — Encounter (HOSPITAL_BASED_OUTPATIENT_CLINIC_OR_DEPARTMENT_OTHER): Payer: Self-pay

## 2021-11-06 DIAGNOSIS — W19XXXA Unspecified fall, initial encounter: Secondary | ICD-10-CM | POA: Insufficient documentation

## 2021-11-06 DIAGNOSIS — S0990XA Unspecified injury of head, initial encounter: Secondary | ICD-10-CM | POA: Diagnosis not present

## 2021-11-06 DIAGNOSIS — S46912A Strain of unspecified muscle, fascia and tendon at shoulder and upper arm level, left arm, initial encounter: Secondary | ICD-10-CM | POA: Diagnosis not present

## 2021-11-06 DIAGNOSIS — Z7901 Long term (current) use of anticoagulants: Secondary | ICD-10-CM | POA: Diagnosis not present

## 2021-11-06 DIAGNOSIS — M25512 Pain in left shoulder: Secondary | ICD-10-CM | POA: Insufficient documentation

## 2021-11-06 IMAGING — DX DG SHOULDER 2+V*L*
3 series · 3 of 3 positions shown · non-contrast
Comparison: None.

CLINICAL DATA: Shoulder Pain (L) from Fall.

EXAM:
LEFT SHOULDER - 2+ VIEW

[shoulder grashey]
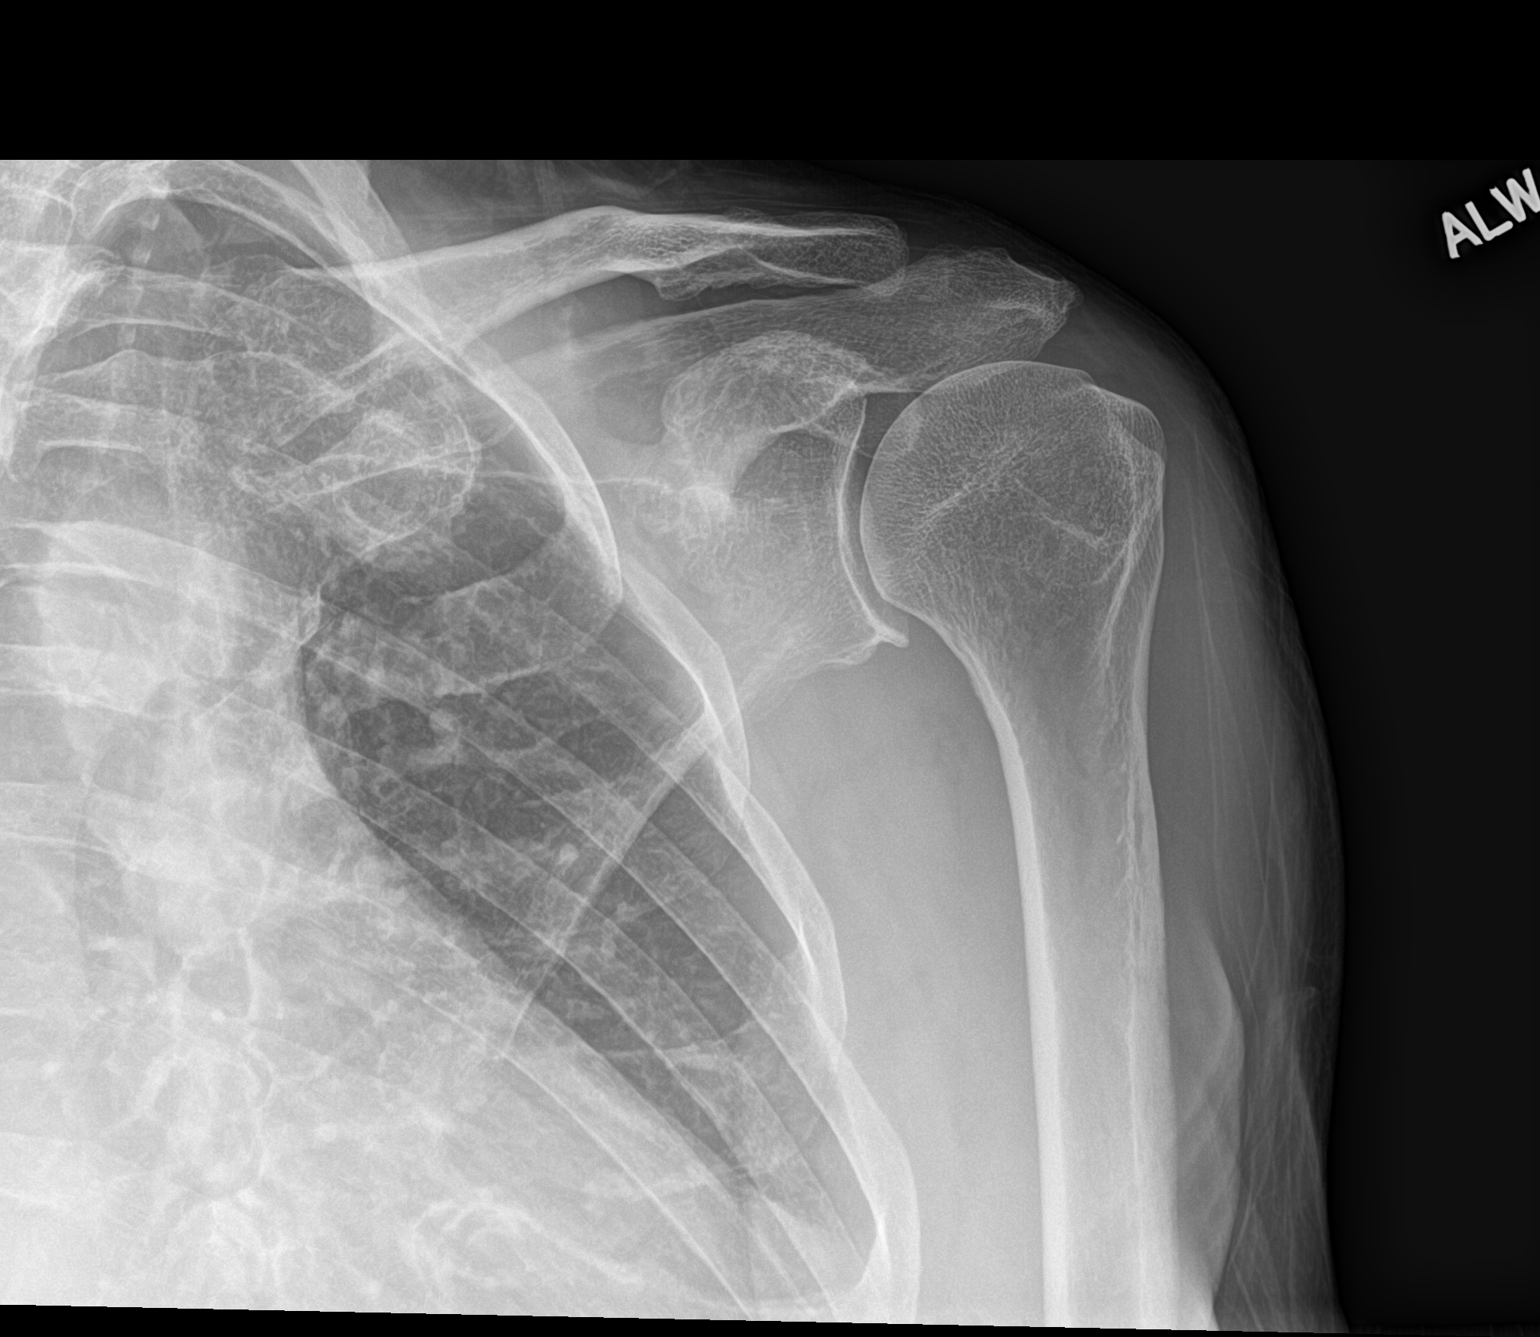

[shoulder y view]
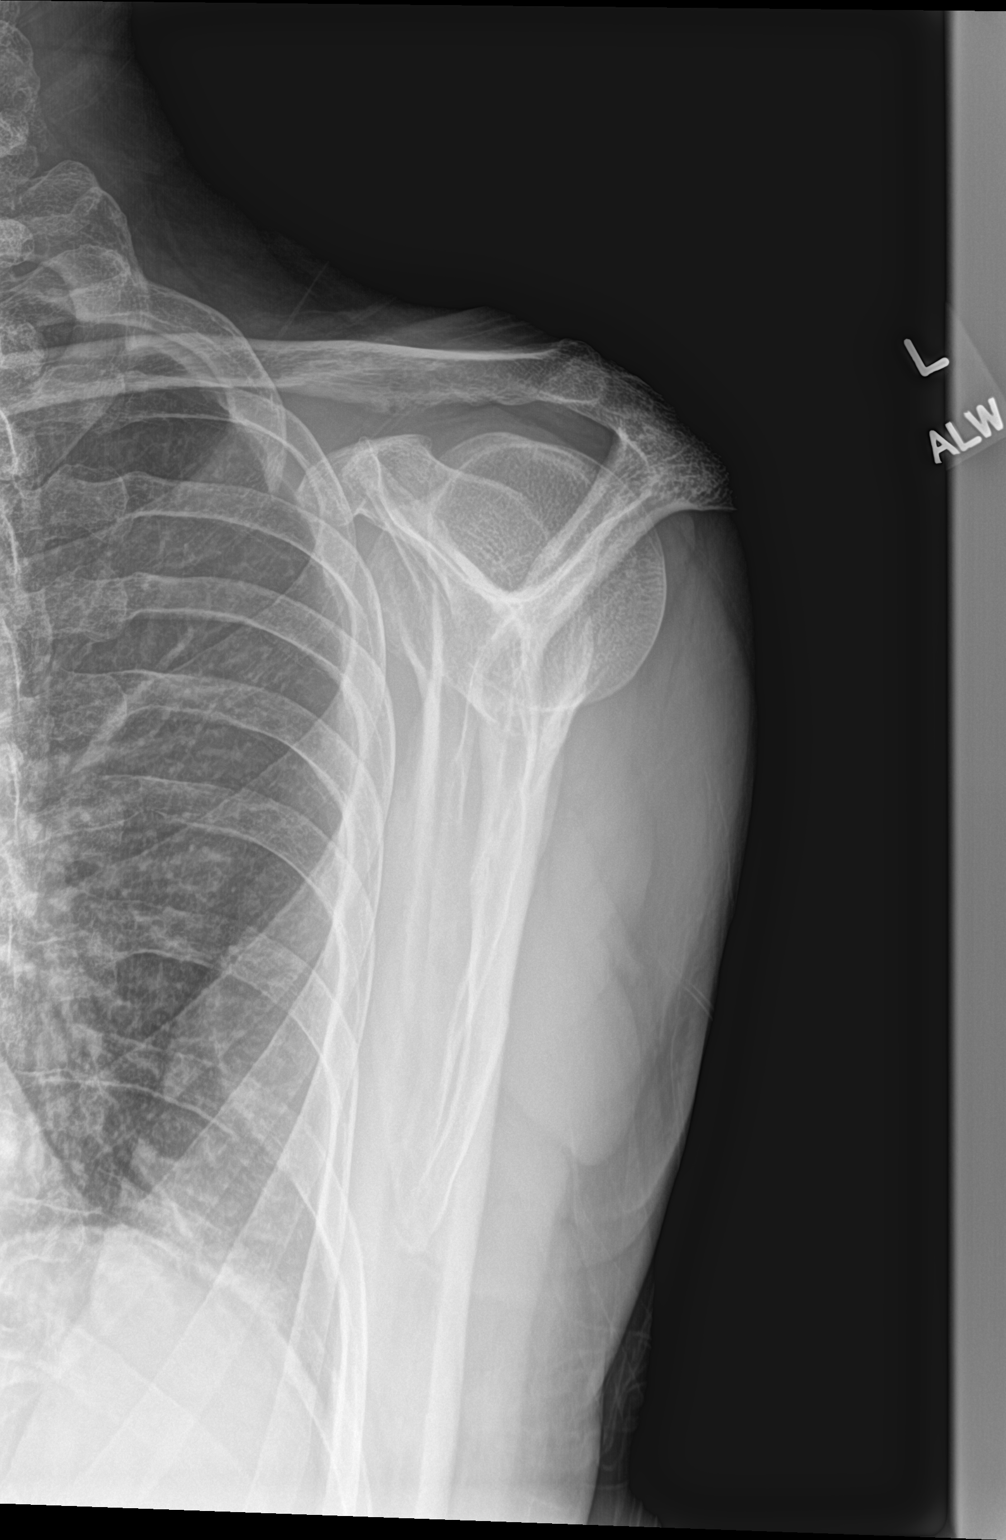

[shoulder ap]
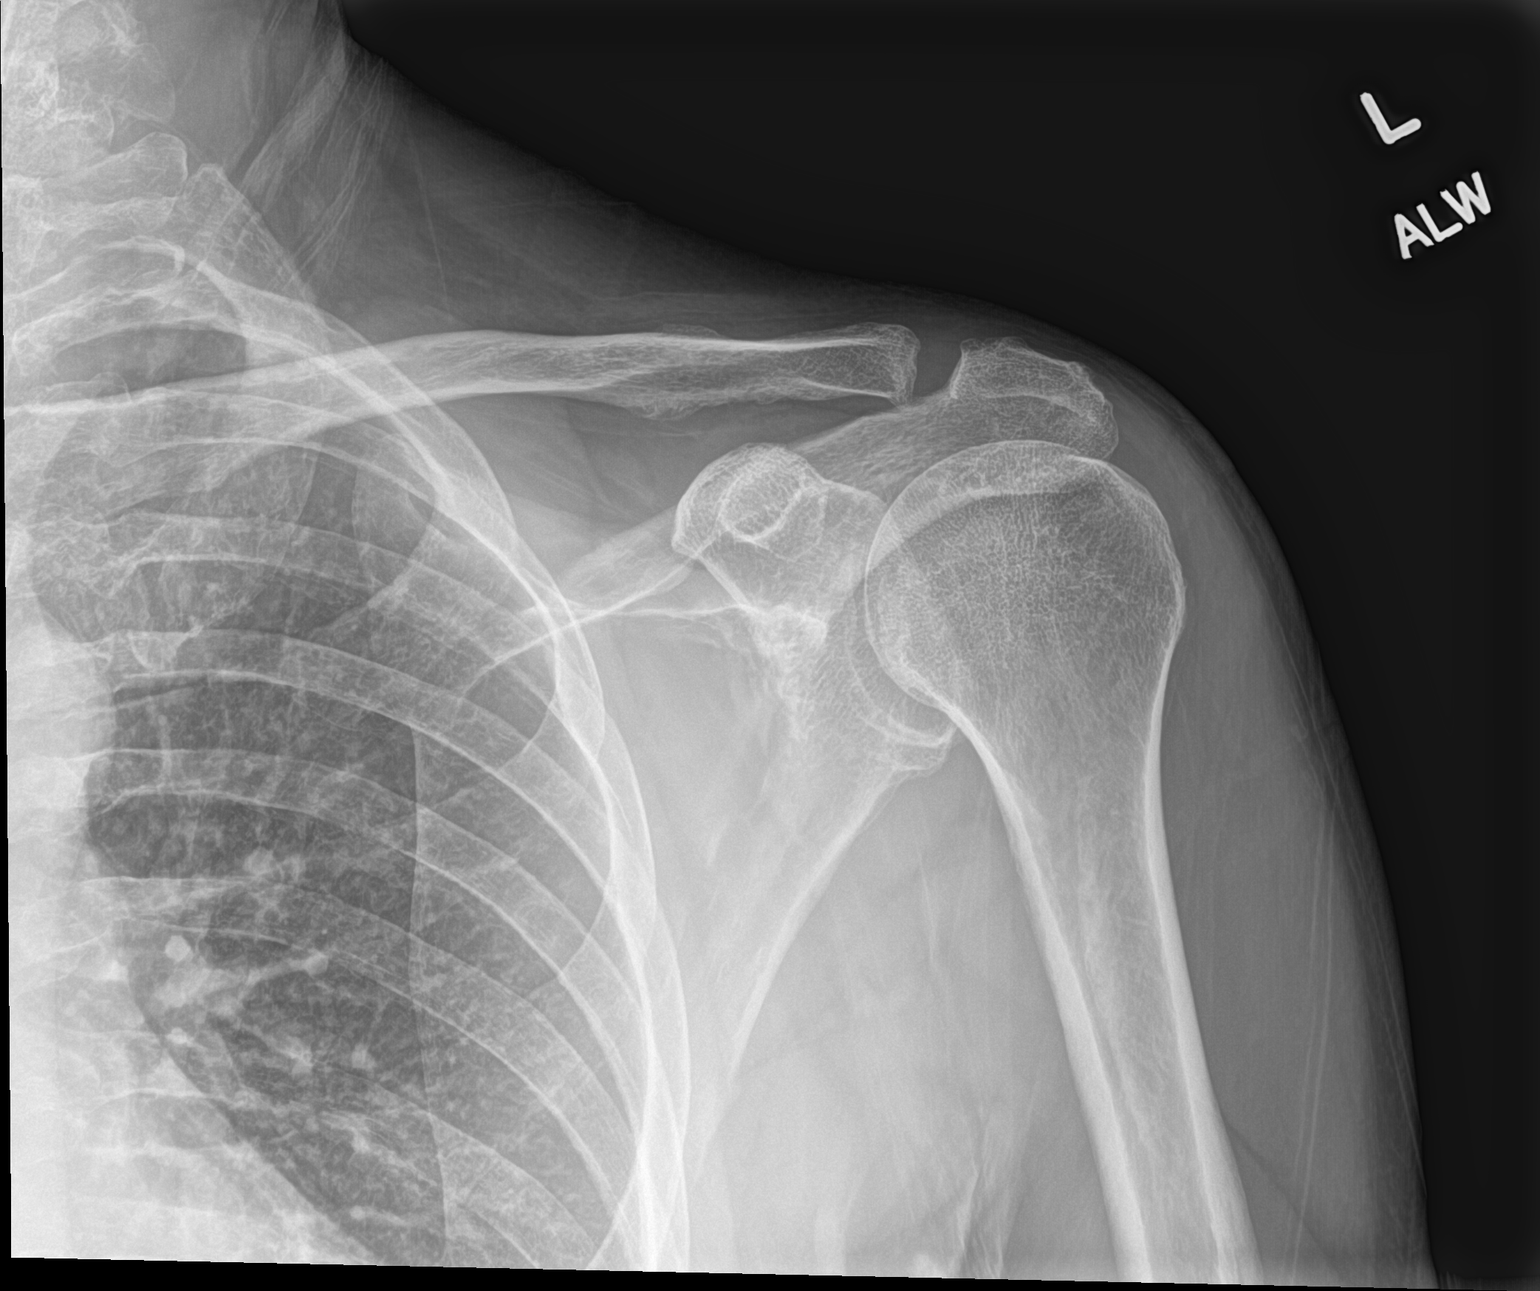

[3 of 3 positions shown; findings below may reference images not displayed]

FINDINGS: There is no evidence of fracture or dislocation. There is no
evidence of arthropathy or other focal bone abnormality. Soft
tissues are unremarkable.
IMPRESSION: Negative.

## 2021-11-06 IMAGING — CT CT HEAD W/O CM
4 series · 16 of 47 positions shown, 18 images · non-contrast
Comparison: Head MRI [DATE].

CLINICAL DATA: Fall at home with head injury.  On blood thinners.



[Series 2: head wo · axial · 0.45mm/px · z∈[+798,+918]mm · 7 of 33 slices shown, 9 images]
[im 5/33  brain]
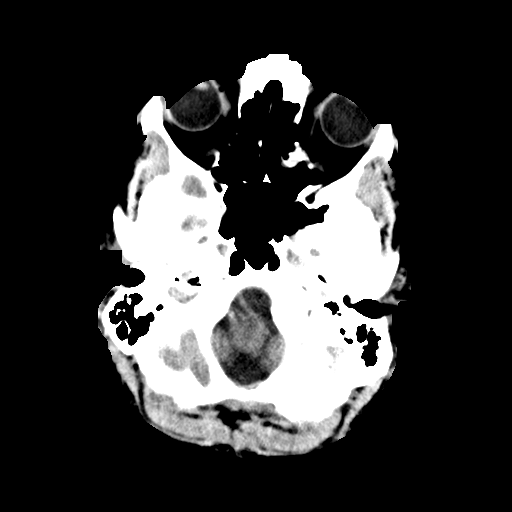
[im 5/33  bone]
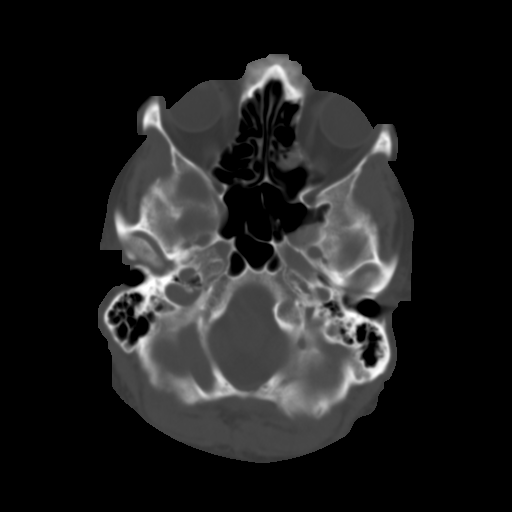
[im 9/33  brain]
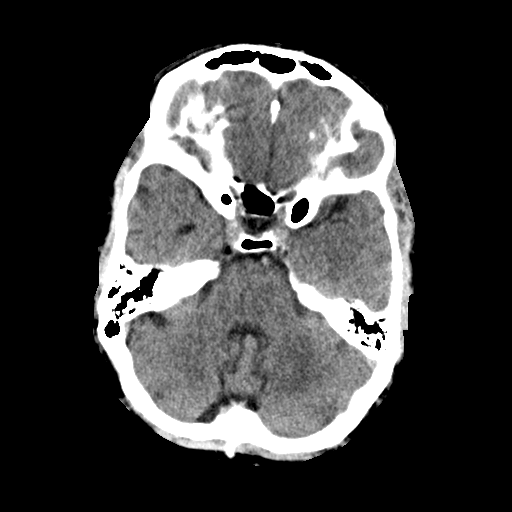
[im 13/33  brain]
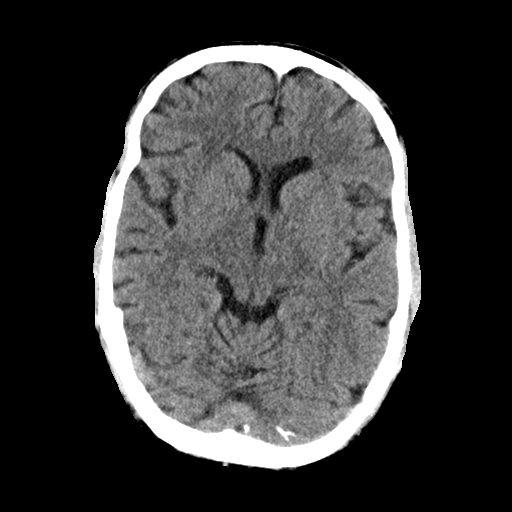
[im 17/33  brain]
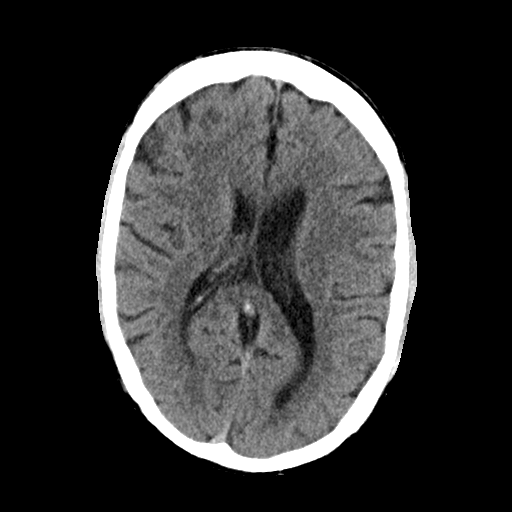
[im 21/33  brain]
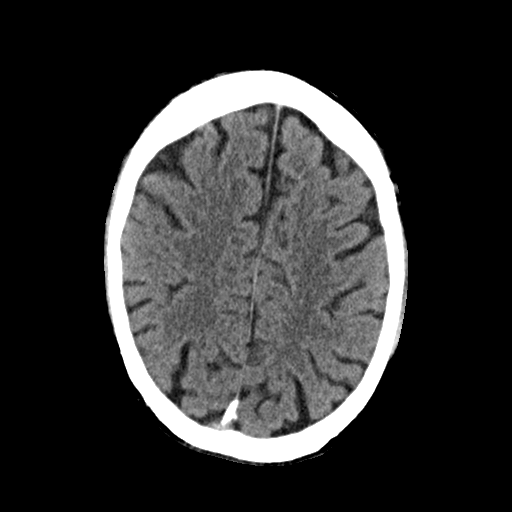
[im 21/33  bone]
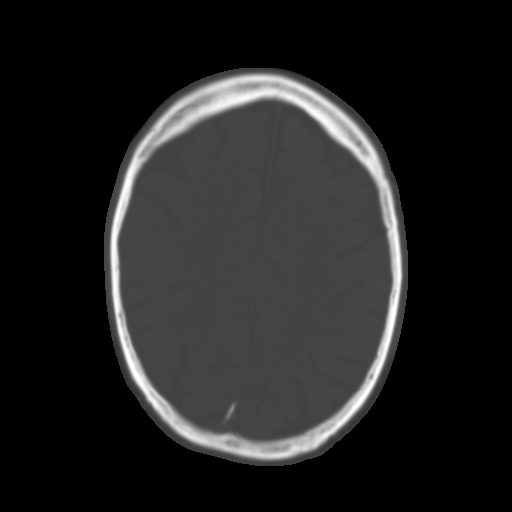
[im 25/33  brain]
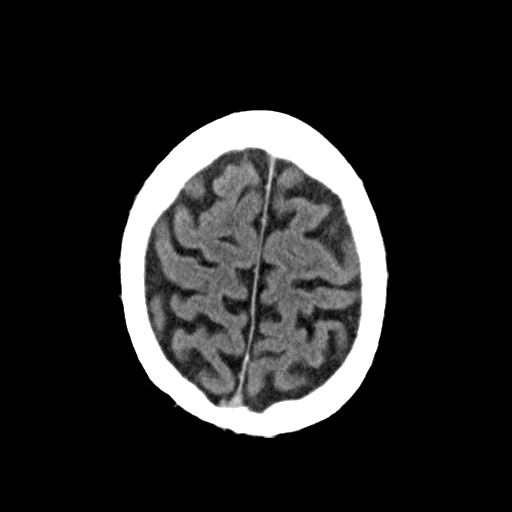
[im 29/33  brain]
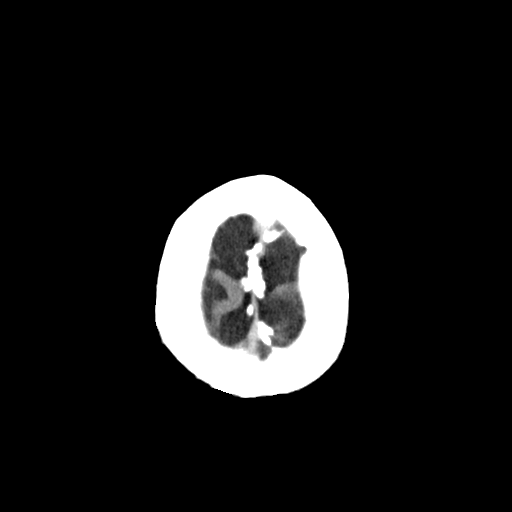

[Series 3: head bone · axial · 0.45mm/px · z∈[+794,+826]mm · 3 of 81 slices shown]
[im 9/81  bone]
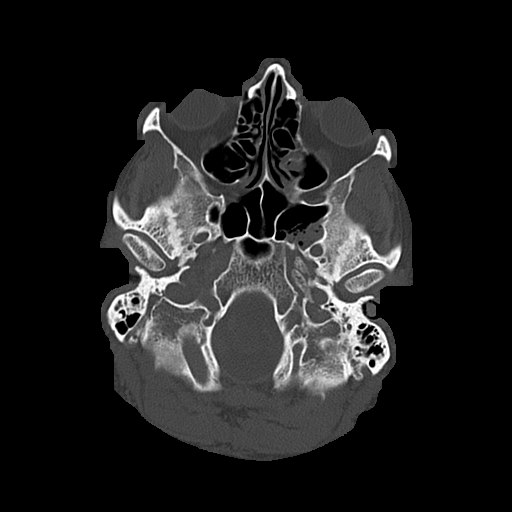
[im 17/81  bone]
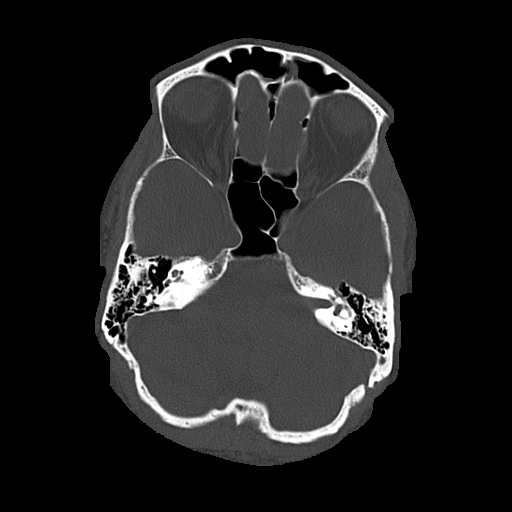
[im 25/81  bone]
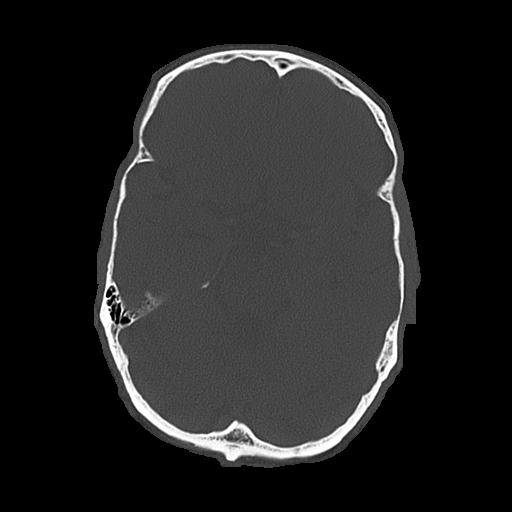

[Series 4: coronal soft · coronal · 0.35mm/px · 3 of 71 slices shown]
[im 24/71  brain]
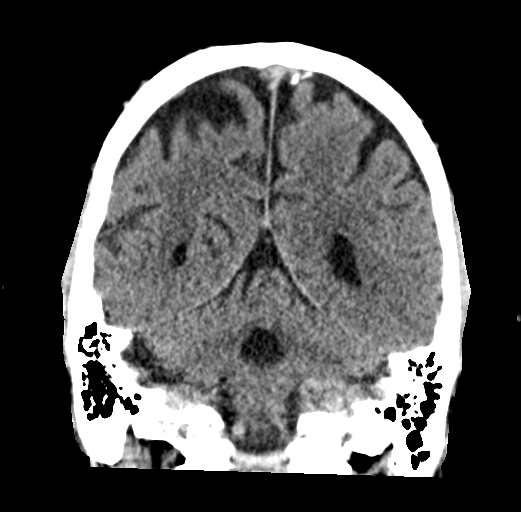
[im 32/71  brain]
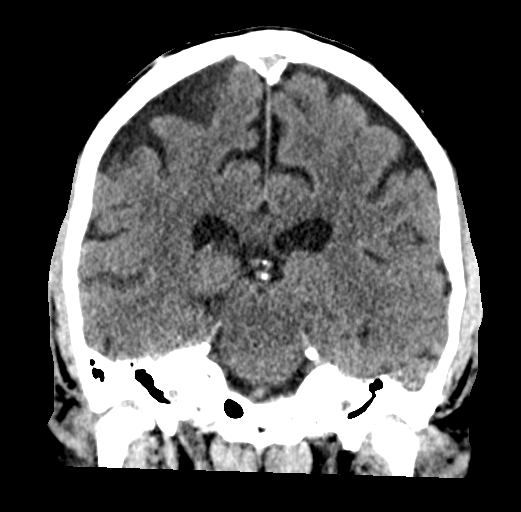
[im 39/71  brain]
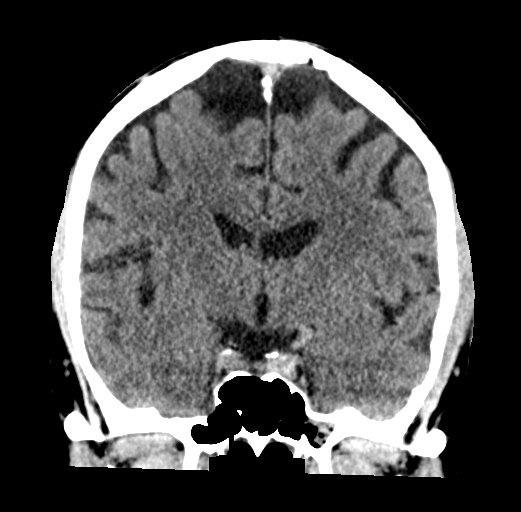

[Series 5: sagittal soft · sagittal · 0.35mm/px · 3 of 61 slices shown]
[im 21/61  brain]
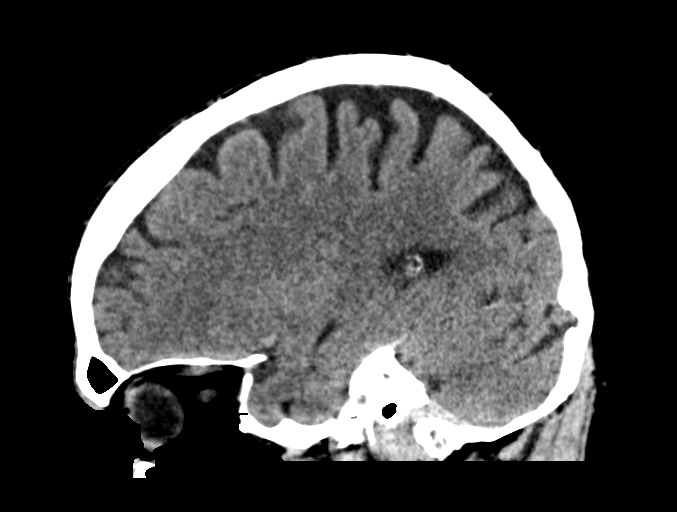
[im 31/61  brain]
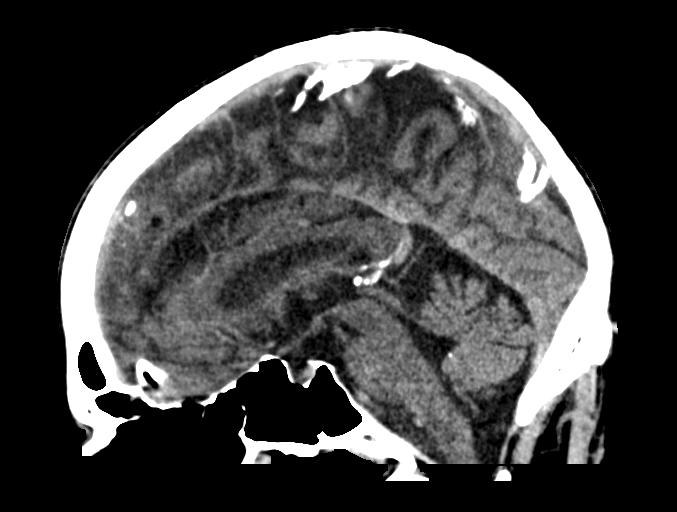
[im 41/61  brain]
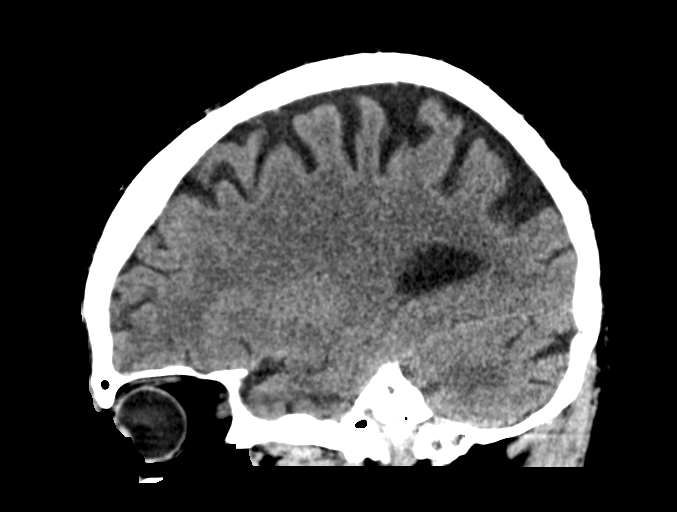

[16 of 47 positions shown; findings below may reference images not displayed]

FINDINGS: Brain: There is no evidence of acute intracranial hemorrhage, mass
lesion, brain edema or extra-axial fluid collection. The ventricles
and subarachnoid spaces are appropriately sized for age. There is no
CT evidence of acute cortical infarction. Mild low-density in the
periventricular white matter, similar to previous MRI and likely due
to chronic small vessel ischemic changes.

Vascular:  No hyperdense vessel identified.

Skull: Negative for fracture or focal lesion.

Sinuses/Orbits: Scattered mucosal thickening in the frontal, left
ethmoid and left sphenoid sinuses without air-levels. No visible
facial fractures. The mastoid air cells and middle ears are clear.
No significant orbital findings.

Other: None.
IMPRESSION: 1. No acute intracranial or calvarial findings.
2. Paranasal sinus inflammatory changes without air-fluid levels.

## 2021-11-06 NOTE — Discharge Instructions (Signed)
Use Tylenol as needed for pain.

## 2021-11-06 NOTE — Progress Notes (Signed)
? ?  Follow-Up Visit ?  ?Subjective  ?Nicholas B Geralds Brooke Bonito. is a 66 y.o. male who presents for the following: Annual Exam (Facial rash on the forehead and nose lesion x months scale to touch. Personal history of bcc and atypical moles. Patient wants a refill of tacrolimus for facial redness ). ? ?Annual skin check, new spot left nostril (pointed out to me by patient), rash on face ?Location:  ?Duration:  ?Quality:  ?Associated Signs/Symptoms: ?Modifying Factors:  ?Severity:  ?Timing: ?Context:  ? ?Objective  ?Well appearing patient in no apparent distress; mood and affect are within normal limits. ?Left Nasal Sidewall ?Pearly 4 mm pink papule, BCC ? ? ? ? ?Left Upper Arm - Anterior ?6 mm tan flattopped textured papule, compatible dermoscopy ? ?Mid Parietal Scalp ?Gritty 5 mm pink crust ? ?Left Malar Cheek, Mid Forehead, Right Malar Cheek ?Minimal inflammation today but history of central facial erythema, dryness.  Historically tacrolimus ointment is helpful. ? ? ? ?All skin waist up examined. ? ? ?Assessment & Plan  ? ? ?Neoplasm of uncertain behavior of skin ?Left Nasal Sidewall ? ?Skin / nail biopsy ?Type of biopsy: tangential   ?Informed consent: discussed and consent obtained   ?Timeout: patient name, date of birth, surgical site, and procedure verified   ?Procedure prep:  Patient was prepped and draped in usual sterile fashion (Non sterile) ?Prep type:  Chlorhexidine ?Anesthesia: the lesion was anesthetized in a standard fashion   ?Anesthetic:  1% lidocaine w/ epinephrine 1-100,000 local infiltration ?Instrument used: flexible razor blade   ?Outcome: patient tolerated procedure well   ?Post-procedure details: wound care instructions given   ? ?Specimen 1 - Surgical pathology ?Differential Diagnosis: bcc vs scc ? ?Check Margins: No ? ?Biopsy obtained, we will schedule surgery and discuss the option of Mohs excision at that time. ? ?Seborrheic keratosis ?Left Upper Arm - Anterior ? ?Leave if stable ? ?AK (actinic  keratosis) ?Mid Parietal Scalp ? ?Destruction of lesion - Mid Parietal Scalp ?Complexity: simple   ?Destruction method: cryotherapy   ?Informed consent: discussed and consent obtained   ?Timeout:  patient name, date of birth, surgical site, and procedure verified ?Lesion destroyed using liquid nitrogen: Yes   ?Cryotherapy cycles:  3 ?Outcome: patient tolerated procedure well with no complications   ? ?Seborrheic dermatitis ?Mid Forehead; Left Malar Cheek; Right Malar Cheek ? ?Okay refills on generic Protopic.  Contact me if this is not helping. ? ?tacrolimus (PROTOPIC) 0.1 % ointment - Left Malar Cheek, Mid Forehead, Right Malar Cheek ?Apply 1 application. topically daily. ? ? ? ? ? ?I, Lavonna Monarch, MD, have reviewed all documentation for this visit.  The documentation on 11/06/21 for the exam, diagnosis, procedures, and orders are all accurate and complete. ?

## 2021-11-06 NOTE — ED Triage Notes (Signed)
Patient here POV from Home for Fall. ? ?Patient fell approximately 3 steps and fell on concrete Floor in [facility]. ? ?Endorses Pain to Left Shoulder and Anterior Head. Patient takes Eliquis for Atrial Fib.  ? ?No LOC. NAD Noted during Triage. A&Ox4. GCS 15. BIB Wheelchair.  ?

## 2021-11-06 NOTE — ED Provider Notes (Signed)
?[facility] [facility] ?Provider Note ? ? ?CSN: [id number] ?Arrival date & time: 11/06/21  1936 ? ?  ? ?History ? ?Chief Complaint  ?Patient presents with  ? Fall  ? ? ?Nicholas West. is a 66 y.o. male. ? ?66 year old male presents with mechanical fall just prior to arrival.  He is on Eliquis and did strike his head but denies any loss of consciousness.  He did fall onto his left shoulder.  Complains of sharp pain to that shoulder that is worse with movement.  Denies any distal numbness or tingling to his left hand.  No neck pain no chest or abdominal discomfort.  No confusion or emesis or neurological deficits. ? ? ?  ? ?Home Medications ?Prior to Admission medications   ?Medication Sig Start Date End Date Taking? Authorizing Provider  ?apixaban (ELIQUIS) 5 MG TABS tablet Take 1 tablet (5 mg total) by mouth 2 (two) times daily. 11/03/21   [place], Peter M, MD  ?B-D 3CC LUER-LOK SYR 23GX1" 23G X 1" 3 ML MISC SMARTSIG:1 Injection Once a Week 02/06/20   [provider]  ?B-D HYPODERMIC NEEDLE 18GX1.5" 18G X 1-1/2" MISC USE TO DRAW UP MEDICATION 04/01/20   [provider]  ?calcium carbonate (OS-CAL - DOSED IN MG OF ELEMENTAL CALCIUM) 1250 MG tablet Take 2 tablets by mouth 2 (two) times daily with a meal.     [provider]  ?cholecalciferol (VITAMIN D) 1000 UNITS tablet Take 1,000 Units by mouth 2 (two) times daily.    [provider]  ?diltiazem (CARDIZEM CD) 240 MG 24 hr capsule TAKE 1 CAPSULE(240 MG) BY MOUTH DAILY 08/08/21   [place], Peter M, MD  ?Docusate Sodium (COLACE PO) Take 1 tablet by mouth as directed.    [provider]  ?Multiple Vitamin (MULTIVITAMIN) tablet Take 1 tablet by mouth daily.    [provider]  ?Omega-3 Fatty Acids (OMEGA 3 PO) Take 2 capsules by mouth daily.     [provider]  ?omeprazole (PRILOSEC) 20 MG capsule Take 1 capsule (20 mg total) by mouth daily. 03/05/12   [name], MD  ?Probiotic Product  (PROBIOTIC-10 PO) Take 1 tablet by mouth daily.    [provider]  ?rosuvastatin (CRESTOR) 10 MG tablet Take 1 tablet (10 mg total) by mouth daily. 11/03/21 02/01/22  [place], Peter M, MD  ?sildenafil (VIAGRA) 100 MG tablet Take 100 mg by mouth as needed. 08/30/21   [provider]  ?tacrolimus (PROTOPIC) 0.1 % ointment Apply 1 application. topically daily. 10/19/21   Lavonna Monarch, MD  ?testosterone cypionate (DEPOTESTOSTERONE CYPIONATE) 200 MG/ML injection Inject into the muscle once a week.     [provider]  ?   ? ?Allergies    ?Penicillins   ? ?Review of Systems   ?Review of Systems  ?All other systems reviewed and are negative. ? ?Physical Exam ?Updated Vital Signs ?BP (!) 153/90 (BP Location: Right Arm)   Pulse 62   Temp (!) 97.5 ?F (36.4 ?C) (Temporal)   Resp 18   Ht 1.854 m ('6\' 1"'$ )   Wt 83.3 kg   SpO2 100%   BMI 24.23 kg/m?  ?Physical Exam ?Vitals and nursing note reviewed.  ?Constitutional:   ?   General: He is not in acute distress. ?   Appearance: Normal appearance. He is well-developed. He is not toxic-appearing.  ?HENT:  ?   Head: Normocephalic and atraumatic.  ?Eyes:  ?   General: Lids are normal.  ?  Conjunctiva/sclera: Conjunctivae normal.  ?   Pupils: Pupils are equal, round, and reactive to light.  ?Neck:  ?   Thyroid: No thyroid mass.  ?   Trachea: No tracheal deviation.  ?Cardiovascular:  ?   Rate and Rhythm: Normal rate and regular rhythm.  ?   Heart sounds: Normal heart sounds. No murmur heard. ?  No gallop.  ?Pulmonary:  ?   Effort: Pulmonary effort is normal. No respiratory distress.  ?   Breath sounds: Normal breath sounds. No stridor. No decreased breath sounds, wheezing, rhonchi or rales.  ?Abdominal:  ?   General: There is no distension.  ?   Palpations: Abdomen is soft.  ?   Tenderness: There is no abdominal tenderness. There is no rebound.  ?Musculoskeletal:  ?   Left shoulder: Tenderness present. Decreased range of motion.  ?   Cervical back: Normal  range of motion and neck supple.  ?   Comments: Left shoulder without evidence of dislocation.  He is able to move his arm through range of motion but with some discomfort.  ?Skin: ?   General: Skin is warm and dry.  ?   Findings: No abrasion or rash.  ?Neurological:  ?   General: No focal deficit present.  ?   Mental Status: He is alert and oriented to person, place, and time. Mental status is at baseline.  ?   GCS: GCS eye subscore is 4. GCS verbal subscore is 5. GCS motor subscore is 6.  ?   Cranial Nerves: No cranial nerve deficit.  ?   Sensory: No sensory deficit.  ?   Motor: Motor function is intact.  ?Psychiatric:     ?   Attention and Perception: Attention normal.     ?   Speech: Speech normal.     ?   Behavior: Behavior normal.  ? ? ?ED Results / Procedures / Treatments   ?Labs ?(all labs ordered are listed, but only abnormal results are displayed) ?Labs Reviewed - No data to display ? ?EKG ?None ? ?Radiology ?CT Head Wo Contrast ? ?Result Date: 11/06/2021 ?CLINICAL DATA:  Fall at home with head injury.  On blood thinners. EXAM: CT HEAD WITHOUT CONTRAST TECHNIQUE: Contiguous axial images were obtained from the base of the skull through the vertex without intravenous contrast. RADIATION DOSE REDUCTION: This exam was performed according to the departmental dose-optimization program which includes automated exposure control, adjustment of the mA and/or kV according to patient size and/or use of iterative reconstruction technique. COMPARISON:  Head MRI 10/10/2021. FINDINGS: Brain: There is no evidence of acute intracranial hemorrhage, mass lesion, brain edema or extra-axial fluid collection. The ventricles and subarachnoid spaces are appropriately sized for age. There is no CT evidence of acute cortical infarction. Mild low-density in the periventricular white matter, similar to previous MRI and likely due to chronic small vessel ischemic changes. Vascular:  No hyperdense vessel identified. Skull: Negative for  fracture or focal lesion. Sinuses/Orbits: Scattered mucosal thickening in the frontal, left ethmoid and left sphenoid sinuses without air-levels. No visible facial fractures. The mastoid air cells and middle ears are clear. No significant orbital findings. Other: None. IMPRESSION: 1. No acute intracranial or calvarial findings. 2. Paranasal sinus inflammatory changes without air-fluid levels. Electronically Signed   By: Richardean Sale M.D.   On: 11/06/2021 20:27  ? ?DG Shoulder Left ? ?Result Date: 11/06/2021 ?CLINICAL DATA:  Shoulder Pain (L) from Fall. EXAM: LEFT SHOULDER - 2+ VIEW COMPARISON:  None. FINDINGS: There is  no evidence of fracture or dislocation. There is no evidence of arthropathy or other focal bone abnormality. Soft tissues are unremarkable. IMPRESSION: Negative. Electronically Signed   By: Iven Finn M.D.   On: 11/06/2021 20:24   ? ?Procedures ?Procedures  ? ? ?Medications Ordered in ED ?Medications - No data to display ? ?ED Course/ Medical Decision Making/ A&P ?  ?                        ?Medical Decision Making ?Amount and/or Complexity of Data Reviewed ?Radiology: ordered. ? ? ?Patient had mechanical fall just prior to arrival.  He is on Eliquis concern for intracranial hemorrhage.  He subsequently had a head CT which per my review and interpretation showed no intracranial hemorrhage.  Some paranasal sinus inflammatory changes but without air-fluid levels.  Patient has had no sinus symptoms.  Left shoulder x-ray per my review and interpretation did not show any evidence of fracture.  Given a sling for comfort.  Will be given referral to orthopedics on-call.  Plan discussed with wife who is in attendance and agrees with this ? ? ? ? ? ? ? ?Final Clinical Impression(s) / ED Diagnoses ?Final diagnoses:  ?None  ? ? ?Rx / DC Orders ?ED Discharge Orders   ? ? None  ? ?  ? ? ?  ?Lacretia Leigh, MD ?11/06/21 2209 ? ?

## 2021-11-09 DIAGNOSIS — M25512 Pain in left shoulder: Secondary | ICD-10-CM | POA: Diagnosis not present

## 2021-11-16 DIAGNOSIS — H903 Sensorineural hearing loss, bilateral: Secondary | ICD-10-CM | POA: Diagnosis not present

## 2021-11-25 ENCOUNTER — Encounter: Payer: Self-pay | Admitting: Family Medicine

## 2021-11-25 ENCOUNTER — Ambulatory Visit (HOSPITAL_BASED_OUTPATIENT_CLINIC_OR_DEPARTMENT_OTHER)
Admission: RE | Admit: 2021-11-25 | Discharge: 2021-11-25 | Disposition: A | Discharge status: Home / Self Care | Payer: Medicare Other | Source: Ambulatory Visit | Attending: Family Medicine | Admitting: Family Medicine

## 2021-11-25 ENCOUNTER — Ambulatory Visit (INDEPENDENT_AMBULATORY_CARE_PROVIDER_SITE_OTHER): Payer: Medicare Other | Admitting: Family Medicine

## 2021-11-25 VITALS — BP 116/68 | HR 71 | Temp 97.8°F | Resp 16 | Ht 73.0 in | Wt 178.0 lb

## 2021-11-25 DIAGNOSIS — R519 Headache, unspecified: Secondary | ICD-10-CM

## 2021-11-25 DIAGNOSIS — Z7901 Long term (current) use of anticoagulants: Secondary | ICD-10-CM

## 2021-11-25 DIAGNOSIS — M25511 Pain in right shoulder: Secondary | ICD-10-CM | POA: Diagnosis not present

## 2021-11-25 DIAGNOSIS — R6883 Chills (without fever): Secondary | ICD-10-CM | POA: Diagnosis not present

## 2021-11-25 DIAGNOSIS — R5383 Other fatigue: Secondary | ICD-10-CM

## 2021-11-25 DIAGNOSIS — M791 Myalgia, unspecified site: Secondary | ICD-10-CM

## 2021-11-25 DIAGNOSIS — I4891 Unspecified atrial fibrillation: Secondary | ICD-10-CM | POA: Diagnosis not present

## 2021-11-25 DIAGNOSIS — Z8673 Personal history of transient ischemic attack (TIA), and cerebral infarction without residual deficits: Secondary | ICD-10-CM | POA: Insufficient documentation

## 2021-11-25 LAB — COMPREHENSIVE METABOLIC PANEL
ALT: 27 U/L (ref 0–53)
AST: 27 U/L (ref 0–37)
Albumin: 4.5 g/dL (ref 3.5–5.2)
Alkaline Phosphatase: 59 U/L (ref 39–117)
BUN: 11 mg/dL (ref 6–23)
CO2: 30 mEq/L (ref 19–32)
Calcium: 9.2 mg/dL (ref 8.4–10.5)
Chloride: 98 mEq/L (ref 96–112)
Creatinine, Ser: 0.92 mg/dL (ref 0.40–1.50)
GFR: 86.16 mL/min (ref >60.00)
Glucose, Bld: 73 mg/dL (ref 70–99)
Potassium: 3.7 mEq/L (ref 3.5–5.1)
Sodium: 136 mEq/L (ref 135–145)
Total Bilirubin: 1.2 mg/dL (ref 0.2–1.2)
Total Protein: 7 g/dL (ref 6.0–8.3)

## 2021-11-25 LAB — CBC
HCT: 48 % (ref 39.0–52.0)
Hemoglobin: 16.3 g/dL (ref 13.0–17.0)
MCHC: 34 g/dL (ref 30.0–36.0)
MCV: 89.2 fl (ref 78.0–100.0)
Platelets: 165 10*3/uL (ref 150.0–400.0)
RBC: 5.38 Mil/uL (ref 4.22–5.81)
RDW: 12.9 % (ref 11.5–15.5)
WBC: 8.9 10*3/uL (ref 4.0–10.5)

## 2021-11-25 LAB — POC COVID19 BINAXNOW: SARS Coronavirus 2 Ag: NEGATIVE

## 2021-11-25 IMAGING — CT CT HEAD W/O CM
4 series · 17 of 47 positions shown, 19 images · non-contrast
Comparison: [DATE]

CLINICAL DATA: Headache.



[Series 2: head wo · axial · 0.43mm/px · z∈[+1119,+1239]mm · 7 of 32 slices shown, 9 images]
[im 4/32  brain]
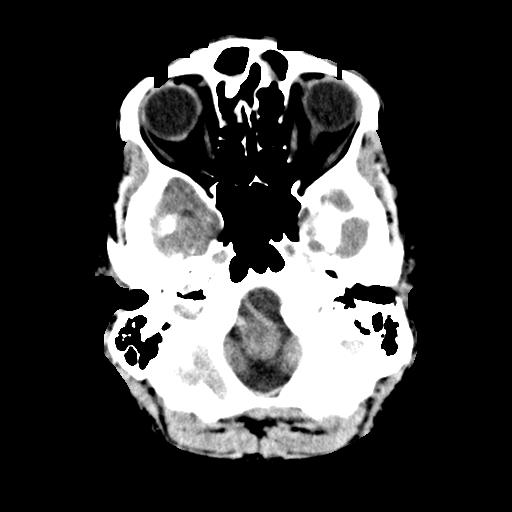
[im 4/32  bone]
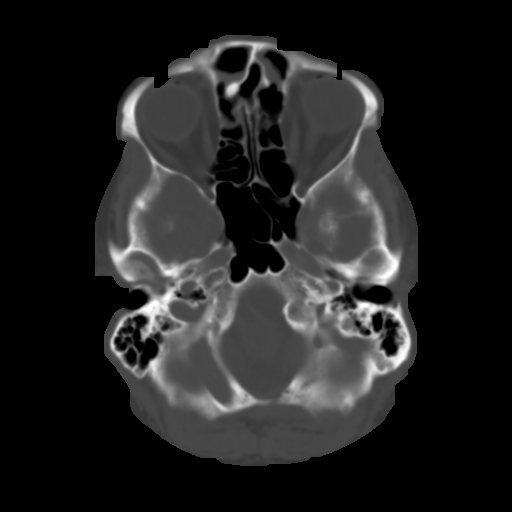
[im 8/32  brain]
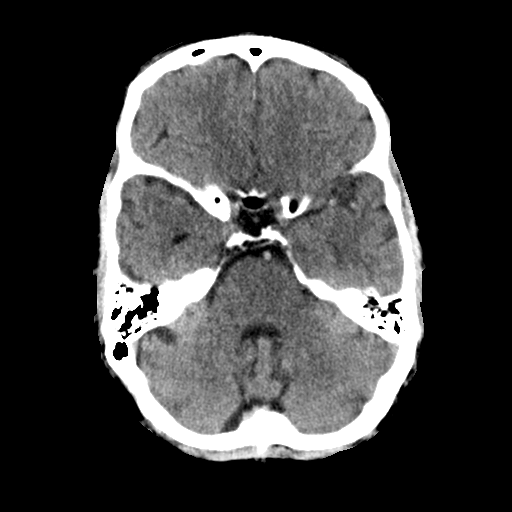
[im 12/32  brain]
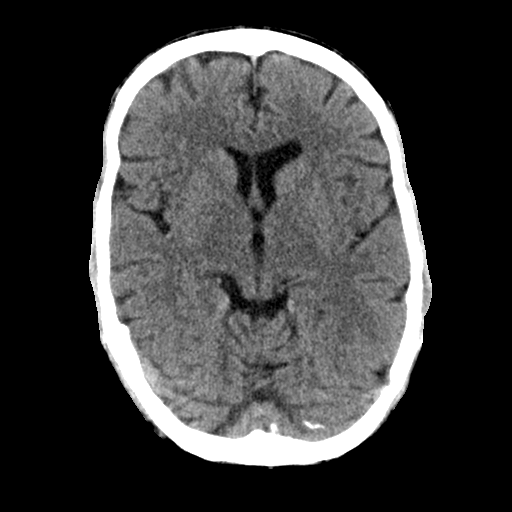
[im 16/32  brain]
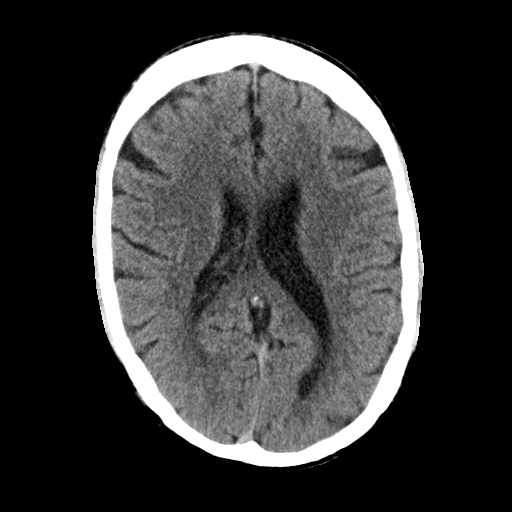
[im 20/32  brain]
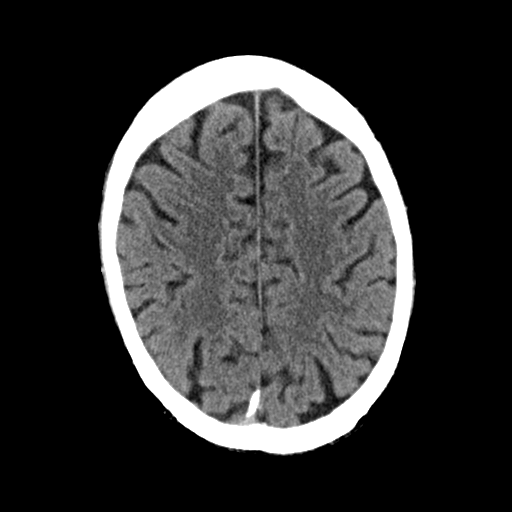
[im 20/32  bone]
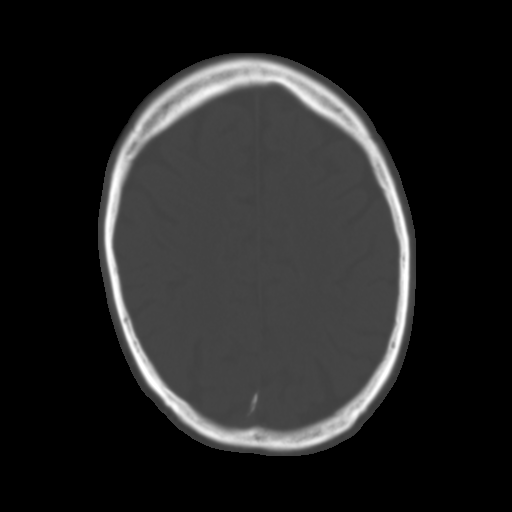
[im 24/32  brain]
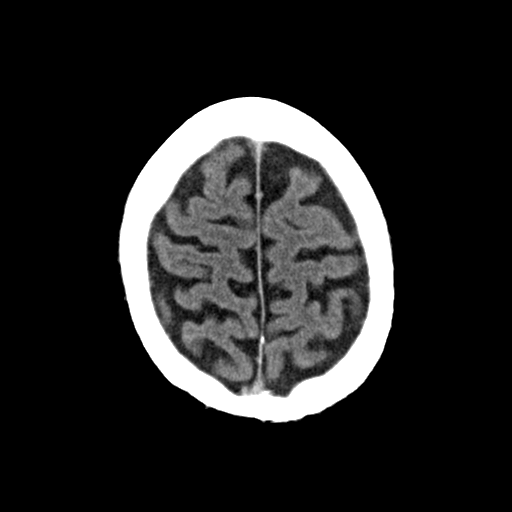
[im 28/32  brain]
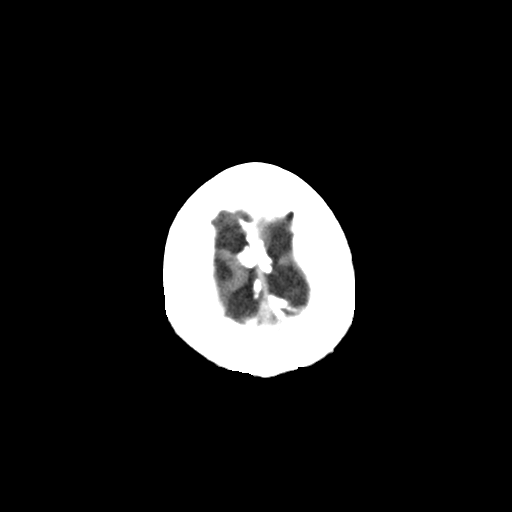

[Series 3: head bone · axial · 0.43mm/px · z∈[+1118,+1174]mm · 4 of 79 slices shown]
[im 8/79  bone]
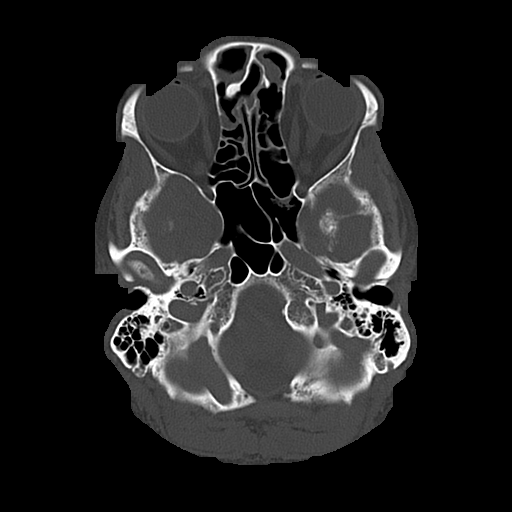
[im 16/79  bone]
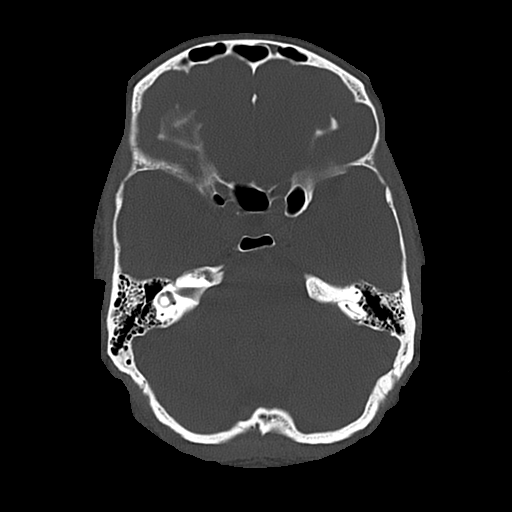
[im 24/79  bone]
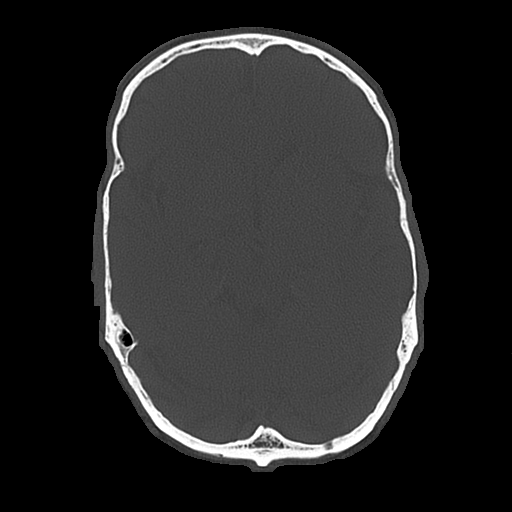
[im 36/79  bone]
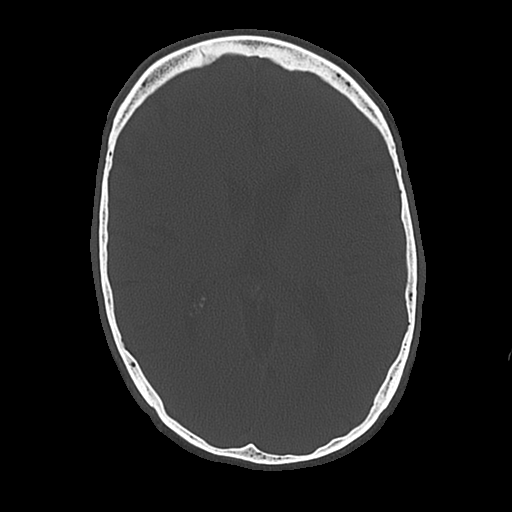

[Series 4: coronal soft · coronal · 0.36mm/px · 3 of 68 slices shown]
[im 23/68  brain]
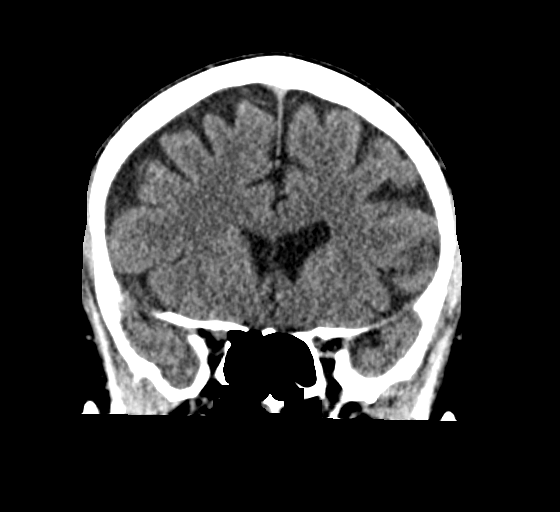
[im 30/68  brain]
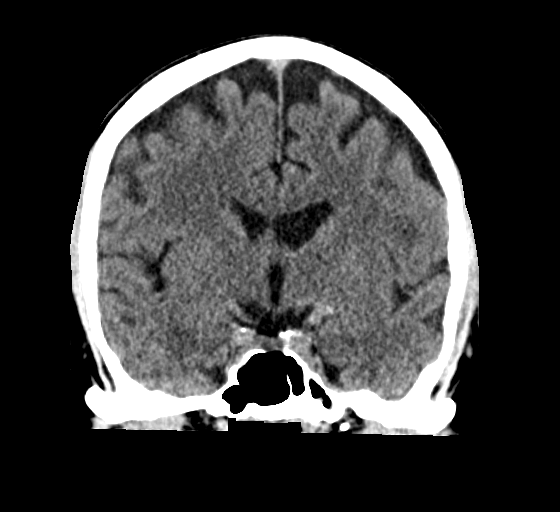
[im 38/68  brain]
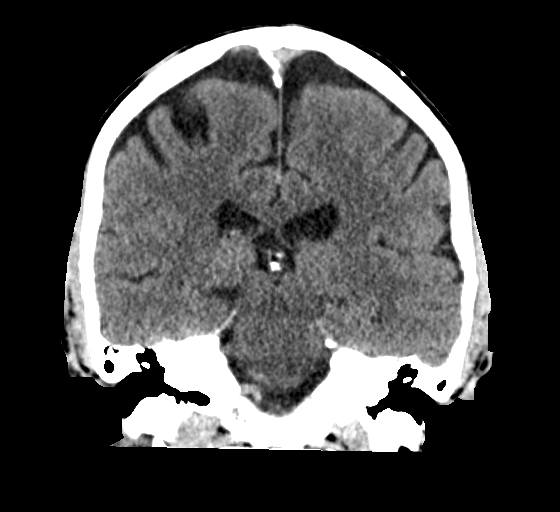

[Series 5: sagittal soft · sagittal · 0.36mm/px · 3 of 57 slices shown]
[im 19/57  brain]
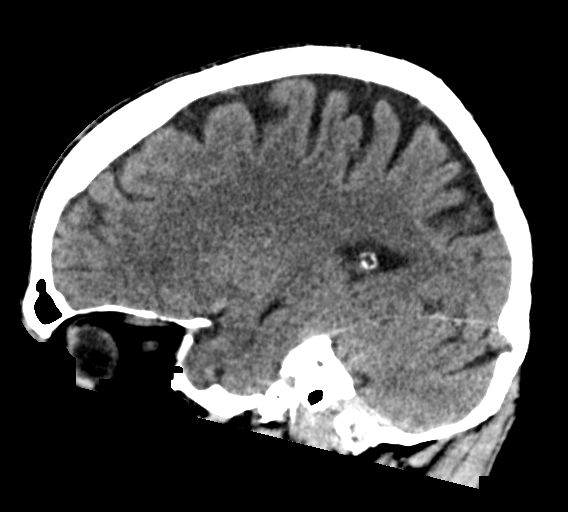
[im 29/57  brain]
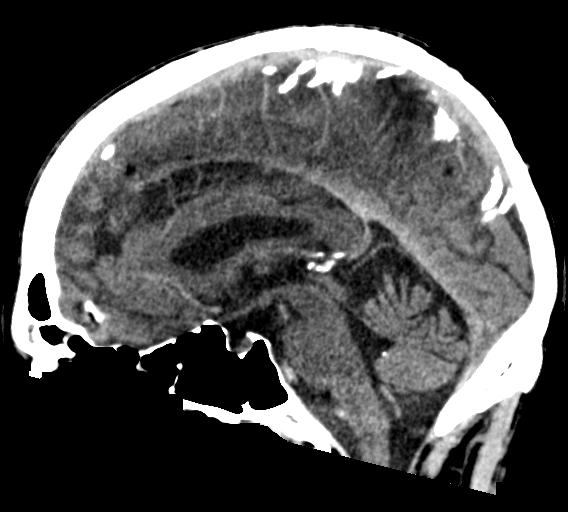
[im 38/57  brain]
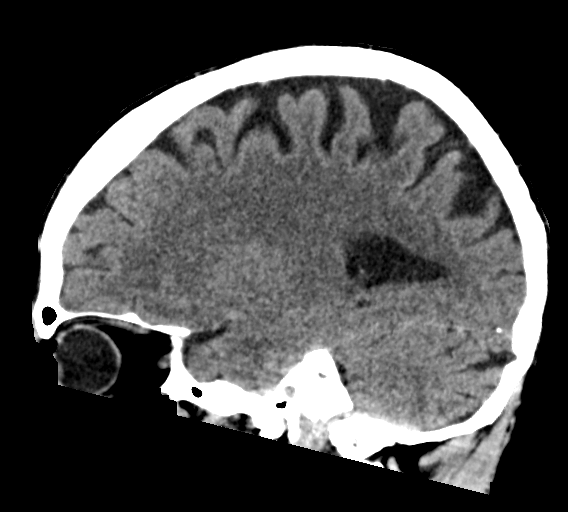

[17 of 47 positions shown; findings below may reference images not displayed]

FINDINGS: Brain: There is no evidence for acute hemorrhage, hydrocephalus,
mass lesion, or abnormal extra-axial fluid collection. No definite
CT evidence for acute infarction. Diffuse loss of parenchymal volume
is consistent with atrophy.

Vascular: No hyperdense vessel or unexpected calcification.

Skull: No evidence for fracture. No worrisome lytic or sclerotic
lesion.

Sinuses/Orbits: Similar appearance of chronic mucosal disease left
sphenoid sinus with scattered areas of mucosal thickening in the
frontal and ethmoid sinuses. Visualized portions of the globes and
intraorbital fat are unremarkable.

Other: None.
IMPRESSION: 1. Stable exam.  No acute intracranial abnormality.

## 2021-11-25 NOTE — Progress Notes (Signed)
? ?Subjective:  ?Patient ID: Nicholas Patten., male    DOB: 12-26-1955  Age: 66 y.o. MRN: [id number] ? ?CC:  ?Chief Complaint  ?Patient presents with  ? Muscle Pain  ?  Pt is here for a pulled muscle  ?Pain in right shoulder started on Tuesday morning ?Pt states when he moves his arm it is a sharp pain ?Denies any injuries to the arm   ? ? ?HPI ?Nicholas Patten. presents for  ? ?R shoulder pain/arm pain: ?Woke up with pain 3 days ago - outside of arm. No known injury. More sore with movement yesterday and day before. Sharp pain, radiates to top of shoulder, no  neck pain. No weakness.  ?Had been up in [employer] at rental unit with renovations few days prior. NKI.  ?R hand dominant. Avoiding use d/t pain. Limited ROM. No prior injection/surgery/R shoulder problems.  ?Tx: tylenol, arnacare - mod relief.  ?Pain is a little better today.  ? ?Feeling more weak overall, fatigue all over past few days. No focal weakness, slurred speech or facial droop. No dark stools, CP or heart palpitations. Has had some chills past few days. No fever. No cough. No dysuria. No dyspnea. BM 3-4 times per day, but no diarrhea. Small amount at times.  ?Headaches noted after taking eliquis ?No sick contacts.  ?Had headaches in temples after fall - intermittent, none past few days ?Mechanical fall 4/9. Head injury, left shoulder injury.  X-ray was negative shoulder at that time.  CT head without acute intracranial or calvarial findings. no sign of bleeding.   ? ?He is treated with Eliquis, Cardizem with history of atrial fibrillation.  Last appointment noted from April 6 with cardiology.  Evidence of CVA on MRI, Eliquis was started at that time due to elevated CHA2DS2-VASc score.  Stopped aspirin and Plavix.  Avoiding NSAIDs.  Additionally was started on Crestor 10 mg daily with LDL 97 previously and history of CVA.  Echocardiogram March 28 EF 60%.No wall motion abnormalities.  Grade 1 diastolic dysfunction. ? ?Is also been under the  care of neurology for memory changes.  MRI of the brain was remarkable for area of abnormality in the left occipital pole, suggestive of a subacute cortical infarct without other intracranial abnormalities.  Chronic ischemic disease.  Initially was started on Plavix, aspirin, then changed to Eliquis as above. ?per HPI ? ?History ?Patient Active Problem List  ? Diagnosis Date Noted  ? Hiatal hernia 08/02/2020  ? Laceration of left hand without foreign body 10/11/2016  ? Contusion of right elbow 10/11/2016  ? Paroxysmal atrial fibrillation ([place]) 10/24/2012  ? Hypercholesterolemia 04/03/2012  ? ED (erectile dysfunction) 03/05/2012  ? Hypogonadism male 03/05/2012  ? ?Past Medical History:  ?Diagnosis Date  ? Abdominal pain   ? Atrial fibrillation ([place])   ? BCC (basal cell carcinoma) 09/14/2010  ? ulcerated, top right scalp, Treatment Zyclara  ? BCC (basal cell carcinoma) 02/05/2013  ? Recurrent, top right scalp, MOHs  ? BCC (basal cell carcinoma) 09/05/2017  ? Mico nod, left forehead, MOHs (Dr Link Snuffer)  ? Chest pain   ? negative stress echo in September 2013  ? Diverticular disease   ? Diverticulitis   ? Dysplastic nevus   ? moderate, Left back no treatment  ? GERD (gastroesophageal reflux disease)   ? ?Past Surgical History:  ?Procedure Laterality Date  ? COLON SURGERY    ? PARTIAL COLECTOMY  11/11  ? ?Allergies  ?Allergen Reactions  ? Penicillins   ?  rash  ? ?Prior to Admission medications   ?Medication Sig Start Date End Date Taking? Authorizing Provider  ?apixaban (ELIQUIS) 5 MG TABS tablet Take 1 tablet (5 mg total) by mouth 2 (two) times daily. 11/03/21  Yes Martinique, Peter M, MD  ?B-D 3CC LUER-LOK SYR 23GX1" 23G X 1" 3 ML MISC SMARTSIG:1 Injection Once a Week 02/06/20  Yes [provider]  ?B-D HYPODERMIC NEEDLE 18GX1.5" 18G X 1-1/2" MISC USE TO DRAW UP MEDICATION 04/01/20  Yes [provider]  ?calcium carbonate (OS-CAL - DOSED IN MG OF ELEMENTAL CALCIUM) 1250 MG tablet Take 2 tablets by mouth 2  (two) times daily with a meal.    Yes [provider]  ?cholecalciferol (VITAMIN D) 1000 UNITS tablet Take 1,000 Units by mouth 2 (two) times daily.   Yes [provider]  ?diltiazem (CARDIZEM CD) 240 MG 24 hr capsule TAKE 1 CAPSULE(240 MG) BY MOUTH DAILY 08/08/21  Yes Martinique, Peter M, MD  ?Docusate Sodium (COLACE PO) Take 1 tablet by mouth as directed.   Yes [provider]  ?Multiple Vitamin (MULTIVITAMIN) tablet Take 1 tablet by mouth daily.   Yes [provider]  ?Omega-3 Fatty Acids (OMEGA 3 PO) Take 2 capsules by mouth daily.    Yes [provider]  ?omeprazole (PRILOSEC) 20 MG capsule Take 1 capsule (20 mg total) by mouth daily. 03/05/12  Yes [name], MD  ?Probiotic Product (PROBIOTIC-10 PO) Take 1 tablet by mouth daily.   Yes [provider]  ?rosuvastatin (CRESTOR) 10 MG tablet Take 1 tablet (10 mg total) by mouth daily. 11/03/21 02/01/22 Yes Martinique, Peter M, MD  ?sildenafil (VIAGRA) 100 MG tablet Take 100 mg by mouth as needed. 08/30/21  Yes [provider]  ?tacrolimus (PROTOPIC) 0.1 % ointment Apply 1 application. topically daily. 10/19/21  Yes Lavonna Monarch, MD  ?testosterone cypionate (DEPOTESTOSTERONE CYPIONATE) 200 MG/ML injection Inject into the muscle once a week.    Yes [provider]  ? ?Social History  ? ?Socioeconomic History  ? Marital status: Married  ?  Spouse name: Not on file  ? Number of children: 0  ? Years of education: 28  ? Highest education level: Not on file  ?Occupational History  ? Occupation: attorney  ?  Employer: [employer]  ?Tobacco Use  ? Smoking status: Never  ? Smokeless tobacco: Never  ?Vaping Use  ? Vaping Use: Never used  ?Substance and Sexual Activity  ? Alcohol use: No  ? Drug use: No  ? Sexual activity: Yes  ?Other Topics Concern  ? Not on file  ?Social History Narrative  ? Married  ? Exercise: Yes  ? Education: College  ? Right handed   ? Decaffeine   ? Two story home  ? ?Social Determinants  of Health  ? ?Financial Resource Strain: Not on file  ?Food Insecurity: Not on file  ?Transportation Needs: Not on file  ?Physical Activity: Not on file  ?Stress: Not on file  ?Social Connections: Not on file  ?Intimate Partner Violence: Not on file  ? ? ?Review of Systems ?Per HPI.  ? ?Objective:  ? ?Vitals:  ? 11/25/21 0851  ?BP: 116/68  ?Pulse: 71  ?Resp: 16  ?Temp: 97.8 ?F (36.6 ?C)  ?TempSrc: Temporal  ?SpO2: 97%  ?Weight: 178 lb (80.7 kg)  ?Height: '6\' 1"'$  (1.854 m)  ? ? ? ?Physical Exam ?Vitals reviewed.  ?Constitutional:   ?   General: He is not in acute distress. ?  Appearance: He is well-developed. He is not ill-appearing, toxic-appearing or diaphoretic.  ?HENT:  ?   Head: Normocephalic and atraumatic.  ?Neck:  ?   Vascular: No carotid bruit or JVD.  ?Cardiovascular:  ?   Rate and Rhythm: Normal rate and regular rhythm.  ?   Heart sounds: Normal heart sounds. No murmur heard. ?Pulmonary:  ?   Effort: Pulmonary effort is normal.  ?   Breath sounds: Normal breath sounds. No rales.  ?Musculoskeletal:  ?   Right lower leg: No edema.  ?   Left lower leg: No edema.  ?   Comments: C-spine, minimal decreased range of motion but no pain and no radiation to the shoulder.  No focal bony tenderness. ? ?Right shoulder, tender to palpation over lateral deltoid and the bicipital groove.  No focal bony tenderness.  Guarded range of motion at approximately 90 degrees of flexion, abduction.  Passive range of motion slightly better.  Still somewhat guarded.  Neurovascular intact distally.   ? ?No focal weakness appreciated, no facial droop.  ?Skin: ?   General: Skin is warm and dry.  ?Neurological:  ?   General: No focal deficit present.  ?   Mental Status: He is alert and oriented to person, place, and time.  ?Psychiatric:     ?   Mood and Affect: Mood normal.  ? ?EKG sinus rhythm, RSR in V1.  Rate 68.  QTc 383.  No acute findings.  When compared to EKG July 2022, atrial flutter at that time.  Not seen on current EKG.   ? ?Results for orders placed or performed in visit on 11/25/21  ?CBC  ?Result Value Ref Range  ? WBC 8.9 4.0 - 10.5 K/uL  ? RBC 5.38 4.22 - 5.81 Mil/uL  ? Platelets 165.0 150.0 - 400.0 K/uL  ? Hemoglobin 16.3

## 2021-11-25 NOTE — Patient Instructions (Addendum)
I will check some labs for the fatigue, and chills.  Arm pain is likely overuse of muscle, and should continue to improve with relative rest and gentle range of motion.  Tylenol is fine for now.  If not improving through the weekend then recheck  as possible imaging would be recommended at that time.  Without a fall or specific injury I did not check x-ray today. ? ?With the headaches since your prior injury and fatigue, I do recommend repeat CT of head today.  I am encouraged that you do not have a headache today or yesterday but again think that is necessary given your use of blood thinners. ? ?Rest, drink plenty of fluids, and depending on blood work today can decide next step.  If any worsening symptoms or new symptoms proceed to the emergency room as we discussed.  ? ?Return to the clinic or go to the nearest emergency room if any of your symptoms worsen or new symptoms occur. ? ?

## 2021-11-27 ENCOUNTER — Encounter: Payer: Self-pay | Admitting: Family Medicine

## 2021-12-08 ENCOUNTER — Ambulatory Visit (INDEPENDENT_AMBULATORY_CARE_PROVIDER_SITE_OTHER): Payer: Medicare Other | Admitting: Family Medicine

## 2021-12-08 ENCOUNTER — Encounter: Payer: Self-pay | Admitting: Family Medicine

## 2021-12-08 VITALS — BP 108/62 | HR 52 | Temp 98.1°F | Resp 16 | Ht 73.0 in | Wt 178.2 lb

## 2021-12-08 DIAGNOSIS — M25511 Pain in right shoulder: Secondary | ICD-10-CM

## 2021-12-08 DIAGNOSIS — R5383 Other fatigue: Secondary | ICD-10-CM

## 2021-12-08 DIAGNOSIS — Z7901 Long term (current) use of anticoagulants: Secondary | ICD-10-CM

## 2021-12-08 DIAGNOSIS — Z8673 Personal history of transient ischemic attack (TIA), and cerebral infarction without residual deficits: Secondary | ICD-10-CM

## 2021-12-08 DIAGNOSIS — M791 Myalgia, unspecified site: Secondary | ICD-10-CM

## 2021-12-08 NOTE — Patient Instructions (Signed)
Fasting labs at [facility] in next few weeks.  ?[facility] ?Walk in 8:30-4:30 during weekdays, no appointment needed ?[address]  ?[place], [place] [address] ? ?Glad you are feeling better! ?

## 2021-12-08 NOTE — Progress Notes (Signed)
? ?Subjective:  ?Patient ID: Nicholas Patten., male    DOB: [birth year]  Age: 66 y.o. MRN: [id number] ? ?CC:  ?Chief Complaint  ?Patient presents with  ? Memory Loss  ?  Pt did see Neuro, noted pt had a mild stroke at some point, is now on blood thinners as well as cholesterol meds. Notes he has been adjusting well.   ? Shoulder Pain  ?  Pt notes since most recent visit with shoulder strain he notes he is much improved with no concerns at this time   ? ? ?HPI ?Nicholas Patten. presents for  ? ?Memory loss/changes. ?See last visit April 28.  Subacute cortical infarct noted on MRI of the brain.  Chronic ischemic disease.  Treated with Plavix, aspirin, then changed to Eliquis with history of atrial fibrillation.  Followed by cardiology and neurology.  Off Plavix, aspirin.  Avoiding NSAIDs. ?No new memory changes, no headaches.  Feeling like things are going well. No bleeding.  On crestor '10mg'$  qd - started 11/03/21.  ?Lab Results  ?Component Value Date  ? CHOL 160 05/22/2019  ? HDL 43 05/22/2019  ? [place] 97 05/22/2019  ? TRIG 108 05/22/2019  ? CHOLHDL 3.7 05/22/2019  ? ?Lab Results  ?Component Value Date  ? HGBA1C 5.4 08/04/2021  ? ? ? ? ?Right shoulder/arm pain ?Discussed at April 28 visit.  Possible overuse given activity days prior to that visit.  Symptoms improved on their own, no current pain.  Resolved within a few days of last visit. Previous fatigue, chills, headache at last visit have also improved - lasted few days. Negative COVID testing, CMP, reassuring CBC at that time. ? ?History ?Patient Active Problem List  ? Diagnosis Date Noted  ? Hiatal hernia 08/02/2020  ? Laceration of left hand without foreign body 10/11/2016  ? Contusion of right elbow 10/11/2016  ? Paroxysmal atrial fibrillation ([place]) 10/24/2012  ? Hypercholesterolemia 04/03/2012  ? ED (erectile dysfunction) 03/05/2012  ? Hypogonadism male 03/05/2012  ? ?Past Medical History:  ?Diagnosis Date  ? Abdominal pain   ? Atrial fibrillation ([place])   ?  BCC (basal cell carcinoma) 09/14/2010  ? ulcerated, top right scalp, Treatment Zyclara  ? BCC (basal cell carcinoma) 02/05/2013  ? Recurrent, top right scalp, MOHs  ? BCC (basal cell carcinoma) 09/05/2017  ? Mico nod, left forehead, MOHs (Dr Link Snuffer)  ? Chest pain   ? negative stress echo in September 2013  ? Diverticular disease   ? Diverticulitis   ? Dysplastic nevus   ? moderate, Left back no treatment  ? GERD (gastroesophageal reflux disease)   ? ?Past Surgical History:  ?Procedure Laterality Date  ? COLON SURGERY    ? PARTIAL COLECTOMY  11/11  ? ?Allergies  ?Allergen Reactions  ? Penicillins   ?  rash  ? ?Prior to Admission medications   ?Medication Sig Start Date End Date Taking? Authorizing Provider  ?apixaban (ELIQUIS) 5 MG TABS tablet Take 1 tablet (5 mg total) by mouth 2 (two) times daily. 11/03/21  Yes [place]ique, Peter M, MD  ?B-D 3CC LUER-LOK SYR 23GX1" 23G X 1" 3 ML MISC SMARTSIG:1 Injection Once a Week 02/06/20  Yes [provider]  ?B-D HYPODERMIC NEEDLE 18GX1.5" 18G X 1-1/2" MISC USE TO DRAW UP MEDICATION 04/01/20  Yes [provider]  ?calcium carbonate (OS-CAL - DOSED IN MG OF ELEMENTAL CALCIUM) 1250 MG tablet Take 2 tablets by mouth 2 (two) times daily with a meal.    Yes  [provider]  ?cholecalciferol (VITAMIN D) 1000 UNITS tablet Take 1,000 Units by mouth 2 (two) times daily.   Yes [provider]  ?diltiazem (CARDIZEM CD) 240 MG 24 hr capsule TAKE 1 CAPSULE(240 MG) BY MOUTH DAILY 08/08/21  Yes Martinique, Peter M, MD  ?Docusate Sodium (COLACE PO) Take 1 tablet by mouth as directed.   Yes [provider]  ?Multiple Vitamin (MULTIVITAMIN) tablet Take 1 tablet by mouth daily.   Yes [provider]  ?Omega-3 Fatty Acids (OMEGA 3 PO) Take 2 capsules by mouth daily.    Yes [provider]  ?omeprazole (PRILOSEC) 20 MG capsule Take 1 capsule (20 mg total) by mouth daily. 03/05/12  Yes [name], MD  ?Probiotic Product (PROBIOTIC-10 PO) Take 1  tablet by mouth daily.   Yes [provider]  ?rosuvastatin (CRESTOR) 10 MG tablet Take 1 tablet (10 mg total) by mouth daily. 11/03/21 02/01/22 Yes Martinique, Peter M, MD  ?sildenafil (VIAGRA) 100 MG tablet Take 100 mg by mouth as needed. 08/30/21  Yes [provider]  ?tacrolimus (PROTOPIC) 0.1 % ointment Apply 1 application. topically daily. 10/19/21  Yes Lavonna Monarch, MD  ?testosterone cypionate (DEPOTESTOSTERONE CYPIONATE) 200 MG/ML injection Inject into the muscle once a week.    Yes [provider]  ? ?Social History  ? ?Socioeconomic History  ? Marital status: Married  ?  Spouse name: Not on file  ? Number of children: 0  ? Years of education: 27  ? Highest education level: Not on file  ?Occupational History  ? Occupation: attorney  ?  Employer: [employer]  ?Tobacco Use  ? Smoking status: Never  ? Smokeless tobacco: Never  ?Vaping Use  ? Vaping Use: Never used  ?Substance and Sexual Activity  ? Alcohol use: No  ? Drug use: No  ? Sexual activity: Yes  ?Other Topics Concern  ? Not on file  ?Social History Narrative  ? Married  ? Exercise: Yes  ? Education: College  ? Right handed   ? Decaffeine   ? Two story home  ? ?Social Determinants of Health  ? ?Financial Resource Strain: Not on file  ?Food Insecurity: Not on file  ?Transportation Needs: Not on file  ?Physical Activity: Not on file  ?Stress: Not on file  ?Social Connections: Not on file  ?Intimate Partner Violence: Not on file  ? ? ?Review of Systems ?Per HPI.  ? ?Objective:  ? ?Vitals:  ? 12/08/21 0901  ?BP: 108/62  ?Pulse: (!) 52  ?Resp: 16  ?Temp: 98.1 ?F (36.7 ?C)  ?TempSrc: Temporal  ?SpO2: 97%  ?Weight: 178 lb 3.2 oz (80.8 kg)  ?Height: '6\' 1"'$  (1.854 m)  ? ? ? ?Physical Exam ?Vitals reviewed.  ?Constitutional:   ?   General: He is not in acute distress. ?   Appearance: Normal appearance. He is well-developed. He is not ill-appearing.  ?HENT:  ?   Head: Normocephalic and atraumatic.  ?Neck:  ?   Vascular: No carotid bruit or  JVD.  ?Cardiovascular:  ?   Rate and Rhythm: Normal rate and regular rhythm.  ?   Heart sounds: Normal heart sounds. No murmur heard. ?Pulmonary:  ?   Effort: Pulmonary effort is normal.  ?   Breath sounds: Normal breath sounds. No rales.  ?Musculoskeletal:  ?   Right lower leg: No edema.  ?   Left lower leg: No edema.  ?   Comments: Pain-free range of motion of right shoulder, no  bony tenderness.  Full range of motion.  ?Skin: ?   General: Skin is warm and dry.  ?Neurological:  ?   Mental Status: He is alert and oriented to person, place, and time.  ?Psychiatric:     ?   Mood and Affect: Mood normal.  ? ? ? ?Assessment & Plan:  ?Delsin Copen. is a 66 y.o. male . ?Fatigue, unspecified type ? -Resolved, possible viral illness last visit without residual symptoms.  Previous blood work reassuring.  RTC precautions if recurs ? ?History of CVA (cerebrovascular accident) - Plan: Comprehensive metabolic panel, Lipid panel ?On anticoagulant therapy ? -Denies any headache or symptoms.  Updated lipid panel ordered in the next few weeks for secondary prevention, continue Crestor.  Denies any bleeding.  Continue Eliquis. ? ?Acute pain of right shoulder ?Myalgia ? -Resolved since last visit, possible overuse.  RTC precautions if recurs ? ?No orders of the defined types were placed in this encounter. ? ?Patient Instructions  ?Fasting labs at [facility] in next few weeks.  ?[facility] ?Walk in 8:30-4:30 during weekdays, no appointment needed ?[address]  ?[place], [place] [address] ? ?Glad you are feeling better! ? ? ? ?Signed,  ? ?Merri Ray, MD ?[place], [facility] ?[place] ?12/08/21 ?9:45 AM ? ? ?

## 2021-12-09 ENCOUNTER — Other Ambulatory Visit (INDEPENDENT_AMBULATORY_CARE_PROVIDER_SITE_OTHER): Payer: Medicare Other

## 2021-12-09 DIAGNOSIS — Z8673 Personal history of transient ischemic attack (TIA), and cerebral infarction without residual deficits: Secondary | ICD-10-CM | POA: Diagnosis not present

## 2021-12-09 DIAGNOSIS — M791 Myalgia, unspecified site: Secondary | ICD-10-CM

## 2021-12-09 LAB — LIPID PANEL
Cholesterol: 105 mg/dL (ref 0–200)
HDL: 44.3 mg/dL (ref >39.00)
LDL Cholesterol: 49 mg/dL (ref 0–99)
NonHDL: 61.01
Total CHOL/HDL Ratio: 2
Triglycerides: 59 mg/dL (ref 0.0–149.0)
VLDL: 11.8 mg/dL (ref 0.0–40.0)

## 2021-12-09 LAB — COMPREHENSIVE METABOLIC PANEL
ALT: 36 U/L (ref 0–53)
AST: 34 U/L (ref 0–37)
Albumin: 4.6 g/dL (ref 3.5–5.2)
Alkaline Phosphatase: 62 U/L (ref 39–117)
BUN: 16 mg/dL (ref 6–23)
CO2: 30 mEq/L (ref 19–32)
Calcium: 9.9 mg/dL (ref 8.4–10.5)
Chloride: 102 mEq/L (ref 96–112)
Creatinine, Ser: 1.13 mg/dL (ref 0.40–1.50)
GFR: 67.31 mL/min (ref >60.00)
Glucose, Bld: 80 mg/dL (ref 70–99)
Potassium: 4.1 mEq/L (ref 3.5–5.1)
Sodium: 140 mEq/L (ref 135–145)
Total Bilirubin: 0.9 mg/dL (ref 0.2–1.2)
Total Protein: 7.4 g/dL (ref 6.0–8.3)

## 2021-12-09 LAB — CK: Total CK: 175 U/L (ref 7–232)

## 2021-12-16 ENCOUNTER — Encounter: Payer: Self-pay | Admitting: Physician Assistant

## 2021-12-16 ENCOUNTER — Ambulatory Visit: Payer: Medicare Other | Admitting: Physician Assistant

## 2021-12-16 VITALS — BP 117/71 | HR 50 | Resp 18 | Ht 73.0 in | Wt 176.0 lb

## 2021-12-16 DIAGNOSIS — G3184 Mild cognitive impairment, so stated: Secondary | ICD-10-CM | POA: Diagnosis not present

## 2021-12-16 DIAGNOSIS — I6611 Occlusion and stenosis of right anterior cerebral artery: Secondary | ICD-10-CM | POA: Diagnosis not present

## 2021-12-16 DIAGNOSIS — I639 Cerebral infarction, unspecified: Secondary | ICD-10-CM | POA: Diagnosis not present

## 2021-12-16 DIAGNOSIS — I6501 Occlusion and stenosis of right vertebral artery: Secondary | ICD-10-CM

## 2021-12-16 NOTE — Patient Instructions (Addendum)
It was a pleasure to see you today at our office.   Recommendations:  Continue Eliquis daily Follow  up with Cardiology Referral to Physical Therapy for strength and Balance  Follow up in  6 months     RECOMMENDATIONS FOR ALL PATIENTS WITH MEMORY PROBLEMS: 1. Continue to exercise (Recommend 30 minutes of walking everyday, or 3 hours every week) 2. Increase social interactions - continue going to [place] and enjoy social gatherings with friends and family 3. Eat healthy, avoid fried foods and eat more fruits and vegetables 4. Maintain adequate blood pressure, blood sugar, and blood cholesterol level. Reducing the risk of stroke and cardiovascular disease also helps promoting better memory. 5. Avoid stressful situations. Live a simple life and avoid aggravations. Organize your time and prepare for the next day in anticipation. 6. Sleep well, avoid any interruptions of sleep and avoid any distractions in the bedroom that may interfere with adequate sleep quality 7. Avoid sugar, avoid sweets as there is a strong link between excessive sugar intake, diabetes, and cognitive impairment We discussed the Mediterranean diet, which has been shown to help patients reduce the risk of progressive memory disorders and reduces cardiovascular risk. This includes eating fish, eat fruits and green leafy vegetables, nuts like almonds and hazelnuts, walnuts, and also use olive oil. Avoid fast foods and fried foods as much as possible. Avoid sweets and sugar as sugar use has been linked to worsening of memory function.  There is always a concern of gradual progression of memory problems. If this is the case, then we may need to adjust level of care according to patient needs. Support, both to the patient and caregiver, should then be put into place.    FALL PRECAUTIONS: Be cautious when walking. Scan the area for obstacles that may increase the risk of trips and falls. When getting up in the mornings, sit up at the  edge of the bed for a few minutes before getting out of bed. Consider elevating the bed at the head end to avoid drop of blood pressure when getting up. Walk always in a well-lit room (use night lights in the walls). Avoid area rugs or power cords from appliances in the middle of the walkways. Use a walker or a cane if necessary and consider physical therapy for balance exercise. Get your eyesight checked regularly.  FINANCIAL OVERSIGHT: Supervision, especially oversight when making financial decisions or transactions is also recommended.  HOME SAFETY: Consider the safety of the kitchen when operating appliances like stoves, microwave oven, and blender. Consider having supervision and share cooking responsibilities until no longer able to participate in those. Accidents with firearms and other hazards in the house should be identified and addressed as well.   ABILITY TO BE LEFT ALONE: If patient is unable to contact 911 operator, consider using LifeLine, or when the need is there, arrange for someone to stay with patients. Smoking is a fire hazard, consider supervision or cessation. Risk of wandering should be assessed by caregiver and if detected at any point, supervision and safe proof recommendations should be instituted.  MEDICATION SUPERVISION: Inability to self-administer medication needs to be constantly addressed. Implement a mechanism to ensure safe administration of the medications.   DRIVING: Regarding driving, in patients with progressive memory problems, driving will be impaired. We advise to have someone else do the driving if trouble finding directions or if minor accidents are reported. Independent driving assessment is available to determine safety of driving.   If you are interested  in the driving assessment, you can contact the following:  [employer] in [place]  [place] [phone] or [phone]    [place] refers to food and lifestyle choices that are based on the traditions of countries located on the [employer]. This way of eating has been shown to help prevent certain conditions and improve outcomes for people who have chronic diseases, like kidney disease and heart disease. What are tips for following this plan? Lifestyle  Cook and eat meals together with your family, when possible. Drink enough fluid to keep your urine clear or pale yellow. Be physically active every day. This includes: Aerobic exercise like running or swimming. Leisure activities like gardening, walking, or housework. Get 7-8 hours of sleep each night. If recommended by your health care provider, drink red wine in moderation. This means 1 glass a day for nonpregnant women and 2 glasses a day for men. A glass of wine equals 5 oz (150 mL). Reading food labels  Check the serving size of packaged foods. For foods such as rice and pasta, the serving size refers to the amount of cooked product, not dry. Check the total fat in packaged foods. Avoid foods that have saturated fat or trans fats. Check the ingredients list for added sugars, such as corn syrup. Shopping  At the grocery store, buy most of your food from the areas near the walls of the store. This includes: Fresh fruits and vegetables (produce). Grains, beans, nuts, and seeds. Some of these may be available in unpackaged forms or large amounts (in bulk). Fresh seafood. Poultry and eggs. Low-fat dairy products. Buy whole ingredients instead of prepackaged foods. Buy fresh fruits and vegetables in-season from local farmers markets. Buy frozen fruits and vegetables in resealable bags. If you do not have access to quality fresh seafood, buy precooked frozen shrimp or canned fish, such as tuna, salmon, or sardines. Buy small amounts of raw or cooked  vegetables, salads, or olives from the deli or salad bar at your store. Stock your pantry so you always have certain foods on hand, such as olive oil, canned tuna, canned tomatoes, rice, pasta, and beans. Cooking  Cook foods with extra-virgin olive oil instead of using butter or other vegetable oils. Have meat as a side dish, and have vegetables or grains as your main dish. This means having meat in small portions or adding small amounts of meat to foods like pasta or stew. Use beans or vegetables instead of meat in common dishes like chili or lasagna. Experiment with different cooking methods. Try roasting or broiling vegetables instead of steaming or sauteing them. Add frozen vegetables to soups, stews, pasta, or rice. Add nuts or seeds for added healthy fat at each meal. You can add these to yogurt, salads, or vegetable dishes. Marinate fish or vegetables using olive oil, lemon juice, garlic, and fresh herbs. Meal planning  Plan to eat 1 vegetarian meal one day each week. Try to work up to 2 vegetarian meals, if possible. Eat seafood 2 or more times a week. Have healthy snacks readily available, such as: Vegetable sticks with hummus. Greek yogurt. Fruit and nut trail mix. Eat balanced meals throughout the week. This includes: Fruit: 2-3 servings a day Vegetables: 4-5 servings a day Low-fat dairy: 2 servings a day Fish, poultry, or lean meat: 1 serving a day Beans and legumes: 2 or more servings a week Nuts  and seeds: 1-2 servings a day Whole grains: 6-8 servings a day Extra-virgin olive oil: 3-4 servings a day Limit red meat and sweets to only a few servings a month What are my food choices? Mediterranean diet Recommended Grains: Whole-grain pasta. Brown rice. Bulgar wheat. Polenta. Couscous. Whole-wheat bread. [place]. Vegetables: Artichokes. Beets. Broccoli. Cabbage. Carrots. Eggplant. Green beans. Chard. Kale. Spinach. Onions. Leeks. Peas. Squash. Tomatoes. Peppers.  Radishes. Fruits: Apples. Apricots. Avocado. Berries. Bananas. Cherries. Dates. Figs. Grapes. Lemons. Melon. Oranges. Peaches. Plums. Pomegranate. Meats and other protein foods: Beans. Almonds. Sunflower seeds. Pine nuts. Peanuts. [place]. Salmon. Scallops. Shrimp. [place]. Tilapia. Clams. Oysters. Eggs. Dairy: Low-fat milk. Cheese. Greek yogurt. Beverages: Water. Red wine. Herbal tea. Fats and oils: Extra virgin olive oil. Avocado oil. Grape seed oil. Sweets and desserts: [place] yogurt with honey. Baked apples. Poached pears. Trail mix. Seasoning and other foods: Basil. Cilantro. Coriander. Cumin. Mint. Parsley. Sage. Rosemary. Tarragon. Garlic. Oregano. Thyme. Pepper. Balsalmic vinegar. Tahini. Hummus. Tomato sauce. Olives. Mushrooms. Limit these Grains: Prepackaged pasta or rice dishes. Prepackaged cereal with added sugar. Vegetables: Deep fried potatoes (french fries). Fruits: Fruit canned in syrup. Meats and other protein foods: Beef. Pork. Lamb. Poultry with skin. Hot dogs. [place] Salines. Dairy: Ice cream. Sour cream. Whole milk. Beverages: Juice. Sugar-sweetened soft drinks. Beer. Liquor and spirits. Fats and oils: Butter. Canola oil. Vegetable oil. Beef fat (tallow). Lard. Sweets and desserts: Cookies. Cakes. Pies. Candy. Seasoning and other foods: Mayonnaise. Premade sauces and marinades. The items listed may not be a complete list. Talk with your dietitian about what dietary choices are right for you. Summary The Mediterranean diet includes both food and lifestyle choices. Eat a variety of fresh fruits and vegetables, beans, nuts, seeds, and whole grains. Limit the amount of red meat and sweets that you eat. Talk with your health care provider about whether it is safe for you to drink red wine in moderation. This means 1 glass a day for nonpregnant women and 2 glasses a day for men. A glass of wine equals 5 oz (150 mL). This information is not intended to replace advice given to you by your health  care provider. Make sure you discuss any questions you have with your health care provider. Document Released: 03/09/2016 Document Revised: 04/11/2016 Document Reviewed: 03/09/2016 Elsevier Interactive Patient Education  2017 [employer].  We have sent a referral to [facility] for your MRI and they will call you directly to schedule your appointment. They are located at [place]. If you need to contact them directly please call [phone].  Your provider has requested that you have labwork completed today. Please go to [facility] ([id number]) on the second floor of this building before leaving the office today. You do not need to check in. If you are not called within 15 minutes please check with the front desk.

## 2021-12-16 NOTE — Progress Notes (Signed)
Assessment/Plan:   Mild Cognitive Impairment   Last MoCA in February 2023 was 29/30, no indication for antidementia medication at this time.  MRI of the brain on 10/10/2021 shows moderate for age cerebral white matter changes likely due to chronic small vessel disease, with a subtle area in the left occipital pole suggestive of subacute cortical infarct without associated edema or mass effect.  MRA of the head and neck negative for intracranial large vessel occlusion, moderate stenosis within the A2 segment of the right anterior cerebral artery.  Patient is on Eliquis by cardiology.   Recommendations:   Continue Eliquis daily and follow with Cardiology Referral to PT for strength and balance  No indication for antidementia medication  Recommended to use a cane on uneven grounds to prevent any other falls. Follow up in  6 months.   Case discussed with Dr. Delice Lesch who agrees with the plan     Subjective:    Nicholas West. is a very pleasant 66 y.o. RH male  seen today in follow up for memory loss. This patient is accompanied in the office by his  wife who supplements the history.  Previous records as well as any outside records available were reviewed prior to todays visit.  Patient was last seen at our office on 10/14/21.  He is not on antidementia medications.   Any changes in memory since last visit?  Patient denies any changes, he may still have some issues with short-term memory, but he continues to do "the best he can to help his memory ".  He still has some trouble with multitasking and directions. Patient lives with: Spouse  repeats oneself?  At times Disoriented when walking into a room?  Patient denies   Leaving objects in unusual places?  Patient denies   Ambulates  with difficulty?  He has unsteadiness on his feet, and he has learned to hold onto the railing to prevent falls.  He did experience a fall on 11/06/2021, hitting his head, but did not lose  consciousness, History of seizures?   Patient denies.  Most recent EEG was normal. Wandering behavior?  Patient denies   Patient drives?   He drives with GPS, denies getting lost.   Any mood changes such irritability agitation?  Patient denies   Any history of depression?:  Patient denies   Hallucinations?  Patient denies   Paranoia?  Patient denies   Patient reports that he sleeps well without vivid dreams, REM behavior or sleepwalking.  He may toss and turn at times.  History of sleep apnea?  Patient denies   Any hygiene concerns?  Patient denies   Independent of bathing and dressing?  Endorsed  Does the patient needs help with medications?  His wife puts the medications in a pillbox and he takes them by himself.   Who is in charge of the finances?  They both do the finances together.  Any changes in appetite?  Patient denies   Patient have trouble swallowing? Patient denies   Does the patient cook?  Patient denies   Any kitchen accidents such as leaving the stove on? Patient denies   Any headaches?  Patient denies   The double vision? Patient denies   Any focal numbness or tingling?  Patient denies   Chronic back pain Patient denies   Unilateral weakness?  Patient denies   Any tremors?  Patient denies   Any history of anosmia?  Patient denies   Any incontinence of urine?  Patient  denies   Any bowel dysfunction?   Patient denies            Initial Visit 09/16/21 The patient is seen in neurologic consultation at the request of Wendie Agreste, MD for the evaluation of memory.  The patient is accompanied by his wife  who supplements the history. 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  She states that he cannot remember recent conversations, and within 15 minutes he will ask again.  His short-term memory is worse than the long-term memory.  He  denies anyone noticing at work that he has any cognitive difficulties.  She states that he used to be able to drive without difficulties, and now, while driving, he is distracted, and at times will pass the turn.  He cannot multitask with directions.  Despite having a calendar, first in the morning his wife has to put a note in the mirror in the bathroom otherwise he will forget.  She states that he repeats himself more frequently. He denies being disoriented when walking into a room.  He denies leaving objects in unusual places.  He ambulates without difficulty,  He has some unsteadiness on his feet, especially when taking steps up to the kitchen from the garage.  He has to hold the railing otherwise he would fall.  This has been present for several months, during the falls, he denies any loss of consciousness, "I only hurt to my pride " he says.  In July, during a family vacation, he states that his feet went unsteady, but then again "the sand was too soft ".  He does report for many years leaning to the left.  He denies any prior history of TIA or stroke.  He is unsure if the blood pressure medications may be contributing to the sense dizziness.  He denies any lack of sensations on his legs or arms.  He also has noticed that for several years he had some right greater than left head bobbing, and his wife reports that sometimes he has some left jerky movements of his arm.  He denies any history of seizures or PArkinsonism.  His mood is good, denies depression or irritability.  His wife states that he has significant REM behavior at night, moving and tossing, possibly acting out.  He does not know if he sleeps well.  He denies remembering any vivid dreams or sleepwalking.  He denies hallucinations or paranoia. History of 2 falls previously, no recent falls.  Reports sensation of his legs not cooperating and losing balance. Had to catch self once since last visit. No pain, just unsteady.  His appetite is good, he  eats 3 meals a day, drinking plenty fluids during the day .  He is active, he does occasionally fast to walk during the week.  Denies any hygiene concerns, he is independent of bathing and dressing.  His wife puts the medications out in a pillbox for him while he is in the shower.  They do the finances together, he reports no issues.  He was concerned recently, because at work, he could not remember the password in his computer, needing to get it out of his own personal computer.  He denies any headaches, he had some episodes of double vision, last one recently went back in to go to the [place], when he "got hot and  tired ".  He denies any blurred vision.  He denies any dizziness, focal numbness or tingling, or anosmia.  No history of seizures.  He denies any tremors.  He denies urine incontinence, retention, constipation or diarrhea.  He denies a history of sleep apnea, alcohol, tobacco. He reports a history of melanocytic nevus s.p large excision of the scal on the left. He also had noticed a recent rash on the forehead. He has an appt with the dermatologist soon. His sister died with the advanced dementia at 20, father with Alzheimer's disease at 57.    MRI brain without contrast 10/10/21 Moderate for age cerebral white matter signal changes are nonspecific but most commonly due to chronic small vessel disease. And a subtle area of gyriform enhancement in the left occipital pole is most suggestive of a subacute cortical infarct. No associated edema or mass effect. No other acute intracranial abnormality.  MRA 10/26/20 No intracranial large vessel occlusion.Apparent moderate stenosis within the A2 segment of the right anterior cerebral artery.   Labs 09/16/2021 with normal CMP, CBC, sed rate, B12, TSH, ammonia, RPR, HIV, ANA.    Past Medical History:  Diagnosis Date   Abdominal pain    Atrial fibrillation ([place])    BCC (basal cell carcinoma) 09/14/2010   ulcerated, top right scalp, Treatment Zyclara    BCC (basal cell carcinoma) 02/05/2013   Recurrent, top right scalp, MOHs   BCC (basal cell carcinoma) 09/05/2017   Mico nod, left forehead, MOHs (Dr Link Snuffer)   Chest pain    negative stress echo in September 2013   Diverticular disease    Diverticulitis    Dysplastic nevus    moderate, Left back no treatment   GERD (gastroesophageal reflux disease)      Past Surgical History:  Procedure Laterality Date   COLON SURGERY     PARTIAL COLECTOMY  11/11     PREVIOUS MEDICATIONS:   CURRENT MEDICATIONS:  Outpatient Encounter Medications as of 12/16/2021  Medication Sig   apixaban (ELIQUIS) 5 MG TABS tablet Take 1 tablet (5 mg total) by mouth 2 (two) times daily.   B-D 3CC LUER-LOK SYR 23GX1" 23G X 1" 3 ML MISC SMARTSIG:1 Injection Once a Week   B-D HYPODERMIC NEEDLE 18GX1.5" 18G X 1-1/2" MISC USE TO DRAW UP MEDICATION   calcium carbonate (OS-CAL - DOSED IN MG OF ELEMENTAL CALCIUM) 1250 MG tablet Take 2 tablets by mouth 2 (two) times daily with a meal.    cholecalciferol (VITAMIN D) 1000 UNITS tablet Take 1,000 Units by mouth 2 (two) times daily.   diltiazem (CARDIZEM CD) 240 MG 24 hr capsule TAKE 1 CAPSULE(240 MG) BY MOUTH DAILY   Docusate Sodium (COLACE PO) Take 1 tablet by mouth as directed.   Multiple Vitamin (MULTIVITAMIN) tablet Take 1 tablet by mouth daily.   Omega-3 Fatty Acids (OMEGA 3 PO) Take 2 capsules by mouth daily.    omeprazole (PRILOSEC) 20 MG capsule Take 1 capsule (20 mg total) by mouth daily.   Probiotic Product (PROBIOTIC-10 PO) Take 1 tablet by mouth daily.   rosuvastatin (CRESTOR) 10 MG tablet Take 1 tablet (10 mg total) by mouth daily.   sildenafil (VIAGRA) 100 MG tablet Take 100 mg by mouth as needed.   tacrolimus (PROTOPIC) 0.1 % ointment Apply 1 application. topically daily.   testosterone cypionate (DEPOTESTOSTERONE CYPIONATE) 200 MG/ML injection Inject into the muscle once a week.    No facility-administered encounter medications on file as of 12/16/2021.      Objective:  PHYSICAL EXAMINATION:    VITALS:  There were no vitals filed for this visit.  GEN:  The patient appears stated age and is in NAD. HEENT:  Normocephalic, atraumatic.   Neurological examination:  General: NAD, well-groomed, appears stated age. Orientation: The patient is alert. Oriented to person, place and date Cranial nerves: There is good facial symmetry.The speech is fluent and clear. No aphasia or dysarthria. Fund of knowledge is appropriate. Recent memory impaired and remote memory is normal.  Attention and concentration are normal.  Able to name objects and repeat phrases.  Hearing is intact to conversational tone.    Sensation: Sensation is intact to light touch throughout Motor: Strength is at least antigravity x4. Tremors: none  DTR's 2/4 in UE/LE      09/18/2021    8:00 PM  Montreal Cognitive Assessment   Visuospatial/ Executive (0/5) 4  Naming (0/3) 3  Attention: Read list of digits (0/2) 2  Attention: Read list of letters (0/1) 1  Attention: Serial 7 subtraction starting at 100 (0/3) 3  Language: Repeat phrase (0/2) 2  Language : Fluency (0/1) 1  Abstraction (0/2) 2  Delayed Recall (0/5) 5  Orientation (0/6) 6  Total 29  Adjusted Score (based on education) 29        View : No data to display.             Movement examination: Tone: There is normal tone in the UE/LE Abnormal movements:  no tremor.  No myoclonus.  No asterixis.   Coordination:  There is no decremation with RAM's. Normal finger to nose  Gait and Station: The patient has no difficulty arising out of a deep-seated chair without the use of the hands. The patient's stride length is good.  Gait is cautious and narrow.   Thank you for allowing [place] the opportunity to participate in the care of this nice patient. Please do not hesitate to contact us for any questions or concerns.   Total time spent on today's visit was 33 minutes dedicated to this patient today, preparing to  see patient, examining the patient, ordering tests and/or medications and counseling the patient, documenting clinical information in the EHR or other health record, independently interpreting results and communicating results to the patient/family, discussing treatment and goals, answering patient's questions and coordinating care.  Cc:  Wendie Agreste, MD  Sharene Butters 12/18/2021 12:07 PM

## 2021-12-20 ENCOUNTER — Encounter: Payer: Self-pay | Admitting: Physical Therapy

## 2021-12-20 ENCOUNTER — Ambulatory Visit: Payer: Medicare Other | Attending: Physician Assistant | Admitting: Physical Therapy

## 2021-12-20 VITALS — BP 134/78 | HR 64

## 2021-12-20 DIAGNOSIS — R2689 Other abnormalities of gait and mobility: Secondary | ICD-10-CM | POA: Diagnosis not present

## 2021-12-20 DIAGNOSIS — I639 Cerebral infarction, unspecified: Secondary | ICD-10-CM | POA: Diagnosis not present

## 2021-12-20 DIAGNOSIS — I6501 Occlusion and stenosis of right vertebral artery: Secondary | ICD-10-CM | POA: Insufficient documentation

## 2021-12-20 DIAGNOSIS — R2681 Unsteadiness on feet: Secondary | ICD-10-CM | POA: Insufficient documentation

## 2021-12-20 DIAGNOSIS — M6281 Muscle weakness (generalized): Secondary | ICD-10-CM | POA: Diagnosis not present

## 2021-12-20 NOTE — Therapy (Signed)
OUTPATIENT PHYSICAL THERAPY NEURO EVALUATION   Patient Name: Nicholas West. MRN: [id number] DOB:June 05, 1956, 66 y.o., male Today's Date: 12/20/2021   PCP: Crist Infante, MD REFERRING PROVIDER: Rondel Jumbo, PA-C    PT End of Session - 12/20/21 0855     Visit Number 1    Authorization Type BCBS MEDICARE    Progress Note Due on Visit 10    PT Start Time 0845    PT Stop Time 0933    PT Time Calculation (min) 48 min    Equipment Utilized During Treatment Gait belt    Activity Tolerance Patient tolerated treatment well    Behavior During Therapy WFL for tasks assessed/performed             Past Medical History:  Diagnosis Date   Abdominal pain    Atrial fibrillation ([place])    BCC (basal cell carcinoma) 09/14/2010   ulcerated, top right scalp, Treatment Zyclara   BCC (basal cell carcinoma) 02/05/2013   Recurrent, top right scalp, MOHs   BCC (basal cell carcinoma) 09/05/2017   Mico nod, left forehead, MOHs (Dr Link Snuffer)   Chest pain    negative stress echo in September 2013   Diverticular disease    Diverticulitis    Dysplastic nevus    moderate, Left back no treatment   GERD (gastroesophageal reflux disease)    Past Surgical History:  Procedure Laterality Date   COLON SURGERY     PARTIAL COLECTOMY  11/11   Patient Active Problem List   Diagnosis Date Noted   Hiatal hernia 08/02/2020   Laceration of left hand without foreign body 10/11/2016   Contusion of right elbow 10/11/2016   Paroxysmal atrial fibrillation ([place]) 10/24/2012   Hypercholesterolemia 04/03/2012   ED (erectile dysfunction) 03/05/2012   Hypogonadism male 03/05/2012    ONSET DATE: 12/16/2021   REFERRING DIAG: I63.9 (ICD-10-CM) - Cerebrovascular accident (CVA), unspecified mechanism ([place]) I65.01 (ICD-10-CM) - Asymptomatic stenosis of right vertebral artery   THERAPY DIAG:  Other abnormalities of gait and mobility  Muscle weakness (generalized)  Unsteadiness on feet  Rationale  for Evaluation and Treatment Rehabilitation  SUBJECTIVE:                                                                                                                                                                                              SUBJECTIVE STATEMENT: "When I exercise vigorously walking on the treadmill every morning and walking for an hour with my wife my vision sometimes gets a little doubled.  I'm wobbly if I have to walk in the gutter that's slanted when the sidewalk ends  too.  It takes me a minute to get my footing.  And I don't do any stairs without a rail, I've learned my lesson." Pt accompanied by: self  PERTINENT HISTORY: Pt was noted to have had a subacute CVA in the left occipital pole at cardiology visit for a-fib on 11/03/2021.  It is also documented that MRA showed modest stenosis in the A2 segment of the right anterior cerebral artery. Pt presented to [place] ED on 11/06/2021 following a fall resulting in hitting his head w/o loss of consciousness.  He was on Eliquis for a-fib at time of fall.  He fell onto his left shoulder resulting in sharp pain.  Was referred to [facility] for shoulder discomfort.  PAIN:  Are you having pain? No  PRECAUTIONS: Fall  WEIGHT BEARING RESTRICTIONS No  FALLS: Has patient fallen in last 6 months? Yes. Number of falls 2  LIVING ENVIRONMENT: Lives with: lives with their spouse Lives in: House/apartment Stairs: Yes: Internal: 15 steps; on the right for the first 10 steps to landing and then the last 5 it's on the left and External: 4 steps; on left going up (garage entrance) Has following equipment at home: None  PLOF: Independent  PATIENT GOALS "I guess it would be to control the wobbly part, but to try to make sure it doesn't get any worse than it already is."  OBJECTIVE:   DIAGNOSTIC FINDINGS: Left occipital CVA and right ACA stenosis noted in cardiology note from 11/03/2021 (MRI not visible).  Imaging from 11/06/2021 shows no  acute infarcts.  COGNITION: Overall cognitive status: Within functional limits for tasks assessed  Short term memory noted to be a problem on the [place] from Dr. Vonna Kotyk office. SENSATION: WFL  COORDINATION: Heel-to-shin:  WFL bilaterally  EDEMA:  None noted in BLE  MUSCLE TONE: None noted in BLE   POSTURE: No Significant postural limitations  LOWER EXTREMITY ROM:     Active  Right Eval Left Eval  Hip flexion WNL WNL  Hip extension    Hip abduction    Hip adduction    Hip internal rotation    Hip external rotation    Knee flexion    Knee extension    Ankle dorsiflexion    Ankle plantarflexion    Ankle inversion    Ankle eversion     (Blank rows = not tested)  LOWER EXTREMITY MMT:    MMT Right Eval Left Eval  Hip flexion Grossly 4+/5 Grossly 4+/5  Hip extension    Hip abduction    Hip adduction    Hip internal rotation    Hip external rotation    Knee flexion    Knee extension    Ankle dorsiflexion    Ankle plantarflexion    Ankle inversion    Ankle eversion    (Blank rows = not tested)  BED MOBILITY:  Independent per pt  TRANSFERS: Assistive device utilized: None  Sit to stand: Complete Independence Stand to sit: Complete Independence Chair to chair: Complete Independence  STAIRS:  Level of Assistance: Complete Independence  Stair Negotiation Technique: Alternating Pattern  with Single Rail on Right  Number of Stairs: 4   Height of Stairs: 6"  Comments:   GAIT: Gait pattern: WFL and step through pattern Distance walked: 345' Assistive device utilized: None Level of assistance: Complete Independence Comments: Pt ambulates w/ good consistent speed, large steps, no overt LOB over level indoor surface.  FUNCTIONAL TESTs:  5 times sit to stand: 13.16 seconds no  UE support Timed up and go (TUG): 9.39sec 10 meter walk test: 7.91 sec = 1.26 m/sec OR 4.17 ft/sec Functional gait assessment: 25/30  [facility] PT Assessment - 12/20/21 0922        Functional Gait  Assessment   Gait assessed  Yes    Gait Level Surface Walks 20 ft in less than 5.5 sec, no assistive devices, good speed, no evidence for imbalance, normal gait pattern, deviates no more than 6 in outside of the 12 in walkway width.    Change in Gait Speed Able to smoothly change walking speed without loss of balance or gait deviation. Deviate no more than 6 in outside of the 12 in walkway width.    Gait with Horizontal Head Turns Performs head turns smoothly with slight change in gait velocity (eg, minor disruption to smooth gait path), deviates 6-10 in outside 12 in walkway width, or uses an assistive device.    Gait with Vertical Head Turns Performs task with slight change in gait velocity (eg, minor disruption to smooth gait path), deviates 6 - 10 in outside 12 in walkway width or uses assistive device    Gait and Pivot Turn Pivot turns safely within 3 sec and stops quickly with no loss of balance.    Step Over Obstacle Is able to step over 2 stacked shoe boxes taped together (9 in total height) without changing gait speed. No evidence of imbalance.    Gait with Narrow Base of Support Ambulates 7-9 steps.    Gait with Eyes Closed Walks 20 ft, uses assistive device, slower speed, mild gait deviations, deviates 6-10 in outside 12 in walkway width. Ambulates 20 ft in less than 9 sec but greater than 7 sec.    Ambulating Backwards Walks 20 ft, no assistive devices, good speed, no evidence for imbalance, normal gait    Steps Alternating feet, must use rail.    Total Score 25             PATIENT SURVEYS:  FOTO 74%  TODAY'S TREATMENT:  See HEP. Reviewed fall prevention strategies and fatigue related to exertion and pacing of activity.   PATIENT EDUCATION: Education details: PT POC, assessments used, and goals set. Person educated: Patient Education method: Explanation Education comprehension: verbalized understanding   HOME EXERCISE PROGRAM: Access Code:  [email or web address] URL: [email or web address] Date: 12/20/2021 Prepared by: Elease Etienne  Exercises - Tandem Walking with Counter Support  - 1 x daily - 7 x weekly - 3 sets - 10 reps - Backward Tandem Walking with Counter Support  - 1 x daily - 7 x weekly - 3 sets - 10 reps - Standing Balance in Corner with Eyes Closed  - 1 x daily - 7 x weekly - 3 sets - 10 reps - Tandem Stance with Eyes Closed in Corner  - 1 x daily - 7 x weekly - 3 sets - 10 reps - Standing Near Stance in [place] with Eyes Closed  - 1 x daily - 7 x weekly - 3 sets - 10 reps - Wide Stance with Eyes Closed and Head Rotation  - 1 x daily - 7 x weekly - 3 sets - 10 reps   ASSESSMENT:  CLINICAL IMPRESSION: Patient is a 66 y.o. male who was seen today for physical therapy evaluation and treatment for subacute left occipital CVA and imbalance.  Pt has a significant PMH of hypercholestrerolemia, paroxysmal a-fib-on eliquis, and CVA .  Identified impairments include mild dynamic imbalance noted with narrowed BOS or  visually limited conditions.  Pt has significant history of falls w/ unlevel surfaces and stairs.  PT issued HEP with instruction to return if condition changes.  At this time not appropriate for continuous PT services.   CLINICAL DECISION MAKING: Stable/uncomplicated  EVALUATION COMPLEXITY: Low     Bary Richard, PT, DPT 12/20/2021, 9:33 AM

## 2022-01-19 DIAGNOSIS — C44311 Basal cell carcinoma of skin of nose: Secondary | ICD-10-CM | POA: Diagnosis not present

## 2022-02-10 DIAGNOSIS — I639 Cerebral infarction, unspecified: Secondary | ICD-10-CM | POA: Diagnosis not present

## 2022-02-10 DIAGNOSIS — I480 Paroxysmal atrial fibrillation: Secondary | ICD-10-CM | POA: Diagnosis not present

## 2022-02-10 LAB — BASIC METABOLIC PANEL
BUN/Creatinine Ratio: 16 (ref 10–24)
BUN: 15 mg/dL (ref 8–27)
CO2: 27 mmol/L (ref 20–29)
Calcium: 9.7 mg/dL (ref 8.6–10.2)
Chloride: 103 mmol/L (ref 96–106)
Creatinine, Ser: 0.93 mg/dL (ref 0.76–1.27)
Glucose: 83 mg/dL (ref 70–99)
Potassium: 4.7 mmol/L (ref 3.5–5.2)
Sodium: 141 mmol/L (ref 134–144)
eGFR: 90 mL/min/{1.73_m2} (ref >59)

## 2022-02-10 LAB — HEPATIC FUNCTION PANEL
ALT: 37 IU/L (ref 0–44)
AST: 34 IU/L (ref 0–40)
Albumin: 4.7 g/dL (ref 3.9–4.9)
Alkaline Phosphatase: 79 IU/L (ref 44–121)
Bilirubin Total: 0.8 mg/dL (ref 0.0–1.2)
Bilirubin, Direct: 0.22 mg/dL (ref 0.00–0.40)
Total Protein: 7.3 g/dL (ref 6.0–8.5)

## 2022-02-10 LAB — LIPID PANEL
Chol/HDL Ratio: 2.5 ratio (ref 0.0–5.0)
Cholesterol, Total: 121 mg/dL (ref 100–199)
HDL: 48 mg/dL (ref >39)
LDL Chol Calc (NIH): 60 mg/dL (ref 0–99)
Triglycerides: 58 mg/dL (ref 0–149)
VLDL Cholesterol Cal: 13 mg/dL (ref 5–40)

## 2022-05-03 ENCOUNTER — Ambulatory Visit: Payer: Medicare Other

## 2022-05-03 NOTE — Progress Notes (Signed)
[place] Date of Birth: 07/14/1956 Medical Record #[id number]  History of Present Illness: Nicholas West is seen for follow up of atrial fibrillation.   He has a history of paroxysmal atrial fibrillation. He has been managed with rate control with diltiazem. He previously had a [place] vascular score of 0 and was  taking a baby aspirin daily.   In 2019 he complained of nausea and tingling in his arms with exercise. We arranged a POET. On arrival he was in AFib with rate 98. He exercised for 9 minutes on the [place] protocol without symptoms or ST changes. He converted to NSR then went back into atrial flutter with RVR. His Cardizem was increased to 240 mg daily.    Recently he was experiencing some issues with his balance and memory loss. He had a couple of falls. Was evaluated by Dr Shawn Route. EEG and extensive lab work OK. Cranial MRI showed evidence of subacute infarct in the  left occipital pole. MRA showed modest stenosis in the A2 segment of the right anterior cerebral artery. He denies any chest pain, palpitations. No dyspnea.   He does report he is due for colonoscopy with Dr Michail Sermon. He is planning on retiring at the end of this year. He is walking daily and following a healthy diet. Has lost weight.  Current Outpatient Medications on File Prior to Visit  Medication Sig Dispense Refill   apixaban (ELIQUIS) 5 MG TABS tablet Take 1 tablet (5 mg total) by mouth 2 (two) times daily. 180 tablet 3   B-D 3CC LUER-LOK SYR 23GX1" 23G X 1" 3 ML MISC SMARTSIG:1 Injection Once a Week     B-D HYPODERMIC NEEDLE 18GX1.5" 18G X 1-1/2" MISC USE TO DRAW UP MEDICATION     calcium carbonate (OS-CAL - DOSED IN MG OF ELEMENTAL CALCIUM) 1250 MG tablet Take 2 tablets by mouth 2 (two) times daily with a meal.      cholecalciferol (VITAMIN D) 1000 UNITS tablet Take 1,000 Units by mouth 2 (two) times daily.     diltiazem (CARDIZEM CD) 240 MG 24 hr capsule TAKE 1 CAPSULE(240 MG) BY MOUTH DAILY 90 capsule 3    Docusate Sodium (COLACE PO) Take 1 tablet by mouth as directed.     Multiple Vitamin (MULTIVITAMIN) tablet Take 1 tablet by mouth daily.     Omega-3 Fatty Acids (OMEGA 3 PO) Take 2 capsules by mouth daily.      omeprazole (PRILOSEC) 20 MG capsule Take 1 capsule (20 mg total) by mouth daily. 30 capsule 11   Probiotic Product (PROBIOTIC-10 PO) Take 1 tablet by mouth daily.     sildenafil (VIAGRA) 100 MG tablet Take 100 mg by mouth as needed.     tacrolimus (PROTOPIC) 0.1 % ointment Apply 1 application. topically daily. 100 g 2   testosterone cypionate (DEPOTESTOSTERONE CYPIONATE) 200 MG/ML injection Inject into the muscle once a week.      rosuvastatin (CRESTOR) 10 MG tablet Take 1 tablet (10 mg total) by mouth daily. 90 tablet 3   No current facility-administered medications on file prior to visit.    Allergies  Allergen Reactions   Penicillins     rash    Past Medical History:  Diagnosis Date   Abdominal pain    Atrial fibrillation ([place])    BCC (basal cell carcinoma) 09/14/2010   ulcerated, top right scalp, Treatment Zyclara   BCC (basal cell carcinoma) 02/05/2013   Recurrent, top right scalp, MOHs   BCC (basal cell carcinoma)  09/05/2017   Mico nod, left forehead, MOHs (Dr Link Snuffer)   Chest pain    negative stress echo in September 2013   Diverticular disease    Diverticulitis    Dysplastic nevus    moderate, Left back no treatment   GERD (gastroesophageal reflux disease)     Past Surgical History:  Procedure Laterality Date   COLON SURGERY     PARTIAL COLECTOMY  11/11    Social History   Tobacco Use  Smoking Status Never  Smokeless Tobacco Never    Social History   Substance and Sexual Activity  Alcohol Use No    Family History  Problem Relation Age of Onset   Cancer Mother    Alzheimer's disease Father    Colon polyps Father    GER disease Father    Heart disease Paternal Grandmother    Cancer Paternal Grandfather     Review of Systems: The  review of systems is per the HPI.  All other systems were reviewed and are negative.  Physical Exam: BP 124/66 (BP Location: Left Arm, Patient Position: Sitting, Cuff Size: Normal)   Pulse 98   Ht '6\' 1"'$  (1.854 m)   Wt 167 lb 3.2 oz (75.8 kg)   SpO2 100%   BMI 22.06 kg/m  GENERAL:  Well appearing WM in NAD HEENT:  PERRL, EOMI, sclera are clear. Oropharynx is clear. NECK:  No jugular venous distention, carotid upstroke brisk and symmetric, no bruits, no thyromegaly or adenopathy LUNGS:  Clear to auscultation bilaterally CHEST:  Unremarkable HEART:  RRR,  PMI not displaced or sustained,S1 and S2 within normal limits, no S3, no S4: no clicks, no rubs, no murmurs ABD:  Soft, nontender. BS +, no masses or bruits. No hepatomegaly, no splenomegaly EXT:  2 + pulses throughout, no edema, no cyanosis no clubbing SKIN:  Warm and dry.  No rashes NEURO:  Alert and oriented x 3. Cranial nerves II through XII intact. PSYCH:  Cognitively intact  LABORATORY DATA:   Lab Results  Component Value Date   WBC 8.9 11/25/2021   HGB 16.3 11/25/2021   HCT 48.0 11/25/2021   PLT 165.0 11/25/2021   GLUCOSE 83 02/10/2022   CHOL 121 02/10/2022   TRIG 58 02/10/2022   HDL 48 02/10/2022   LDLCALC 60 02/10/2022   ALT 37 02/10/2022   AST 34 02/10/2022   NA 141 02/10/2022   K 4.7 02/10/2022   CL 103 02/10/2022   CREATININE 0.93 02/10/2022   BUN 15 02/10/2022   CO2 27 02/10/2022   TSH 2.05 09/16/2021   PSA 1.38 10/29/2015   INR 1.10 06/04/2013   HGBA1C 5.4 08/04/2021     ETT 08/31/17: Study Highlights   Blood pressure demonstrated a blunted response to exercise. Patient started the test in atrial fibrillation and converted to sinus rhythm and subsequently atrial flutter with rapid ventricular response with exercise. There was no ST segment deviation noted during stress. Negative, adequate stress test.   Echo 10/25/21: IMPRESSIONS     1. Left ventricular ejection fraction, by estimation, is 60%.  Left  ventricular ejection fraction by 3D volume is 58 %. The left ventricle has  normal function. The left ventricle has no regional wall motion  abnormalities. Left ventricular diastolic  parameters are consistent with Grade I diastolic dysfunction (impaired  relaxation). The average left ventricular global longitudinal strain is  -20.0 %. The global longitudinal strain is normal.   2. Right ventricular systolic function is normal. The right ventricular  size is normal. Tricuspid regurgitation signal is inadequate for assessing  PA pressure.   3. The mitral valve is normal in structure. Trivial mitral valve  regurgitation. No evidence of mitral stenosis.   4. The aortic valve is tricuspid. Aortic valve regurgitation is not  visualized. No aortic stenosis is present.   5. The inferior vena cava is normal in size with greater than 50%  respiratory variability, suggesting right atrial pressure of 3 mmHg.   Comparison(s): No prior Echocardiogram from records available.   Conclusion(s)/Recommendation(s): No intracardiac source of embolism  detected on this transthoracic study. Consider a transesophageal  echocardiogram to exclude cardiac source of embolism if clinically  indicated.   Assessment / Plan: 1. Paroxysmal atrial fibrillation and flutter. Patient is minimally symptomatic. Now with evidence of CVA on MRI his  [place] vascular score is 2. He is anticoagulated with Eliquis. Continue  diltiazem daily.Avoid NSAIDs. 2. Prior CVA on MRI 3. HLD. Excellent response with Crestor. LDL 60. 4. Pre procedure planning for colonoscopy. He is clear from my standpoint. OK to hold Eliquis for 48-72 hours for procedure. Will forward my note to Dr Michail Sermon.    Follow up in 6 months.

## 2022-05-04 ENCOUNTER — Ambulatory Visit (INDEPENDENT_AMBULATORY_CARE_PROVIDER_SITE_OTHER): Payer: Medicare Other | Admitting: Family Medicine

## 2022-05-04 ENCOUNTER — Telehealth: Payer: Self-pay | Admitting: Cardiology

## 2022-05-04 DIAGNOSIS — Z23 Encounter for immunization: Secondary | ICD-10-CM

## 2022-05-04 NOTE — Telephone Encounter (Signed)
Pt has appt with his PCP today to get his Flu vaccins and will go ahead and keep the appt since I was unable to promise he could get it our office tomorrow. He verbalized understanding.

## 2022-05-04 NOTE — Telephone Encounter (Signed)
Pt is requesting call back to find out if he can get a flu vaccine at his f/u appt with Dr. [place] tomorrow. Please advise.

## 2022-05-04 NOTE — Progress Notes (Signed)
Pt received his Flu vaccine today . Gave in left deltoid and pt tolerated injection well .

## 2022-05-05 ENCOUNTER — Encounter: Payer: Self-pay | Admitting: Cardiology

## 2022-05-05 ENCOUNTER — Ambulatory Visit: Payer: Medicare Other | Attending: Cardiology | Admitting: Cardiology

## 2022-05-05 VITALS — BP 124/66 | HR 98 | Ht 73.0 in | Wt 167.2 lb

## 2022-05-05 DIAGNOSIS — I480 Paroxysmal atrial fibrillation: Secondary | ICD-10-CM | POA: Diagnosis not present

## 2022-05-08 ENCOUNTER — Telehealth: Payer: Self-pay | Admitting: Cardiology

## 2022-05-08 NOTE — Telephone Encounter (Signed)
Spoke to patient he stated Eliquis is too expensive.Stated he will not qualify for patient assistance.He may be in doughnut hole.He wants to keep taking.He will check for a better price.

## 2022-05-08 NOTE — Telephone Encounter (Signed)
Pt c/o medication issue:  1. Name of Medication: apixaban (ELIQUIS) 5 MG TABS tablet  2. How are you currently taking this medication (dosage and times per day)? Take 1 tablet (5 mg total) by mouth 2 (two) times daily. - Oral  3. Are you having a reaction (difficulty breathing--STAT)?   4. What is your medication issue? Pt calling stating that when he first got refill it was $100, now he states his refill is $300 and wants to  know if he can take something else for cheaper price

## 2022-05-24 ENCOUNTER — Encounter: Payer: Self-pay | Admitting: Psychology

## 2022-05-24 ENCOUNTER — Ambulatory Visit: Payer: Medicare Other

## 2022-05-24 ENCOUNTER — Ambulatory Visit: Payer: Medicare Other | Admitting: Psychology

## 2022-05-24 DIAGNOSIS — I679 Cerebrovascular disease, unspecified: Secondary | ICD-10-CM | POA: Insufficient documentation

## 2022-05-24 DIAGNOSIS — R4189 Other symptoms and signs involving cognitive functions and awareness: Secondary | ICD-10-CM

## 2022-05-24 DIAGNOSIS — K219 Gastro-esophageal reflux disease without esophagitis: Secondary | ICD-10-CM | POA: Insufficient documentation

## 2022-05-24 DIAGNOSIS — I6381 Other cerebral infarction due to occlusion or stenosis of small artery: Secondary | ICD-10-CM | POA: Diagnosis not present

## 2022-05-24 DIAGNOSIS — G3184 Mild cognitive impairment, so stated: Secondary | ICD-10-CM | POA: Insufficient documentation

## 2022-05-24 HISTORY — DX: Mild cognitive impairment of uncertain or unknown etiology: G31.84

## 2022-05-24 NOTE — Progress Notes (Signed)
   Psychometrician Note   Cognitive testing was administered to Nicholas West by Cruzita Lederer, B.S. (psychometrist) under the supervision of Dr. Christia Reading, Ph.D., licensed psychologist on 05/24/2022. Nicholas West did not appear overtly distressed by the testing session per behavioral observation or responses across self-report questionnaires. Rest breaks were offered.    The battery of tests administered was selected by Dr. Christia Reading, Ph.D. with consideration to Nicholas West current level of functioning, the nature of his symptoms, emotional and behavioral responses during interview, level of literacy, observed level of motivation/effort, and the nature of the referral question. This battery was communicated to the psychometrist. Communication between Dr. Christia Reading, Ph.D. and the psychometrist was ongoing throughout the evaluation and Dr. Christia Reading, Ph.D. was immediately accessible at all times. Dr. Christia Reading, Ph.D. provided supervision to the psychometrist on the date of this service to the extent necessary to assure the quality of all services provided.    Nicholas Patten. will return within approximately 1-2 weeks for an interactive feedback session with Dr. Melvyn Novas at which time his test performances, clinical impressions, and treatment recommendations will be reviewed in detail. Nicholas West understands he can contact our office should he require our assistance before this time.  A total of 140 minutes of billable time were spent face-to-face with Nicholas West by the psychometrist. This includes both test administration and scoring time. Billing for these services is reflected in the clinical report generated by Dr. Christia Reading, Ph.D.  This note reflects time spent with the psychometrician and does not include test scores or any clinical interpretations made by Dr. Melvyn Novas. The full report will follow in a separate note.

## 2022-05-24 NOTE — Progress Notes (Signed)
NEUROPSYCHOLOGICAL EVALUATION [place] [place] [facility] of Neurology  Date of Evaluation: May 24, 2022  Reason for Referral:   Nicholas West. is a 66 y.o. right-handed Caucasian male referred by Sharene Butters, PA-C, to characterize his current cognitive functioning and assist with diagnostic clarity and treatment planning in the context of subjective cognitive decline.   Assessment and Plan:   Clinical Impression(s): Nicholas West pattern of performance is suggestive of an isolated impairment surrounding verbal fluency (phonemic worse than semantic). Relative weaknesses were also exhibited across processing speed and, to a lesser extent, visuospatial abilities. Performances across other assessed cognitive domains were adequate. This includes attention/concentration, executive functioning, receptive language, confrontation naming, and learning and memory. Nicholas West denied difficulties completing instrumental activities of daily living (ADLs) independently. As such, given evidence for cognitive dysfunction described above, he meets diagnostic criteria for a Mild Neurocognitive Disorder ("mild cognitive impairment"). However, the mild nature of this diagnosis should be emphasized at the present time.   The etiology for ongoing dysfunction is unclear. There remains the possibility of a primary vascular etiology given his medical history (i.e., atrial fibrillation, hypercholesterolemia) and recent neuroimaging suggesting moderate microvascular ischemic disease and a prior lacunar infarct. Deficits in processing speed and verbal fluency are very common in primary vascular etiologies and would align reasonably well with this as the underlying cause. While visuospatial dysfunction is most commonly seen with illness or damage involving the parietal lobe, the location of his stroke in the occipital lobe may also be impacting these performances if there has been any  disruption with the front end of the visual processing cognitive network.   Admittedly, the extent of phonemic fluency impairment is surprising even when considering his cerebrovascular health. Despite this, I do not have compelling evidence to raise suspicion surrounding a non-fluent primary progressive aphasia presentation. Nicholas West had no difficulty communicating during interview or testing and he and his wife described no issues with articulation or word finding in his day-to-day life. Current symptoms and test scores do not align with semantic dementia or logopenic subtypes of this family of illness. I also see no compelling evidence for the behavioral variant of frontotemporal lobar degeneration, Lewy body disease, or another more rare parkinsonian condition presently.   Specific to memory, Nicholas West was able to learn information adequately and generally retained this information well after a delay (retention rates ranged from 85% to 140%). He also performed adequately across yes/no recognition trials. Taken together, this does not suggest patterns concerning for Alzheimer's disease at the present time. Continued medical monitoring will be important moving forward.   Recommendations: A repeat neuropsychological evaluation in 18-24 months (or sooner if functional decline is noted) is recommended to assess the trajectory of future cognitive decline should it occur. This will also aid in future efforts towards improved diagnostic clarity.  He could discuss a referral to a neuro-ophthalmologist with his PCP given the location of his stroke and potential for visual deficits. However, he did generally deny concerns or changes around the time this stroke was expected to have occurred.   Nicholas West is encouraged to attend to lifestyle factors for brain health (e.g., regular physical exercise, good nutrition habits, regular participation in cognitively-stimulating activities, and general stress management  techniques), which are likely to have benefits for both emotional adjustment and cognition. Optimal control of vascular risk factors (including safe cardiovascular exercise and adherence to dietary recommendations) is encouraged. Continued participation in activities which provide mental stimulation and  social interaction is also recommended.   If interested, there are some activities which have therapeutic value and can be useful in keeping him cognitively stimulated. For suggestions, Nicholas West is encouraged to go to the following website: https://www.[facility].org/get-to-know-barrow/centers-programs/neurorehabilitation-center/neuro-rehab-apps-and-games/ which has options, categorized by level of difficulty. It should be noted that these activities should not be viewed as a substitute for therapy.  When learning new information, he would benefit from information being broken up into small, manageable pieces. He may also find it helpful to articulate the material in his own words and in a context to promote encoding at the onset of a new task. This material may need to be repeated multiple times to promote encoding.  Memory can be improved using internal strategies such as rehearsal, repetition, chunking, mnemonics, association, and imagery. External strategies such as written notes in a consistently used memory journal, visual and nonverbal auditory cues such as a calendar on the refrigerator or appointments with alarm, such as on a cell phone, can also help maximize recall.    To address problems with processing speed, he may wish to consider:   -Ensuring that he is alerted when essential material or instructions are being presented   -Adjusting the speed at which new information is presented   -Allowing for more time in comprehending, processing, and responding in conversation  To address problems with fluctuating attention, he may wish to consider:   -Avoiding external distractions when needing  to concentrate   -Limiting exposure to fast paced environments with multiple sensory demands   -Writing down complicated information and using checklists   -Attempting and completing one task at a time (i.e., no multi-tasking)   -Verbalizing aloud each step of a task to maintain focus   -Reducing the amount of information considered at one time  Review of Records:   Nicholas West was seen by [facility] Sharene Butters, PA-C) on 09/16/2021 for an evaluation of memory loss. Briefly, during a recent Medicare exam, his wife reported that Nicholas West memory has seemed worse during the prior six months. Examples included him forgetting recently held conversations 15 minutes later and having increased navigational difficulties while driving. 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 Other ADLs were described as intact. He also has noticed right greater than left head bobbing and his wife reported occasional jerky movements of his left arm. His wife also reported some REM sleep behaviors at night, moving and tossing and possibly acting out. Performance on a brief cognitive screening instrument (MOCA) was 29/30. Ultimately, Nicholas West was referred for a comprehensive neuropsychological evaluation to characterize his cognitive abilities and to assist with diagnostic clarity and treatment planning.   He was seen by Ms. Wertman on 10/14/2021 for follow-up. Of note, a brain MRI performed on 10/10/2021 revealed a likely subacute infarct involving the left occipital pole. It also revealed moderate microvascular ischemic changes. A recent EEG was also said to be normal and not suggestive of epileptiform activity. Clinical symptoms were said to be stable.   He was seen again for follow-up on 12/16/2021. Neck MRA performed on 10/26/2021 did not suggest appreciable stenosis. Brain MRA on the same date did suggest moderate stenosis within the A2 segment of the  right anterior cerebral artery. Clinical symptoms were said to be stable.  Past Medical History:  Diagnosis Date   Abdominal pain    BCC (basal cell carcinoma) 02/05/2013   Recurrent, top right scalp, MOHs   Cerebrovascular  disease    Chest pain    negative stress echo in September 2013   Contusion of right elbow 10/11/2016   Diverticulitis    Dysplastic nevus    moderate, Left back no treatment   GERD (gastroesophageal reflux disease)    Hiatal hernia 08/02/2020   Hypercholesterolemia 04/03/2012   Laceration of hand without foreign body 10/11/2016   Lacunar infarction 10/10/2021   MRI - subtle area of subacute gyriform enhancement in the left occipital pole   Male hypogonadism 03/05/2012   Paroxysmal atrial fibrillation 10/24/2012    Past Surgical History:  Procedure Laterality Date   COLON SURGERY     PARTIAL COLECTOMY  11/11    Current Outpatient Medications:    apixaban (ELIQUIS) 5 MG TABS tablet, Take 1 tablet (5 mg total) by mouth 2 (two) times daily., Disp: 180 tablet, Rfl: 3   B-D 3CC LUER-LOK SYR 23GX1" 23G X 1" 3 ML MISC, SMARTSIG:1 Injection Once a Week, Disp: , Rfl:    B-D HYPODERMIC NEEDLE 18GX1.5" 18G X 1-1/2" MISC, USE TO DRAW UP MEDICATION, Disp: , Rfl:    calcium carbonate (OS-CAL - DOSED IN MG OF ELEMENTAL CALCIUM) 1250 MG tablet, Take 2 tablets by mouth 2 (two) times daily with a meal. , Disp: , Rfl:    cholecalciferol (VITAMIN D) 1000 UNITS tablet, Take 1,000 Units by mouth 2 (two) times daily., Disp: , Rfl:    diltiazem (CARDIZEM CD) 240 MG 24 hr capsule, TAKE 1 CAPSULE(240 MG) BY MOUTH DAILY, Disp: 90 capsule, Rfl: 3   Docusate Sodium (COLACE PO), Take 1 tablet by mouth as directed., Disp: , Rfl:    Multiple Vitamin (MULTIVITAMIN) tablet, Take 1 tablet by mouth daily., Disp: , Rfl:    Omega-3 Fatty Acids (OMEGA 3 PO), Take 2 capsules by mouth daily. , Disp: , Rfl:    omeprazole (PRILOSEC) 20 MG capsule, Take 1 capsule (20 mg total) by mouth daily., Disp: 30  capsule, Rfl: 11   Probiotic Product (PROBIOTIC-10 PO), Take 1 tablet by mouth daily., Disp: , Rfl:    rosuvastatin (CRESTOR) 10 MG tablet, Take 1 tablet (10 mg total) by mouth daily., Disp: 90 tablet, Rfl: 3   sildenafil (VIAGRA) 100 MG tablet, Take 100 mg by mouth as needed., Disp: , Rfl:    tacrolimus (PROTOPIC) 0.1 % ointment, Apply 1 application. topically daily., Disp: 100 g, Rfl: 2   testosterone cypionate (DEPOTESTOSTERONE CYPIONATE) 200 MG/ML injection, Inject into the muscle once a week. , Disp: , Rfl:   Clinical Interview:   The following information was obtained during a clinical interview with Mr. Krantz and his wife prior to cognitive testing.  Cognitive Symptoms: Decreased short-term memory: Endorsed. He described greater difficulties remembering tasks he needs to complete, especially if his typical routine gets disrupted. He generally downplayed memory concerns, stating that what he has trouble recalling is often "not critical." His wife noted greater difficulties recalling past conversations, repeating himself at times, and forgetting where familiar household objects are located. Difficulties were said to be present at least for the past 6-9 months and do seem to have progressively worsened over time.  Decreased long-term memory: Denied. Decreased attention/concentration: Denied. Reduced processing speed: Denied. Difficulties with executive functions: Denied. They also denied trouble with impulsivity or any significant personality changes.  Difficulties with emotion regulation: Denied. Difficulties with receptive language: Denied. Difficulties with word finding: Endorsed "at times." He also added that this was "not a serious problem."  Decreased visuoperceptual ability: Denied.  Difficulties  completing ADLs: Denied. While he reported a longstanding relative weakness surrounding directions, he did report feeling as though his navigational abilities had declined. He denied any  instances where he has gotten lost while driving.   Additional Medical History: History of traumatic brain injury/concussion: Denied. He did report a fall within the past year or so where he hit his head after mis-stepping on a flight of stairs while carrying several boxes. He did not report a loss in consciousness and reported not being formally diagnosed with a concussion. No other potential head injuries were reported. History of stroke: A brain MRI on March 2023 revealed a likely subacute small infarct involving the left occipital pole. Nicholas West was "shocked" to learn this, suggesting that this experience may have been asymptomatic.  History of seizure activity: Denied. History of known exposure to toxins: Denied. Symptoms of chronic pain: Denied. Experience of frequent headaches/migraines: Denied. Frequent instances of dizziness/vertigo: Denied.  Sensory changes: He uses contacts/reading glasses with benefit. He also utilizes hearing aids with benefit. Other sensory changes/difficulties (i.e., taste and smell) were denied.  Balance/coordination difficulties: Endorsed. However, his wife noted that balance seems to have improved over time rather than progressively worsened. Mr. Diaz did state that he is far more careful since the aforementioned fall and always holds onto hand rails for added safety. No other falls were reported and one side of the body was not said to be worse than the other.  Other motor difficulties: Endorsed. Symptoms were said to be intermittent and action-based (e.g., writing, holding something in his hand). No resting or postural tremors were reported.   Sleep History: Estimated hours obtained each night: 7-8 hours.  Difficulties falling asleep: Denied. Difficulties staying asleep: Denied. Feels rested and refreshed upon awakening: Endorsed.  History of snoring: Endorsed. Per his wife, snoring is "very little." History of waking up gasping for air:  Denied. Witnessed breath cessation while asleep: Denied.  History of vivid dreaming: Denied. Excessive movement while asleep: Denied. Despite this, there is report in previous medical records where his wife reported possible REM sleep behaviors and some acting of dream content. They both denied these experiences during the current interview.  Instances of acting out his dreams: Denied.  Psychiatric/Behavioral Health History: Depression: Denied. He described his current mood as "good" and denied any previous mental health concerns or diagnoses. Current or remote suicidal ideation, intent, or plan was denied.  Anxiety: Denied. Mania: Denied. Trauma History: Denied. Visual/auditory hallucinations: Denied. Delusional thoughts: Denied.  Tobacco: Denied. Alcohol: He denied current alcohol consumption as well as a history of problematic alcohol abuse or dependence.  Recreational drugs: Denied.  Family History: Problem Relation Age of Onset   Cancer Mother    Alzheimer's disease Father        onset in mid-69s   Colon polyps Father    GER disease Father    Dementia Sister        unspecified type; rapidly progressing   Heart disease Paternal Grandmother    Cancer Paternal Grandfather    This information was confirmed by Nicholas West.  Academic/Vocational History: Highest level of educational attainment: 19 years. He earned a Water quality scientist degree in Press [employer] and earned his law degree from [employer] He described himself as a Production designer, theatre/television/film in academic settings. No relative weaknesses were identified.  History of developmental delay: Denied. History of grade repetition: Denied. Enrollment in special education courses: Denied. History of LD/ADHD: Denied.  Employment: He currently works as a Chief of Staff, with  plans to retire at the end of 2023. He did not report any diminished work performance caused by cognitive decline. He also did not describe any colleagues  expressing concern.   Evaluation Results:   Behavioral Observations: Nicholas West was accompanied by his wife, arrived to his appointment on time, and was appropriately dressed and groomed. He appeared alert and oriented. Observed gait and station were within normal limits. Gross motor functioning appeared intact upon informal observation and no abnormal movements (e.g., tremors) were noted. His affect was generally relaxed and positive, but did range appropriately given the subject being discussed during the clinical interview. Spontaneous speech was fluent and word finding difficulties were not observed during the clinical interview. Thought processes were coherent, organized, and normal in content. Insight into his cognitive difficulties appeared largely adequate. However, he may not fully appreciate the extent of decline surrounding processing speed.   During testing, sustained attention was appropriate. Task engagement was adequate and he persisted when challenged. Overall, Nicholas West was cooperative with the clinical interview and subsequent testing procedures.   Adequacy of Effort: The validity of neuropsychological testing is limited by the extent to which the individual being tested may be assumed to have exerted adequate effort during testing. Nicholas West expressed his intention to perform to the best of his abilities and exhibited adequate task engagement and persistence. Scores across stand-alone and embedded performance validity measures were within expectation. As such, the results of the current evaluation are believed to be a valid representation of Nicholas West current cognitive functioning.  Test Results: Nicholas West was fully oriented at the time of the current evaluation.  Intellectual abilities based upon educational and vocational attainment were estimated to be in the above average range. Premorbid abilities were estimated to be within the above average range based upon a  single-word reading test.   Processing speed was variable, ranging from the well below average to average normative ranges. Basic attention was average. More complex attention (e.g., working memory) was above average. Executive functioning was average to exceptionally high.  While not directly assessed, receptive language abilities were believed to be intact. Likewise, Nicholas West did not exhibit any difficulties comprehending task instructions and answered all questions asked of him appropriately. Assessed expressive language was variable. Phonemic fluency was exceptionally low, semantic fluency was exceptionally low to below average, and confrontation naming was average.    Assessed visuospatial/visuoconstructional abilities were below average to average.    Learning (i.e., encoding) of novel verbal and visual information was below average to above average. Spontaneous delayed recall (i.e., retrieval) of previously learned information was average to above average. Retention rates were 85% across a story learning task, 117% across a list learning task, and 140% across a shape learning task. Performance across recognition tasks was below average to above average, suggesting evidence for information consolidation.   Results of emotional screening instruments suggested that recent symptoms of generalized anxiety were in the minimal range, while symptoms of depression were within normal limits. A screening instrument assessing recent sleep quality suggested the presence of minimal sleep dysfunction.  Tables of Scores:   Note: This summary of test scores accompanies the interpretive report and should not be considered in isolation without reference to the appropriate sections in the text. Descriptors are based on appropriate normative data and may be adjusted based on clinical judgment. Terms such as "Within Normal Limits" and "Outside Normal Limits" are used when a more specific description of the test  score cannot be determined.  Percentile - Normative Descriptor > 98 - Exceptionally High 91-97 - Well Above Average 75-90 - Above Average 25-74 - Average 9-24 - Below Average 2-8 - Well Below Average < 2 - Exceptionally Low       Orientation:      Raw Score Percentile   NAB Orientation, Form 1 29/29 --- ---       Cognitive Screening:      Raw Score Percentile   SLUMS: 25/30 --- ---       Intellectual Functioning:      Standard Score Percentile   Test of Premorbid Functioning: 110 75 Above Average       Memory:     NAB Memory Module, Form 1: Standard Score/ T Score Percentile   Total Memory Index 97 42 Average  List Learning       Total Trials 1-3 21/36 (45) 31 Average    List B 5/12 (54) 66 Average    Short Delay Free Recall 6/12 (43) 25 Average    Long Delay Free Recall 7/12 (49) 46 Average    Retention Percentage 117 (57) 75 Above Average    Recognition Discriminability 3 (37) 9 Below Average  Shape Learning       Total Trials 1-3 12/27 (39) 14 Below Average    Delayed Recall 7/9 (59) 82 Above Average    Retention Percentage 140 (63) 91 Well Above Average    Recognition Discriminability 5 (42) 21 Below Average  Story Learning       Immediate Recall 55/80 (43) 25 Average    Delayed Recall 29/40 (43) 25 Average    Retention Percentage 85 (48) 42 Average  Daily Living Memory       Immediate Recall 47/51 (60) 84 Above Average    Delayed Recall 16/17 (61) 86 Above Average    Retention Percentage 94 (55) 69 Average    Recognition Hits 10/10 (60) 84 Above Average       Attention/Executive Function:     Trail Making Test (TMT): Raw Score (T Score) Percentile     Part A 52 secs.,  0 errors (35) 7 Well Below Average    Part B 71 secs.,  0 errors (50) 50 Average         Scaled Score Percentile   WAIS-IV Coding: 7 16 Below Average       NAB Attention Module, Form 1: T Score Percentile     Digits Forward 53 62 Average    Digits Backwards 58 79 Above Average        D-KEFS Color-Word Interference Test: Raw Score (Scaled Score) Percentile     Color Naming 38 secs. (8) 25 Average    Word Reading 30 secs. (7) 16 Below Average    Inhibition 55 secs. (12) 75 Above Average      Total Errors 0 errors (12) 75 Above Average    Inhibition/Switching 61 secs. (12) 75 Above Average      Total Errors 1 error (12) 75 Above Average       D-KEFS Verbal Fluency Test: Raw Score (Scaled Score) Percentile     Letter Total Correct 10 (2) <1 Exceptionally Low    Category Total Correct 27 (7) 16 Below Average    Category Switching Total Correct 10 (7) 16 Below Average    Category Switching Accuracy 8 (7) 16 Below Average      Total Set Loss Errors 2 (10) 50 Average      Total Repetition Errors 0 (13) 84  Above Average       [employer] Test: Raw Score Percentile     Categories (trials) 4 (64) >16 Within Normal Limits    Total Errors 12 66 Average    Perseverative Errors 8 42 Average    Non-Perseverative Errors 4 >99 Exceptionally High    Failure to Maintain Set 0 --- ---       Language:     Verbal Fluency Test: Raw Score (T Score) Percentile     Phonemic Fluency (FAS) 10 (17) <1 Exceptionally Low    Animal Fluency 11 (26) 1 Exceptionally Low        NAB Language Module, Form 1: T Score Percentile     Naming 31/31 (55) 69 Average       Visuospatial/Visuoconstruction:      Raw Score Percentile   Clock Drawing: 9/10 --- Within Normal Limits  Line Bisection: --- --- Within Normal Limits       NAB Spatial Module, Form 1: T Score Percentile     Figure Drawing Copy 53 62 Average        Scaled Score Percentile   WAIS-IV Block Design: 8 25 Average  WAIS-IV Matrix Reasoning: 7 16 Below Average  WAIS-IV Visual Puzzles: 8 25 Average       Mood and Personality:      Raw Score Percentile   Beck Depression Inventory - II: 1 --- Within Normal Limits  PROMIS Anxiety Questionnaire: 8 --- None to Slight       Additional Questionnaires:      Raw Score  Percentile   PROMIS Sleep Disturbance Questionnaire: 8 --- None to Slight   Informed Consent and Coding/Compliance:   The current evaluation represents a clinical evaluation for the purposes previously outlined by the referral source and is in no way reflective of a forensic evaluation.   Mr. Dayhoff was provided with a verbal description of the nature and purpose of the present neuropsychological evaluation. Also reviewed were the foreseeable risks and/or discomforts and benefits of the procedure, limits of confidentiality, and mandatory reporting requirements of this provider. The patient was given the opportunity to ask questions and receive answers about the evaluation. Oral consent to participate was provided by the patient.   This evaluation was conducted by Christia Reading, Ph.D., ABPP-CN, board certified clinical neuropsychologist. Mr. Somers completed a clinical interview with Dr. Melvyn Novas, billed as one unit [phone], and 140 minutes of cognitive testing and scoring, billed as one unit [phone] and four additional units [id number]. Psychometrist Cruzita Lederer, B.S., assisted Dr. Melvyn Novas with test administration and scoring procedures. As a separate and discrete service, Dr. Melvyn Novas spent a total of 160 minutes in interpretation and report writing billed as one unit [phone] and two units [id number].

## 2022-05-31 ENCOUNTER — Ambulatory Visit: Payer: Medicare Other | Admitting: Psychology

## 2022-05-31 DIAGNOSIS — G3184 Mild cognitive impairment, so stated: Secondary | ICD-10-CM | POA: Diagnosis not present

## 2022-05-31 DIAGNOSIS — I6381 Other cerebral infarction due to occlusion or stenosis of small artery: Secondary | ICD-10-CM | POA: Diagnosis not present

## 2022-05-31 DIAGNOSIS — I679 Cerebrovascular disease, unspecified: Secondary | ICD-10-CM | POA: Diagnosis not present

## 2022-05-31 NOTE — Progress Notes (Signed)
   Neuropsychology Feedback Session [name]. [place] Department of Neurology  Reason for Referral:   Nicholas West. is a 66 y.o. right-handed Caucasian male referred by Sharene Butters, PA-C, to characterize his current cognitive functioning and assist with diagnostic clarity and treatment planning in the context of subjective cognitive decline.   Feedback:   [name] completed a comprehensive neuropsychological evaluation on 05/24/2022. Please refer to that encounter for the full report and recommendations. Briefly, results suggested an isolated impairment surrounding verbal fluency (phonemic worse than semantic). Relative weaknesses were also exhibited across processing speed and, to a lesser extent, visuospatial abilities. Performances across other assessed cognitive domains were adequate. The etiology for ongoing dysfunction is unclear. There remains the possibility of a primary vascular etiology given his medical history (i.e., atrial fibrillation, hypercholesterolemia) and recent neuroimaging suggesting moderate microvascular ischemic disease and a prior lacunar infarct. Deficits in processing speed and verbal fluency are very common in primary vascular etiologies and would align reasonably well with this as the underlying cause. While visuospatial dysfunction is most commonly seen with illness or damage involving the parietal lobe, the location of his stroke in the occipital lobe may also be impacting these performances if there has been any disruption with the front end of the visual processing cognitive network. Admittedly, the extent of phonemic fluency impairment is surprising even when considering his cerebrovascular health. Despite this, I do not have compelling evidence to raise suspicion surrounding a non-fluent primary progressive aphasia presentation. Memory performances do not suggest concerns for Alzheimer's disease at the present time.  [name] was  accompanied by his wife during the current feedback session. Content of the current session focused on the results of his neuropsychological evaluation. [name] was given the opportunity to ask questions and his questions were answered. He was encouraged to reach out should additional questions arise. A copy of his report was provided at the conclusion of the visit.      25 minutes were spent conducting the current feedback session with [name], billed as one unit [phone].

## 2022-06-05 ENCOUNTER — Other Ambulatory Visit (HOSPITAL_BASED_OUTPATIENT_CLINIC_OR_DEPARTMENT_OTHER): Payer: Self-pay

## 2022-06-05 MED ORDER — COMIRNATY 30 MCG/0.3ML IM SUSY
PREFILLED_SYRINGE | INTRAMUSCULAR | 0 refills | Status: DC
  Filled 2022-06-05: qty 0.3, 1d supply, fill #0

## 2022-06-07 ENCOUNTER — Ambulatory Visit (INDEPENDENT_AMBULATORY_CARE_PROVIDER_SITE_OTHER): Payer: Medicare Other | Admitting: Family Medicine

## 2022-06-07 ENCOUNTER — Encounter: Payer: Self-pay | Admitting: Family Medicine

## 2022-06-07 VITALS — BP 114/60 | HR 58 | Temp 98.7°F | Ht 73.0 in | Wt 166.8 lb

## 2022-06-07 DIAGNOSIS — Z8673 Personal history of transient ischemic attack (TIA), and cerebral infarction without residual deficits: Secondary | ICD-10-CM

## 2022-06-07 DIAGNOSIS — Z7185 Encounter for immunization safety counseling: Secondary | ICD-10-CM

## 2022-06-07 DIAGNOSIS — R413 Other amnesia: Secondary | ICD-10-CM | POA: Diagnosis not present

## 2022-06-07 DIAGNOSIS — I4891 Unspecified atrial fibrillation: Secondary | ICD-10-CM

## 2022-06-07 NOTE — Progress Notes (Signed)
Subjective:  Patient ID: Nicholas Patten., male    DOB: June 24, 1956  Age: 66 y.o. MRN: [id number]  CC:  Chief Complaint  Patient presents with   Fatigue    Pt states all is well    HPI Nicholas Patten. presents for  Memory loss/changes, afib/flutter ,hx of CVA.   -Discussed earlier this year.  Subacute cortical infarct noted on MRI of the brain.  Chronic ischemic disease.  Treated with Plavix, aspirin, then changed to Eliquis with history of A-fib.  He is followed by cardiology and neurology.  Off Plavix, aspirin at his May visit.  Denies any new memory changes or headaches at that time and felt like things were going well.  Was also started on Crestor in April.  Excellent response with LDL 60 per cardiology note October 6.  Clearance was given to hold Eliquis for 48 to 72 hours prior to colonoscopy. Fatigue also had resolved at his last visit in May, possible viral illness without residual symptoms at that time.  Previous blood work was reassuring. Neuro, Sharene Butters, PA-C.  Office visit 9, mild cognitive impairment, no indication for antidementia medication.  PT referral for strength and balance, 21-monthfollow-up planned - appt next week.  Has seen neuropsychology, most recent visit November 1, Dr. MMelvyn Novas  Isolated impairment surrounding verbal fluency, relative weakness with processing speed and visual-spatial abilities.  Other commented domains were adequate.  Did not have compelling evidence to raise suspicion for nonfluent primary progressive aphasia and memory performance did not suggest concerns for Alzheimer's at present time.  Still tolerating Eliquis - no new bleeding. No new weakness or HA.  No myalgias with statin.  Glucose has been normal.  Memory has been overall stable. No significant changes.  No new fatigue. Feels like energy is doing well, no mood changes. Eating and drinking well. Intentional weight loss with healthier eating and portions.   Wt Readings from Last 3  Encounters:  06/07/22 166 lb 12.8 oz (75.7 kg)  05/05/22 167 lb 3.2 oz (75.8 kg)  12/16/21 176 lb (79.8 kg)      BP Readings from Last 3 Encounters:  06/07/22 114/60  05/05/22 124/66  12/20/21 134/78     Lab Results  Component Value Date   ALT 37 02/10/2022   AST 34 02/10/2022   ALKPHOS 79 02/10/2022   BILITOT 0.8 02/10/2022    Lab Results  Component Value Date   CHOL 121 02/10/2022   HDL 48 02/10/2022   LDLCALC 60 02/10/2022   TRIG 58 02/10/2022   CHOLHDL 2.5 02/10/2022   Health Maintenance: Plan for colonoscopy with Dr. SMichail Sermonsoon Shingles vaccine - had at pharmacy - will get second at pharmacy.  Had covid booster few days ago.  Plans on RSV vaccine at his pharmacy.   History Patient Active Problem List   Diagnosis Date Noted   GERD (gastroesophageal reflux disease) 05/24/2022   Mild cognitive impairment of uncertain or unknown etiology 05/24/2022   Cerebrovascular disease    Lacunar infarction 09/2021   Hiatal hernia 08/02/2020   Paroxysmal atrial fibrillation ([place] 10/24/2012   Hypercholesterolemia 04/03/2012   Erectile dysfunction 03/05/2012   Male hypogonadism 03/05/2012   Past Medical History:  Diagnosis Date   Abdominal pain    BCC (basal cell carcinoma) 02/05/2013   Recurrent, top right scalp, MOHs   Cerebrovascular disease    Chest pain    negative stress echo in September 2013   Contusion of right elbow 10/11/2016  Diverticulitis    Dysplastic nevus    moderate, Left back no treatment   GERD (gastroesophageal reflux disease)    Hiatal hernia 08/02/2020   Hypercholesterolemia 04/03/2012   Laceration of hand without foreign body 10/11/2016   Lacunar infarction 10/10/2021   MRI - subtle area of subacute gyriform enhancement in the left occipital pole   Male hypogonadism 03/05/2012   Mild cognitive impairment of uncertain or unknown etiology 05/24/2022   Paroxysmal atrial fibrillation 10/24/2012   Past Surgical History:  Procedure  Laterality Date   COLON SURGERY     PARTIAL COLECTOMY  11/11   Allergies  Allergen Reactions   Penicillins     rash   Prior to Admission medications   Medication Sig Start Date End Date Taking? Authorizing Provider  apixaban (ELIQUIS) 5 MG TABS tablet Take 1 tablet (5 mg total) by mouth 2 (two) times daily. 11/03/21  Yes Martinique, Nicholas M, MD  B-D 3CC LUER-LOK SYR 23GX1" 23G X 1" 3 ML MISC SMARTSIG:1 Injection Once a Week 02/06/20  Yes [provider]  B-D HYPODERMIC NEEDLE 18GX1.5" 18G X 1-1/2" MISC USE TO DRAW UP MEDICATION 04/01/20  Yes [provider]  calcium carbonate (OS-CAL - DOSED IN MG OF ELEMENTAL CALCIUM) 1250 MG tablet Take 2 tablets by mouth 2 (two) times daily with a meal.    Yes [provider]  cholecalciferol (VITAMIN D) 1000 UNITS tablet Take 1,000 Units by mouth 2 (two) times daily.   Yes [provider]  diltiazem (CARDIZEM CD) 240 MG 24 hr capsule TAKE 1 CAPSULE(240 MG) BY MOUTH DAILY 08/08/21  Yes Martinique, Nicholas M, MD  Docusate Sodium (COLACE PO) Take 1 tablet by mouth as directed.   Yes [provider]  Multiple Vitamin (MULTIVITAMIN) tablet Take 1 tablet by mouth daily.   Yes [provider]  Omega-3 Fatty Acids (OMEGA 3 PO) Take 2 capsules by mouth daily.    Yes [provider]  omeprazole (PRILOSEC) 20 MG capsule Take 1 capsule (20 mg total) by mouth daily. 03/05/12  Yes Nicholas Russian, MD  Probiotic Product (PROBIOTIC-10 PO) Take 1 tablet by mouth daily.   Yes [provider]  sildenafil (VIAGRA) 100 MG tablet Take 100 mg by mouth as needed. 08/30/21  Yes [provider]  tacrolimus (PROTOPIC) 0.1 % ointment Apply 1 application. topically daily. 10/19/21  Yes Nicholas Monarch, MD  testosterone cypionate (DEPOTESTOSTERONE CYPIONATE) 200 MG/ML injection Inject into the muscle once a week.    Yes [provider]  COVID-19 mRNA vaccine [phone] (COMIRNATY) syringe Inject into the  muscle. Patient not taking: Reported on 06/07/2022 06/05/22   Nicholas Basques, MD  rosuvastatin (CRESTOR) 10 MG tablet Take 1 tablet (10 mg total) by mouth daily. Patient taking differently: Take 10 mg by mouth daily. Pt is taking 11/03/21 02/01/22  Martinique, Nicholas M, MD   Social History   Socioeconomic History   Marital status: Married    Spouse name: Not on file   Number of children: 0   Years of education: 24   Highest education level: Professional school degree (e.g., MD, DDS, DVM, JD)  Occupational History   Occupation: attorney    Employer: [employer]  Tobacco Use   Smoking status: Never   Smokeless tobacco: Never  Vaping Use   Vaping Use: Never used  Substance and Sexual Activity   Alcohol use: No   Drug use: No   Sexual activity: Yes  Other Topics Concern   Not  on file  Social History Narrative   Married   Exercise: Yes   Education: College   Right handed    Decaffeine    Two story home   Social Determinants of Health   Financial Resource Strain: Not on file  Food Insecurity: Not on file  Transportation Needs: Not on file  Physical Activity: Not on file  Stress: Not on file  Social Connections: Not on file  Intimate Partner Violence: Not on file    Review of Systems Per hpi  Objective:   Vitals:   06/07/22 0957  BP: 114/60  Pulse: (!) 58  Temp: 98.7 F (37.1 C)  SpO2: 98%  Weight: 166 lb 12.8 oz (75.7 kg)  Height: '6\' 1"'$  (1.854 West)    Physical Exam Vitals reviewed.  Constitutional:      Appearance: He is well-developed.  HENT:     Head: Normocephalic and atraumatic.  Neck:     Vascular: No carotid bruit or JVD.  Cardiovascular:     Rate and Rhythm: Normal rate and regular rhythm.     Heart sounds: Normal heart sounds. No murmur heard. Pulmonary:     Effort: Pulmonary effort is normal.     Breath sounds: Normal breath sounds. No rales.  Musculoskeletal:     Right lower leg: No edema.     Left lower leg: No edema.  Skin:    General:  Skin is warm and dry.  Neurological:     Mental Status: He is alert and oriented to person, place, and time.     Comments: Nonfocal.   Psychiatric:        Mood and Affect: Mood normal.        Behavior: Behavior normal.     Assessment & Plan:  Nicholas West. is a 66 y.o. male . History of CVA (cerebrovascular accident) Memory changes  -Status post evaluation with neuro, neuropsych as above.  Continue to work on secondary prevention with history of CVA, glucose has been stable.  LDL at goal.  Blood pressure stable.  Tolerating Eliquis for A-fib without new bleeding.  Continue follow-up as planned with specialists.  Atrial fibrillation, unspecified type ([place])  -As above, rate controlled, continue Eliquis.  Immunization counseling Vaccine recommendations reviewed as above.  No orders of the defined types were placed in this encounter.  Patient Instructions  Glad to hear you are doing well.  Keep follow up with specialists. Let me know if there are any concerns prior to 6 month follow up. Enjoy your time in [place].     Signed,   Nicholas Ray, MD [place], [facility] 06/07/22 10:44 AM

## 2022-06-07 NOTE — Patient Instructions (Signed)
Glad to hear you are doing well.  Keep follow up with specialists. Let me know if there are any concerns prior to 6 month follow up. Enjoy your time in [place].

## 2022-06-08 ENCOUNTER — Encounter: Payer: Self-pay | Admitting: Family Medicine

## 2022-06-13 DIAGNOSIS — D224 Melanocytic nevi of scalp and neck: Secondary | ICD-10-CM | POA: Diagnosis not present

## 2022-06-13 DIAGNOSIS — Z85828 Personal history of other malignant neoplasm of skin: Secondary | ICD-10-CM | POA: Diagnosis not present

## 2022-06-13 DIAGNOSIS — L718 Other rosacea: Secondary | ICD-10-CM | POA: Diagnosis not present

## 2022-06-13 DIAGNOSIS — C4441 Basal cell carcinoma of skin of scalp and neck: Secondary | ICD-10-CM | POA: Diagnosis not present

## 2022-06-13 DIAGNOSIS — D2261 Melanocytic nevi of right upper limb, including shoulder: Secondary | ICD-10-CM | POA: Diagnosis not present

## 2022-06-14 ENCOUNTER — Ambulatory Visit: Payer: Medicare Other | Admitting: Physician Assistant

## 2022-06-14 VITALS — BP 110/69 | HR 66 | Ht 73.0 in | Wt 166.2 lb

## 2022-06-14 DIAGNOSIS — G3184 Mild cognitive impairment, so stated: Secondary | ICD-10-CM

## 2022-06-14 NOTE — Patient Instructions (Addendum)
It was a pleasure to see you today at our office.   Recommendations:  Continue Eliquis daily Recommend good control of cardiovascular risk factors.    Follow up in  Jun 15 2023 at 11:30     RECOMMENDATIONS FOR ALL PATIENTS WITH MEMORY PROBLEMS: 1. Continue to exercise (Recommend 30 minutes of walking everyday, or 3 hours every week) 2. Increase social interactions - continue going to [place] and enjoy social gatherings with friends and family 3. Eat healthy, avoid fried foods and eat more fruits and vegetables 4. Maintain adequate blood pressure, blood sugar, and blood cholesterol level. Reducing the risk of stroke and cardiovascular disease also helps promoting better memory. 5. Avoid stressful situations. Live a simple life and avoid aggravations. Organize your time and prepare for the next day in anticipation. 6. Sleep well, avoid any interruptions of sleep and avoid any distractions in the bedroom that may interfere with adequate sleep quality 7. Avoid sugar, avoid sweets as there is a strong link between excessive sugar intake, diabetes, and cognitive impairment We discussed the Mediterranean diet, which has been shown to help patients reduce the risk of progressive memory disorders and reduces cardiovascular risk. This includes eating fish, eat fruits and green leafy vegetables, nuts like almonds and hazelnuts, walnuts, and also use olive oil. Avoid fast foods and fried foods as much as possible. Avoid sweets and sugar as sugar use has been linked to worsening of memory function.  There is always a concern of gradual progression of memory problems. If this is the case, then we may need to adjust level of care according to patient needs. Support, both to the patient and caregiver, should then be put into place.    FALL PRECAUTIONS: Be cautious when walking. Scan the area for obstacles that may increase the risk of trips and falls. When getting up in the mornings, sit up at the edge of the  bed for a few minutes before getting out of bed. Consider elevating the bed at the head end to avoid drop of blood pressure when getting up. Walk always in a well-lit room (use night lights in the walls). Avoid area rugs or power cords from appliances in the middle of the walkways. Use a walker or a cane if necessary and consider physical therapy for balance exercise. Get your eyesight checked regularly.  FINANCIAL OVERSIGHT: Supervision, especially oversight when making financial decisions or transactions is also recommended.  HOME SAFETY: Consider the safety of the kitchen when operating appliances like stoves, microwave oven, and blender. Consider having supervision and share cooking responsibilities until no longer able to participate in those. Accidents with firearms and other hazards in the house should be identified and addressed as well.   ABILITY TO BE LEFT ALONE: If patient is unable to contact 911 operator, consider using LifeLine, or when the need is there, arrange for someone to stay with patients. Smoking is a fire hazard, consider supervision or cessation. Risk of wandering should be assessed by caregiver and if detected at any point, supervision and safe proof recommendations should be instituted.  MEDICATION SUPERVISION: Inability to self-administer medication needs to be constantly addressed. Implement a mechanism to ensure safe administration of the medications.   DRIVING: Regarding driving, in patients with progressive memory problems, driving will be impaired. We advise to have someone else do the driving if trouble finding directions or if minor accidents are reported. Independent driving assessment is available to determine safety of driving.   If you are interested  in the driving assessment, you can contact the following:  [employer] in [place]  [place] [phone] or [phone]    [place] refers to food and lifestyle choices that are based on the traditions of countries located on the [employer]. This way of eating has been shown to help prevent certain conditions and improve outcomes for people who have chronic diseases, like kidney disease and heart disease. What are tips for following this plan? Lifestyle  Cook and eat meals together with your family, when possible. Drink enough fluid to keep your urine clear or pale yellow. Be physically active every day. This includes: Aerobic exercise like running or swimming. Leisure activities like gardening, walking, or housework. Get 7-8 hours of sleep each night. If recommended by your health care provider, drink red wine in moderation. This means 1 glass a day for nonpregnant women and 2 glasses a day for men. A glass of wine equals 5 oz (150 mL). Reading food labels  Check the serving size of packaged foods. For foods such as rice and pasta, the serving size refers to the amount of cooked product, not dry. Check the total fat in packaged foods. Avoid foods that have saturated fat or trans fats. Check the ingredients list for added sugars, such as corn syrup. Shopping  At the grocery store, buy most of your food from the areas near the walls of the store. This includes: Fresh fruits and vegetables (produce). Grains, beans, nuts, and seeds. Some of these may be available in unpackaged forms or large amounts (in bulk). Fresh seafood. Poultry and eggs. Low-fat dairy products. Buy whole ingredients instead of prepackaged foods. Buy fresh fruits and vegetables in-season from local farmers markets. Buy frozen fruits and vegetables in resealable bags. If you do not have access to quality fresh seafood, buy precooked frozen shrimp or canned fish, such as tuna, salmon, or sardines. Buy small amounts of raw or cooked vegetables, salads, or olives from  the deli or salad bar at your store. Stock your pantry so you always have certain foods on hand, such as olive oil, canned tuna, canned tomatoes, rice, pasta, and beans. Cooking  Cook foods with extra-virgin olive oil instead of using butter or other vegetable oils. Have meat as a side dish, and have vegetables or grains as your main dish. This means having meat in small portions or adding small amounts of meat to foods like pasta or stew. Use beans or vegetables instead of meat in common dishes like chili or lasagna. Experiment with different cooking methods. Try roasting or broiling vegetables instead of steaming or sauteing them. Add frozen vegetables to soups, stews, pasta, or rice. Add nuts or seeds for added healthy fat at each meal. You can add these to yogurt, salads, or vegetable dishes. Marinate fish or vegetables using olive oil, lemon juice, garlic, and fresh herbs. Meal planning  Plan to eat 1 vegetarian meal one day each week. Try to work up to 2 vegetarian meals, if possible. Eat seafood 2 or more times a week. Have healthy snacks readily available, such as: Vegetable sticks with hummus. Greek yogurt. Fruit and nut trail mix. Eat balanced meals throughout the week. This includes: Fruit: 2-3 servings a day Vegetables: 4-5 servings a day Low-fat dairy: 2 servings a day Fish, poultry, or lean meat: 1 serving a day Beans and legumes: 2 or more servings a week Nuts  and seeds: 1-2 servings a day Whole grains: 6-8 servings a day Extra-virgin olive oil: 3-4 servings a day Limit red meat and sweets to only a few servings a month What are my food choices? Mediterranean diet Recommended Grains: Whole-grain pasta. Brown rice. Bulgar wheat. Polenta. Couscous. Whole-wheat bread. [place]. Vegetables: Artichokes. Beets. Broccoli. Cabbage. Carrots. Eggplant. Green beans. Chard. Kale. Spinach. Onions. Leeks. Peas. Squash. Tomatoes. Peppers. Radishes. Fruits: Apples. Apricots.  Avocado. Berries. Bananas. Cherries. Dates. Figs. Grapes. Lemons. Melon. Oranges. Peaches. Plums. Pomegranate. Meats and other protein foods: Beans. Almonds. Sunflower seeds. Pine nuts. Peanuts. [place]. Salmon. Scallops. Shrimp. [place]. Tilapia. Clams. Oysters. Eggs. Dairy: Low-fat milk. Cheese. Greek yogurt. Beverages: Water. Red wine. Herbal tea. Fats and oils: Extra virgin olive oil. Avocado oil. Grape seed oil. Sweets and desserts: [place] yogurt with honey. Baked apples. Poached pears. Trail mix. Seasoning and other foods: Basil. Cilantro. Coriander. Cumin. Mint. Parsley. Sage. Rosemary. Tarragon. Garlic. Oregano. Thyme. Pepper. Balsalmic vinegar. Tahini. Hummus. Tomato sauce. Olives. Mushrooms. Limit these Grains: Prepackaged pasta or rice dishes. Prepackaged cereal with added sugar. Vegetables: Deep fried potatoes (french fries). Fruits: Fruit canned in syrup. Meats and other protein foods: Beef. Pork. Lamb. Poultry with skin. Hot dogs. Berniece Salines. Dairy: Ice cream. Sour cream. Whole milk. Beverages: Juice. Sugar-sweetened soft drinks. Beer. Liquor and spirits. Fats and oils: Butter. Canola oil. Vegetable oil. Beef fat (tallow). Lard. Sweets and desserts: Cookies. Cakes. Pies. Candy. Seasoning and other foods: Mayonnaise. Premade sauces and marinades. The items listed may not be a complete list. Talk with your dietitian about what dietary choices are right for you. Summary The Mediterranean diet includes both food and lifestyle choices. Eat a variety of fresh fruits and vegetables, beans, nuts, seeds, and whole grains. Limit the amount of red meat and sweets that you eat. Talk with your health care provider about whether it is safe for you to drink red wine in moderation. This means 1 glass a day for nonpregnant women and 2 glasses a day for men. A glass of wine equals 5 oz (150 mL). This information is not intended to replace advice given to you by your health care provider. Make sure you discuss  any questions you have with your health care provider. Document Released: 03/09/2016 Document Revised: 04/11/2016 Document Reviewed: 03/09/2016 Elsevier Interactive Patient Education  2017 [employer].  We have sent a referral to [facility] for your MRI and they will call you directly to schedule your appointment. They are located at [place]. If you need to contact them directly please call [phone].  Your provider has requested that you have labwork completed today. Please go to [facility] ([id number]) on the second floor of this building before leaving the office today. You do not need to check in. If you are not called within 15 minutes please check with the front desk.

## 2022-06-14 NOTE — Progress Notes (Signed)
Assessment/Plan:   Mild Cognitive Impairment , likely of vascular etiology versus other etiologies  Nicholas West. is a very pleasant 66 y.o. RH male presenting today in follow-up for evaluation of memory difficulties.  His last MoCA on 12/16/2021 was 29/30.  Personally reviewed the MRI of the brain was remarkable for moderate chronic small vessel disease.  In the left occipital pole, this possible subacute cortical infarct has been seen, however MR a of the head and neck was negative for any large vessel occlusion, but did show moderate stenosis in the right anterior cerebral artery for which he is on Eliquis and followed by cardiology.  Neuropsychological evaluation on 05/24/2022 yielded a diagnosis of mild Neurocognitive Disorder ("mild cognitive impairment"). However, the mild nature of this diagnosis should be emphasized at the present time.  There is no suspicion for Alzheimer's disease at this time.  No antidementia medication is indicated.  No cognitive decline has been noted during this visit.    Recommendations:   Follow up in 1 year  Repeat neuropsychological evaluation in 18 to 24 months for clarity of the diagnosis and disease progression No indication for antidementia medication at this time. Recommend good control of cardiovascular risk factors.  Continue Eliquis as per cardiology We will control as per PCP Continue physical therapy for strength and balance    Subjective:   This patient is accompanied in the office by his wife who supplements the history. Previous records as well as any outside records available were reviewed prior to todays visit.  His last MoCA was 29/30 on May 2023    Any changes in memory since last visit?He still has some issues with short-term memory, especially with multitasking and directions. If he has something to do and is interrupted by another thoughts he may not finish the task. Uses a calendar an list which helps.  He has not been doing  any crossword puzzles or word finding. repeats oneself?  Occasionally he asks the same question.  Disoriented when walking into a room?  Patient denies   Leaving objects in unusual places?  Patient denies   Ambulates  with difficulty?  No longer uses the cane.  He walks daily and uses the treadmill.  Recent falls?  Patient denies   Any head injuries?  Patient denies   History of seizures?   Patient denies   Wandering behavior?  Patient denies   Patient drives?  He admits that he is sense of direction is reduced, uses [employer] and his wife Any mood changes since last visit?  Patient denies   Any worsening depression?:  Patient denies   Hallucinations?  Patient denies   Paranoia?  Patient denies   Patient reports that sleeps well without vivid dreams, REM behavior or sleepwalking   History of sleep apnea?  Patient denies   Any hygiene concerns?  Patient denies   Independent of bathing and dressing?  Endorsed  Does the patient needs help with medications?  Wife places it in the pillbox but he is in charge of taking them Who is in charge of the finances?  They both do finances together Any changes in appetite?  He is on a sodium restricted diet, has lost in total about 20 pounds for health reasons he feels good Patient have trouble swallowing? Patient denies   Does the patient cook?  Patient denies   Any kitchen accidents such as leaving the stove on? Patient denies   Any headaches?  Patient denies   Double  vision? Patient denies   Any focal numbness or tingling?  Patient denies   Chronic back pain Patient denies   Unilateral weakness?  Patient denies   Any tremors?  Patient denies   Any history of anosmia?  Patient denies   Any incontinence of urine?  Patient denies   Any bowel dysfunction?   Patient denies      Patient lives  with wife  Neuropsychological evaluation on 05/24/2022, Dr. Melvyn Novas: Briefly, results suggested an isolated impairment surrounding verbal fluency (phonemic worse than  semantic). Relative weaknesses were also exhibited across processing speed and, to a lesser extent, visuospatial abilities. Performances across other assessed cognitive domains were adequate. The etiology for ongoing dysfunction is unclear. There remains the possibility of a primary vascular etiology given his medical history (i.e., atrial fibrillation, hypercholesterolemia) and recent neuroimaging suggesting moderate microvascular ischemic disease and a prior lacunar infarct. Deficits in processing speed and verbal fluency are very common in primary vascular etiologies and would align reasonably well with this as the underlying cause. While visuospatial dysfunction is most commonly seen with illness or damage involving the parietal lobe, the location of his stroke in the occipital lobe may also be impacting these performances if there has been any disruption with the front end of the visual processing cognitive network. Admittedly, the extent of phonemic fluency impairment is surprising even when considering his cerebrovascular health. Despite this, I do not have compelling evidence to raise suspicion surrounding a non-fluent primary progressive aphasia presentation. Memory performances do not suggest concerns for Alzheimer's disease at the present time.         Initial Visit 09/16/21 The patient is seen in neurologic consultation at the request of Wendie Agreste, MD for the evaluation of memory.  The patient is accompanied by his wife  who supplements the history. 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  She states that he cannot remember recent conversations, and within 15 minutes he will ask again.  His short-term memory is worse than the long-term memory.  He denies anyone noticing at work that he has any cognitive difficulties.  She states that he used to be able  to drive without difficulties, and now, while driving, he is distracted, and at times will pass the turn.  He cannot multitask with directions.  Despite having a calendar, first in the morning his wife has to put a note in the mirror in the bathroom otherwise he will forget.  She states that he repeats himself more frequently. He denies being disoriented when walking into a room.  He denies leaving objects in unusual places.  He ambulates without difficulty,  He has some unsteadiness on his feet, especially when taking steps up to the kitchen from the garage.  He has to hold the railing otherwise he would fall.  This has been present for several months, during the falls, he denies any loss of consciousness, "I only hurt to my pride " he says.  In July, during a family vacation, he states that his feet went unsteady, but then again "the sand was too soft ".  He does report for many years leaning to the left.  He denies any prior history of TIA or stroke.  He is unsure if the blood pressure medications may be contributing to the sense dizziness.  He denies any lack of sensations  on his legs or arms.  He also has noticed that for several years he had some right greater than left head bobbing, and his wife reports that sometimes he has some left jerky movements of his arm.  He denies any history of seizures or Parkinsonism.  His mood is good, denies depression or irritability.  His wife states that he has significant REM behavior at night, moving and tossing, possibly acting out.  He does not know if he sleeps well.  He denies remembering any vivid dreams or sleepwalking.  He denies hallucinations or paranoia. History of 2 falls previously, no recent falls.  Reports sensation of his legs not cooperating and losing balance. Had to catch self once since last visit. No pain, just unsteady.  His appetite is good, he eats 3 meals a day, drinking plenty fluids during the day .  He is active, he does occasionally fast to  walk during the week.  Denies any hygiene concerns, he is independent of bathing and dressing.  His wife puts the medications out in a pillbox for him while he is in the shower.  They do the finances together, he reports no issues.  He was concerned recently, because at work, he could not remember the password in his computer, needing to get it out of his own personal computer.  He denies any headaches, he had some episodes of double vision, last one recently went back in to go to the mountains, when he "got hot and tired ".  He denies any blurred vision.  He denies any dizziness, focal numbness or tingling, or anosmia.  No history of seizures.  He denies any tremors.  He denies urine incontinence, retention, constipation or diarrhea.  He denies a history of sleep apnea, alcohol, tobacco. He reports a history of melanocytic nevus s.p large excision of the scal on the left. He also had noticed a recent rash on the forehead. He has an appt with the dermatologist soon. His sister died with the advanced dementia at 10, father with Alzheimer's disease at 74.    MRI brain without contrast 10/10/21 Moderate for age cerebral white matter signal changes are nonspecific but most commonly due to chronic small vessel disease. And a subtle area of gyriform enhancement in the left occipital pole is most suggestive of a subacute cortical infarct. No associated edema or mass effect. No other acute intracranial abnormality.   MRA 10/26/20 No intracranial large vessel occlusion.Apparent moderate stenosis within the A2 segment of the right anterior cerebral artery.   Labs 09/16/2021 with normal CMP, CBC, sed rate, B12, TSH, ammonia, RPR, HIV, ANA.    Past Medical History:  Diagnosis Date   Abdominal pain    BCC (basal cell carcinoma) 02/05/2013   Recurrent, top right scalp, MOHs   Cerebrovascular disease    Chest pain    negative stress echo in September 2013   Contusion of right elbow 10/11/2016   Diverticulitis     Dysplastic nevus    moderate, Left back no treatment   GERD (gastroesophageal reflux disease)    Hiatal hernia 08/02/2020   Hypercholesterolemia 04/03/2012   Laceration of hand without foreign body 10/11/2016   Lacunar infarction 10/10/2021   MRI - subtle area of subacute gyriform enhancement in the left occipital pole   Male hypogonadism 03/05/2012   Mild cognitive impairment of uncertain or unknown etiology 05/24/2022   Paroxysmal atrial fibrillation 10/24/2012     Past Surgical History:  Procedure Laterality Date   COLON SURGERY  PARTIAL COLECTOMY  11/11     PREVIOUS MEDICATIONS:   CURRENT MEDICATIONS:  Outpatient Encounter Medications as of 06/14/2022  Medication Sig   apixaban (ELIQUIS) 5 MG TABS tablet Take 1 tablet (5 mg total) by mouth 2 (two) times daily.   B-D 3CC LUER-LOK SYR 23GX1" 23G X 1" 3 ML MISC SMARTSIG:1 Injection Once a Week   B-D HYPODERMIC NEEDLE 18GX1.5" 18G X 1-1/2" MISC USE TO DRAW UP MEDICATION   calcium carbonate (OS-CAL - DOSED IN MG OF ELEMENTAL CALCIUM) 1250 MG tablet Take 2 tablets by mouth 2 (two) times daily with a meal.    cholecalciferol (VITAMIN D) 1000 UNITS tablet Take 1,000 Units by mouth 2 (two) times daily.   diltiazem (CARDIZEM CD) 240 MG 24 hr capsule TAKE 1 CAPSULE(240 MG) BY MOUTH DAILY   Docusate Sodium (COLACE PO) Take 1 tablet by mouth as directed.   metroNIDAZOLE (METROCREAM) 0.75 % cream Apply 1 Application topically 2 (two) times daily. Hasn't started yet.   Multiple Vitamin (MULTIVITAMIN) tablet Take 1 tablet by mouth daily.   Omega-3 Fatty Acids (OMEGA 3 PO) Take 2 capsules by mouth daily.    omeprazole (PRILOSEC) 20 MG capsule Take 1 capsule (20 mg total) by mouth daily.   Probiotic Product (PROBIOTIC-10 PO) Take 1 tablet by mouth daily.   rosuvastatin (CRESTOR) 10 MG tablet Take 1 tablet (10 mg total) by mouth daily. (Patient taking differently: Take 10 mg by mouth daily. Pt is taking)   sildenafil (VIAGRA) 100 MG tablet  Take 100 mg by mouth as needed.   testosterone cypionate (DEPOTESTOSTERONE CYPIONATE) 200 MG/ML injection Inject into the muscle once a week.    [DISCONTINUED] COVID-19 mRNA vaccine 2023-2024 (COMIRNATY) syringe Inject into the muscle.   [DISCONTINUED] tacrolimus (PROTOPIC) 0.1 % ointment Apply 1 application. topically daily.   No facility-administered encounter medications on file as of 06/14/2022.     Objective:     PHYSICAL EXAMINATION:    VITALS:   Vitals:   06/14/22 0934  BP: 110/69  Pulse: 66  SpO2: 99%  Weight: 166 lb 3.2 oz (75.4 kg)  Height: '6\' 1"'$  (1.854 m)    GEN:  The patient appears stated age and is in NAD. HEENT:  Normocephalic, atraumatic.   Neurological examination:  General: NAD, well-groomed, appears stated age. Orientation: The patient is alert. Oriented to person, place and date Cranial nerves: There is good facial symmetry.The speech is fluent and clear. No aphasia or dysarthria. Fund of knowledge is appropriate. Recent memory impaired and remote memory is normal.  Attention and concentration are normal.  Able to name objects and repeat phrases.  Hearing is intact to conversational tone.    Sensation: Sensation is intact to light touch throughout Motor: Strength is at least antigravity x4. Tremors: none  DTR's 2/4 in UE/LE      09/18/2021    8:00 PM  Montreal Cognitive Assessment   Visuospatial/ Executive (0/5) 4  Naming (0/3) 3  Attention: Read list of digits (0/2) 2  Attention: Read list of letters (0/1) 1  Attention: Serial 7 subtraction starting at 100 (0/3) 3  Language: Repeat phrase (0/2) 2  Language : Fluency (0/1) 1  Abstraction (0/2) 2  Delayed Recall (0/5) 5  Orientation (0/6) 6  Total 29  Adjusted Score (based on education) 29        No data to display             Movement examination: Tone: There is normal tone in the  UE/LE Abnormal movements:  no tremor.  No myoclonus.  No asterixis.   Coordination:  There is no  decremation with RAM's. Normal finger to nose  Gait and Station: The patient has no difficulty arising out of a deep-seated chair without the use of the hands. The patient's stride length is good.  Gait is cautious and narrow.   Thank you for allowing [place] the opportunity to participate in the care of this nice patient. Please do not hesitate to contact us for any questions or concerns.   Total time spent on today's visit was 36 minutes dedicated to this patient today, preparing to see patient, examining the patient, ordering tests and/or medications and counseling the patient, documenting clinical information in the EHR or other health record, independently interpreting results and communicating results to the patient/family, discussing treatment and goals, answering patient's questions and coordinating care.  Cc:  Wendie Agreste, MD  Sharene Butters 06/14/2022 10:28 AM

## 2022-06-19 ENCOUNTER — Other Ambulatory Visit (HOSPITAL_BASED_OUTPATIENT_CLINIC_OR_DEPARTMENT_OTHER): Payer: Self-pay

## 2022-06-19 DIAGNOSIS — R948 Abnormal results of function studies of other organs and systems: Secondary | ICD-10-CM | POA: Diagnosis not present

## 2022-06-19 DIAGNOSIS — E291 Testicular hypofunction: Secondary | ICD-10-CM | POA: Diagnosis not present

## 2022-06-19 MED ORDER — AREXVY 120 MCG/0.5ML IM SUSR
INTRAMUSCULAR | 0 refills | Status: DC
  Filled 2022-06-19: qty 0.5, 1d supply, fill #0

## 2022-06-21 DIAGNOSIS — H6123 Impacted cerumen, bilateral: Secondary | ICD-10-CM | POA: Diagnosis not present

## 2022-06-26 DIAGNOSIS — E291 Testicular hypofunction: Secondary | ICD-10-CM | POA: Diagnosis not present

## 2022-06-26 DIAGNOSIS — N5201 Erectile dysfunction due to arterial insufficiency: Secondary | ICD-10-CM | POA: Diagnosis not present

## 2022-06-26 DIAGNOSIS — N400 Enlarged prostate without lower urinary tract symptoms: Secondary | ICD-10-CM | POA: Diagnosis not present

## 2022-07-03 ENCOUNTER — Telehealth: Payer: Self-pay | Admitting: Family Medicine

## 2022-07-03 NOTE — Telephone Encounter (Signed)
Pt called and wanted to update his immunizations record. He received his second shingles sot on November 22 2019 at [facility] and wanted to know the best way to update this information. Please give patient a call if he needs to drop off the form or upload it on mychart.

## 2022-07-03 NOTE — Telephone Encounter (Signed)
Pt notes will send [place] a printed copy of his records

## 2022-07-12 DIAGNOSIS — K644 Residual hemorrhoidal skin tags: Secondary | ICD-10-CM | POA: Diagnosis not present

## 2022-07-12 DIAGNOSIS — K648 Other hemorrhoids: Secondary | ICD-10-CM | POA: Diagnosis not present

## 2022-07-12 DIAGNOSIS — Z86010 Personal history of colonic polyps: Secondary | ICD-10-CM | POA: Diagnosis not present

## 2022-07-12 DIAGNOSIS — Z09 Encounter for follow-up examination after completed treatment for conditions other than malignant neoplasm: Secondary | ICD-10-CM | POA: Diagnosis not present

## 2022-07-12 DIAGNOSIS — K5730 Diverticulosis of large intestine without perforation or abscess without bleeding: Secondary | ICD-10-CM | POA: Diagnosis not present

## 2022-07-12 DIAGNOSIS — Z980 Intestinal bypass and anastomosis status: Secondary | ICD-10-CM | POA: Diagnosis not present

## 2022-07-12 LAB — HM COLONOSCOPY

## 2022-08-02 DIAGNOSIS — C44319 Basal cell carcinoma of skin of other parts of face: Secondary | ICD-10-CM | POA: Diagnosis not present

## 2022-08-07 ENCOUNTER — Encounter: Payer: Self-pay | Admitting: Family Medicine

## 2022-08-07 ENCOUNTER — Ambulatory Visit (INDEPENDENT_AMBULATORY_CARE_PROVIDER_SITE_OTHER): Payer: Medicare Other | Admitting: Family Medicine

## 2022-08-07 VITALS — BP 122/70 | HR 60 | Temp 98.2°F | Ht 72.0 in | Wt 164.4 lb

## 2022-08-07 DIAGNOSIS — Z0000 Encounter for general adult medical examination without abnormal findings: Secondary | ICD-10-CM | POA: Diagnosis not present

## 2022-08-07 DIAGNOSIS — G3184 Mild cognitive impairment, so stated: Secondary | ICD-10-CM | POA: Diagnosis not present

## 2022-08-07 DIAGNOSIS — Z8673 Personal history of transient ischemic attack (TIA), and cerebral infarction without residual deficits: Secondary | ICD-10-CM

## 2022-08-07 DIAGNOSIS — Z7901 Long term (current) use of anticoagulants: Secondary | ICD-10-CM

## 2022-08-07 DIAGNOSIS — I4891 Unspecified atrial fibrillation: Secondary | ICD-10-CM

## 2022-08-07 LAB — CBC
HCT: 50.7 % (ref 39.0–52.0)
Hemoglobin: 17.3 g/dL — ABNORMAL HIGH (ref 13.0–17.0)
MCHC: 34.1 g/dL (ref 30.0–36.0)
MCV: 90.3 fl (ref 78.0–100.0)
Platelets: 167 10*3/uL (ref 150.0–400.0)
RBC: 5.61 Mil/uL (ref 4.22–5.81)
RDW: 13 % (ref 11.5–15.5)
WBC: 5.3 10*3/uL (ref 4.0–10.5)

## 2022-08-07 LAB — COMPREHENSIVE METABOLIC PANEL
ALT: 32 U/L (ref 0–53)
AST: 34 U/L (ref 0–37)
Albumin: 4.5 g/dL (ref 3.5–5.2)
Alkaline Phosphatase: 53 U/L (ref 39–117)
BUN: 13 mg/dL (ref 6–23)
CO2: 31 mEq/L (ref 19–32)
Calcium: 9.6 mg/dL (ref 8.4–10.5)
Chloride: 103 mEq/L (ref 96–112)
Creatinine, Ser: 0.88 mg/dL (ref 0.40–1.50)
GFR: 88.63 mL/min (ref >60.00)
Glucose, Bld: 83 mg/dL (ref 70–99)
Potassium: 4 mEq/L (ref 3.5–5.1)
Sodium: 141 mEq/L (ref 135–145)
Total Bilirubin: 0.9 mg/dL (ref 0.2–1.2)
Total Protein: 7.3 g/dL (ref 6.0–8.3)

## 2022-08-07 LAB — LIPID PANEL
Cholesterol: 112 mg/dL (ref 0–200)
HDL: 48.2 mg/dL (ref >39.00)
LDL Cholesterol: 52 mg/dL (ref 0–99)
NonHDL: 64
Total CHOL/HDL Ratio: 2
Triglycerides: 61 mg/dL (ref 0.0–149.0)
VLDL: 12.2 mg/dL (ref 0.0–40.0)

## 2022-08-07 NOTE — Progress Notes (Signed)
Subjective:  Patient ID: Nicholas West., male    DOB: 05-30-1956  Age: 67 y.o. MRN: [id number]  CC:  Chief Complaint  Patient presents with   Annual Exam    HPI [name]. presents for Annual Exam  No change in health since last visit.  Trip over Christmas at [facility] with family.   History of CVA with memory changes, mild cognitive impairment.  MoCA 29 out of 30 in May 2023. Discussed in November.  subacute cortical infarct noted on previous MRI of brain, chronic ischemic disease, treated with Plavix, aspirin then changed to Eliquis with history of A-fib.  By cardiology and neurology.  Mild cognitive impairment per neuro.  No indication for dementia medication.  Was referred to PT for strength and balance, also saw neuropsychology in October 2023, diagnosis of mild neurocognitive disorder with isolated impairment surrounding verbal fluency, relative weakness with processing speed and visual-spatial abilities.  Most recent visit with Sherrilyn Rist on 06/14/2022.  Plan for continued control of cardiovascular risk factors and follow-up visit in November. No recent changes in memory.   Cardiac Cardiologist Dr. Martinique, treated for paroxysmal atrial fibrillation and flutter., last visit in October.  Continued on Eliquis for anticoagulation, diltiazem for rate control, avoid NSAIDs.  Excellent response with Crestor, and option to hold Eliquis for 48 to 72 hours for colonoscopy.  62-monthfollow-up planned. No new bleeding. No typical feeling of afib or palpitations.  No CP/dyspnea.      08/07/2022    8:02 AM 06/07/2022    9:55 AM 11/25/2021    8:51 AM 09/07/2021    8:57 AM 08/04/2021    8:08 AM  Depression screen PHQ 2/9  Decreased Interest 0 0 0 0 0  Down, Depressed, Hopeless 0 0 0 0 0  PHQ - 2 Score 0 0 0 0 0  Altered sleeping 0 0 0 0   Tired, decreased energy 0 0 1 0   Change in appetite 0 0 0 0   Feeling bad or failure about yourself  0 0 0 0   Trouble concentrating 0 0 0  0   Moving slowly or fidgety/restless 0 0 0 0   Suicidal thoughts 0 0 0 0   PHQ-9 Score 0 0 1 0   Difficult doing work/chores Not difficult at all  Not difficult at all      Health Maintenance  Topic Date Due   Medicare Annual Wellness (AWV)  Never done   COVID-19 Vaccine (7 - 2023-24 season) 07/31/2022   COLONOSCOPY (Pts 45-426yrInsurance coverage will need to be confirmed)  11/26/2022 (Originally 10/01/2021)   Pneumonia Vaccine 6553Years old  Completed   INFLUENZA VACCINE  Completed   Hepatitis C Screening  Completed   Zoster Vaccines- Shingrix  Completed   HPV VACCINES  Aged Out   DTaP/Tdap/Td  Discontinued  Colonoscopy with Dr. ScMichail Sermonpast few weeks. plan for repeat in 5 years.  Prostate: followed by urology - Dr. DaDiona Fantior ED, hypogonadism, on testosterone, viagra as needed. PSA 1.49 on 11/20.   Dermatology - Dr.Goodrich, recent bx, MOHS to forehead.  Immunization History  Administered Date(s) Administered   COVID-19, mRNA, vaccine(Comirnaty)12 years and older 06/05/2022   Influenza Split 04/30/2012, 05/26/2016   Influenza,inj,Quad PF,6+ Mos 04/22/2013, 06/01/2014, 05/06/2015, 05/25/2017, 05/25/2018, 04/28/2019, 05/04/2022   Influenza-Unspecified 07/16/2020   PFIZER Comirnaty(Gray Top)Covid-19 Tri-Sucrose Vaccine 12/03/2020   PFIZER(Purple Top)SARS-COV-2 Vaccination 09/06/2019, 10/01/2019, 05/15/2020   Pfizer Covid-19 Vaccine Bivalent Booster 1216yr  up 05/18/2021, 06/05/2022   Pneumococcal Conjugate-13 08/02/2020   Pneumococcal Polysaccharide-23 08/04/2021   Respiratory Syncytial Virus Vaccine,Recomb Aduvanted(Arexvy) 06/19/2022   Tdap 04/21/2008   Zoster Recombinat (Shingrix) 06/27/2019, 11/22/2019   Zoster, Live 07/31/2014, 03/20/2015  Second Shingrix 11/22/2019 at [facility]: Covid booster:In November  No results found. Optho - no recent visit, yearly appt.   Saw ENT few months ago, Dr. Benjamine Mola, hearing aids, cerumen impaction.   Dental: natural teeth, dental  visit in past month, visits 4 times per year.   Alcohol:none  Tobacco: none.   Exercise: more disciplined, walking 1 hour daily in warmer months, now on treadmill - 1 hour daily. Feels well with exercise.   Fasting today.   History Patient Active Problem List   Diagnosis Date Noted   GERD (gastroesophageal reflux disease) 05/24/2022   Mild cognitive impairment of uncertain or unknown etiology 05/24/2022   Cerebrovascular disease    Lacunar infarction 09/2021   Hiatal hernia 08/02/2020   Paroxysmal atrial fibrillation ([place]) 10/24/2012   Hypercholesterolemia 04/03/2012   Erectile dysfunction 03/05/2012   Male hypogonadism 03/05/2012   Past Medical History:  Diagnosis Date   Abdominal pain    BCC (basal cell carcinoma) 02/05/2013   Recurrent, top right scalp, MOHs   Cerebrovascular disease    Chest pain    negative stress echo in September 2013   Contusion of right elbow 10/11/2016   Diverticulitis    Dysplastic nevus    moderate, Left back no treatment   GERD (gastroesophageal reflux disease)    Hiatal hernia 08/02/2020   Hypercholesterolemia 04/03/2012   Laceration of hand without foreign body 10/11/2016   Lacunar infarction 10/10/2021   MRI - subtle area of subacute gyriform enhancement in the left occipital pole   Male hypogonadism 03/05/2012   Mild cognitive impairment of uncertain or unknown etiology 05/24/2022   Paroxysmal atrial fibrillation 10/24/2012   Past Surgical History:  Procedure Laterality Date   COLON SURGERY     PARTIAL COLECTOMY  11/11   Allergies  Allergen Reactions   Penicillins     rash   Prior to Admission medications   Medication Sig Start Date End Date Taking? Authorizing Provider  apixaban (ELIQUIS) 5 MG TABS tablet Take 1 tablet (5 mg total) by mouth 2 (two) times daily. 11/03/21  Yes Martinique, Peter M, MD  B-D 3CC LUER-LOK SYR 23GX1" 23G X 1" 3 ML MISC SMARTSIG:1 Injection Once a Week 02/06/20  Yes [provider]  B-D HYPODERMIC  NEEDLE 18GX1.5" 18G X 1-1/2" MISC USE TO DRAW UP MEDICATION 04/01/20  Yes [provider]  calcium carbonate (OS-CAL - DOSED IN MG OF ELEMENTAL CALCIUM) 1250 MG tablet Take 2 tablets by mouth 2 (two) times daily with a meal.    Yes [provider]  cholecalciferol (VITAMIN D) 1000 UNITS tablet Take 1,000 Units by mouth 2 (two) times daily.   Yes [provider]  diltiazem (CARDIZEM CD) 240 MG 24 hr capsule TAKE 1 CAPSULE(240 MG) BY MOUTH DAILY 08/08/21  Yes Martinique, Peter M, MD  Docusate Sodium (COLACE PO) Take 1 tablet by mouth as directed.   Yes [provider]  metroNIDAZOLE (METROCREAM) 0.75 % cream Apply 1 Application topically 2 (two) times daily. Hasn't started yet. 06/13/22  Yes [provider]  Multiple Vitamin (MULTIVITAMIN) tablet Take 1 tablet by mouth daily.   Yes [provider]  Omega-3 Fatty Acids (OMEGA 3 PO) Take 2 capsules by mouth daily.    Yes [provider]  omeprazole (PRILOSEC) 20 MG capsule Take 1 capsule (20 mg total) by mouth daily. 03/05/12  Yes [name], MD  Probiotic Product (PROBIOTIC-10 PO) Take 1 tablet by mouth daily.   Yes [provider]  sildenafil (VIAGRA) 100 MG tablet Take 100 mg by mouth as needed. 08/30/21  Yes [provider]  testosterone cypionate (DEPOTESTOSTERONE CYPIONATE) 200 MG/ML injection Inject into the muscle once a week.    Yes [provider]  rosuvastatin (CRESTOR) 10 MG tablet Take 1 tablet (10 mg total) by mouth daily. Patient taking differently: Take 10 mg by mouth daily. Pt is taking 11/03/21 06/14/22  Martinique, Peter M, MD   Social History   Socioeconomic History   Marital status: Married    Spouse name: Not on file   Number of children: 0   Years of education: 67   Highest education level: Professional school degree (e.g., MD, DDS, DVM, JD)  Occupational History   Occupation: attorney    Employer: [employer]  Tobacco Use   Smoking status:  Never   Smokeless tobacco: Never  Vaping Use   Vaping Use: Never used  Substance and Sexual Activity   Alcohol use: No   Drug use: No   Sexual activity: Yes  Other Topics Concern   Not on file  Social History Narrative   Married   Exercise: Yes   Education: College   Right handed    Decaffeine    Two story home   Social Determinants of Health   Financial Resource Strain: Not on file  Food Insecurity: Not on file  Transportation Needs: Not on file  Physical Activity: Not on file  Stress: Not on file  Social Connections: Not on file  Intimate Partner Violence: Not on file    Review of Systems 13 point review of systems per patient health survey noted.  Negative other than as indicated above or in HPI.    Objective:   Vitals:   08/07/22 0800  BP: 122/70  Pulse: 60  Temp: 98.2 F (36.8 C)  SpO2: 98%  Weight: 164 lb 6.4 oz (74.6 kg)  Height: 6' (1.829 m)     Physical Exam Vitals reviewed.  Constitutional:      Appearance: He is well-developed.  HENT:     Head: Normocephalic and atraumatic.     Right Ear: External ear normal.     Left Ear: External ear normal.  Eyes:     Conjunctiva/sclera: Conjunctivae normal.     Pupils: Pupils are equal, round, and reactive to light.  Neck:     Thyroid: No thyromegaly.  Cardiovascular:     Rate and Rhythm: Normal rate and regular rhythm.     Heart sounds: Normal heart sounds.  Pulmonary:     Effort: Pulmonary effort is normal. No respiratory distress.     Breath sounds: Normal breath sounds. No wheezing.  Abdominal:     General: There is no distension.     Palpations: Abdomen is soft.     Tenderness: There is no abdominal tenderness.  Musculoskeletal:        General: No tenderness. Normal range of motion.     Cervical back: Normal range of motion and neck supple.  Lymphadenopathy:     Cervical: No cervical adenopathy.  Skin:    General: Skin is warm and dry.  Neurological:     Mental Status: He is alert and  oriented to person, place, and time.     Deep Tendon Reflexes:  Reflexes are normal and symmetric.  Psychiatric:        Behavior: Behavior normal.        Assessment & Plan:  Bexton Haak. is a 67 y.o. male . Annual physical exam  History of CVA (cerebrovascular accident) - Plan: Comprehensive metabolic panel, Lipid panel  Mild cognitive impairment  Atrial fibrillation, unspecified type ([place]) - Plan: CBC  On anticoagulant therapy - Plan: CBC  -anticipatory guidance as below in AVS, screening labs above. Health maintenance items as above in HPI discussed/recommended as applicable.  -Check monitoring labs for use of statin, anticoagulant.  Blood pressure controlled, previous LDL controlled, secondary prevention for history of CVA and mild cognitive impairment.  Continue recommendations per neurology, and also discussed MIND diet.  Commended on exercise and other current interventions. -Recheck 1 year.  Sooner if needed.  Continue follow-up with specialists as planned  No orders of the defined types were placed in this encounter.  Patient Instructions  Thanks for coming in today.  No med changes at this time.  I will check some screening labs.  Continue exercise, and games/puzzles, other recommendations from neurology, and MIND diet is an option as well to help with memory maintenance.  Take care.   Preventive Care 11 Years and Older, Male Preventive care refers to lifestyle choices and visits with your health care provider that can promote health and wellness. Preventive care visits are also called wellness exams. What can I expect for my preventive care visit? Counseling During your preventive care visit, your health care provider may ask about your: Medical history, including: Past medical problems. Family medical history. History of falls. Current health, including: Emotional well-being. Home life and relationship well-being. Sexual activity. Memory and ability to  understand (cognition). Lifestyle, including: Alcohol, nicotine or tobacco, and drug use. Access to firearms. Diet, exercise, and sleep habits. Work and work Statistician. Sunscreen use. Safety issues such as seatbelt and bike helmet use. Physical exam Your health care provider will check your: Height and weight. These may be used to calculate your BMI (body mass index). BMI is a measurement that tells if you are at a healthy weight. Waist circumference. This measures the distance around your waistline. This measurement also tells if you are at a healthy weight and may help predict your risk of certain diseases, such as type 2 diabetes and high blood pressure. Heart rate and blood pressure. Body temperature. Skin for abnormal spots. What immunizations do I need?  Vaccines are usually given at various ages, according to a schedule. Your health care provider will recommend vaccines for you based on your age, medical history, and lifestyle or other factors, such as travel or where you work. What tests do I need? Screening Your health care provider may recommend screening tests for certain conditions. This may include: Lipid and cholesterol levels. Diabetes screening. This is done by checking your blood sugar (glucose) after you have not eaten for a while (fasting). Hepatitis C test. Hepatitis B test. HIV (human immunodeficiency virus) test. STI (sexually transmitted infection) testing, if you are at risk. Lung cancer screening. Colorectal cancer screening. Prostate cancer screening. Abdominal aortic aneurysm (AAA) screening. You may need this if you are a current or former smoker. Talk with your health care provider about your test results, treatment options, and if necessary, the need for more tests. Follow these instructions at home: Eating and drinking  Eat a diet that includes fresh fruits and vegetables, whole grains, lean protein, and low-fat dairy products.  Limit your intake of  foods with high amounts of sugar, saturated fats, and salt. Take vitamin and mineral supplements as recommended by your health care provider. Do not drink alcohol if your health care provider tells you not to drink. If you drink alcohol: Limit how much you have to 0-2 drinks a day. Know how much alcohol is in your drink. In the [place] one drink equals one 12 oz bottle of beer (355 mL), one 5 oz glass of wine (148 mL), or one 1 oz glass of hard liquor (44 mL). Lifestyle Brush your teeth every morning and night with fluoride toothpaste. Floss one time each day. Exercise for at least 30 minutes 5 or more days each week. Do not use any products that contain nicotine or tobacco. These products include cigarettes, chewing tobacco, and vaping devices, such as e-cigarettes. If you need help quitting, ask your health care provider. Do not use drugs. If you are sexually active, practice safe sex. Use a condom or other form of protection to prevent STIs. Take aspirin only as told by your health care provider. Make sure that you understand how much to take and what form to take. Work with your health care provider to find out whether it is safe and beneficial for you to take aspirin daily. Ask your health care provider if you need to take a cholesterol-lowering medicine (statin). Find healthy ways to manage stress, such as: Meditation, yoga, or listening to music. Journaling. Talking to a trusted person. Spending time with friends and family. Safety Always wear your seat belt while driving or riding in a vehicle. Do not drive: If you have been drinking alcohol. Do not ride with someone who has been drinking. When you are tired or distracted. While texting. If you have been using any mind-altering substances or drugs. Wear a helmet and other protective equipment during sports activities. If you have firearms in your house, make sure you follow all gun safety procedures. Minimize exposure to UV  radiation to reduce your risk of skin cancer. What's next? Visit your health care provider once a year for an annual wellness visit. Ask your health care provider how often you should have your eyes and teeth checked. Stay up to date on all vaccines. This information is not intended to replace advice given to you by your health care provider. Make sure you discuss any questions you have with your health care provider. Document Revised: 01/12/2021 Document Reviewed: 01/12/2021 Elsevier Patient Education  [place],   Merri Ray, MD [place], [facility] 08/07/22 8:40 AM

## 2022-08-07 NOTE — Patient Instructions (Signed)
Thanks for coming in today.  No med changes at this time.  I will check some screening labs.  Continue exercise, and games/puzzles, other recommendations from neurology, and MIND diet is an option as well to help with memory maintenance.  Take care.   Preventive Care 20 Years and Older, Male Preventive care refers to lifestyle choices and visits with your health care provider that can promote health and wellness. Preventive care visits are also called wellness exams. What can I expect for my preventive care visit? Counseling During your preventive care visit, your health care provider may ask about your: Medical history, including: Past medical problems. Family medical history. History of falls. Current health, including: Emotional well-being. Home life and relationship well-being. Sexual activity. Memory and ability to understand (cognition). Lifestyle, including: Alcohol, nicotine or tobacco, and drug use. Access to firearms. Diet, exercise, and sleep habits. Work and work Statistician. Sunscreen use. Safety issues such as seatbelt and bike helmet use. Physical exam Your health care provider will check your: Height and weight. These may be used to calculate your BMI (body mass index). BMI is a measurement that tells if you are at a healthy weight. Waist circumference. This measures the distance around your waistline. This measurement also tells if you are at a healthy weight and may help predict your risk of certain diseases, such as type 2 diabetes and high blood pressure. Heart rate and blood pressure. Body temperature. Skin for abnormal spots. What immunizations do I need?  Vaccines are usually given at various ages, according to a schedule. Your health care provider will recommend vaccines for you based on your age, medical history, and lifestyle or other factors, such as travel or where you work. What tests do I need? Screening Your health care provider may recommend  screening tests for certain conditions. This may include: Lipid and cholesterol levels. Diabetes screening. This is done by checking your blood sugar (glucose) after you have not eaten for a while (fasting). Hepatitis C test. Hepatitis B test. HIV (human immunodeficiency virus) test. STI (sexually transmitted infection) testing, if you are at risk. Lung cancer screening. Colorectal cancer screening. Prostate cancer screening. Abdominal aortic aneurysm (AAA) screening. You may need this if you are a current or former smoker. Talk with your health care provider about your test results, treatment options, and if necessary, the need for more tests. Follow these instructions at home: Eating and drinking  Eat a diet that includes fresh fruits and vegetables, whole grains, lean protein, and low-fat dairy products. Limit your intake of foods with high amounts of sugar, saturated fats, and salt. Take vitamin and mineral supplements as recommended by your health care provider. Do not drink alcohol if your health care provider tells you not to drink. If you drink alcohol: Limit how much you have to 0-2 drinks a day. Know how much alcohol is in your drink. In the [place] one drink equals one 12 oz bottle of beer (355 mL), one 5 oz glass of wine (148 mL), or one 1 oz glass of hard liquor (44 mL). Lifestyle Brush your teeth every morning and night with fluoride toothpaste. Floss one time each day. Exercise for at least 30 minutes 5 or more days each week. Do not use any products that contain nicotine or tobacco. These products include cigarettes, chewing tobacco, and vaping devices, such as e-cigarettes. If you need help quitting, ask your health care provider. Do not use drugs. If you are sexually active, practice safe sex. Use a  condom or other form of protection to prevent STIs. Take aspirin only as told by your health care provider. Make sure that you understand how much to take and what form to  take. Work with your health care provider to find out whether it is safe and beneficial for you to take aspirin daily. Ask your health care provider if you need to take a cholesterol-lowering medicine (statin). Find healthy ways to manage stress, such as: Meditation, yoga, or listening to music. Journaling. Talking to a trusted person. Spending time with friends and family. Safety Always wear your seat belt while driving or riding in a vehicle. Do not drive: If you have been drinking alcohol. Do not ride with someone who has been drinking. When you are tired or distracted. While texting. If you have been using any mind-altering substances or drugs. Wear a helmet and other protective equipment during sports activities. If you have firearms in your house, make sure you follow all gun safety procedures. Minimize exposure to UV radiation to reduce your risk of skin cancer. What's next? Visit your health care provider once a year for an annual wellness visit. Ask your health care provider how often you should have your eyes and teeth checked. Stay up to date on all vaccines. This information is not intended to replace advice given to you by your health care provider. Make sure you discuss any questions you have with your health care provider. Document Revised: 01/12/2021 Document Reviewed: 01/12/2021 Elsevier Patient Education  [place].

## 2022-08-08 ENCOUNTER — Encounter: Payer: Self-pay | Admitting: Family Medicine

## 2022-08-28 DIAGNOSIS — H5213 Myopia, bilateral: Secondary | ICD-10-CM | POA: Diagnosis not present

## 2022-09-19 ENCOUNTER — Ambulatory Visit (INDEPENDENT_AMBULATORY_CARE_PROVIDER_SITE_OTHER): Payer: Medicare Other

## 2022-09-19 VITALS — Ht 73.0 in | Wt 164.0 lb

## 2022-09-19 DIAGNOSIS — Z0000 Encounter for general adult medical examination without abnormal findings: Secondary | ICD-10-CM | POA: Diagnosis not present

## 2022-09-19 NOTE — Progress Notes (Signed)
Subjective:   Nicholas West. is a 67 y.o. male who presents for Medicare Annual/Subsequent preventive examination.  Review of Systems    Virtual Visit via Telephone Note  I connected with  Alfonso Patten. on 09/19/22 at  1:30 PM EST by telephone and verified that I am speaking with the correct person using two identifiers.  Location: Patient: Home Provider: Office Persons participating in the virtual visit: patient/Nurse Health Advisor   I discussed the limitations, risks, security and privacy concerns of performing an evaluation and management service by telephone and the availability of in person appointments. The patient expressed understanding and agreed to proceed.  Interactive audio and video telecommunications were attempted between this nurse and patient, however failed, due to patient having technical difficulties OR patient did not have access to video capability.  We continued and completed visit with audio only.  Some vital signs may be absent or patient reported.   Criselda Peaches, LPN  Cardiac Risk Factors include: advanced age (>20mn, >>20women);male gender;Other (see comment), Risk factor comments: Dx Afib     Objective:    Today's Vitals   09/19/22 1331  Weight: 164 lb (74.4 kg)  Height: 6' 1"$  (1.854 m)   Body mass index is 21.64 kg/m.     09/19/2022    1:41 PM 06/14/2022    9:31 AM 12/20/2021    8:54 AM 12/16/2021   11:07 AM 11/06/2021    8:05 PM 10/14/2021    9:06 AM 09/16/2021    9:56 AM  Advanced Directives  Does Patient Have a Medical Advance Directive? Yes Yes Yes No No Yes Yes  Type of AParamedicof [place] will [place] will Living will;Healthcare Power of [place];Out of facility DNR (pink MOST or yellow form)   Copy of [place] Chart? No - copy requested        Would patient like information on creating a medical  advance directive?     No - Patient declined      Current Medications (verified) Outpatient Encounter Medications as of 09/19/2022  Medication Sig   apixaban (ELIQUIS) 5 MG TABS tablet Take 1 tablet (5 mg total) by mouth 2 (two) times daily.   B-D 3CC LUER-LOK SYR 23GX1" 23G X 1" 3 ML MISC SMARTSIG:1 Injection Once a Week   B-D HYPODERMIC NEEDLE 18GX1.5" 18G X 1-1/2" MISC USE TO DRAW UP MEDICATION   calcium carbonate (OS-CAL - DOSED IN MG OF ELEMENTAL CALCIUM) 1250 MG tablet Take 2 tablets by mouth 2 (two) times daily with a meal.    cholecalciferol (VITAMIN D) 1000 UNITS tablet Take 1,000 Units by mouth 2 (two) times daily.   diltiazem (CARDIZEM CD) 240 MG 24 hr capsule TAKE 1 CAPSULE(240 MG) BY MOUTH DAILY   Docusate Sodium (COLACE PO) Take 1 tablet by mouth as directed.   metroNIDAZOLE (METROCREAM) 0.75 % cream Apply 1 Application topically 2 (two) times daily. Hasn't started yet.   Multiple Vitamin (MULTIVITAMIN) tablet Take 1 tablet by mouth daily.   Omega-3 Fatty Acids (OMEGA 3 PO) Take 2 capsules by mouth daily.    omeprazole (PRILOSEC) 20 MG capsule Take 1 capsule (20 mg total) by mouth daily.   Probiotic Product (PROBIOTIC-10 PO) Take 1 tablet by mouth daily.   rosuvastatin (CRESTOR) 10 MG tablet Take 1 tablet (10 mg total) by mouth daily. (Patient taking differently: Take 10 mg by mouth  daily. Pt is taking)   sildenafil (VIAGRA) 100 MG tablet Take 100 mg by mouth as needed.   testosterone cypionate (DEPOTESTOSTERONE CYPIONATE) 200 MG/ML injection Inject into the muscle once a week.    No facility-administered encounter medications on file as of 09/19/2022.    Allergies (verified) Penicillins   History: Past Medical History:  Diagnosis Date   Abdominal pain    BCC (basal cell carcinoma) 02/05/2013   Recurrent, top right scalp, MOHs   Cerebrovascular disease    Chest pain    negative stress echo in September 2013   Contusion of right elbow 10/11/2016   Diverticulitis     Dysplastic nevus    moderate, Left back no treatment   GERD (gastroesophageal reflux disease)    Hiatal hernia 08/02/2020   Hypercholesterolemia 04/03/2012   Laceration of hand without foreign body 10/11/2016   Lacunar infarction 10/10/2021   MRI - subtle area of subacute gyriform enhancement in the left occipital pole   Male hypogonadism 03/05/2012   Mild cognitive impairment of uncertain or unknown etiology 05/24/2022   Paroxysmal atrial fibrillation 10/24/2012   Past Surgical History:  Procedure Laterality Date   COLON SURGERY     PARTIAL COLECTOMY  11/11   Family History  Problem Relation Age of Onset   Cancer Mother    Alzheimer's disease Father        onset in mid-49s   Colon polyps Father    GER disease Father    Dementia Sister        unspecified type; rapidly progressing   Heart disease Paternal Grandmother    Cancer Paternal Grandfather    Social History   Socioeconomic History   Marital status: Married    Spouse name: Not on file   Number of children: 0   Years of education: 69   Highest education level: Professional school degree (e.g., MD, DDS, DVM, JD)  Occupational History   Occupation: attorney    Employer: [employer]  Tobacco Use   Smoking status: Never   Smokeless tobacco: Never  Vaping Use   Vaping Use: Never used  Substance and Sexual Activity   Alcohol use: No   Drug use: No   Sexual activity: Yes  Other Topics Concern   Not on file  Social History Narrative   Married   Exercise: Yes   Education: College   Right handed    Decaffeine    Two story home   Social Determinants of Health   Financial Resource Strain: Low Risk  (09/19/2022)   Overall Financial Resource Strain (CARDIA)    Difficulty of Paying Living Expenses: Not hard at all  Food Insecurity: No Food Insecurity (09/19/2022)   Hunger Vital Sign    Worried About Running Out of Food in the Last Year: Never true    Ran Out of Food in the Last Year: Never true   Transportation Needs: No Transportation Needs (09/19/2022)   PRAPARE - Hydrologist (Medical): No    Lack of Transportation (Non-Medical): No  Physical Activity: Sufficiently Active (09/19/2022)   Exercise Vital Sign    Days of Exercise per Week: 6 days    Minutes of Exercise per Session: 60 min  Stress: No Stress Concern Present (09/19/2022)   [place]    Feeling of Stress : Not at all  Social Connections: [place] (09/19/2022)   Social Connection and Isolation Panel [NHANES]    Frequency  of Communication with Friends and Family: More than three times a week    Frequency of Social Gatherings with Friends and Family: More than three times a week    Attends Religious Services: More than 4 times per year    Active Member of [employer] or Organizations: Yes    Attends Music therapist: More than 4 times per year    Marital Status: Married    Tobacco Counseling Counseling given: Not Answered   Clinical Intake:  Pre-visit preparation completed: Yes  Pain : No/denies pain     BMI - recorded: 21.64 Nutritional Status: BMI of 19-24  Normal Nutritional Risks: None Diabetes: No  How often do you need to have someone help you when you read instructions, pamphlets, or other written materials from your doctor or pharmacy?: 1 - Never  Diabetic?  No  Interpreter Needed?: No  Information entered by :: Rolene Arbour LPN   Activities of Daily Living    09/19/2022    1:38 PM 09/18/2022   12:18 PM  In your present state of health, do you have any difficulty performing the following activities:  Hearing? 1 1  Comment Wears hearing aids   Vision? 0 0  Difficulty concentrating or making decisions? 0 1  Walking or climbing stairs? 0 1  Dressing or bathing? 0 0  Doing errands, shopping? 0 0  Preparing Food and eating ? N N  Using the Toilet? N N  In the past six months,  have you accidently leaked urine? N N  Do you have problems with loss of bowel control? N N  Managing your Medications? N N  Managing your Finances? N N  Housekeeping or managing your Housekeeping? N N    Patient Care Team: Wendie Agreste, MD as PCP - General (Family Medicine) Lavonna Monarch, MD (Inactive) as Consulting Physician (Dermatology) [place], Peter M, MD as Consulting Physician (Cardiology) Franchot Gallo, MD as Consulting Physician (Urology) Elease Hashimoto (Neurology)  Indicate any recent Medical Services you may have received from other than [facility] providers in the past year (date may be approximate).     Assessment:   This is a routine wellness examination for Zev.  Hearing/Vision screen Hearing Screening - Comments:: Wears hearing aids Vision Screening - Comments:: Wears rx glasses/contacts  - up to date with routine eye exams with Dr Prudencio Burly  Dietary issues and exercise activities discussed: Current Exercise Habits: Home exercise routine, Type of exercise: walking, Time (Minutes): 60, Frequency (Times/Week): 6, Weekly Exercise (Minutes/Week): 360, Intensity: Moderate, Exercise limited by: None identified   Goals Addressed               This Visit's Progress     Patient stated (pt-stated)        I would like to get retired.       Depression Screen    09/19/2022    1:36 PM 08/07/2022    8:02 AM 06/07/2022    9:55 AM 11/25/2021    8:51 AM 09/07/2021    8:57 AM 08/04/2021    8:08 AM 08/02/2020    9:08 AM  PHQ 2/9 Scores  PHQ - 2 Score 0 0 0 0 0 0 0  PHQ- 9 Score 0 0 0 1 0      Fall Risk    09/19/2022    1:38 PM 09/18/2022   12:18 PM 09/03/2022    3:05 PM 08/07/2022    8:02 AM 06/14/2022    9:31 AM  Fall  Risk   Falls in the past year? 1 1 1 1 $ 0  Number falls in past yr: 0 0 0 0 0  Injury with Fall? 1 1 1 1 $ 0  Comment Shoulder and left hip injury followed by medical attention      Risk for fall due to : No Fall Risks   History of fall(s)    Follow up Falls prevention discussed   Falls evaluation completed Falls evaluation completed    [place]:  Any stairs in or around the home? Yes  If so, are there any without handrails? No  Home free of loose throw rugs in walkways, pet beds, electrical cords, etc? Yes  Adequate lighting in your home to reduce risk of falls? Yes   ASSISTIVE DEVICES UTILIZED TO PREVENT FALLS:  Life alert? No  Use of a cane, walker or w/c? No  Grab bars in the bathroom? No  Shower chair or bench in shower? No  Elevated toilet seat or a handicapped toilet? No   TIMED UP AND GO:  Was the test performed? No . Audio Visit  Cognitive Function:      09/18/2021    8:00 PM  Montreal Cognitive Assessment   Visuospatial/ Executive (0/5) 4  Naming (0/3) 3  Attention: Read list of digits (0/2) 2  Attention: Read list of letters (0/1) 1  Attention: Serial 7 subtraction starting at 100 (0/3) 3  Language: Repeat phrase (0/2) 2  Language : Fluency (0/1) 1  Abstraction (0/2) 2  Delayed Recall (0/5) 5  Orientation (0/6) 6  Total 29  Adjusted Score (based on education) 29      09/19/2022    1:41 PM 08/04/2021    8:52 AM 08/02/2020    9:08 AM 11/26/2017    9:03 AM  6CIT Screen  What Year? 0 points 0 points 0 points 0 points  What month? 0 points 0 points 0 points 0 points  What time? 0 points 3 points 3 points 0 points  Count back from 20 0 points 0 points 0 points 0 points  Months in reverse 0 points 0 points 0 points 0 points  Repeat phrase 0 points 0 points 0 points 0 points  Total Score 0 points 3 points 3 points 0 points    Immunizations Immunization History  Administered Date(s) Administered   COVID-19, mRNA, vaccine(Comirnaty)12 years and older 06/05/2022   Influenza Split 04/30/2012, 05/26/2016   Influenza,inj,Quad PF,6+ Mos 04/22/2013, 06/01/2014, 05/06/2015, 05/25/2017, 05/25/2018, 04/28/2019, 05/04/2022   Influenza-Unspecified 07/16/2020   PFIZER  Comirnaty(Gray Top)Covid-19 Tri-Sucrose Vaccine 12/03/2020   PFIZER(Purple Top)SARS-COV-2 Vaccination 09/06/2019, 10/01/2019, 05/15/2020   Pfizer Covid-19 Vaccine Bivalent Booster 31yr & up 05/18/2021, 06/05/2022   Pneumococcal Conjugate-13 08/02/2020   Pneumococcal Polysaccharide-23 08/04/2021   Respiratory Syncytial Virus Vaccine,Recomb Aduvanted(Arexvy) 06/19/2022   Tdap 04/21/2008   Zoster Recombinat (Shingrix) 06/27/2019, 11/22/2019   Zoster, Live 07/31/2014, 03/20/2015      Flu Vaccine status: Up to date  Pneumococcal vaccine status: Up to date  Covid-19 vaccine status: Completed vaccines  Qualifies for Shingles Vaccine? Yes   Zostavax completed Yes   Shingrix Completed?: Yes  Screening Tests Health Maintenance  Topic Date Due   COVID-19 Vaccine (7 - 2023-24 season) 10/05/2022 (Originally 07/31/2022)   Medicare Annual Wellness ([place]  09/20/2023   COLONOSCOPY (Pts 45-444yrInsurance coverage will need to be confirmed)  07/12/2032   Pneumonia Vaccine 6571Years old  Completed   INFLUENZA VACCINE  Completed   Hepatitis  C Screening  Completed   Zoster Vaccines- Shingrix  Completed   HPV VACCINES  Aged Out   DTaP/Tdap/Td  Discontinued    Health Maintenance  There are no preventive care reminders to display for this patient.   Colorectal cancer screening: Type of screening: Colonoscopy. Completed 07/12/22. Repeat every 10 years  Lung Cancer Screening: (Low Dose CT Chest recommended if Age 49-80 years, 30 pack-year currently smoking OR have quit w/in 15years.) does not qualify.     Additional Screening:  Hepatitis C Screening: does qualify; Completed 03/05/12  Vision Screening: Recommended annual ophthalmology exams for early detection of glaucoma and other disorders of the eye. Is the patient up to date with their annual eye exam?  Yes  Who is the provider or what is the name of the office in which the patient attends annual eye exams? Dr Prudencio Burly If pt is not  established with a provider, would they like to be referred to a provider to establish care? No .   Dental Screening: Recommended annual dental exams for proper oral hygiene  Community Resource Referral / Chronic Care Management: CRR required this visit?  No   CCM required this visit?  No      Plan:     I have personally reviewed and noted the following in the patient's chart:   Medical and social history Use of alcohol, tobacco or illicit drugs  Current medications and supplements including opioid prescriptions. Patient is not currently taking opioid prescriptions. Functional ability and status Nutritional status Physical activity Advanced directives List of other physicians Hospitalizations, surgeries, and ER visits in previous 12 months Vitals Screenings to include cognitive, depression, and falls Referrals and appointments  In addition, I have reviewed and discussed with patient certain preventive protocols, quality metrics, and best practice recommendations. A written personalized care plan for preventive services as well as general preventive health recommendations were provided to patient.     Criselda Peaches, LPN   [id number]   Nurse Notes: None

## 2022-09-19 NOTE — Patient Instructions (Addendum)
Nicholas West , Thank you for taking time to come for your Medicare Wellness Visit. I appreciate your ongoing commitment to your health goals. Please review the following plan we discussed and let me know if I can assist you in the future.   These are the goals we discussed:  Goals       Patient stated (pt-stated)      I would like to get retired.        This is a list of the screening recommended for you and due dates:  Health Maintenance  Topic Date Due   COVID-19 Vaccine (7 - 2023-24 season) 10/05/2022*   Medicare Annual Wellness Visit  09/20/2023   Colon Cancer Screening  07/12/2032   Pneumonia Vaccine  Completed   Flu Shot  Completed   Hepatitis C Screening: USPSTF Recommendation to screen - Ages 18-79 yo.  Completed   Zoster (Shingles) Vaccine  Completed   HPV Vaccine  Aged Out   DTaP/Tdap/Td vaccine  Discontinued  *Topic was postponed. The date shown is not the original due date.    Advanced directives: Please bring a copy of your health care power of attorney and living will to the office to be added to your chart at your convenience.   Conditions/risks identified: None  Next appointment: Follow up in one year for your annual wellness visit.   Preventive Care 45 Years and Older, Male  Preventive care refers to lifestyle choices and visits with your health care provider that can promote health and wellness. What does preventive care include? A yearly physical exam. This is also called an annual well check. Dental exams once or twice a year. Routine eye exams. Ask your health care provider how often you should have your eyes checked. Personal lifestyle choices, including: Daily care of your teeth and gums. Regular physical activity. Eating a healthy diet. Avoiding tobacco and drug use. Limiting alcohol use. Practicing safe sex. Taking low doses of aspirin every day. Taking vitamin and mineral supplements as recommended by your health care provider. What happens  during an annual well check? The services and screenings done by your health care provider during your annual well check will depend on your age, overall health, lifestyle risk factors, and family history of disease. Counseling  Your health care provider may ask you questions about your: Alcohol use. Tobacco use. Drug use. Emotional well-being. Home and relationship well-being. Sexual activity. Eating habits. History of falls. Memory and ability to understand (cognition). Work and work Statistician. Screening  You may have the following tests or measurements: Height, weight, and BMI. Blood pressure. Lipid and cholesterol levels. These may be checked every 5 years, or more frequently if you are over 96 years old. Skin check. Lung cancer screening. You may have this screening every year starting at age 62 if you have a 30-pack-year history of smoking and currently smoke or have quit within the past 15 years. Fecal occult blood test (FOBT) of the stool. You may have this test every year starting at age 4. Flexible sigmoidoscopy or colonoscopy. You may have a sigmoidoscopy every 5 years or a colonoscopy every 10 years starting at age 45. Prostate cancer screening. Recommendations will vary depending on your family history and other risks. Hepatitis C blood test. Hepatitis B blood test. Sexually transmitted disease (STD) testing. Diabetes screening. This is done by checking your blood sugar (glucose) after you have not eaten for a while (fasting). You may have this done every 1-3 years. Abdominal aortic aneurysm (  AAA) screening. You may need this if you are a current or former smoker. Osteoporosis. You may be screened starting at age 7 if you are at high risk. Talk with your health care provider about your test results, treatment options, and if necessary, the need for more tests. Vaccines  Your health care provider may recommend certain vaccines, such as: Influenza vaccine. This is  recommended every year. Tetanus, diphtheria, and acellular pertussis (Tdap, Td) vaccine. You may need a Td booster every 10 years. Zoster vaccine. You may need this after age 110. Pneumococcal 13-valent conjugate (PCV13) vaccine. One dose is recommended after age 9. Pneumococcal polysaccharide (PPSV23) vaccine. One dose is recommended after age 70. Talk to your health care provider about which screenings and vaccines you need and how often you need them. This information is not intended to replace advice given to you by your health care provider. Make sure you discuss any questions you have with your health care provider. Document Released: 08/13/2015 Document Revised: 04/05/2016 Document Reviewed: 05/18/2015 Elsevier Interactive Patient Education  2017 [place] Prevention in the Home Falls can cause injuries. They can happen to people of all ages. There are many things you can do to make your home safe and to help prevent falls. What can I do on the outside of my home? Regularly fix the edges of walkways and driveways and fix any cracks. Remove anything that might make you trip as you walk through a door, such as a raised step or threshold. Trim any bushes or trees on the path to your home. Use bright outdoor lighting. Clear any walking paths of anything that might make someone trip, such as rocks or tools. Regularly check to see if handrails are loose or broken. Make sure that both sides of any steps have handrails. Any raised decks and porches should have guardrails on the edges. Have any leaves, snow, or ice cleared regularly. Use sand or salt on walking paths during winter. Clean up any spills in your garage right away. This includes oil or grease spills. What can I do in the bathroom? Use night lights. Install grab bars by the toilet and in the tub and shower. Do not use towel bars as grab bars. Use non-skid mats or decals in the tub or shower. If you need to sit down in  the shower, use a plastic, non-slip stool. Keep the floor dry. Clean up any water that spills on the floor as soon as it happens. Remove soap buildup in the tub or shower regularly. Attach bath mats securely with double-sided non-slip rug tape. Do not have throw rugs and other things on the floor that can make you trip. What can I do in the bedroom? Use night lights. Make sure that you have a light by your bed that is easy to reach. Do not use any sheets or blankets that are too big for your bed. They should not hang down onto the floor. Have a firm chair that has side arms. You can use this for support while you get dressed. Do not have throw rugs and other things on the floor that can make you trip. What can I do in the kitchen? Clean up any spills right away. Avoid walking on wet floors. Keep items that you use a lot in easy-to-reach places. If you need to reach something above you, use a strong step stool that has a grab bar. Keep electrical cords out of the way. Do not use floor polish  or wax that makes floors slippery. If you must use wax, use non-skid floor wax. Do not have throw rugs and other things on the floor that can make you trip. What can I do with my stairs? Do not leave any items on the stairs. Make sure that there are handrails on both sides of the stairs and use them. Fix handrails that are broken or loose. Make sure that handrails are as long as the stairways. Check any carpeting to make sure that it is firmly attached to the stairs. Fix any carpet that is loose or worn. Avoid having throw rugs at the top or bottom of the stairs. If you do have throw rugs, attach them to the floor with carpet tape. Make sure that you have a light switch at the top of the stairs and the bottom of the stairs. If you do not have them, ask someone to add them for you. What else can I do to help prevent falls? Wear shoes that: Do not have high heels. Have rubber bottoms. Are comfortable  and fit you well. Are closed at the toe. Do not wear sandals. If you use a stepladder: Make sure that it is fully opened. Do not climb a closed stepladder. Make sure that both sides of the stepladder are locked into place. Ask someone to hold it for you, if possible. Clearly mark and make sure that you can see: Any grab bars or handrails. First and last steps. Where the edge of each step is. Use tools that help you move around (mobility aids) if they are needed. These include: Canes. Walkers. Scooters. Crutches. Turn on the lights when you go into a dark area. Replace any light bulbs as soon as they burn out. Set up your furniture so you have a clear path. Avoid moving your furniture around. If any of your floors are uneven, fix them. If there are any pets around you, be aware of where they are. Review your medicines with your doctor. Some medicines can make you feel dizzy. This can increase your chance of falling. Ask your doctor what other things that you can do to help prevent falls. This information is not intended to replace advice given to you by your health care provider. Make sure you discuss any questions you have with your health care provider. Document Released: 05/13/2009 Document Revised: 12/23/2015 Document Reviewed: 08/21/2014 Elsevier Interactive Patient Education  2017 [employer].

## 2022-10-18 ENCOUNTER — Other Ambulatory Visit: Payer: Self-pay | Admitting: Cardiology

## 2022-10-23 ENCOUNTER — Ambulatory Visit: Payer: Medicare Other | Admitting: Dermatology

## 2022-10-23 DIAGNOSIS — K080 Exfoliation of teeth due to systemic causes: Secondary | ICD-10-CM | POA: Diagnosis not present

## 2022-10-28 ENCOUNTER — Other Ambulatory Visit: Payer: Self-pay | Admitting: Cardiology

## 2022-11-07 ENCOUNTER — Other Ambulatory Visit: Payer: Self-pay | Admitting: Cardiology

## 2022-11-07 DIAGNOSIS — I480 Paroxysmal atrial fibrillation: Secondary | ICD-10-CM

## 2022-11-07 NOTE — Telephone Encounter (Signed)
Eliquis 5mg  refill request received. Patient is 67 years old, weight-74.4kg, Crea-0.88 on 08/07/22, Diagnosis-Afib, and last seen by Dr. [place] on 05/05/22. Dose is appropriate based on dosing criteria. Will send in refill to requested pharmacy.

## 2022-11-09 DIAGNOSIS — L718 Other rosacea: Secondary | ICD-10-CM | POA: Diagnosis not present

## 2022-11-09 DIAGNOSIS — C4441 Basal cell carcinoma of skin of scalp and neck: Secondary | ICD-10-CM | POA: Diagnosis not present

## 2022-11-09 DIAGNOSIS — L570 Actinic keratosis: Secondary | ICD-10-CM | POA: Diagnosis not present

## 2022-11-20 DIAGNOSIS — H903 Sensorineural hearing loss, bilateral: Secondary | ICD-10-CM | POA: Diagnosis not present

## 2022-11-20 DIAGNOSIS — H6123 Impacted cerumen, bilateral: Secondary | ICD-10-CM | POA: Diagnosis not present

## 2022-12-03 NOTE — Progress Notes (Unsigned)
Nicholas West. Date of Birth: 1956-07-12 Medical Record #[id number]  History of Present Illness: Nicholas West is seen for follow up of atrial fibrillation.   He has a history of paroxysmal atrial fibrillation. He has been managed with rate control with diltiazem. He previously had a [place] vascular score of 0 and was  taking a baby aspirin daily.   In 2019 he complained of nausea and tingling in his arms with exercise. We arranged a POET. On arrival he was in AFib with rate 98. He exercised for 9 minutes on the Bruce protocol without symptoms or ST changes. He converted to NSR then went back into atrial flutter with RVR. His Cardizem was increased to 240 mg daily.    Recently he was experiencing some issues with his balance and memory loss. He had a couple of falls. Was evaluated by Dr Gwynneth Munson. EEG and extensive lab work OK. Cranial MRI showed evidence of subacute infarct in the  left occipital pole. MRA showed modest stenosis in the A2 segment of the right anterior cerebral artery. He denies any chest pain, palpitations. No dyspnea.   On follow up today he is doing very well. Mild Afib symptoms at times. No chest pain or SOB. 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 Feels very well.   Current Outpatient Medications on File Prior to Visit  Medication Sig Dispense Refill   apixaban (ELIQUIS) 5 MG TABS tablet TAKE 1 TABLET(5 MG) BY MOUTH TWICE DAILY 180 tablet 1   B-D 3CC LUER-LOK SYR 23GX1" 23G X 1" 3 ML MISC SMARTSIG:1 Injection Once a Week     B-D HYPODERMIC NEEDLE 18GX1.5" 18G X 1-1/2" MISC USE TO DRAW UP MEDICATION     calcium carbonate (OS-CAL - DOSED IN MG OF ELEMENTAL CALCIUM) 1250 MG tablet Take 2 tablets by mouth 2 (two) times daily with a meal.      cholecalciferol (VITAMIN D) 1000 UNITS tablet Take 1,000 Units by mouth 2 (two) times daily.     Docusate Sodium (COLACE PO) Take 1 tablet by mouth as directed.     metroNIDAZOLE (METROCREAM) 0.75 % cream Apply 1 Application topically 2 (two)  times daily. Hasn't started yet.     Multiple Vitamin (MULTIVITAMIN) tablet Take 1 tablet by mouth daily.     Omega-3 Fatty Acids (OMEGA 3 PO) Take 2 capsules by mouth daily.      omeprazole (PRILOSEC) 20 MG capsule Take 1 capsule (20 mg total) by mouth daily. 30 capsule 11   Probiotic Product (PROBIOTIC-10 PO) Take 1 tablet by mouth daily.     rosuvastatin (CRESTOR) 10 MG tablet TAKE 1 TABLET(10 MG) BY MOUTH DAILY 90 tablet 3   sildenafil (VIAGRA) 100 MG tablet Take 100 mg by mouth as needed.     testosterone cypionate (DEPOTESTOSTERONE CYPIONATE) 200 MG/ML injection Inject into the muscle once a week.      No current facility-administered medications on file prior to visit.    Allergies  Allergen Reactions   Penicillins     rash    Past Medical History:  Diagnosis Date   Abdominal pain    BCC (basal cell carcinoma) 02/05/2013   Recurrent, top right scalp, MOHs   Cerebrovascular disease    Chest pain    negative stress echo in September 2013   Contusion of right elbow 10/11/2016   Diverticulitis    Dysplastic nevus    moderate, Left back no treatment   GERD (gastroesophageal reflux disease)    Hiatal hernia  08/02/2020   Hypercholesterolemia 04/03/2012   Laceration of hand without foreign body 10/11/2016   Lacunar infarction 10/10/2021   MRI - subtle area of subacute gyriform enhancement in the left occipital pole   Male hypogonadism 03/05/2012   Mild cognitive impairment of uncertain or unknown etiology 05/24/2022   Paroxysmal atrial fibrillation 10/24/2012    Past Surgical History:  Procedure Laterality Date   COLON SURGERY     PARTIAL COLECTOMY  11/11    Social History   Tobacco Use  Smoking Status Never  Smokeless Tobacco Never    Social History   Substance and Sexual Activity  Alcohol Use No    Family History  Problem Relation Age of Onset   Cancer Mother    Alzheimer's disease Father        onset in mid-60s   Colon polyps Father    GER disease  Father    Dementia Sister        unspecified type; rapidly progressing   Heart disease Paternal Grandmother    Cancer Paternal Grandfather     Review of Systems: The review of systems is per the HPI.  All other systems were reviewed and are negative.  Physical Exam: BP 114/64   Pulse (!) 50   Ht 6\' 1"  (1.854 m)   Wt 161 lb 12.8 oz (73.4 kg)   SpO2 98%   BMI 21.35 kg/m  GENERAL:  Well appearing WM in NAD HEENT:  PERRL, EOMI, sclera are clear. Oropharynx is clear. NECK:  No jugular venous distention, carotid upstroke brisk and symmetric, no bruits, no thyromegaly or adenopathy LUNGS:  Clear to auscultation bilaterally CHEST:  Unremarkable HEART:  RRR,  PMI not displaced or sustained,S1 and S2 within normal limits, no S3, no S4: no clicks, no rubs, no murmurs ABD:  Soft, nontender. BS +, no masses or bruits. No hepatomegaly, no splenomegaly EXT:  2 + pulses throughout, no edema, no cyanosis no clubbing SKIN:  Warm and dry.  No rashes NEURO:  Alert and oriented x 3. Cranial nerves II through XII intact. PSYCH:  Cognitively intact  LABORATORY DATA:   Lab Results  Component Value Date   WBC 5.3 08/07/2022   HGB 17.3 (H) 08/07/2022   HCT 50.7 08/07/2022   PLT 167.0 08/07/2022   GLUCOSE 83 08/07/2022   CHOL 112 08/07/2022   TRIG 61.0 08/07/2022   HDL 48.20 08/07/2022   LDLCALC 52 08/07/2022   ALT 32 08/07/2022   AST 34 08/07/2022   NA 141 08/07/2022   K 4.0 08/07/2022   CL 103 08/07/2022   CREATININE 0.88 08/07/2022   BUN 13 08/07/2022   CO2 31 08/07/2022   TSH 2.05 09/16/2021   PSA 1.38 10/29/2015   INR 1.10 06/04/2013   HGBA1C 5.4 08/04/2021     ETT 08/31/17: Study Highlights   Blood pressure demonstrated a blunted response to exercise. Patient started the test in atrial fibrillation and converted to sinus rhythm and subsequently atrial flutter with rapid ventricular response with exercise. There was no ST segment deviation noted during stress. Negative,  adequate stress test.   Echo 10/25/21: IMPRESSIONS     1. Left ventricular ejection fraction, by estimation, is 60%. Left  ventricular ejection fraction by 3D volume is 58 %. The left ventricle has  normal function. The left ventricle has no regional wall motion  abnormalities. Left ventricular diastolic  parameters are consistent with Grade I diastolic dysfunction (impaired  relaxation). The average left ventricular global longitudinal strain is  -  20.0 %. The global longitudinal strain is normal.   2. Right ventricular systolic function is normal. The right ventricular  size is normal. Tricuspid regurgitation signal is inadequate for assessing  PA pressure.   3. The mitral valve is normal in structure. Trivial mitral valve  regurgitation. No evidence of mitral stenosis.   4. The aortic valve is tricuspid. Aortic valve regurgitation is not  visualized. No aortic stenosis is present.   5. The inferior vena cava is normal in size with greater than 50%  respiratory variability, suggesting right atrial pressure of 3 mmHg.   Comparison(s): No prior Echocardiogram from records available.   Conclusion(s)/Recommendation(s): No intracardiac source of embolism  detected on this transthoracic study. Consider a transesophageal  echocardiogram to exclude cardiac source of embolism if clinically  indicated.   Assessment / Plan: 1. Paroxysmal atrial fibrillation and flutter. Patient is minimally symptomatic. Now with evidence of CVA on MRI his  [place] vascular score is 2. He is anticoagulated with Eliquis. Continue  diltiazem daily.Avoid NSAIDs. 2. Prior CVA on MRI 3. HLD. Excellent response with Crestor. LDL 52.    Follow up in 6 months.

## 2022-12-07 ENCOUNTER — Encounter: Payer: Self-pay | Admitting: Cardiology

## 2022-12-07 ENCOUNTER — Ambulatory Visit (INDEPENDENT_AMBULATORY_CARE_PROVIDER_SITE_OTHER): Payer: Medicare Other | Admitting: Family Medicine

## 2022-12-07 ENCOUNTER — Encounter: Payer: Self-pay | Admitting: Family Medicine

## 2022-12-07 ENCOUNTER — Ambulatory Visit: Payer: Medicare Other | Attending: Cardiology | Admitting: Cardiology

## 2022-12-07 VITALS — BP 112/64 | HR 65 | Temp 97.9°F | Ht 73.0 in | Wt 164.0 lb

## 2022-12-07 VITALS — BP 114/64 | HR 50 | Ht 73.0 in | Wt 161.8 lb

## 2022-12-07 DIAGNOSIS — I639 Cerebral infarction, unspecified: Secondary | ICD-10-CM | POA: Diagnosis not present

## 2022-12-07 DIAGNOSIS — D582 Other hemoglobinopathies: Secondary | ICD-10-CM

## 2022-12-07 DIAGNOSIS — I4891 Unspecified atrial fibrillation: Secondary | ICD-10-CM

## 2022-12-07 DIAGNOSIS — I480 Paroxysmal atrial fibrillation: Secondary | ICD-10-CM | POA: Diagnosis not present

## 2022-12-07 DIAGNOSIS — E291 Testicular hypofunction: Secondary | ICD-10-CM | POA: Diagnosis not present

## 2022-12-07 LAB — CBC
HCT: 49 % (ref 39.0–52.0)
Hemoglobin: 17 g/dL (ref 13.0–17.0)
MCHC: 34.6 g/dL (ref 30.0–36.0)
MCV: 90.6 fl (ref 78.0–100.0)
Platelets: 154 10*3/uL (ref 150.0–400.0)
RBC: 5.41 Mil/uL (ref 4.22–5.81)
RDW: 13.3 % (ref 11.5–15.5)
WBC: 5.9 10*3/uL (ref 4.0–10.5)

## 2022-12-07 MED ORDER — DILTIAZEM HCL ER COATED BEADS 240 MG PO CP24: ORAL_CAPSULE | ORAL | 3 refills | Status: DC

## 2022-12-07 NOTE — Progress Notes (Signed)
Subjective:  Patient ID: Nicholas Keens., male    DOB: September 11, 1955  Age: 67 y.o. MRN: [id number]  CC:  Chief Complaint  Patient presents with   Follow-up    No concerns     HPI Nicholas West. presents for   Atrial fibrillation Followed by cardiology, Dr. [place], paroxysmal A-fib/flutter.  Eliquis for anticoagulation, diltiazem for rate control, avoiding NSAIDs. No new bleeding. Aware of episodes when occur. Few times per week.   History of CVA Prior subacute cortical infarct on MRI, chronic ischemic disease treated with Plavix, aspirin initially, now on Eliquis for A-fib anticoagulation as above.  Mild cognitive impairment discussed with neurology but no indication for medication for dementia.  Neuropsych eval October 2023 with mild neurocognitive disorder.  Plan for continued control of cardiovascular risk factors, follow-up visit in November of this year with neuro. Mild elevation of hemoglobin in January. No new memory changes.  Lab Results  Component Value Date   WBC 5.3 08/07/2022   HGB 17.3 (H) 08/07/2022   HCT 50.7 08/07/2022   MCV 90.3 08/07/2022   PLT 167.0 08/07/2022   Elevated hemoglobin Noted at Jan visit. On testosterone replacement with urology - no recent labs. Dr. Mena Goes - November of this year.   Hyperlipidemia: Treated with Crestor 10 mg daily.no new myalgias or side effects.  Lab Results  Component Value Date   CHOL 112 08/07/2022   HDL 48.20 08/07/2022   LDLCALC 52 08/07/2022   TRIG 61.0 08/07/2022   CHOLHDL 2 08/07/2022   Lab Results  Component Value Date   ALT 32 08/07/2022   AST 34 08/07/2022   ALKPHOS 53 08/07/2022   BILITOT 0.9 08/07/2022       History Patient Active Problem List   Diagnosis Date Noted   GERD (gastroesophageal reflux disease) 05/24/2022   Mild cognitive impairment of uncertain or unknown etiology 05/24/2022   Cerebrovascular disease    Lacunar infarction 09/2021   Hiatal hernia 08/02/2020   Paroxysmal  atrial fibrillation (HCC) 10/24/2012   Hypercholesterolemia 04/03/2012   Erectile dysfunction 03/05/2012   Male hypogonadism 03/05/2012   Past Medical History:  Diagnosis Date   Abdominal pain    BCC (basal cell carcinoma) 02/05/2013   Recurrent, top right scalp, MOHs   Cerebrovascular disease    Chest pain    negative stress echo in September 2013   Contusion of right elbow 10/11/2016   Diverticulitis    Dysplastic nevus    moderate, Left back no treatment   GERD (gastroesophageal reflux disease)    Hiatal hernia 08/02/2020   Hypercholesterolemia 04/03/2012   Laceration of hand without foreign body 10/11/2016   Lacunar infarction 10/10/2021   MRI - subtle area of subacute gyriform enhancement in the left occipital pole   Male hypogonadism 03/05/2012   Mild cognitive impairment of uncertain or unknown etiology 05/24/2022   Paroxysmal atrial fibrillation 10/24/2012   Past Surgical History:  Procedure Laterality Date   COLON SURGERY     PARTIAL COLECTOMY  11/11   Allergies  Allergen Reactions   Penicillins     rash   Prior to Admission medications   Medication Sig Start Date End Date Taking? Authorizing Provider  apixaban (ELIQUIS) 5 MG TABS tablet TAKE 1 TABLET(5 MG) BY MOUTH TWICE DAILY 11/07/22  Yes [place], Peter M, MD  B-D 3CC LUER-LOK SYR 23GX1" 23G X 1" 3 ML MISC SMARTSIG:1 Injection Once a Week 02/06/20  Yes [provider]  B-D HYPODERMIC NEEDLE 18GX1.5" 18G  X 1-1/2" MISC USE TO DRAW UP MEDICATION 04/01/20  Yes [provider]  calcium carbonate (OS-CAL - DOSED IN MG OF ELEMENTAL CALCIUM) 1250 MG tablet Take 2 tablets by mouth 2 (two) times daily with a meal.    Yes [provider]  cholecalciferol (VITAMIN D) 1000 UNITS tablet Take 1,000 Units by mouth 2 (two) times daily.   Yes [provider]  diltiazem (CARDIZEM CD) 240 MG 24 hr capsule TAKE 1 CAPSULE(240 MG) BY MOUTH DAILY 10/30/22  Yes [place], Peter M, MD  Docusate Sodium (COLACE  PO) Take 1 tablet by mouth as directed.   Yes [provider]  metroNIDAZOLE (METROCREAM) 0.75 % cream Apply 1 Application topically 2 (two) times daily. Hasn't started yet. 06/13/22  Yes [provider]  Multiple Vitamin (MULTIVITAMIN) tablet Take 1 tablet by mouth daily.   Yes [provider]  Omega-3 Fatty Acids (OMEGA 3 PO) Take 2 capsules by mouth daily.    Yes [provider]  omeprazole (PRILOSEC) 20 MG capsule Take 1 capsule (20 mg total) by mouth daily. 03/05/12  Yes Collene Gobble, MD  Probiotic Product (PROBIOTIC-10 PO) Take 1 tablet by mouth daily.   Yes [provider]  rosuvastatin (CRESTOR) 10 MG tablet TAKE 1 TABLET(10 MG) BY MOUTH DAILY 10/20/22  Yes [place], Peter M, MD  sildenafil (VIAGRA) 100 MG tablet Take 100 mg by mouth as needed. 08/30/21  Yes [provider]  testosterone cypionate (DEPOTESTOSTERONE CYPIONATE) 200 MG/ML injection Inject into the muscle once a week.    Yes [provider]   Social History   Socioeconomic History   Marital status: Married    Spouse name: Not on file   Number of children: 0   Years of education: 20   Highest education level: Professional school degree (e.g., MD, DDS, DVM, JD)  Occupational History   Occupation: attorney    Employer: [employer]  Tobacco Use   Smoking status: Never   Smokeless tobacco: Never  Vaping Use   Vaping Use: Never used  Substance and Sexual Activity   Alcohol use: No   Drug use: No   Sexual activity: Yes  Other Topics Concern   Not on file  Social History Narrative   Married   Exercise: Yes   Education: College   Right handed    Decaffeine    Two story home   Social Determinants of Health   Financial Resource Strain: Low Risk  (12/04/2022)   Overall Financial Resource Strain (CARDIA)    Difficulty of Paying Living Expenses: Not hard at all  Food Insecurity: No Food Insecurity (12/04/2022)   Hunger Vital Sign    Worried About Running  Out of Food in the Last Year: Never true    Ran Out of Food in the Last Year: Never true  Transportation Needs: No Transportation Needs (12/04/2022)   PRAPARE - Administrator, Civil Service (Medical): No    Lack of Transportation (Non-Medical): No  Physical Activity: Sufficiently Active (12/04/2022)   Exercise Vital Sign    Days of Exercise per Week: 6 days    Minutes of Exercise per Session: 60 min  Stress: No Stress Concern Present (12/04/2022)   [employer] of Occupational Health - Occupational Stress Questionnaire    Feeling of Stress : Not at all  Social Connections: Moderately Integrated (12/04/2022)   Social Connection and Isolation Panel [NHANES]    Frequency of Communication with Friends and Family: Once a week  Frequency of Social Gatherings with Friends and Family: Once a week    Attends Religious Services: More than 4 times per year    Active Member of [employer] or Organizations: Yes    Attends Engineer, structural: More than 4 times per year    Marital Status: Married  Catering manager Violence: Not At Risk (09/19/2022)   Humiliation, Afraid, Rape, and Kick questionnaire    Fear of Current or Ex-Partner: No    Emotionally Abused: No    Physically Abused: No    Sexually Abused: No    Review of Systems   Objective:   Vitals:   12/07/22 1029  BP: 112/64  Pulse: 65  Temp: 97.9 F (36.6 C)  TempSrc: Temporal  SpO2: 98%  Weight: 164 lb (74.4 kg)  Height: 6\' 1"  (1.854 m)     Physical Exam     Assessment & Plan:  Noble Hardman. is a 67 y.o. male . Elevated hemoglobin (HCC) - Plan: CBC  Hypogonadism male - Plan: CBC  Atrial fibrillation, unspecified type (HCC)  Mild elevated hemoglobin on most recent CBC.  Could be related to testosterone supplementation.  This has been managed by urology.  Repeat CBC and if still elevated recommend updated testosterone levels to see if adjustment needed tolerating current medications including for  paroxysmal A-fib.  Continue follow-up with specialist as planned.  No orders of the defined types were placed in this encounter.  Patient Instructions  Thanks for coming in today.  I will recheck the blood count but if the hemoglobin is still elevated it may be worthwhile checking your labs from urology to make sure the testosterone level is not too high.  Sometimes testosterone replacement can cause elevated hemoglobin. No change in other meds for now, follow-up with me in January for a physical but I am happy to see you sooner if needed.  Take care!    Signed,   Meredith Staggers, MD [facility], [facility] 12/07/22 10:49 AM

## 2022-12-07 NOTE — Patient Instructions (Signed)
Thanks for coming in today.  I will recheck the blood count but if the hemoglobin is still elevated it may be worthwhile checking your labs from urology to make sure the testosterone level is not too high.  Sometimes testosterone replacement can cause elevated hemoglobin. No change in other meds for now, follow-up with me in January for a physical but I am happy to see you sooner if needed.  Take care!

## 2022-12-07 NOTE — Patient Instructions (Signed)
Medication Instructions:  Continue same medications *If you need a refill on your cardiac medications before your next appointment, please call your pharmacy*   Lab Work: None ordered    Testing/Procedures: None ordered   Follow-Up: At [facility], you and your health needs are our priority.  As part of our continuing mission to provide you with exceptional heart care, we have created designated Provider Care Teams.  These Care Teams include your primary Cardiologist (physician) and Advanced Practice Providers (APPs -  Physician Assistants and Nurse Practitioners) who all work together to provide you with the care you need, when you need it.  We recommend signing up for the patient portal called "MyChart".  Sign up information is provided on this After Visit Summary.  MyChart is used to connect with patients for Virtual Visits (Telemedicine).  Patients are able to view lab/test results, encounter notes, upcoming appointments, etc.  Non-urgent messages can be sent to your provider as well.   To learn more about what you can do with MyChart, go to [email or web address].    Your next appointment:  6 months    Provider:  Dr.Jordan   

## 2022-12-11 DIAGNOSIS — C44319 Basal cell carcinoma of skin of other parts of face: Secondary | ICD-10-CM | POA: Diagnosis not present

## 2022-12-26 ENCOUNTER — Ambulatory Visit
Admission: EM | Admit: 2022-12-26 | Discharge: 2022-12-26 | Disposition: A | Discharge status: Home / Self Care | Payer: Medicare Other | Attending: Nurse Practitioner | Admitting: Nurse Practitioner

## 2022-12-26 ENCOUNTER — Other Ambulatory Visit: Payer: Self-pay

## 2022-12-26 DIAGNOSIS — R21 Rash and other nonspecific skin eruption: Secondary | ICD-10-CM | POA: Diagnosis not present

## 2022-12-26 MED ORDER — PREDNISONE 20 MG PO TABS: 40.0000 mg | ORAL_TABLET | Freq: Every day | ORAL | 0 refills | Status: AC

## 2022-12-26 NOTE — Discharge Instructions (Addendum)
Start prednisone daily for 5 days You may take Benadryl as needed for itching Please follow-up with your PCP if your symptoms do not improve Please go to the ER if you develop any worsening symptoms.  Includes but is not limited to worsening rash, fever or chills, swelling of your tongue/lip/throat, or any new concerns that arise

## 2022-12-26 NOTE — ED Triage Notes (Signed)
Pt presents with c/o a bilateral rash on arms X 1 day.  Denies fever.  Has not taken anything for relief.

## 2022-12-26 NOTE — ED Provider Notes (Addendum)
[facility]    CSN: [id number] Arrival date & time: 12/26/22  1759      History   Chief Complaint Chief Complaint  Patient presents with   Allergic Reaction    HPI Nicholas West. is a 67 y.o. male presents for evaluation of rash.  Patient reports 1 day of a red rash on his arms torso.  States it is slightly pruritic.  Denies any swelling, fevers, chills.  No tongue/throat/lip swelling, difficulty breathing or swallowing or shortness of breath.  Denies any new contacts including soaps, medications, shampoos, etc.  Denies any URI symptoms or sore throat.  No recent travel.  No history of eczema or psoriasis.  He has not taken any OTC medications for symptoms.  No other concerns at this time.   Allergic Reaction Presenting symptoms: rash     Past Medical History:  Diagnosis Date   Abdominal pain    BCC (basal cell carcinoma) 02/05/2013   Recurrent, top right scalp, MOHs   Cerebrovascular disease    Chest pain    negative stress echo in September 2013   Contusion of right elbow 10/11/2016   Diverticulitis    Dysplastic nevus    moderate, Left back no treatment   GERD (gastroesophageal reflux disease)    Hiatal hernia 08/02/2020   Hypercholesterolemia 04/03/2012   Laceration of hand without foreign body 10/11/2016   Lacunar infarction 10/10/2021   MRI - subtle area of subacute gyriform enhancement in the left occipital pole   Male hypogonadism 03/05/2012   Mild cognitive impairment of uncertain or unknown etiology 05/24/2022   Paroxysmal atrial fibrillation 10/24/2012    Patient Active Problem List   Diagnosis Date Noted   GERD (gastroesophageal reflux disease) 05/24/2022   Mild cognitive impairment of uncertain or unknown etiology 05/24/2022   Cerebrovascular disease    Lacunar infarction 09/2021   Hiatal hernia 08/02/2020   Paroxysmal atrial fibrillation (HCC) 10/24/2012   Hypercholesterolemia 04/03/2012   Erectile dysfunction 03/05/2012   Male  hypogonadism 03/05/2012    Past Surgical History:  Procedure Laterality Date   COLON SURGERY     PARTIAL COLECTOMY  11/11       Home Medications    Prior to Admission medications   Medication Sig Start Date End Date Taking? Authorizing Provider  predniSONE (DELTASONE) 20 MG tablet Take 2 tablets (40 mg total) by mouth daily with breakfast for 5 days. 12/26/22 12/31/22 Yes Radford Pax, NP  apixaban (ELIQUIS) 5 MG TABS tablet TAKE 1 TABLET(5 MG) BY MOUTH TWICE DAILY 11/07/22   [place], Peter M, MD  B-D 3CC LUER-LOK SYR 23GX1" 23G X 1" 3 ML MISC SMARTSIG:1 Injection Once a Week 02/06/20   [provider]  B-D HYPODERMIC NEEDLE 18GX1.5" 18G X 1-1/2" MISC USE TO DRAW UP MEDICATION 04/01/20   [provider]  calcium carbonate (OS-CAL - DOSED IN MG OF ELEMENTAL CALCIUM) 1250 MG tablet Take 2 tablets by mouth 2 (two) times daily with a meal.     [provider]  cholecalciferol (VITAMIN D) 1000 UNITS tablet Take 1,000 Units by mouth 2 (two) times daily.    [provider]  diltiazem (CARDIZEM CD) 240 MG 24 hr capsule TAKE 1 CAPSULE(240 MG) BY MOUTH DAILY 12/07/22   [place], Peter M, MD  Docusate Sodium (COLACE PO) Take 1 tablet by mouth as directed.    [provider]  metroNIDAZOLE (METROCREAM) 0.75 % cream Apply 1 Application topically 2 (two) times daily. Hasn't started yet.  06/13/22   [provider]  Multiple Vitamin (MULTIVITAMIN) tablet Take 1 tablet by mouth daily.    [provider]  Omega-3 Fatty Acids (OMEGA 3 PO) Take 2 capsules by mouth daily.     [provider]  omeprazole (PRILOSEC) 20 MG capsule Take 1 capsule (20 mg total) by mouth daily. 03/05/12   Collene Gobble, MD  Probiotic Product (PROBIOTIC-10 PO) Take 1 tablet by mouth daily.    [provider]  rosuvastatin (CRESTOR) 10 MG tablet TAKE 1 TABLET(10 MG) BY MOUTH DAILY 10/20/22   [place], Peter M, MD  sildenafil (VIAGRA) 100 MG tablet Take 100 mg by mouth  as needed. 08/30/21   [provider]  testosterone cypionate (DEPOTESTOSTERONE CYPIONATE) 200 MG/ML injection Inject into the muscle once a week.     [provider]    Family History Family History  Problem Relation Age of Onset   Cancer Mother    Alzheimer's disease Father        onset in mid-60s   Colon polyps Father    GER disease Father    Dementia Sister        unspecified type; rapidly progressing   Heart disease Paternal Grandmother    Cancer Paternal Grandfather     Social History Social History   Tobacco Use   Smoking status: Never   Smokeless tobacco: Never  Vaping Use   Vaping Use: Never used  Substance Use Topics   Alcohol use: No   Drug use: No     Allergies   Penicillins   Review of Systems Review of Systems  Skin:  Positive for rash.     Physical Exam Triage Vital Signs ED Triage Vitals [12/26/22 1858]  Enc Vitals Group     BP 129/78     Pulse Rate (!) 58     Resp 18     Temp (!) 96.5 F (35.8 C)     Temp Source Oral     SpO2 98 %     Weight      Height      Head Circumference      Peak Flow      Pain Score 0     Pain Loc      Pain Edu?      Excl. in GC?    No data found.  Updated Vital Signs BP 129/78 (BP Location: Left Arm)   Pulse (!) 58   Temp (!) 96.5 F (35.8 C) (Oral)   Resp 18   SpO2 98%   Visual Acuity Right Eye Distance:   Left Eye Distance:   Bilateral Distance:    Right Eye Near:   Left Eye Near:    Bilateral Near:     Physical Exam Vitals and nursing note reviewed.  Constitutional:      General: He is not in acute distress.    Appearance: Normal appearance. He is not ill-appearing.  HENT:     Head: Normocephalic and atraumatic.     Comments: No facial swelling    Mouth/Throat:     Lips: Pink.     Mouth: Mucous membranes are moist.     Pharynx: Oropharynx is clear. No pharyngeal swelling or uvula swelling.  Eyes:     Pupils: Pupils are equal, round, and reactive to light.   Cardiovascular:     Rate and Rhythm: Bradycardia present.     Comments: Bradycardia at 58 Skin:    General: Skin is warm and dry.  Findings: Rash present. Rash is macular and papular.     Comments: Scattered macular papular rash on arms torso and neck.  Neurological:     General: No focal deficit present.     Mental Status: He is alert and oriented to person, place, and time.  Psychiatric:        Mood and Affect: Mood normal.        Behavior: Behavior normal.      UC Treatments / Results  Labs (all labs ordered are listed, but only abnormal results are displayed) Labs Reviewed - No data to display   Comprehensive metabolic panel Order: [id number] Status: Final result     Visible to patient: Yes (not seen)     Next appt: 06/15/2023 at 11:30 AM in Neurology Marlowe Kays, PA-C)     Dx: History of CVA (cerebrovascular accid...   1 Result Note     1 Patient Communication          Component Ref Range & Units 4 mo ago (08/07/22) 10 mo ago (02/10/22) 10 mo ago (02/10/22) 1 yr ago (12/09/21) 1 yr ago (11/25/21) 1 yr ago (09/16/21) 1 yr ago (08/04/21)  Sodium 135 - 145 mEq/L 141  141 R 140 136 139 139  Potassium 3.5 - 5.1 mEq/L 4.0  4.7 R 4.1 3.7 4.3 4.6  Chloride 96 - 112 mEq/L 103  103 R 102 98 102 101  CO2 19 - 32 mEq/L 31  27 R 30 30 34 High  32  Glucose, Bld 70 - 99 mg/dL 83  83 80 73 81 78  BUN 6 - 23 mg/dL 13  15 R 16 11 12 13   Creatinine, Ser 0.40 - 1.50 mg/dL 1.91  4.78 R 2.95 6.21 0.90 1.01  Total Bilirubin 0.2 - 1.2 mg/dL 0.9 0.8 R  0.9 1.2 0.8 0.8  Alkaline Phosphatase 39 - 117 U/L 53 79 R  62 59 59 61  AST 0 - 37 U/L 34 34 R  34 27 36 34  ALT 0 - 53 U/L 32 37 R  36 27 42 40  Total Protein 6.0 - 8.3 g/dL 7.3 7.3 R  7.4 7.0 7.1 7.4  Albumin 3.5 - 5.2 g/dL 4.5 4.7 R, CM  4.6 4.5 4.6 4.3  GFR >60.00 mL/min 88.63   67.31 CM 86.16 CM 88.59 CM 77.20 CM  Comment: Calculated using the CKD-EPI Creatinine Equation (2021)  Calcium 8.4 - 10.5 mg/dL 9.6   9.7 R 9.9 9.2 9.6 9.6  Resulting Agency [place] [facility] [place] [facility] [place] [facility] [place] [facility] [place] [facility]             EKG   Radiology No results found.  Procedures Procedures (including critical care time)  Medications Ordered in UC Medications - No data to display  Initial Impression / Assessment and Plan / UC Course  I have reviewed the triage vital signs and the nursing notes.  Pertinent labs & imaging results that were available during my care of the patient were reviewed by me and considered in my medical decision making (see chart for details).     Reviewed exam and symptoms with patient.  No red flags Unclear cause of rash, given widespread distribution start prednisone May use over-the-counter Benadryl as needed for itching PCP follow-up if symptoms do not improve ER precautions reviewed and patient verbalized understanding Final Clinical Impressions(s) / UC Diagnoses   Final diagnoses:  Rash and nonspecific skin eruption     Discharge Instructions  Start prednisone daily for 5 days You may take Benadryl as needed for itching Please follow-up with your PCP if your symptoms do not improve Please go to the ER if you develop any worsening symptoms.  Includes but is not limited to worsening rash, fever or chills, swelling of your tongue/lip/throat, or any new concerns that arise   ED Prescriptions     Medication Sig Dispense Auth. Provider   predniSONE (DELTASONE) 20 MG tablet Take 2 tablets (40 mg total) by mouth daily with breakfast for 5 days. 10 tablet Radford Pax, NP      PDMP not reviewed this encounter.   Radford Pax, NP 12/26/22 1921    Radford Pax, NP 12/26/22 1921

## 2023-01-01 ENCOUNTER — Telehealth: Payer: Self-pay | Admitting: Family Medicine

## 2023-01-01 ENCOUNTER — Emergency Department (HOSPITAL_BASED_OUTPATIENT_CLINIC_OR_DEPARTMENT_OTHER)
Admission: EM | Admit: 2023-01-01 | Discharge: 2023-01-01 | Disposition: A | Discharge status: Home / Self Care | Payer: Medicare Other | Attending: Emergency Medicine | Admitting: Emergency Medicine

## 2023-01-01 ENCOUNTER — Other Ambulatory Visit: Payer: Self-pay

## 2023-01-01 ENCOUNTER — Encounter (HOSPITAL_BASED_OUTPATIENT_CLINIC_OR_DEPARTMENT_OTHER): Payer: Self-pay | Admitting: Emergency Medicine

## 2023-01-01 DIAGNOSIS — L519 Erythema multiforme, unspecified: Secondary | ICD-10-CM | POA: Diagnosis not present

## 2023-01-01 DIAGNOSIS — Z7901 Long term (current) use of anticoagulants: Secondary | ICD-10-CM | POA: Diagnosis not present

## 2023-01-01 DIAGNOSIS — R21 Rash and other nonspecific skin eruption: Secondary | ICD-10-CM | POA: Insufficient documentation

## 2023-01-01 DIAGNOSIS — Z85828 Personal history of other malignant neoplasm of skin: Secondary | ICD-10-CM | POA: Insufficient documentation

## 2023-01-01 DIAGNOSIS — L270 Generalized skin eruption due to drugs and medicaments taken internally: Secondary | ICD-10-CM

## 2023-01-01 LAB — CBC
HCT: 47.6 % (ref 39.0–52.0)
Hemoglobin: 16.5 g/dL (ref 13.0–17.0)
MCH: 31 pg (ref 26.0–34.0)
MCHC: 34.7 g/dL (ref 30.0–36.0)
MCV: 89.5 fL (ref 80.0–100.0)
Platelets: 159 10*3/uL (ref 150–400)
RBC: 5.32 MIL/uL (ref 4.22–5.81)
RDW: 12.1 % (ref 11.5–15.5)
WBC: 10.2 10*3/uL (ref 4.0–10.5)
nRBC: 0 % (ref 0.0–0.2)

## 2023-01-01 LAB — COMPREHENSIVE METABOLIC PANEL
ALT: 37 U/L (ref 0–44)
AST: 27 U/L (ref 15–41)
Albumin: 4.3 g/dL (ref 3.5–5.0)
Alkaline Phosphatase: 45 U/L (ref 38–126)
Anion gap: 6 (ref 5–15)
BUN: 18 mg/dL (ref 8–23)
CO2: 33 mmol/L — ABNORMAL HIGH (ref 22–32)
Calcium: 9.6 mg/dL (ref 8.9–10.3)
Chloride: 103 mmol/L (ref 98–111)
Creatinine, Ser: 0.85 mg/dL (ref 0.61–1.24)
GFR, Estimated: >60 mL/min (ref >60)
Glucose, Bld: 64 mg/dL — ABNORMAL LOW (ref 70–99)
Potassium: 3.5 mmol/L (ref 3.5–5.1)
Sodium: 142 mmol/L (ref 135–145)
Total Bilirubin: 0.7 mg/dL (ref 0.3–1.2)
Total Protein: 6.7 g/dL (ref 6.5–8.1)

## 2023-01-01 LAB — DIFFERENTIAL
Abs Immature Granulocytes: 0.09 10*3/uL — ABNORMAL HIGH (ref 0.00–0.07)
Basophils Absolute: 0 10*3/uL (ref 0.0–0.1)
Basophils Relative: 0 %
Eosinophils Absolute: 0.1 10*3/uL (ref 0.0–0.5)
Eosinophils Relative: 1 %
Immature Granulocytes: 1 %
Lymphocytes Relative: 15 %
Lymphs Abs: 1.5 10*3/uL (ref 0.7–4.0)
Monocytes Absolute: 1.3 10*3/uL — ABNORMAL HIGH (ref 0.1–1.0)
Monocytes Relative: 13 %
Neutro Abs: 7 10*3/uL (ref 1.7–7.7)
Neutrophils Relative %: 70 %

## 2023-01-01 NOTE — Telephone Encounter (Signed)
Noted  

## 2023-01-01 NOTE — ED Provider Notes (Signed)
[place] EMERGENCY DEPARTMENT AT [facility] Provider Note   CSN: [id number] Arrival date & time: 01/01/23  1110     History  Chief Complaint  Patient presents with   Rash    Nicholas Keens. is a 67 y.o. male who presents emerged the department with a chief complaint of rash.  Patient states that he developed a rash on his arms first but covering his upper body face chest legs feet palms and soles.  It is very itchy over his back.  He saw someone at an urgent care who put him on a steroid taper and he thinks has had some improvement in his upper extremities but has had new development of rash over his lower extremities.  He is married and in a relationship with his wife and does not have any concerns about STIs.  He did take amoxicillin on the 13th and had a 1 week course ending on the 20th.  He reports having a history of penicillin allergy but took the amoxicillin for a Mohs surgery of a basal cell carcinoma on his face.   Rash      Home Medications Prior to Admission medications   Medication Sig Start Date End Date Taking? Authorizing Provider  apixaban (ELIQUIS) 5 MG TABS tablet TAKE 1 TABLET(5 MG) BY MOUTH TWICE DAILY 11/07/22   [place], Peter M, MD  B-D 3CC LUER-LOK SYR 23GX1" 23G X 1" 3 ML MISC SMARTSIG:1 Injection Once a Week 02/06/20   [provider]  B-D HYPODERMIC NEEDLE 18GX1.5" 18G X 1-1/2" MISC USE TO DRAW UP MEDICATION 04/01/20   [provider]  calcium carbonate (OS-CAL - DOSED IN MG OF ELEMENTAL CALCIUM) 1250 MG tablet Take 2 tablets by mouth 2 (two) times daily with a meal.     [provider]  cholecalciferol (VITAMIN D) 1000 UNITS tablet Take 1,000 Units by mouth 2 (two) times daily.    [provider]  diltiazem (CARDIZEM CD) 240 MG 24 hr capsule TAKE 1 CAPSULE(240 MG) BY MOUTH DAILY 12/07/22   [place], Peter M, MD  Docusate Sodium (COLACE PO) Take 1 tablet by mouth as directed.    [provider]  metroNIDAZOLE  (METROCREAM) 0.75 % cream Apply 1 Application topically 2 (two) times daily. Hasn't started yet. 06/13/22   [provider]  Multiple Vitamin (MULTIVITAMIN) tablet Take 1 tablet by mouth daily.    [provider]  Omega-3 Fatty Acids (OMEGA 3 PO) Take 2 capsules by mouth daily.     [provider]  omeprazole (PRILOSEC) 20 MG capsule Take 1 capsule (20 mg total) by mouth daily. 03/05/12   Collene Gobble, MD  Probiotic Product (PROBIOTIC-10 PO) Take 1 tablet by mouth daily.    [provider]  rosuvastatin (CRESTOR) 10 MG tablet TAKE 1 TABLET(10 MG) BY MOUTH DAILY 10/20/22   [place], Peter M, MD  sildenafil (VIAGRA) 100 MG tablet Take 100 mg by mouth as needed. 08/30/21   [provider]  testosterone cypionate (DEPOTESTOSTERONE CYPIONATE) 200 MG/ML injection Inject into the muscle once a week.     [provider]      Allergies    Penicillins    Review of Systems   Review of Systems  Skin:  Positive for rash.    Physical Exam Updated Vital Signs BP (!) 139/94 (BP Location: Right Arm)   Pulse 67   Temp 98.4 F (36.9 C) (Oral)   Resp 15   Ht 6\' 1"  (1.854  m)   Wt 72.6 kg   SpO2 100%   BMI 21.11 kg/m  Physical Exam              ED Results / Procedures / Treatments   Labs (all labs ordered are listed, but only abnormal results are displayed) Labs Reviewed - No data to display  EKG None  Radiology No results found.  Procedures Procedures    Medications Ordered in ED Medications - No data to display  ED Course/ Medical Decision Making/ A&P Clinical Course as of 01/01/23 1441  Mon Jan 01, 2023  1417 Glucose(!): 64 [AH]    Clinical Course User Index [AH] Arthor Captain, PA-C                             Medical Decision Making Amount and/or Complexity of Data Reviewed Labs: ordered. Decision-making details documented in ED Course.   67 year old male who presents to the emergency department with  generalized rash after taking amoxicillin a week ago.  I believe this is likely a delayed hypersensitivity reaction to the drug.  I reviewed the patient's labs.  He has no eosinophilia suggestive of DRESS.  Remainder of labs are unremarkable. Patient does have symptoms that are actually consistent with erythema multiforme.  He does have an RPR pending given his palmar and plantar involvement.  The the rash is blanchable and does not appear petechial I have extremely low suspicion for RMSF and patient has not been having any fever or chills.  He does not wish to continue with oral steroids but does have a dermatologist and will follow closely with him otherwise he may use symptomatic treatment, over-the-counter hydrocortisone lotion.  Patient appears otherwise appropriate for discharge at this time.        Final Clinical Impression(s) / ED Diagnoses Final diagnoses:  None    Rx / DC Orders ED Discharge Orders     None         Arthor Captain, PA-C 01/01/23 1901    Rondel Baton, MD 01/04/23 1025

## 2023-01-01 NOTE — Discharge Instructions (Addendum)
Contact a health care provider if: Your rash comes back. You have a fever. You develop redness, swelling, or a burning feeling on your lips or in your mouth. You develop blisters on your skin or open sores on your mouth, lips, vagina, penis, or anus. You have blood in your urine. You have pain when you urinate. You are unable to eat or drink. Get help right away if: You have changes in your vision, or you have eye symptoms, such as: Eye pain. Redness or lesions around your eye. Fluid draining from your eye. You have difficulty swallowing. You start drooling.

## 2023-01-01 NOTE — Telephone Encounter (Signed)
I have left  the pt a VM asking him to return our call

## 2023-01-01 NOTE — Telephone Encounter (Signed)
[facility] they sent him to ED.

## 2023-01-01 NOTE — ED Triage Notes (Signed)
Pt arrives to ED with c/o generalized rash over arms, legs, back, and torso x1 week. Was seen by UC and given prednisone w/o relief.

## 2023-01-01 NOTE — Telephone Encounter (Addendum)
Caller name: Jamikal Hommes.  On DPR?: Yes  Call back number: [phone] (mobile)  Provider they see: Shade Flood, MD  Reason for call:   Pt called stating he had an allergic reaction and was red all over- sent him to Triage.     Will call pt and check on his status ASAP

## 2023-01-02 DIAGNOSIS — L270 Generalized skin eruption due to drugs and medicaments taken internally: Secondary | ICD-10-CM | POA: Diagnosis not present

## 2023-01-02 DIAGNOSIS — Z85828 Personal history of other malignant neoplasm of skin: Secondary | ICD-10-CM | POA: Diagnosis not present

## 2023-01-02 LAB — RPR: RPR Ser Ql: NONREACTIVE

## 2023-01-02 NOTE — Telephone Encounter (Signed)
Patient seeing [facility] this afternoon declined follow up at this time states he will call if he needs [place]

## 2023-01-05 ENCOUNTER — Telehealth: Payer: Self-pay

## 2023-01-05 NOTE — Telephone Encounter (Signed)
Transition Care Management Follow-up Telephone Call Date of discharge and from where: 01/01/2023 [facility] How have you been since you were released from the hospital? Patient is feeling better. Any questions or concerns? No  Items Reviewed: Did the pt receive and understand the discharge instructions provided? Yes  Medications obtained and verified? Yes  Other?  Patient stated he was very pleased with the care he received. Any new allergies since your discharge?  Doxycycline Dietary orders reviewed? Yes Do you have support at home? Yes   Follow up appointments reviewed:  PCP Hospital f/u appt confirmed? No  Scheduled to see  on  @ . Specialist Hospital f/u appt confirmed?  Patient saw his dermatologist  Scheduled to see  on  @ . Are transportation arrangements needed? No  If their condition worsens, is the pt aware to call PCP or go to the Emergency Dept.? Yes Was the patient provided with contact information for the PCP's office or ED? Yes Was to pt encouraged to call back with questions or concerns? Yes  Darriona Dehaas [facility]  [facility] Population Health Community Resource Care Guide   [email or web address]  ?? [phone]   Website: [email or web address]  [email or web address]

## 2023-01-05 NOTE — Telephone Encounter (Signed)
I called pt and spoke with hm in regards to this matter . He states he seen Dermatology gave him Doxycyline and that Is when the allergic reaction started .   He states he is allergic to PCN 's since childhood but is unsure as an adult has not had PCN since .

## 2023-01-05 NOTE — Telephone Encounter (Signed)
Please clarify last message, it looks like he was seen for allergy to amoxicillin, I do not see doxycycline.

## 2023-02-21 ENCOUNTER — Encounter: Payer: Self-pay | Admitting: Family Medicine

## 2023-02-21 ENCOUNTER — Ambulatory Visit: Payer: Medicare Other | Admitting: Family Medicine

## 2023-02-21 VITALS — BP 126/72 | HR 54 | Temp 98.1°F | Ht 73.0 in | Wt 160.6 lb

## 2023-02-21 DIAGNOSIS — L989 Disorder of the skin and subcutaneous tissue, unspecified: Secondary | ICD-10-CM | POA: Diagnosis not present

## 2023-02-21 DIAGNOSIS — Z872 Personal history of diseases of the skin and subcutaneous tissue: Secondary | ICD-10-CM

## 2023-02-21 NOTE — Patient Instructions (Signed)
Bump on the side of the neck appears to be primarily associated with the skin, possible skin cyst or possible insect bite of some type given how quickly it is improving in size.  I do not feel any definite swollen lymph nodes in that area.  I think it is reasonable to continue to monitor for improvement over the next 1 week.  If the area is not continue to improve in size, or any new/worsening symptoms, let me know right away and I will check an ultrasound and possibly have you see dermatology.  Keep me posted and thanks for coming in today.

## 2023-02-21 NOTE — Progress Notes (Signed)
Subjective:  Patient ID: Nicholas West., male    DOB: 25-Feb-1956  Age: 67 y.o. MRN: [id number]  CC:  Chief Complaint  Patient presents with   Mass    Rt side of neck hard and there is a visible no pain with palpation, no sore throat or pressure through neck, pt reports no issues breathing     HPI Nicholas West. presents for   R neck mass: Knot/lump on R side of neck below ear. Noticed 1 week ago. Here with spouse. Not painful. More firm, but 1/2 the size today compared to last week.  No sore throat, difficulty swallowing. No ear pain, no scalp rash/wounds.  Feels ok otherwise. No f/c.   Erythema multiforme He was seen in the ER in June for erythema multiforme, after initial evaluation in urgent care, steroid taper for rash initially.  Had taken doxycycline previously (clarified that he took doxycycline, not amoxicillin prior to rash).  Thought to be a delayed hypersensitivity reaction to that drug.  Plan for follow-up with his dermatologist.  Over-the-counter hydrocortisone lotion at that time. Rash took awhile to go away, but has resolved and no recurrence.     History Patient Active Problem List   Diagnosis Date Noted   GERD (gastroesophageal reflux disease) 05/24/2022   Mild cognitive impairment of uncertain or unknown etiology 05/24/2022   Cerebrovascular disease    Lacunar infarction 09/2021   Hiatal hernia 08/02/2020   Paroxysmal atrial fibrillation (HCC) 10/24/2012   Hypercholesterolemia 04/03/2012   Erectile dysfunction 03/05/2012   Male hypogonadism 03/05/2012   Past Medical History:  Diagnosis Date   Abdominal pain    BCC (basal cell carcinoma) 02/05/2013   Recurrent, top right scalp, MOHs   Cerebrovascular disease    Chest pain    negative stress echo in September 2013   Contusion of right elbow 10/11/2016   Diverticulitis    Dysplastic nevus    moderate, Left back no treatment   GERD (gastroesophageal reflux disease)    Hiatal hernia 08/02/2020    Hypercholesterolemia 04/03/2012   Laceration of hand without foreign body 10/11/2016   Lacunar infarction 10/10/2021   MRI - subtle area of subacute gyriform enhancement in the left occipital pole   Male hypogonadism 03/05/2012   Mild cognitive impairment of uncertain or unknown etiology 05/24/2022   Paroxysmal atrial fibrillation 10/24/2012   Past Surgical History:  Procedure Laterality Date   COLON SURGERY     PARTIAL COLECTOMY  11/11   Allergies  Allergen Reactions   Doxycycline Rash   Penicillins     rash   Prior to Admission medications   Medication Sig Start Date End Date Taking? Authorizing Provider  apixaban (ELIQUIS) 5 MG TABS tablet TAKE 1 TABLET(5 MG) BY MOUTH TWICE DAILY 11/07/22  Yes [place], Peter M, MD  B-D 3CC LUER-LOK SYR 23GX1" 23G X 1" 3 ML MISC SMARTSIG:1 Injection Once a Week 02/06/20  Yes [provider]  B-D HYPODERMIC NEEDLE 18GX1.5" 18G X 1-1/2" MISC USE TO DRAW UP MEDICATION 04/01/20  Yes [provider]  calcium carbonate (OS-CAL - DOSED IN MG OF ELEMENTAL CALCIUM) 1250 MG tablet Take 2 tablets by mouth 2 (two) times daily with a meal.    Yes [provider]  cholecalciferol (VITAMIN D) 1000 UNITS tablet Take 1,000 Units by mouth 2 (two) times daily.   Yes [provider]  diltiazem (CARDIZEM CD) 240 MG 24 hr capsule TAKE 1 CAPSULE(240 MG) BY MOUTH DAILY 12/07/22  Yes [place], Peter M, MD  Docusate Sodium (COLACE PO) Take 1 tablet by mouth as directed.   Yes [provider]  metroNIDAZOLE (METROCREAM) 0.75 % cream Apply 1 Application topically 2 (two) times daily. Hasn't started yet. 06/13/22  Yes [provider]  Multiple Vitamin (MULTIVITAMIN) tablet Take 1 tablet by mouth daily.   Yes [provider]  Omega-3 Fatty Acids (OMEGA 3 PO) Take 2 capsules by mouth daily.    Yes [provider]  omeprazole (PRILOSEC) 20 MG capsule Take 1 capsule (20 mg total) by mouth daily. 03/05/12  Yes Collene Gobble, MD  Probiotic Product (PROBIOTIC-10 PO) Take 1 tablet by mouth daily.   Yes [provider]  rosuvastatin (CRESTOR) 10 MG tablet TAKE 1 TABLET(10 MG) BY MOUTH DAILY 10/20/22  Yes [place], Peter M, MD  sildenafil (VIAGRA) 100 MG tablet Take 100 mg by mouth as needed. 08/30/21  Yes [provider]  testosterone cypionate (DEPOTESTOSTERONE CYPIONATE) 200 MG/ML injection Inject into the muscle once a week.    Yes [provider]   Social History   Socioeconomic History   Marital status: Married    Spouse name: Not on file   Number of children: 0   Years of education: 67   Highest education level: Professional school degree (e.g., MD, DDS, DVM, JD)  Occupational History   Occupation: attorney    Employer: [employer]  Tobacco Use   Smoking status: Never   Smokeless tobacco: Never  Vaping Use   Vaping status: Never Used  Substance and Sexual Activity   Alcohol use: No   Drug use: No   Sexual activity: Yes  Other Topics Concern   Not on file  Social History Narrative   Married   Exercise: Yes   Education: College   Right handed    Decaffeine    Two story home   Social Determinants of Health   Financial Resource Strain: Low Risk  (12/04/2022)   Overall Financial Resource Strain (CARDIA)    Difficulty of Paying Living Expenses: Not hard at all  Food Insecurity: No Food Insecurity (12/04/2022)   Hunger Vital Sign    Worried About Running Out of Food in the Last Year: Never true    Ran Out of Food in the Last Year: Never true  Transportation Needs: No Transportation Needs (12/04/2022)   PRAPARE - Administrator, Civil Service (Medical): No    Lack of Transportation (Non-Medical): No  Physical Activity: Sufficiently Active (12/04/2022)   Exercise Vital Sign    Days of Exercise per Week: 6 days    Minutes of Exercise per Session: 60 min  Stress: No Stress Concern Present (12/04/2022)   [employer] of Occupational Health - Occupational  Stress Questionnaire    Feeling of Stress : Not at all  Social Connections: Moderately Integrated (12/04/2022)   Social Connection and Isolation Panel [NHANES]    Frequency of Communication with Friends and Family: Once a week    Frequency of Social Gatherings with Friends and Family: Once a week    Attends Religious Services: More than 4 times per year    Active Member of [employer] or Organizations: Yes    Attends Banker Meetings: More than 4 times per year    Marital Status: Married  Catering manager Violence: Not At Risk (09/19/2022)   Humiliation, Afraid, Rape, and Kick questionnaire    Fear of Current or Ex-Partner: No    Emotionally Abused: No  Physically Abused: No    Sexually Abused: No    Review of Systems Per HPI.   Objective:   Vitals:   02/21/23 0823  BP: 126/72  Pulse: (!) 54  Temp: 98.1 F (36.7 C)  TempSrc: Temporal  SpO2: 99%  Weight: 160 lb 9.6 oz (72.8 kg)  Height: 6\' 1"  (1.854 m)     Physical Exam Vitals reviewed.  Constitutional:      General: He is not in acute distress.    Appearance: Normal appearance. He is well-developed.  HENT:     Head: Normocephalic and atraumatic.  Neck:     Thyroid: No thyroid mass or thyroid tenderness.   Cardiovascular:     Rate and Rhythm: Normal rate.  Pulmonary:     Effort: Pulmonary effort is normal.  Musculoskeletal:     Cervical back: Normal range of motion and neck supple.  Lymphadenopathy:     Cervical:     Right cervical: No superficial, deep or posterior cervical adenopathy.    Left cervical: No superficial, deep or posterior cervical adenopathy.  Neurological:     Mental Status: He is alert and oriented to person, place, and time.  Psychiatric:        Mood and Affect: Mood normal.        Assessment & Plan:  Nicholas West. is a 67 y.o. male . Skin lesion of neck  -Possible insect bite versus other skin lesion, does not appear to be deep space or lymphadenopathy.  Reassuring  with significant improvement in size over the course of 1 week.  Continue to monitor for now with option of ultrasound if not continue to resolve over the next week, with RTC precautions if new or worsening symptoms.  If persistent could also have evaluation with dermatology.  History of erythema multiforme  -Allergy to doxycycline noted, rash resolved, no recurrence, RTC precautions.  No orders of the defined types were placed in this encounter.  Patient Instructions  Bump on the side of the neck appears to be primarily associated with the skin, possible skin cyst or possible insect bite of some type given how quickly it is improving in size.  I do not feel any definite swollen lymph nodes in that area.  I think it is reasonable to continue to monitor for improvement over the next 1 week.  If the area is not continue to improve in size, or any new/worsening symptoms, let me know right away and I will check an ultrasound and possibly have you see dermatology.  Keep me posted and thanks for coming in today.    Signed,   Meredith Staggers, MD [facility], [facility] 02/21/23 9:01 AM

## 2023-02-27 DIAGNOSIS — K080 Exfoliation of teeth due to systemic causes: Secondary | ICD-10-CM | POA: Diagnosis not present

## 2023-03-21 ENCOUNTER — Other Ambulatory Visit: Payer: Self-pay | Admitting: Cardiology

## 2023-05-01 ENCOUNTER — Other Ambulatory Visit (HOSPITAL_BASED_OUTPATIENT_CLINIC_OR_DEPARTMENT_OTHER): Payer: Self-pay

## 2023-05-01 MED ORDER — COMIRNATY 30 MCG/0.3ML IM SUSY
PREFILLED_SYRINGE | INTRAMUSCULAR | 0 refills | Status: DC
  Filled 2023-05-01: qty 0.3, 1d supply, fill #0

## 2023-05-04 ENCOUNTER — Other Ambulatory Visit: Payer: Self-pay | Admitting: Cardiology

## 2023-05-04 DIAGNOSIS — I480 Paroxysmal atrial fibrillation: Secondary | ICD-10-CM

## 2023-05-04 NOTE — Telephone Encounter (Signed)
Prescription refill request for Eliquis received. Indication:afib Last office visit:5/24 Scr:0.85  6/24 Age: 67 Weight:72.8  kg  Prescription refilled

## 2023-05-11 ENCOUNTER — Other Ambulatory Visit (HOSPITAL_BASED_OUTPATIENT_CLINIC_OR_DEPARTMENT_OTHER): Payer: Self-pay

## 2023-05-11 MED ORDER — INFLUENZA VAC A&B SURF ANT ADJ 0.5 ML IM SUSY
0.5000 mL | PREFILLED_SYRINGE | Freq: Once | INTRAMUSCULAR | 0 refills | Status: AC
  Filled 2023-05-11: qty 0.5, 1d supply, fill #0

## 2023-06-10 ENCOUNTER — Emergency Department (HOSPITAL_BASED_OUTPATIENT_CLINIC_OR_DEPARTMENT_OTHER): Payer: Medicare Other | Admitting: Radiology

## 2023-06-10 ENCOUNTER — Encounter (HOSPITAL_BASED_OUTPATIENT_CLINIC_OR_DEPARTMENT_OTHER): Payer: Self-pay

## 2023-06-10 ENCOUNTER — Emergency Department (HOSPITAL_BASED_OUTPATIENT_CLINIC_OR_DEPARTMENT_OTHER)
Admission: EM | Admit: 2023-06-10 | Discharge: 2023-06-10 | Disposition: A | Discharge status: Home / Self Care | Payer: Medicare Other | Attending: Emergency Medicine | Admitting: Emergency Medicine

## 2023-06-10 DIAGNOSIS — R531 Weakness: Secondary | ICD-10-CM | POA: Diagnosis not present

## 2023-06-10 DIAGNOSIS — Z1152 Encounter for screening for COVID-19: Secondary | ICD-10-CM | POA: Insufficient documentation

## 2023-06-10 DIAGNOSIS — Z7901 Long term (current) use of anticoagulants: Secondary | ICD-10-CM | POA: Diagnosis not present

## 2023-06-10 DIAGNOSIS — I4891 Unspecified atrial fibrillation: Secondary | ICD-10-CM | POA: Insufficient documentation

## 2023-06-10 DIAGNOSIS — R6883 Chills (without fever): Secondary | ICD-10-CM | POA: Diagnosis not present

## 2023-06-10 DIAGNOSIS — R4182 Altered mental status, unspecified: Secondary | ICD-10-CM | POA: Insufficient documentation

## 2023-06-10 DIAGNOSIS — B9689 Other specified bacterial agents as the cause of diseases classified elsewhere: Secondary | ICD-10-CM | POA: Diagnosis not present

## 2023-06-10 DIAGNOSIS — Z20822 Contact with and (suspected) exposure to covid-19: Secondary | ICD-10-CM | POA: Insufficient documentation

## 2023-06-10 DIAGNOSIS — N390 Urinary tract infection, site not specified: Secondary | ICD-10-CM

## 2023-06-10 DIAGNOSIS — R42 Dizziness and giddiness: Secondary | ICD-10-CM | POA: Diagnosis not present

## 2023-06-10 DIAGNOSIS — Z79899 Other long term (current) drug therapy: Secondary | ICD-10-CM | POA: Insufficient documentation

## 2023-06-10 DIAGNOSIS — R9431 Abnormal electrocardiogram [ECG] [EKG]: Secondary | ICD-10-CM | POA: Diagnosis not present

## 2023-06-10 DIAGNOSIS — R509 Fever, unspecified: Secondary | ICD-10-CM | POA: Insufficient documentation

## 2023-06-10 LAB — URINALYSIS, ROUTINE W REFLEX MICROSCOPIC
Bilirubin Urine: NEGATIVE
Glucose, UA: NEGATIVE mg/dL
Ketones, ur: NEGATIVE mg/dL
Nitrite: POSITIVE — AB
Specific Gravity, Urine: 1.015 (ref 1.005–1.030)
WBC, UA: >50 WBC/hpf (ref 0–5)
pH: 6 (ref 5.0–8.0)

## 2023-06-10 LAB — CBC
HCT: 45.5 % (ref 39.0–52.0)
Hemoglobin: 16 g/dL (ref 13.0–17.0)
MCH: 31.1 pg (ref 26.0–34.0)
MCHC: 35.2 g/dL (ref 30.0–36.0)
MCV: 88.3 fL (ref 80.0–100.0)
Platelets: 124 10*3/uL — ABNORMAL LOW (ref 150–400)
RBC: 5.15 MIL/uL (ref 4.22–5.81)
RDW: 12.4 % (ref 11.5–15.5)
WBC: 14.4 10*3/uL — ABNORMAL HIGH (ref 4.0–10.5)
nRBC: 0 % (ref 0.0–0.2)

## 2023-06-10 LAB — BASIC METABOLIC PANEL
Anion gap: 6 (ref 5–15)
BUN: 12 mg/dL (ref 8–23)
CO2: 29 mmol/L (ref 22–32)
Calcium: 9.3 mg/dL (ref 8.9–10.3)
Chloride: 103 mmol/L (ref 98–111)
Creatinine, Ser: 0.92 mg/dL (ref 0.61–1.24)
GFR, Estimated: >60 mL/min (ref >60)
Glucose, Bld: 103 mg/dL — ABNORMAL HIGH (ref 70–99)
Potassium: 3.6 mmol/L (ref 3.5–5.1)
Sodium: 138 mmol/L (ref 135–145)

## 2023-06-10 LAB — LACTIC ACID, PLASMA
Lactic Acid, Venous: 0.6 mmol/L (ref 0.5–1.9)
Lactic Acid, Venous: 0.6 mmol/L (ref 0.5–1.9)

## 2023-06-10 LAB — RESP PANEL BY RT-PCR (RSV, FLU A&B, COVID)  RVPGX2
Influenza A by PCR: NEGATIVE
Influenza B by PCR: NEGATIVE
Resp Syncytial Virus by PCR: NEGATIVE
SARS Coronavirus 2 by RT PCR: NEGATIVE

## 2023-06-10 LAB — CBG MONITORING, ED: Glucose-Capillary: 116 mg/dL — ABNORMAL HIGH (ref 70–99)

## 2023-06-10 MED ORDER — SODIUM CHLORIDE 0.9 % IV BOLUS
1000.0000 mL | Freq: Once | INTRAVENOUS | Status: AC
  Administered 2023-06-10: 1000 mL via INTRAVENOUS

## 2023-06-10 MED ORDER — SODIUM CHLORIDE 0.9 % IV SOLN
1.0000 g | Freq: Once | INTRAVENOUS | Status: AC
  Administered 2023-06-10: 1 g via INTRAVENOUS
  Filled 2023-06-10: qty 10

## 2023-06-10 MED ORDER — CEFDINIR 300 MG PO CAPS: 300.0000 mg | ORAL_CAPSULE | Freq: Two times a day (BID) | ORAL | 0 refills | Status: DC

## 2023-06-10 NOTE — Discharge Instructions (Addendum)
Please read and follow all provided instructions.  Your diagnoses today include:  1. Complicated UTI (urinary tract infection)    Tests performed today include: Complete blood cell count: Showed elevated white blood cells Basic metabolic panel: Did not show any problems Urinalysis (urine test): Suggested urinary tract infection Lactic acid test: Did not suggest severe sepsis Urine culture: Pending COVID and flu testing: Was negative Chest x-ray: Was clear without signs of pneumonia Vital signs. See below for your results today.   Medications prescribed:  Cefdinir: Antibiotic for UTI  Take any prescribed medications only as directed.  Home care instructions:  Follow any educational materials contained in this packet.  BE VERY CAREFUL not to take multiple medicines containing Tylenol (also called acetaminophen). Doing so can lead to an overdose which can damage your liver and cause liver failure and possibly death.   Follow-up instructions: Please follow-up with your primary care provider in the next 3 days for further evaluation of your symptoms.   Return instructions:  Please return to the Emergency Department if you experience worsening symptoms.  Return if you have persistent fevers, vomiting, uncontrolled pain. Please return if you have any other emergent concerns.  Additional Information:  Your vital signs today were: BP 113/64 (BP Location: Right Arm)   Pulse 71   Temp 98.2 F (36.8 C) (Oral)   Resp 16   SpO2 100%  If your blood pressure (BP) was elevated above 135/85 this visit, please have this repeated by your doctor within one month. --------------

## 2023-06-10 NOTE — ED Triage Notes (Signed)
He tells me that he had an episode of chills last night. He c./o this morning feels "weak and a little dizzy". He is ambulatory and in no distress.

## 2023-06-10 NOTE — ED Provider Notes (Signed)
[place] EMERGENCY DEPARTMENT AT [facility] Provider Note   CSN: [id number] Arrival date & time: 06/10/23  5284     History  Chief Complaint  Patient presents with   Chills   Weakness    Nicholas Felgar. is a 67 y.o. male.  Patient with history of atrial fibrillation on anticoagulation --presents to the emergency department today for evaluation of generalized weakness and chills.  Patient started feeling unwell last evening.  He states that he went to bed early because he felt rundown.  He denies symptoms other than several days of a runny nose.  He had some chills and took Tylenol around 11 PM.  This morning he woke feeling dizzy, unsteady on his feet, generally weak.  He denies ear pain, sore throat.  No cough.  No nausea, vomiting, diarrhea.  No abdominal pain.  No skin rashes.  No urinary symptoms or history of UTI.  Temperature 100.1 F on arrival today.  No known sick contacts.       Home Medications Prior to Admission medications   Medication Sig Start Date End Date Taking? Authorizing Provider  B-D 3CC LUER-LOK SYR 23GX1" 23G X 1" 3 ML MISC SMARTSIG:1 Injection Once a Week 02/06/20   [provider]  B-D HYPODERMIC NEEDLE 18GX1.5" 18G X 1-1/2" MISC USE TO DRAW UP MEDICATION 04/01/20   [provider]  calcium carbonate (OS-CAL - DOSED IN MG OF ELEMENTAL CALCIUM) 1250 MG tablet Take 2 tablets by mouth 2 (two) times daily with a meal.     [provider]  cholecalciferol (VITAMIN D) 1000 UNITS tablet Take 1,000 Units by mouth 2 (two) times daily.    [provider]  COVID-19 mRNA vaccine, Pfizer, (COMIRNATY) syringe Inject into the muscle. 05/01/23   Judyann Munson, MD  diltiazem [facility] CD) 240 MG 24 hr capsule TAKE 1 CAPSULE(240 MG) BY MOUTH DAILY 03/22/23   [place], Peter M, MD  Docusate Sodium (COLACE PO) Take 1 tablet by mouth as directed.    [provider]  ELIQUIS 5 MG TABS tablet TAKE 1 TABLET(5 MG) BY MOUTH  TWICE DAILY 05/04/23   [place], Peter M, MD  metroNIDAZOLE (METROCREAM) 0.75 % cream Apply 1 Application topically 2 (two) times daily. Hasn't started yet. 06/13/22   [provider]  Multiple Vitamin (MULTIVITAMIN) tablet Take 1 tablet by mouth daily.    [provider]  Omega-3 Fatty Acids (OMEGA 3 PO) Take 2 capsules by mouth daily.     [provider]  omeprazole (PRILOSEC) 20 MG capsule Take 1 capsule (20 mg total) by mouth daily. 03/05/12   Collene Gobble, MD  Probiotic Product (PROBIOTIC-10 PO) Take 1 tablet by mouth daily.    [provider]  rosuvastatin (CRESTOR) 10 MG tablet TAKE 1 TABLET(10 MG) BY MOUTH DAILY 10/20/22   [place], Peter M, MD  sildenafil (VIAGRA) 100 MG tablet Take 100 mg by mouth as needed. 08/30/21   [provider]  testosterone cypionate (DEPOTESTOSTERONE CYPIONATE) 200 MG/ML injection Inject into the muscle once a week.     [provider]      Allergies    Doxycycline and Penicillins    Review of Systems   Review of Systems  Physical Exam Updated Vital Signs BP 121/80 (BP Location: Right Arm)   Pulse 68   Temp 100.1 F (37.8 C)   Resp 17   SpO2 100%  Physical Exam Vitals and nursing note reviewed.  Constitutional:  General: He is not in acute distress.    Appearance: He is well-developed.  HENT:     Head: Normocephalic and atraumatic.     Right Ear: External ear normal.     Left Ear: External ear normal.     Nose: Nose normal.     Mouth/Throat:     Mouth: Mucous membranes are moist.     Pharynx: No posterior oropharyngeal erythema.  Eyes:     General:        Right eye: No discharge.        Left eye: No discharge.     Conjunctiva/sclera: Conjunctivae normal.  Cardiovascular:     Rate and Rhythm: Normal rate and regular rhythm.     Heart sounds: Normal heart sounds.  Pulmonary:     Effort: Pulmonary effort is normal.     Breath sounds: Normal breath sounds.  Abdominal:     Palpations:  Abdomen is soft.     Tenderness: There is no abdominal tenderness. There is no guarding or rebound.  Musculoskeletal:     Cervical back: Normal range of motion and neck supple.  Skin:    General: Skin is warm and dry.  Neurological:     Mental Status: He is alert.     ED Results / Procedures / Treatments   Labs (all labs ordered are listed, but only abnormal results are displayed) Labs Reviewed  BASIC METABOLIC PANEL - Abnormal; Notable for the following components:      Result Value   Glucose, Bld 103 (*)    All other components within normal limits  CBC - Abnormal; Notable for the following components:   WBC 14.4 (*)    Platelets 124 (*)    All other components within normal limits  URINALYSIS, ROUTINE W REFLEX MICROSCOPIC - Abnormal; Notable for the following components:   APPearance HAZY (*)    Hgb urine dipstick LARGE (*)    Protein, ur TRACE (*)    Nitrite POSITIVE (*)    Leukocytes,Ua LARGE (*)    Bacteria, UA MANY (*)    All other components within normal limits  CBG MONITORING, ED - Abnormal; Notable for the following components:   Glucose-Capillary 116 (*)    All other components within normal limits  RESP PANEL BY RT-PCR (RSV, FLU A&B, COVID)  RVPGX2  URINE CULTURE  LACTIC ACID, PLASMA  LACTIC ACID, PLASMA  CBG MONITORING, ED    EKG EKG Interpretation Date/Time:  Sunday June 10 2023 09:03:45 EST Ventricular Rate:  66 PR Interval:  170 QRS Duration:  96 QT Interval:  352 QTC Calculation: 369 R Axis:   72  Text Interpretation: Normal sinus rhythm with sinus arrhythmia Normal ECG When compared with ECG of 16-Feb-2021 17:04, PREVIOUS ECG IS PRESENT Confirmed by Rolan Bucco [phone]) on 06/10/2023 9:22:13 AM  Radiology DG Chest 2 View  Result Date: 06/10/2023 CLINICAL DATA:  Weakness. EXAM: CHEST - 2 VIEW COMPARISON:  February 17, 2016. FINDINGS: The heart size and mediastinal contours are within normal limits. Both lungs are clear. The visualized  skeletal structures are unremarkable. IMPRESSION: No active cardiopulmonary disease. Electronically Signed   By: Lupita Raider M.D.   On: 06/10/2023 10:28    Procedures Procedures    Medications Ordered in ED Medications - No data to display  ED Course/ Medical Decision Making/ A&P    Patient seen and examined. History obtained directly from patient.   Labs/EKG: Personally reviewed and interpreted Labs ordered in triage including CBC  with white blood cell count of 14.4 otherwise platelets 124, otherwise unremarkable; BMP with normal electrolytes, normal creatinine.  Awaiting UA.  Added lactate and flu/COVID testing.  Imaging: Personally reviewed and interpreted chest x-ray, agree negative.  Medications/Fluids: None ordered  Most recent vital signs reviewed and are as follows: BP 121/80 (BP Location: Right Arm)   Pulse 68   Temp 100.1 F (37.8 C)   Resp 17   SpO2 100%   Initial impression: Patient with low-grade temperature and elevated white blood cell count, concerning for infection but has paucity of focal symptoms at this time.  Awaiting further evaluation.  Chest x-ray without signs of pneumonia.  12:17 PM Reassessment performed. Patient appears stable.  Source of infection appears to be urine as UA appears grossly infected.  Labs personally reviewed and interpreted including: UA with positive nitrite, greater than 50 white cells, many bacteria, clean-catch.  Ordered urine culture.  Flu and COVID testing negative.  Lactate normal.  Reviewed pertinent lab work and imaging with patient at bedside. Questions answered.   Most current vital signs reviewed and are as follows: BP 113/64 (BP Location: Right Arm)   Pulse 71   Temp 100.1 F (37.8 C)   Resp 16   SpO2 100%   Plan: Fluid bolus, recheck vitals.  IV Rocephin given.  1:27 PM Reassessment performed. Patient appears improved.  He states that he is feeling better.  He is drinking fluids without any problems.  Family  and patient comfortable discharge to home on oral antibiotic.  Recheck temperature 98.2 F.  Reviewed pertinent lab work and imaging with patient at bedside. Questions answered.   Most current vital signs reviewed and are as follows: BP 113/64 (BP Location: Right Arm)   Pulse 71   Temp 98.2 F (36.8 C) (Oral)   Resp 16   SpO2 100%   Plan: Discharge to home.   Prescriptions written for: Cefdinir  Other home care instructions discussed: Rest, hydration  ED return instructions discussed: Return with vomiting, fever, uncontrolled pain, new or worsening symptoms  Follow-up instructions discussed: Patient encouraged to follow-up with their PCP in 3 days.                                    Medical Decision Making Amount and/or Complexity of Data Reviewed Labs: ordered. Radiology: ordered.  Risk Prescription drug management.   67 year old male presenting with generalized weakness and chills.  Elevated white blood cell count noted on CBC.  Infectious workup reveals UTI.  Patient without further fever in the ED.  Normal lactate.  Urine culture pending.  Chest x-ray, flu and COVID testing negative.  Patient responded well to IV fluids, IV antibiotics.  Patient is comfortable with trial of oral antibiotics at home and follow-up with PCP in the coming week.  I feel that this is appropriate given his well appearance.  The patient's vital signs, pertinent lab work and imaging were reviewed and interpreted as discussed in the ED course. Hospitalization was considered for further testing, treatments, or serial exams/observation. However as patient is well-appearing, has a stable exam, and reassuring studies today, I do not feel that they warrant admission at this time. This plan was discussed with the patient who verbalizes agreement and comfort with this plan and seems reliable and able to return to the Emergency Department with worsening or changing symptoms.         Final Clinical  Impression(s) / ED Diagnoses Final diagnoses:  Complicated UTI (urinary tract infection)  Generalized weakness    Rx / DC Orders ED Discharge Orders          Ordered    cefdinir (OMNICEF) 300 MG capsule  2 times daily        06/10/23 1325              Renne Crigler, PA-C 06/10/23 1329    Rolan Bucco, MD 06/10/23 1411

## 2023-06-12 LAB — URINE CULTURE: Culture: >=100000 — AB

## 2023-06-13 ENCOUNTER — Telehealth: Payer: Self-pay | Admitting: Family Medicine

## 2023-06-13 ENCOUNTER — Telehealth (HOSPITAL_BASED_OUTPATIENT_CLINIC_OR_DEPARTMENT_OTHER): Payer: Self-pay

## 2023-06-13 DIAGNOSIS — E349 Endocrine disorder, unspecified: Secondary | ICD-10-CM | POA: Diagnosis not present

## 2023-06-13 DIAGNOSIS — R948 Abnormal results of function studies of other organs and systems: Secondary | ICD-10-CM | POA: Diagnosis not present

## 2023-06-13 NOTE — Progress Notes (Signed)
ED Antimicrobial Stewardship Positive Culture Follow Up   Nicholas West. is an 67 y.o. male who presented to [facility] on 06/10/2023 with a chief complaint of  Chief Complaint  Patient presents with   Chills   Weakness    Recent Results (from the past 720 hour(s))  Resp panel by RT-PCR (RSV, Flu A&B, Covid) Anterior Nasal Swab     Status: None   Collection Time: 06/10/23 11:05 AM   Specimen: Anterior Nasal Swab  Result Value Ref Range Status   SARS Coronavirus 2 by RT PCR NEGATIVE NEGATIVE Final    Comment: (NOTE) SARS-CoV-2 target nucleic acids are NOT DETECTED.  The SARS-CoV-2 RNA is generally detectable in upper respiratory specimens during the acute phase of infection. The lowest concentration of SARS-CoV-2 viral copies this assay can detect is 138 copies/mL. A negative result does not preclude SARS-Cov-2 infection and should not be used as the sole basis for treatment or other patient management decisions. A negative result may occur with  improper specimen collection/handling, submission of specimen other than nasopharyngeal swab, presence of viral mutation(s) within the areas targeted by this assay, and inadequate number of viral copies(<138 copies/mL). A negative result must be combined with clinical observations, patient history, and epidemiological information. The expected result is Negative.  Fact Sheet for Patients:  BloggerCourse.com  Fact Sheet for Healthcare Providers:  SeriousBroker.it  This test is no t yet approved or cleared by the [place] FDA and  has been authorized for detection and/or diagnosis of SARS-CoV-2 by FDA under an Emergency Use Authorization (EUA). This EUA will remain  in effect (meaning this test can be used) for the duration of the COVID-19 declaration under Section 564(b)(1) of the Act, 21 U.S.C.section 360bbb-3(b)(1), unless the authorization is terminated  or revoked sooner.        Influenza A by PCR NEGATIVE NEGATIVE Final   Influenza B by PCR NEGATIVE NEGATIVE Final    Comment: (NOTE) The Xpert Xpress SARS-CoV-2/FLU/RSV plus assay is intended as an aid in the diagnosis of influenza from Nasopharyngeal swab specimens and should not be used as a sole basis for treatment. Nasal washings and aspirates are unacceptable for Xpert Xpress SARS-CoV-2/FLU/RSV testing.  Fact Sheet for Patients: BloggerCourse.com  Fact Sheet for Healthcare Providers: SeriousBroker.it  This test is not yet approved or cleared by the [place] FDA and has been authorized for detection and/or diagnosis of SARS-CoV-2 by FDA under an Emergency Use Authorization (EUA). This EUA will remain in effect (meaning this test can be used) for the duration of the COVID-19 declaration under Section 564(b)(1) of the Act, 21 U.S.C. section 360bbb-3(b)(1), unless the authorization is terminated or revoked.     Resp Syncytial Virus by PCR NEGATIVE NEGATIVE Final    Comment: (NOTE) Fact Sheet for Patients: BloggerCourse.com  Fact Sheet for Healthcare Providers: SeriousBroker.it  This test is not yet approved or cleared by the [place] FDA and has been authorized for detection and/or diagnosis of SARS-CoV-2 by FDA under an Emergency Use Authorization (EUA). This EUA will remain in effect (meaning this test can be used) for the duration of the COVID-19 declaration under Section 564(b)(1) of the Act, 21 U.S.C. section 360bbb-3(b)(1), unless the authorization is terminated or revoked.  Performed at [facility], [address] [place], [place] [address]   Urine Culture     Status: Abnormal   Collection Time: 06/10/23 11:49 AM   Specimen: Urine, Clean Catch  Result Value Ref Range Status  Specimen Description   Final    URINE, CLEAN CATCH Performed at [facility], [address] [place], [place] [address]    Special Requests   Final    NONE Performed at [facility], [address] [place], [place] [address]    Culture >=100,000 COLONIES/mL ESCHERICHIA COLI (A)  Final   Report Status 06/12/2023 FINAL  Final   Organism ID, Bacteria ESCHERICHIA COLI (A)  Final      Susceptibility   Escherichia coli - MIC*    AMPICILLIN >=32 RESISTANT Resistant     CEFAZOLIN >=64 RESISTANT Resistant     CEFEPIME <=0.12 SENSITIVE Sensitive     CEFTRIAXONE 1 SENSITIVE Sensitive     CIPROFLOXACIN <=0.25 SENSITIVE Sensitive     GENTAMICIN <=1 SENSITIVE Sensitive     IMIPENEM <=0.25 SENSITIVE Sensitive     NITROFURANTOIN <=16 SENSITIVE Sensitive     TRIMETH/SULFA <=20 SENSITIVE Sensitive     AMPICILLIN/SULBACTAM >=32 RESISTANT Resistant     PIP/TAZO 16 SENSITIVE Sensitive ug/mL    * >=100,000 COLONIES/mL ESCHERICHIA COLI   [x]  Treated with Cefdinir, organism resistant to prescribed antimicrobial  New antibiotic prescription: Stop Cefdinir. Start Bactrim 1 DS tablet PO BID for 5 days  ED Provider: Jannifer Hick, PA-C  Enos Fling, PharmD PGY-1 Acute Care Pharmacy Resident 06/13/2023 9:09 AM Monday - Friday phone -  [phone] Saturday - Sunday phone - [phone]

## 2023-06-13 NOTE — Telephone Encounter (Signed)
Appreciate the notification, I did receive notes from ED.  Thanks

## 2023-06-13 NOTE — Telephone Encounter (Signed)
Post ED Visit - Positive Culture Follow-up: Successful Patient Follow-Up  Culture assessed and recommendations reviewed by:  [x]  Enos Fling, Pharm.D. []  Celedonio Miyamoto, Pharm.D., BCPS AQ-ID []  Garvin Fila, Pharm.D., BCPS []  Georgina Pillion, Pharm.D., BCPS []  [place], [address].D., BCPS, AAHIVP []  Estella Husk, Pharm.D., BCPS, AAHIVP []  Lysle Pearl, PharmD, BCPS []  Phillips Climes, PharmD, BCPS []  Agapito Games, PharmD, BCPS []  Verlan Friends, PharmD  Positive urine culture  []  Patient discharged without antimicrobial prescription and treatment is now indicated [x]  Organism is resistant to prescribed ED discharge antimicrobial []  Patient with positive blood cultures  Changes discussed with ED provider: Jannifer Hick, PA-C New antibiotic prescription Bactrim DS 1 tablet po BID x 5 days  Called to [facility] on [address]   Contacted patient, date 06/13/2023, time 10:20   Sandria Senter 06/13/2023, 10:21 AM

## 2023-06-13 NOTE — Telephone Encounter (Signed)
Client [facility] [place] Night - C Client Site [facility] - Night Provider Meredith Staggers- MD Contact Type Call Who Is Calling Patient / Member / Family / Caregiver Caller Name [name] Caller Phone Number [phone] Patient Name Nicholas West Patient DOB 1955-10-04 Call Type Message Only Information Provided Reason for Call Request for General Office Information Initial Comment Caller says he went to Urgent Care Sunday he was told he has a UTI and given a Rx. Caller wants to know this. Disp. Time Disposition Final User 06/12/2023 1:46:13 PM General Information Provided Yes Imagene Riches Call Closed By: Imagene Riches Transaction Date/Time: 06/12/2023 1:43:44 PM (ET)  Pt was called left message

## 2023-06-14 DIAGNOSIS — H5213 Myopia, bilateral: Secondary | ICD-10-CM | POA: Diagnosis not present

## 2023-06-15 ENCOUNTER — Ambulatory Visit: Payer: Medicare Other | Admitting: Physician Assistant

## 2023-06-15 ENCOUNTER — Encounter: Payer: Self-pay | Admitting: Physician Assistant

## 2023-06-15 VITALS — BP 120/80 | HR 68 | Resp 18 | Ht 73.0 in

## 2023-06-15 DIAGNOSIS — G3184 Mild cognitive impairment, so stated: Secondary | ICD-10-CM

## 2023-06-15 DIAGNOSIS — R413 Other amnesia: Secondary | ICD-10-CM

## 2023-06-15 MED ORDER — DONEPEZIL HCL 5 MG PO TABS: 5.0000 mg | ORAL_TABLET | Freq: Every day | ORAL | 11 refills | Status: DC

## 2023-06-15 NOTE — Patient Instructions (Addendum)
It was a pleasure to see you today at our office.   Recommendations:  Continue Eliquis daily Start Donepezil 5mg  daily. Side effects discussed   Recommend good control of cardiovascular risk factors.    Follow up in  Jun 13 2024 at 11:30     RECOMMENDATIONS FOR ALL PATIENTS WITH MEMORY PROBLEMS: 1. Continue to exercise (Recommend 30 minutes of walking everyday, or 3 hours every week) 2. Increase social interactions - continue going to [place] and enjoy social gatherings with friends and family 3. Eat healthy, avoid fried foods and eat more fruits and vegetables 4. Maintain adequate blood pressure, blood sugar, and blood cholesterol level. Reducing the risk of stroke and cardiovascular disease also helps promoting better memory. 5. Avoid stressful situations. Live a simple life and avoid aggravations. Organize your time and prepare for the next day in anticipation. 6. Sleep well, avoid any interruptions of sleep and avoid any distractions in the bedroom that may interfere with adequate sleep quality 7. Avoid sugar, avoid sweets as there is a strong link between excessive sugar intake, diabetes, and cognitive impairment We discussed the Mediterranean diet, which has been shown to help patients reduce the risk of progressive memory disorders and reduces cardiovascular risk. This includes eating fish, eat fruits and green leafy vegetables, nuts like almonds and hazelnuts, walnuts, and also use olive oil. Avoid fast foods and fried foods as much as possible. Avoid sweets and sugar as sugar use has been linked to worsening of memory function.  There is always a concern of gradual progression of memory problems. If this is the case, then we may need to adjust level of care according to patient needs. Support, both to the patient and caregiver, should then be put into place.    FALL PRECAUTIONS: Be cautious when walking. Scan the area for obstacles that may increase the risk of trips and falls. When  getting up in the mornings, sit up at the edge of the bed for a few minutes before getting out of bed. Consider elevating the bed at the head end to avoid drop of blood pressure when getting up. Walk always in a well-lit room (use night lights in the walls). Avoid area rugs or power cords from appliances in the middle of the walkways. Use a walker or a cane if necessary and consider physical therapy for balance exercise. Get your eyesight checked regularly.  FINANCIAL OVERSIGHT: Supervision, especially oversight when making financial decisions or transactions is also recommended.  HOME SAFETY: Consider the safety of the kitchen when operating appliances like stoves, microwave oven, and blender. Consider having supervision and share cooking responsibilities until no longer able to participate in those. Accidents with firearms and other hazards in the house should be identified and addressed as well.   ABILITY TO BE LEFT ALONE: If patient is unable to contact 911 operator, consider using LifeLine, or when the need is there, arrange for someone to stay with patients. Smoking is a fire hazard, consider supervision or cessation. Risk of wandering should be assessed by caregiver and if detected at any point, supervision and safe proof recommendations should be instituted.  MEDICATION SUPERVISION: Inability to self-administer medication needs to be constantly addressed. Implement a mechanism to ensure safe administration of the medications.   DRIVING: Regarding driving, in patients with progressive memory problems, driving will be impaired. We advise to have someone else do the driving if trouble finding directions or if minor accidents are reported. Independent driving assessment is available to determine  safety of driving.   If you are interested in the driving assessment, you can contact the following:  [facility] in [place] [phone]  [facility]  [phone]  [facility] [place] [phone] or [phone]    Mediterranean Diet A Mediterranean diet refers to food and lifestyle choices that are based on the traditions of countries located on the [employer]. This way of eating has been shown to help prevent certain conditions and improve outcomes for people who have chronic diseases, like kidney disease and heart disease. What are tips for following this plan? Lifestyle  Cook and eat meals together with your family, when possible. Drink enough fluid to keep your urine clear or pale yellow. Be physically active every day. This includes: Aerobic exercise like running or swimming. Leisure activities like gardening, walking, or housework. Get 7-8 hours of sleep each night. If recommended by your health care provider, drink red wine in moderation. This means 1 glass a day for nonpregnant women and 2 glasses a day for men. A glass of wine equals 5 oz (150 mL). Reading food labels  Check the serving size of packaged foods. For foods such as rice and pasta, the serving size refers to the amount of cooked product, not dry. Check the total fat in packaged foods. Avoid foods that have saturated fat or trans fats. Check the ingredients list for added sugars, such as corn syrup. Shopping  At the grocery store, buy most of your food from the areas near the walls of the store. This includes: Fresh fruits and vegetables (produce). Grains, beans, nuts, and seeds. Some of these may be available in unpackaged forms or large amounts (in bulk). Fresh seafood. Poultry and eggs. Low-fat dairy products. Buy whole ingredients instead of prepackaged foods. Buy fresh fruits and vegetables in-season from local farmers markets. Buy frozen fruits and vegetables in resealable bags. If you do not have access to quality fresh seafood, buy precooked frozen shrimp or canned fish, such as tuna, salmon, or sardines. Buy  small amounts of raw or cooked vegetables, salads, or olives from the deli or salad bar at your store. Stock your pantry so you always have certain foods on hand, such as olive oil, canned tuna, canned tomatoes, rice, pasta, and beans. Cooking  Cook foods with extra-virgin olive oil instead of using butter or other vegetable oils. Have meat as a side dish, and have vegetables or grains as your main dish. This means having meat in small portions or adding small amounts of meat to foods like pasta or stew. Use beans or vegetables instead of meat in common dishes like chili or lasagna. Experiment with different cooking methods. Try roasting or broiling vegetables instead of steaming or sauteing them. Add frozen vegetables to soups, stews, pasta, or rice. Add nuts or seeds for added healthy fat at each meal. You can add these to yogurt, salads, or vegetable dishes. Marinate fish or vegetables using olive oil, lemon juice, garlic, and fresh herbs. Meal planning  Plan to eat 1 vegetarian meal one day each week. Try to work up to 2 vegetarian meals, if possible. Eat seafood 2 or more times a week. Have healthy snacks readily available, such as: Vegetable sticks with hummus. Greek yogurt. Fruit and nut trail mix. Eat balanced meals throughout the week. This includes: Fruit: 2-3 servings a day Vegetables: 4-5 servings a day Low-fat dairy: 2 servings a day Fish, poultry, or lean meat: 1 serving a day Beans  and legumes: 2 or more servings a week Nuts and seeds: 1-2 servings a day Whole grains: 6-8 servings a day Extra-virgin olive oil: 3-4 servings a day Limit red meat and sweets to only a few servings a month What are my food choices? Mediterranean diet Recommended Grains: Whole-grain pasta. Brown rice. Bulgar wheat. Polenta. Couscous. Whole-wheat bread. Orpah Cobb. Vegetables: Artichokes. Beets. Broccoli. Cabbage. Carrots. Eggplant. Green beans. Chard. Kale. Spinach. Onions. Leeks. Peas.  Squash. Tomatoes. Peppers. Radishes. Fruits: Apples. Apricots. Avocado. Berries. Bananas. Cherries. Dates. Figs. Grapes. Lemons. Melon. Oranges. Peaches. Plums. Pomegranate. Meats and other protein foods: Beans. Almonds. Sunflower seeds. Pine nuts. Peanuts. Cod. Salmon. Scallops. Shrimp. Tuna. Tilapia. Clams. Oysters. Eggs. Dairy: Low-fat milk. Cheese. Greek yogurt. Beverages: Water. Red wine. Herbal tea. Fats and oils: Extra virgin olive oil. Avocado oil. Grape seed oil. Sweets and desserts: [place] yogurt with honey. Baked apples. Poached pears. Trail mix. Seasoning and other foods: Basil. Cilantro. Coriander. Cumin. Mint. Parsley. Sage. Rosemary. Tarragon. Garlic. Oregano. Thyme. Pepper. Balsalmic vinegar. Tahini. Hummus. Tomato sauce. Olives. Mushrooms. Limit these Grains: Prepackaged pasta or rice dishes. Prepackaged cereal with added sugar. Vegetables: Deep fried potatoes (french fries). Fruits: Fruit canned in syrup. Meats and other protein foods: Beef. Pork. Lamb. Poultry with skin. Hot dogs. Tomasa Blase. Dairy: Ice cream. Sour cream. Whole milk. Beverages: Juice. Sugar-sweetened soft drinks. Beer. Liquor and spirits. Fats and oils: Butter. Canola oil. Vegetable oil. Beef fat (tallow). Lard. Sweets and desserts: Cookies. Cakes. Pies. Candy. Seasoning and other foods: Mayonnaise. Premade sauces and marinades. The items listed may not be a complete list. Talk with your dietitian about what dietary choices are right for you. Summary The Mediterranean diet includes both food and lifestyle choices. Eat a variety of fresh fruits and vegetables, beans, nuts, seeds, and whole grains. Limit the amount of red meat and sweets that you eat. Talk with your health care provider about whether it is safe for you to drink red wine in moderation. This means 1 glass a day for nonpregnant women and 2 glasses a day for men. A glass of wine equals 5 oz (150 mL). This information is not intended to replace advice  given to you by your health care provider. Make sure you discuss any questions you have with your health care provider. Document Released: 03/09/2016 Document Revised: 04/11/2016 Document Reviewed: 03/09/2016 Elsevier Interactive Patient Education  2017 [employer].  We have sent a referral to [facility] for your MRI and they will call you directly to schedule your appointment. They are located at [address] [facility]. If you need to contact them directly please call [phone].  Your provider has requested that you have labwork completed today. Please go to [facility] ([id number]) on the second floor of this building before leaving the office today. You do not need to check in. If you are not called within 15 minutes please check with the front desk.

## 2023-06-15 NOTE — Progress Notes (Incomplete)
Assessment/Plan:   Mild cognitive impairment, concern for vascular etiology  Nicholas Parro. is a very pleasant 67 y.o. RH male with a history of hyperlipidemia, Afib, and a diagnosis of MCI of unclear etiology with concerns for vascular disease per neuropsych evaluation 05/2022 presenting today in follow-up for evaluation of memory loss. Patient is not on antidementia meds at this time. Memory is stable. MMSE is 29/30. Patient is able to participate on his ADLs and to drive without difficulties.He reports new onset of minimal intention tremors and mild head bobbing. It does not interfere with his ADLs. Finding is concerning for ET. Will need monitoring. He has been instructed to return to the office sooner if symptoms worsen.      Recommendations:   Follow up in  6 months. Repeat neuropsych evaluation for diagnostic clarity and disease trajectory Start donepezil 5 mg daily, side effects discussed  Recommend good control of cardiovascular risk factors Continue to control mood as per PCP Monitor tremors     Subjective:   This patient is accompanied in the office by his wife  who supplements the history. Previous records as well as any outside records available were reviewed prior to todays visit.   Patient was last seen on 06/14/22    Any changes in memory since last visit? "Worse than before". LTM  is good. He continues to have difficulties with multitasking. If interrupted, he may have trouble resuming the task. Uses a list, to do notes and a calendar.  "Does not really do brain games". Still working part time. repeats oneself?  Endorsed but not often. Disoriented when walking into a room?  Patient denies.   Misplacing objects?  Patient denies. If I ask him to go to the pantry he goes to the laundry room, it happened a couple of times  Wandering behavior?   denies   Any personality changes since last visit? Denies.   Any worsening depression?: denies.   Hallucinations or  paranoia?  Denies.   Seizures?   Denies.    Any sleep changes? Sleeps well . Denies vivid dreams, REM behavior or sleepwalking   Sleep apnea?  denies   Any hygiene concerns?   Denies.   Independent of bathing and dressing?  Endorsed.  Does the patient needs help with medications? Patient is in charge, wife places meds inside pillbox  Who is in charge of the finances?  Patient and wife are in charge .    Any changes in appetite?  Denies. He adheres to heart healthy diet. Not drinking enough water.    Patient have trouble swallowing?  Denies.   Does the patient cook? No   Any headaches?    Denies.   Vision changes? Denies. Chronic pain?  Denies. Ambulates with difficulty? Denies.   Recent falls or head injuries?  Denies.      Unilateral weakness, numbness or tingling? Denies.   Any tremors?  Endorsed, on intention, BUE, he also reports mild head bobbing. Denies voice changes or trouble walking.  Any anosmia?    Denies.   Any incontinence of urine? He had a recent presentation to ED for complicated UTI with chills and weakness. Any bowel dysfunction?  Denies.      Patient lives with his wife.  Does the patient drive? Yes, uses GPS.    Neuropsych evaluation 05/31/22 Briefly, results suggested an isolated impairment surrounding verbal fluency (phonemic worse than semantic). Relative weaknesses were also exhibited across processing speed and, to a lesser extent, visuospatial  abilities. Performances across other assessed cognitive domains were adequate. The etiology for ongoing dysfunction is unclear. There remains the possibility of a primary vascular etiology given his medical history (i.e., atrial fibrillation, hypercholesterolemia) and recent neuroimaging suggesting moderate microvascular ischemic disease and a prior lacunar infarct. Deficits in processing speed and verbal fluency are very common in primary vascular etiologies and would align reasonably well with this as the underlying cause.  While visuospatial dysfunction is most commonly seen with illness or damage involving the parietal lobe, the location of his stroke in the occipital lobe may also be impacting these performances if there has been any disruption with the front end of the visual processing cognitive network. Admittedly, the extent of phonemic fluency impairment is surprising even when considering his cerebrovascular health. Despite this, I do not have compelling evidence to raise suspicion surrounding a non-fluent primary progressive aphasia presentation. Memory performances do not suggest concerns for Alzheimer's disease at the present time.       Initial Visit 09/16/21 The patient is seen in neurologic consultation at the request of Shade Flood, MD for the evaluation of memory.  The patient is accompanied by his wife  who supplements the history. 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  She states that he cannot remember recent conversations, and within 15 minutes he will ask again.  His short-term memory is worse than the long-term memory.  He denies anyone noticing at work that he has any cognitive difficulties.  She states that he used to be able to drive without difficulties, and now, while driving, he is distracted, and at times will pass the turn.  He cannot multitask with directions.  Despite having a calendar, first in the morning his wife has to put a note in the mirror in the bathroom otherwise he will forget.  She states that he repeats himself more frequently. He denies being disoriented when walking into a room.  He denies leaving objects in unusual places.  He ambulates without difficulty,  He has some unsteadiness on his feet, especially when taking steps up to the kitchen from the garage.  He has to hold the railing otherwise he would fall.  This has been present for  several months, during the falls, he denies any loss of consciousness, "I only hurt to my pride " he says.  In July, during a family vacation, he states that his feet went unsteady, but then again "the sand was too soft ".  He does report for many years leaning to the left.  He denies any prior history of TIA or stroke.  He is unsure if the blood pressure medications may be contributing to the sense dizziness.  He denies any lack of sensations on his legs or arms.  He also has noticed that for several years he had some right greater than left head bobbing, and his wife reports that sometimes he has some left jerky movements of his arm.  He denies any history of seizures or Parkinsonism.  His mood is good, denies depression or irritability.  His wife states that he has significant REM behavior at night, moving and tossing, possibly acting out.  He does not know if he sleeps well.  He denies remembering any vivid dreams or sleepwalking.  He denies hallucinations or paranoia. History of 2 falls previously, no recent falls.  Reports sensation of his legs not cooperating and losing balance. Had to catch self once since last visit. No pain, just unsteady.  His appetite is good, he eats 3 meals a day, drinking plenty fluids during the day .  He is active, he does occasionally fast to walk during the week.  Denies any hygiene concerns, he is independent of bathing and dressing.  His wife puts the medications out in a pillbox for him while he is in the shower.  They do the finances together, he reports no issues.  He was concerned recently, because at work, he could not remember the password in his computer, needing to get it out of his own personal computer.  He denies any headaches, he had some episodes of double vision, last one recently went back in to go to the mountains, when he "got hot and tired ".  He denies any blurred vision.  He denies any dizziness, focal numbness or tingling, or anosmia.  No history of  seizures.  He denies any tremors.  He denies urine incontinence, retention, constipation or diarrhea.  He denies a history of sleep apnea, alcohol, tobacco. He reports a history of melanocytic nevus s.p large excision of the scal on the left. He also had noticed a recent rash on the forehead. He has an appt with the dermatologist soon. His sister died with the advanced dementia at 2, father with Alzheimer's disease at 68.    MRI brain without contrast 10/10/21 Moderate for age cerebral white matter signal changes are nonspecific but most commonly due to chronic small vessel disease. And a subtle area of gyriform enhancement in the left occipital pole is most suggestive of a subacute cortical infarct. No associated edema or mass effect. No other acute intracranial abnormality.   MRA 10/26/20 No intracranial large vessel occlusion.Apparent moderate stenosis within the A2 segment of the right anterior cerebral artery.   Labs 09/16/2021 with normal CMP, CBC, sed rate, B12, TSH, ammonia, RPR, HIV, ANA.  Past Medical History:  Diagnosis Date   Abdominal pain    BCC (basal cell carcinoma) 02/05/2013   Recurrent, top right scalp, MOHs   Cerebrovascular disease    Chest pain    negative stress echo in September 2013   Contusion of right elbow 10/11/2016   Diverticulitis    Dysplastic nevus    moderate, Left back no treatment   GERD (gastroesophageal reflux disease)    Hiatal hernia 08/02/2020   Hypercholesterolemia 04/03/2012   Laceration of hand without foreign body 10/11/2016   Lacunar infarction 10/10/2021   MRI - subtle area of subacute gyriform enhancement in the left occipital pole   Male hypogonadism 03/05/2012   Mild cognitive impairment of uncertain or unknown etiology 05/24/2022   Paroxysmal atrial fibrillation 10/24/2012     Past Surgical History:  Procedure Laterality Date   COLON SURGERY     PARTIAL COLECTOMY  11/11     PREVIOUS MEDICATIONS:   CURRENT MEDICATIONS:   Outpatient Encounter Medications as of 06/15/2023  Medication Sig   B-D 3CC LUER-LOK SYR 23GX1" 23G X 1" 3 ML MISC SMARTSIG:1 Injection Once a Week   B-D HYPODERMIC NEEDLE 18GX1.5" 18G X 1-1/2" MISC USE TO DRAW UP MEDICATION   calcium carbonate (OS-CAL - DOSED IN MG OF ELEMENTAL CALCIUM) 1250 MG tablet Take 2 tablets by mouth 2 (two) times daily with a meal.    cholecalciferol (VITAMIN D) 1000 UNITS tablet Take 1,000 Units by mouth 2 (two) times daily.   COVID-19  mRNA vaccine, Pfizer, (COMIRNATY) syringe Inject into the muscle.   diltiazem (CARDIZEM CD) 240 MG 24 hr capsule TAKE 1 CAPSULE(240 MG) BY MOUTH DAILY   Docusate Sodium (COLACE PO) Take 1 tablet by mouth as directed.   donepezil (ARICEPT) 5 MG tablet Take 1 tablet (5 mg total) by mouth daily.   ELIQUIS 5 MG TABS tablet TAKE 1 TABLET(5 MG) BY MOUTH TWICE DAILY   metroNIDAZOLE (METROCREAM) 0.75 % cream Apply 1 Application topically 2 (two) times daily. Hasn't started yet.   Multiple Vitamin (MULTIVITAMIN) tablet Take 1 tablet by mouth daily.   Omega-3 Fatty Acids (OMEGA 3 PO) Take 2 capsules by mouth daily.    omeprazole (PRILOSEC) 20 MG capsule Take 1 capsule (20 mg total) by mouth daily.   Probiotic Product (PROBIOTIC-10 PO) Take 1 tablet by mouth daily.   rosuvastatin (CRESTOR) 10 MG tablet TAKE 1 TABLET(10 MG) BY MOUTH DAILY   sildenafil (VIAGRA) 100 MG tablet Take 100 mg by mouth as needed.   testosterone cypionate (DEPOTESTOSTERONE CYPIONATE) 200 MG/ML injection Inject into the muscle once a week.    cefdinir (OMNICEF) 300 MG capsule Take 1 capsule (300 mg total) by mouth 2 (two) times daily.   No facility-administered encounter medications on file as of 06/15/2023.     Objective:     PHYSICAL EXAMINATION:    VITALS:   Vitals:   06/15/23 1128  BP: 120/80  Pulse: 68  Resp: 18  SpO2: 98%  Height: 6\' 1"  (1.854 m)    GEN:  The patient appears stated age and is in NAD. HEENT:  Normocephalic, atraumatic.    Neurological examination:  General: NAD, well-groomed, appears stated age. Orientation: The patient is alert. Oriented to person, place and date Cranial nerves: There is good facial symmetry.The speech is fluent and clear. No aphasia or dysarthria. Fund of knowledge is appropriate. Recent memory impaired and remote memory is normal.  Attention and concentration are normal.  Able to name objects and repeat phrases.  Hearing is intact to conversational tone .     Sensation: Sensation is intact to light touch throughout Motor: Strength is at least antigravity x4. DTR's 2/4 in UE/LE      09/18/2021    8:00 PM  Montreal Cognitive Assessment   Visuospatial/ Executive (0/5) 4  Naming (0/3) 3  Attention: Read list of digits (0/2) 2  Attention: Read list of letters (0/1) 1  Attention: Serial 7 subtraction starting at 100 (0/3) 3  Language: Repeat phrase (0/2) 2  Language : Fluency (0/1) 1  Abstraction (0/2) 2  Delayed Recall (0/5) 5  Orientation (0/6) 6  Total 29  Adjusted Score (based on education) 29       06/16/2023    4:00 PM  MMSE - Mini Mental State Exam  Orientation to time 4  Orientation to Place 5  Registration 3  Attention/ Calculation 5  Recall 3  Language- name 2 objects 2  Language- repeat 1  Language- follow 3 step command 3  Language- read & follow direction 1  Write a sentence 1  Copy design 1  Total score 29       Movement examination: Tone: There is normal tone in the UE/LE. No cogwheeling Abnormal movements:  mild intention tremor B. Mild head tremor.   No myoclonus.  No asterixis.   Coordination:  There is no decremation with RAM's. Normal finger to nose  Gait and Station: The patient has no difficulty arising out of a deep-seated chair without  the use of the hands. The patient's stride length is good.  Good arm swing. Gait is cautious and narrow.   Thank you for allowing [place] the opportunity to participate in the care of this nice patient. Please do not  hesitate to contact us for any questions or concerns.   Total time spent on today's visit was 35 minutes dedicated to this patient today, preparing to see patient, examining the patient, ordering tests and/or medications and counseling the patient, documenting clinical information in the EHR or other health record, independently interpreting results and communicating results to the patient/family, discussing treatment and goals, answering patient's questions and coordinating care.  Cc:  Shade Flood, MD  Marlowe Kays 06/16/2023 4:05 PM

## 2023-06-19 NOTE — Progress Notes (Signed)
Nicholas West. Date of Birth: 12-03-1955 Medical Record #[id number]  History of Present Illness: Nicholas West is seen for follow up of atrial fibrillation.   He has a history of paroxysmal atrial fibrillation. He has been managed with rate control with diltiazem. He previously had a [place] vascular score of 0 and was  taking a baby aspirin daily.   In 2019 he complained of nausea and tingling in his arms with exercise. We arranged a POET. On arrival he was in AFib with rate 98. He exercised for 9 minutes on the Bruce protocol without symptoms or ST changes. He converted to NSR then went back into atrial flutter with RVR. His Cardizem was increased to 240 mg daily.    Recently he was experiencing some issues with his balance and memory loss. He had a couple of falls. Was evaluated by Dr Nicholas West. EEG and extensive lab work OK. Cranial MRI showed evidence of subacute infarct in the  left occipital pole. MRA showed modest stenosis in the A2 segment of the right anterior cerebral artery.   On follow up today he is doing very well. No chest pain or SOB. No palpitations.  Walks on treadmill 6 days a week for an hour. Did have UTI recently. Planning to start Aricept for memory per Neuro   Current Outpatient Medications on File Prior to Visit  Medication Sig Dispense Refill   B-D 3CC LUER-LOK SYR 23GX1" 23G X 1" 3 ML MISC SMARTSIG:1 Injection Once a Week     B-D HYPODERMIC NEEDLE 18GX1.5" 18G X 1-1/2" MISC USE TO DRAW UP MEDICATION     calcium carbonate (OS-CAL - DOSED IN MG OF ELEMENTAL CALCIUM) 1250 MG tablet Take 2 tablets by mouth 2 (two) times daily with a meal.      cholecalciferol (VITAMIN D) 1000 UNITS tablet Take 1,000 Units by mouth 2 (two) times daily.     cyanocobalamin (VITAMIN B12) 500 MCG tablet Take 500 mcg by mouth daily.     diltiazem (CARDIZEM CD) 240 MG 24 hr capsule TAKE 1 CAPSULE(240 MG) BY MOUTH DAILY 90 capsule 3   Docusate Sodium (COLACE PO) Take 1 tablet by mouth daily as needed  (constipation).     ELIQUIS 5 MG TABS tablet TAKE 1 TABLET(5 MG) BY MOUTH TWICE DAILY 180 tablet 1   metroNIDAZOLE (METROCREAM) 0.75 % cream Apply 1 Application topically 2 (two) times daily.     Multiple Vitamin (MULTIVITAMIN) tablet Take 1 tablet by mouth daily.     Omega-3 Fatty Acids (OMEGA 3 PO) Take 2 capsules by mouth daily.      omeprazole (PRILOSEC) 20 MG capsule Take 1 capsule (20 mg total) by mouth daily. 30 capsule 11   Probiotic Product (PROBIOTIC-10 PO) Take 1 tablet by mouth daily.     rosuvastatin (CRESTOR) 10 MG tablet TAKE 1 TABLET(10 MG) BY MOUTH DAILY 90 tablet 3   sildenafil (VIAGRA) 100 MG tablet Take 100 mg by mouth as needed.     testosterone cypionate (DEPOTESTOSTERONE CYPIONATE) 200 MG/ML injection Inject into the muscle once a week.      No current facility-administered medications on file prior to visit.    Allergies  Allergen Reactions   Doxycycline Rash   Penicillins     rash    Past Medical History:  Diagnosis Date   Abdominal pain    BCC (basal cell carcinoma) 02/05/2013   Recurrent, top right scalp, MOHs   Cerebrovascular disease    Chest pain  negative stress echo in September 2013   Contusion of right elbow 10/11/2016   Diverticulitis    Dysplastic nevus    moderate, Left back no treatment   GERD (gastroesophageal reflux disease)    Hiatal hernia 08/02/2020   Hypercholesterolemia 04/03/2012   Laceration of hand without foreign body 10/11/2016   Lacunar infarction 10/10/2021   MRI - subtle area of subacute gyriform enhancement in the left occipital pole   Male hypogonadism 03/05/2012   Mild cognitive impairment of uncertain or unknown etiology 05/24/2022   Paroxysmal atrial fibrillation 10/24/2012    Past Surgical History:  Procedure Laterality Date   COLON SURGERY     PARTIAL COLECTOMY  11/11    Social History   Tobacco Use  Smoking Status Never  Smokeless Tobacco Never    Social History   Substance and Sexual Activity   Alcohol Use No    Family History  Problem Relation Age of Onset   Cancer Mother    Alzheimer's disease Father        onset in mid-60s   Colon polyps Father    GER disease Father    Dementia Sister        unspecified type; rapidly progressing   Heart disease Paternal Grandmother    Cancer Paternal Grandfather     Review of Systems: The review of systems is per the HPI.  All other systems were reviewed and are negative.  Physical Exam: BP 112/72 (BP Location: Right Arm, Patient Position: Sitting, Cuff Size: Normal)   Pulse (!) 56   Ht 6' (1.829 m)   Wt 159 lb 3.2 oz (72.2 kg)   SpO2 97%   BMI 21.59 kg/m  GENERAL:  Well appearing WM in NAD HEENT:  PERRL, EOMI, sclera are clear. Oropharynx is clear. NECK:  No jugular venous distention, carotid upstroke brisk and symmetric, no bruits, no thyromegaly or adenopathy LUNGS:  Clear to auscultation bilaterally CHEST:  Unremarkable HEART:  RRR,  PMI not displaced or sustained,S1 and S2 within normal limits, no S3, no S4: no clicks, no rubs, no murmurs ABD:  Soft, nontender. BS +, no masses or bruits. No hepatomegaly, no splenomegaly EXT:  2 + pulses throughout, no edema, no cyanosis no clubbing SKIN:  Warm and dry.  No rashes NEURO:  Alert and oriented x 3. Cranial nerves II through XII intact. PSYCH:  Cognitively intact  LABORATORY DATA:   Lab Results  Component Value Date   WBC 14.4 (H) 06/10/2023   HGB 16.0 06/10/2023   HCT 45.5 06/10/2023   PLT 124 (L) 06/10/2023   GLUCOSE 103 (H) 06/10/2023   CHOL 112 08/07/2022   TRIG 61.0 08/07/2022   HDL 48.20 08/07/2022   LDLCALC 52 08/07/2022   ALT 37 01/01/2023   AST 27 01/01/2023   NA 138 06/10/2023   K 3.6 06/10/2023   CL 103 06/10/2023   CREATININE 0.92 06/10/2023   BUN 12 06/10/2023   CO2 29 06/10/2023   TSH 2.05 09/16/2021   PSA 1.38 10/29/2015   INR 1.10 06/04/2013   HGBA1C 5.4 08/04/2021     ETT 08/31/17: Study Highlights   Blood pressure demonstrated a  blunted response to exercise. Patient started the test in atrial fibrillation and converted to sinus rhythm and subsequently atrial flutter with rapid ventricular response with exercise. There was no ST segment deviation noted during stress. Negative, adequate stress test.   Echo 10/25/21: IMPRESSIONS     1. Left ventricular ejection fraction, by estimation, is 60%. Left  ventricular ejection fraction by 3D volume is 58 %. The left ventricle has  normal function. The left ventricle has no regional wall motion  abnormalities. Left ventricular diastolic  parameters are consistent with Grade I diastolic dysfunction (impaired  relaxation). The average left ventricular global longitudinal strain is  -20.0 %. The global longitudinal strain is normal.   2. Right ventricular systolic function is normal. The right ventricular  size is normal. Tricuspid regurgitation signal is inadequate for assessing  PA pressure.   3. The mitral valve is normal in structure. Trivial mitral valve  regurgitation. No evidence of mitral stenosis.   4. The aortic valve is tricuspid. Aortic valve regurgitation is not  visualized. No aortic stenosis is present.   5. The inferior vena cava is normal in size with greater than 50%  respiratory variability, suggesting right atrial pressure of 3 mmHg.   Comparison(s): No prior Echocardiogram from records available.   Conclusion(s)/Recommendation(s): No intracardiac source of embolism  detected on this transthoracic study. Consider a transesophageal  echocardiogram to exclude cardiac source of embolism if clinically  indicated.   Assessment / Plan: 1. Paroxysmal atrial fibrillation and flutter. Patient is minimally symptomatic. Now with evidence of CVA on MRI his  [place] vascular score is 2. He is anticoagulated with Eliquis. Continue  diltiazem daily. Avoid NSAIDs. 2. Prior CVA on MRI 3. HLD. Excellent response with Crestor. LDL 52.  Follow up in 6 months.

## 2023-06-20 DIAGNOSIS — E291 Testicular hypofunction: Secondary | ICD-10-CM | POA: Diagnosis not present

## 2023-06-20 DIAGNOSIS — N400 Enlarged prostate without lower urinary tract symptoms: Secondary | ICD-10-CM | POA: Diagnosis not present

## 2023-06-20 DIAGNOSIS — R972 Elevated prostate specific antigen [PSA]: Secondary | ICD-10-CM | POA: Diagnosis not present

## 2023-06-20 DIAGNOSIS — N5201 Erectile dysfunction due to arterial insufficiency: Secondary | ICD-10-CM | POA: Diagnosis not present

## 2023-06-22 ENCOUNTER — Ambulatory Visit: Payer: Medicare Other | Attending: Cardiology | Admitting: Cardiology

## 2023-06-22 ENCOUNTER — Encounter: Payer: Self-pay | Admitting: Cardiology

## 2023-06-22 VITALS — BP 112/72 | HR 56 | Ht 72.0 in | Wt 159.2 lb

## 2023-06-22 DIAGNOSIS — I480 Paroxysmal atrial fibrillation: Secondary | ICD-10-CM

## 2023-06-22 DIAGNOSIS — E7800 Pure hypercholesterolemia, unspecified: Secondary | ICD-10-CM

## 2023-06-22 DIAGNOSIS — I639 Cerebral infarction, unspecified: Secondary | ICD-10-CM | POA: Diagnosis not present

## 2023-06-22 MED ORDER — DONEPEZIL HCL 5 MG PO TABS: 5.0000 mg | ORAL_TABLET | Freq: Every day | ORAL | Status: DC

## 2023-06-22 NOTE — Patient Instructions (Addendum)

## 2023-06-27 ENCOUNTER — Telehealth: Payer: Self-pay

## 2023-06-27 NOTE — Telephone Encounter (Signed)
Transition Care Management Unsuccessful Follow-up Telephone Call  Date of discharge and from where:  06/10/2023 [facility]  Attempts:  1st Attempt  Reason for unsuccessful TCM follow-up call:  Unable to reach patient patient will not be available until next week.  Kenzlie Disch [facility]  [facility] [place], [facility] Guide Direct Dial: [phone]  Website: [email or web address]

## 2023-07-02 ENCOUNTER — Telehealth: Payer: Self-pay

## 2023-07-02 NOTE — Telephone Encounter (Signed)
Transition Care Management Follow-up Telephone Call Date of discharge and from where: 06/10/2023 [facility] How have you been since you were released from the hospital? Per patient's spouse he is feeling much better. Any questions or concerns? No  Items Reviewed: Did the pt receive and understand the discharge instructions provided? Yes  Medications obtained and verified? Yes  Other? No  Any new allergies since your discharge? No  Dietary orders reviewed? Yes Do you have support at home? Yes   Follow up appointments reviewed:  PCP Hospital f/u appt confirmed?  Patient contacted PCP via phone  Scheduled to see Asencion Partridge. Neva Seat, MD on 06/13/2023 @ [facility] at [facility]. Specialist Hospital f/u appt confirmed?  Follow-up call from ED antibiotic change.  Scheduled to see Crystal M. Ranae Pila, RN on 06/13/2023 @ Post ED visit. Are transportation arrangements needed? No  If their condition worsens, is the pt aware to call PCP or go to the Emergency Dept.? Yes Was the patient provided with contact information for the PCP's office or ED? Yes Was to pt encouraged to call back with questions or concerns? Yes   Loann Chahal [facility]  [facility], [facility] Guide Direct Dial: [phone]  Website: [email or web address]

## 2023-07-04 DIAGNOSIS — K080 Exfoliation of teeth due to systemic causes: Secondary | ICD-10-CM | POA: Diagnosis not present

## 2023-07-18 ENCOUNTER — Other Ambulatory Visit: Payer: Self-pay | Admitting: Cardiology

## 2023-07-20 DIAGNOSIS — R972 Elevated prostate specific antigen [PSA]: Secondary | ICD-10-CM | POA: Diagnosis not present

## 2023-08-09 ENCOUNTER — Encounter: Payer: Self-pay | Admitting: Family Medicine

## 2023-08-09 ENCOUNTER — Ambulatory Visit (INDEPENDENT_AMBULATORY_CARE_PROVIDER_SITE_OTHER): Payer: Medicare Other | Admitting: Family Medicine

## 2023-08-09 ENCOUNTER — Telehealth: Payer: Self-pay

## 2023-08-09 VITALS — BP 112/70 | HR 77 | Temp 97.9°F | Ht 71.25 in | Wt 159.2 lb

## 2023-08-09 DIAGNOSIS — I4891 Unspecified atrial fibrillation: Secondary | ICD-10-CM

## 2023-08-09 DIAGNOSIS — J310 Chronic rhinitis: Secondary | ICD-10-CM

## 2023-08-09 DIAGNOSIS — Z0000 Encounter for general adult medical examination without abnormal findings: Secondary | ICD-10-CM | POA: Diagnosis not present

## 2023-08-09 DIAGNOSIS — R0989 Other specified symptoms and signs involving the circulatory and respiratory systems: Secondary | ICD-10-CM | POA: Diagnosis not present

## 2023-08-09 DIAGNOSIS — D582 Other hemoglobinopathies: Secondary | ICD-10-CM

## 2023-08-09 DIAGNOSIS — Z130 Encounter for screening for diseases of the blood and blood-forming organs and certain disorders involving the immune mechanism: Secondary | ICD-10-CM | POA: Diagnosis not present

## 2023-08-09 DIAGNOSIS — E7800 Pure hypercholesterolemia, unspecified: Secondary | ICD-10-CM | POA: Diagnosis not present

## 2023-08-09 DIAGNOSIS — G3184 Mild cognitive impairment, so stated: Secondary | ICD-10-CM

## 2023-08-09 LAB — COMPREHENSIVE METABOLIC PANEL
ALT: 37 U/L (ref 0–53)
AST: 39 U/L — ABNORMAL HIGH (ref 0–37)
Albumin: 4.7 g/dL (ref 3.5–5.2)
Alkaline Phosphatase: 56 U/L (ref 39–117)
BUN: 16 mg/dL (ref 6–23)
CO2: 31 meq/L (ref 19–32)
Calcium: 9.6 mg/dL (ref 8.4–10.5)
Chloride: 102 meq/L (ref 96–112)
Creatinine, Ser: 0.9 mg/dL (ref 0.40–1.50)
GFR: 87.42 mL/min (ref >60.00)
Glucose, Bld: 76 mg/dL (ref 70–99)
Potassium: 4 meq/L (ref 3.5–5.1)
Sodium: 141 meq/L (ref 135–145)
Total Bilirubin: 0.9 mg/dL (ref 0.2–1.2)
Total Protein: 7.6 g/dL (ref 6.0–8.3)

## 2023-08-09 LAB — LIPID PANEL
Cholesterol: 124 mg/dL (ref 0–200)
HDL: 48.6 mg/dL (ref >39.00)
LDL Cholesterol: 62 mg/dL (ref 0–99)
NonHDL: 75.2
Total CHOL/HDL Ratio: 3
Triglycerides: 65 mg/dL (ref 0.0–149.0)
VLDL: 13 mg/dL (ref 0.0–40.0)

## 2023-08-09 LAB — CBC WITH DIFFERENTIAL/PLATELET
Basophils Absolute: 0 10*3/uL (ref 0.0–0.1)
Basophils Relative: 0.4 % (ref 0.0–3.0)
Eosinophils Absolute: 0.1 10*3/uL (ref 0.0–0.7)
Eosinophils Relative: 1 % (ref 0.0–5.0)
HCT: 54.2 % — ABNORMAL HIGH (ref 39.0–52.0)
Hemoglobin: 18.7 g/dL (ref 13.0–17.0)
Lymphocytes Relative: 24.9 % (ref 12.0–46.0)
Lymphs Abs: 1.6 10*3/uL (ref 0.7–4.0)
MCHC: 34.4 g/dL (ref 30.0–36.0)
MCV: 91.7 fL (ref 78.0–100.0)
Monocytes Absolute: 0.7 10*3/uL (ref 0.1–1.0)
Monocytes Relative: 11.4 % (ref 3.0–12.0)
Neutro Abs: 3.9 10*3/uL (ref 1.4–7.7)
Neutrophils Relative %: 62.3 % (ref 43.0–77.0)
Platelets: 192 10*3/uL (ref 150.0–400.0)
RBC: 5.91 Mil/uL — ABNORMAL HIGH (ref 4.22–5.81)
RDW: 13.2 % (ref 11.5–15.5)
WBC: 6.2 10*3/uL (ref 4.0–10.5)

## 2023-08-09 MED ORDER — AZELASTINE HCL 0.1 % NA SOLN: 1.0000 | Freq: Two times a day (BID) | NASAL | 3 refills | Status: DC | PRN

## 2023-08-09 NOTE — Telephone Encounter (Signed)
 Lab calling with a critical Hemoglobin of 18.7 for this patient. Reported to Dr. Neva Seat

## 2023-08-09 NOTE — Progress Notes (Signed)
 Subjective:  Patient ID: Nicholas KATHEE Venetia Mickey., male    DOB: 1956/01/26  Age: 68 y.o. MRN: [id number]  CC:  Chief Complaint  Patient presents with   Annual Exam    Pt notes has a little cold rn but doing okay no need for action, pt is fasting     HPI Nicholas KATHEE Venetia Mickey. presents for Annual Exam  PCP, [facility], Dr. Matilda, now Dr. Nieves. Treated in the ER in November for complicated UTI.Urology appointment 06/25/2023, elevated PSA, 7.74, previously 1.49 in 2023.  Plan repeat testing 1 month.  Thought to be likely from UTI.  History of BPH, erectile dysfunction, repeat PSA reportedly normal range at urology.  Dermatology [facility]. Dr. Bonney.  Cardiology Dr. Jordan, paroxysmal atrial fibrillation/flutter.  Anticoagulated with Eliquis , diltiazem  daily for rate control, Crestor  for hyperlipidemia, also has cerebrovascular disease with history of subacute cortical infarct noted in 2023. Neurology Lauraine Sevin, PA-C.  Mild cognitive impairment.  Started Aricept  5 mg daily at  appointment in November, MMSE 29 out of 30.  Possible essential tremor noted at that time.  Continued monitoring planned, and repeat neuropsych eval.  Subacute cortical infarct on MRI in 2023.  Runny nose Runny nose for past few weeks.  No fever. No cough. No sneezing. Otherwise feels well. No headache. Coricidin with some relief. Worse with exposure to cold.   Hyperlipidemia: Crestor  10 mg daily without new myalgias or side effects. Lab Results  Component Value Date   CHOL 112 08/07/2022   HDL 48.20 08/07/2022   LDLCALC 52 08/07/2022   TRIG 61.0 08/07/2022   CHOLHDL 2 08/07/2022   Lab Results  Component Value Date   ALT 37 01/01/2023   AST 27 01/01/2023   ALKPHOS 45 01/01/2023   BILITOT 0.7 01/01/2023   Atrial fibrillation as above, denies any new bleeding with Eliquis , no new chest pain or heart palpitations. No new focal weakness, speech changes or facial droop.  Omeprazole  20 mg  daily for reflux/GERD, still taking daily. No breakthrough sx's.       08/09/2023    8:08 AM 02/21/2023    8:22 AM 12/07/2022   10:31 AM 12/07/2022   10:30 AM 09/19/2022    1:36 PM  Depression screen PHQ 2/9  Decreased Interest 0 0 0 0 0  Down, Depressed, Hopeless 0 0 0 0 0  PHQ - 2 Score 0 0 0 0 0  Altered sleeping 0 0 0 0 0  Tired, decreased energy 0 0 0 0 0  Change in appetite 0 0 0 0 0  Feeling bad or failure about yourself  0 0 0 0 0  Trouble concentrating 0 0 0  0  Moving slowly or fidgety/restless 0 0 0  0  Suicidal thoughts 0 0 0  0  PHQ-9 Score 0 0 0 0 0  Difficult doing work/chores   Not difficult at all  Not difficult at all   Health Maintenance  Topic Date Due   COVID-19 Vaccine (8 - 2024-25 season) 06/26/2023   Medicare Annual Wellness (AWV)  09/20/2023   Colonoscopy  07/12/2032   Pneumonia Vaccine 10+ Years old  Completed   INFLUENZA VACCINE  Completed   Hepatitis C Screening  Completed   Zoster Vaccines- Shingrix   Completed   HPV VACCINES  Aged Out   DTaP/Tdap/Td  Discontinued   Colonoscopy 06/2022 - repeat 5 years, Dr. Dianna.  Prostate:BPH - followed by urology with recent testing. UTI sx's resolved.  Lab Results  Component Value Date   PSA 1.38 10/29/2015   PSA 1.58 01/07/2015   PSA 1.62 06/01/2014    Immunization History  Administered Date(s) Administered   Fluad  Trivalent(High Dose 65+) 05/11/2023   Influenza Split 04/30/2012, 05/26/2016   Influenza,inj,Quad PF,6+ Mos 04/22/2013, 06/01/2014, 05/06/2015, 05/25/2017, 05/25/2018, 04/28/2019, 05/04/2022   Influenza-Unspecified 07/16/2020   PFIZER Comirnaty (Gray Top)Covid-19 Tri-Sucrose Vaccine 12/03/2020   PFIZER(Purple Top)SARS-COV-2 Vaccination 09/06/2019, 10/01/2019, 05/15/2020   Pfizer Covid-19 Vaccine Bivalent Booster 42yrs & up 05/18/2021, 06/05/2022   Pfizer(Comirnaty )Fall Seasonal Vaccine 12 years and older 06/05/2022, 05/01/2023   Pneumococcal Conjugate-13 08/02/2020   Pneumococcal  Polysaccharide-23 08/04/2021   Respiratory Syncytial Virus Vaccine ,Recomb Aduvanted(Arexvy ) 06/19/2022   Tdap 04/21/2008   Zoster Recombinant(Shingrix ) 06/27/2019, 11/22/2019   Zoster, Live 07/31/2014, 03/20/2015    No results found. Wears glasses. Yearly visits  Dental:Within Last 6 months every 3 month cleaning.   Alcohol: none  Tobacco: none  Exercise: walking on treadmill hour each morning. ' Lab Results  Component Value Date   HGBA1C 5.4 08/04/2021     New hearing aids working well.   History Patient Active Problem List   Diagnosis Date Noted   GERD (gastroesophageal reflux disease) 05/24/2022   Mild cognitive impairment 05/24/2022   Cerebrovascular disease    Lacunar infarction 09/2021   Hiatal hernia 08/02/2020   Paroxysmal atrial fibrillation (HCC) 10/24/2012   Hypercholesterolemia 04/03/2012   Erectile dysfunction 03/05/2012   Male hypogonadism 03/05/2012   Past Medical History:  Diagnosis Date   Abdominal pain    BCC (basal cell carcinoma) 02/05/2013   Recurrent, top right scalp, MOHs   Cerebrovascular disease    Chest pain    negative stress echo in September 2013   Contusion of right elbow 10/11/2016   Diverticulitis    Dysplastic nevus    moderate, Left back no treatment   GERD (gastroesophageal reflux disease)    Hiatal hernia 08/02/2020   Hypercholesterolemia 04/03/2012   Laceration of hand without foreign body 10/11/2016   Lacunar infarction 10/10/2021   MRI - subtle area of subacute gyriform enhancement in the left occipital pole   Male hypogonadism 03/05/2012   Mild cognitive impairment of uncertain or unknown etiology 05/24/2022   Paroxysmal atrial fibrillation 10/24/2012   Past Surgical History:  Procedure Laterality Date   COLON SURGERY     PARTIAL COLECTOMY  11/11   Allergies  Allergen Reactions   Doxycycline Rash   Penicillins     rash   Prior to Admission medications   Medication Sig Start Date End Date Taking? Authorizing  Provider  B-D 3CC LUER-LOK SYR 23GX1 23G X 1 3 ML MISC SMARTSIG:1 Injection Once a Week 02/06/20  Yes [provider]  B-D HYPODERMIC NEEDLE 18GX1.5 18G X 1-1/2 MISC USE TO DRAW UP MEDICATION 04/01/20  Yes [provider]  calcium  carbonate (OS-CAL - DOSED IN MG OF ELEMENTAL CALCIUM ) 1250 MG tablet Take 2 tablets by mouth daily with breakfast.   Yes [provider]  cholecalciferol (VITAMIN D ) 1000 UNITS tablet Take 1,000 Units by mouth daily.   Yes [provider]  cyanocobalamin  (VITAMIN B12) 500 MCG tablet Take 500 mcg by mouth daily.   Yes [provider]  diltiazem  (CARDIZEM  CD) 240 MG 24 hr capsule TAKE 1 CAPSULE(240 MG) BY MOUTH DAILY 03/22/23  Yes Jordan, Peter M, MD  Docusate Sodium (COLACE PO) Take 1 tablet by mouth daily as needed (constipation).   Yes [provider]  donepezil  (ARICEPT ) 5 MG tablet Take 1 tablet (5  mg total) by mouth daily. 06/22/23  Yes Jordan, Peter M, MD  ELIQUIS  5 MG TABS tablet TAKE 1 TABLET(5 MG) BY MOUTH TWICE DAILY 05/04/23  Yes Jordan, Peter M, MD  metroNIDAZOLE  (METROCREAM ) 0.75 % cream Apply 1 Application topically 2 (two) times daily. 06/13/22  Yes [provider]  Multiple Vitamin (MULTIVITAMIN) tablet Take 1 tablet by mouth daily.   Yes [provider]  Omega-3 Fatty Acids (OMEGA 3 PO) Take 2 capsules by mouth daily.    Yes [provider]  omeprazole  (PRILOSEC) 20 MG capsule Take 1 capsule (20 mg total) by mouth daily. 03/05/12  Yes Humberto Elspeth LABOR, MD  Probiotic Product (PROBIOTIC-10 PO) Take 1 tablet by mouth daily.   Yes [provider]  rosuvastatin  (CRESTOR ) 10 MG tablet TAKE 1 TABLET(10 MG) BY MOUTH DAILY 07/18/23  Yes Jordan, Peter M, MD  sildenafil  (VIAGRA ) 100 MG tablet Take 100 mg by mouth as needed. 08/30/21  Yes [provider]  testosterone  cypionate (DEPOTESTOSTERONE CYPIONATE) 200 MG/ML injection Inject into the muscle once a week.    Yes [provider]   Social History   Socioeconomic History   Marital status: Married    Spouse name: Not on file   Number of children: 0   Years of education: 12   Highest education level: Professional school degree (e.g., MD, DDS, DVM, JD)  Occupational History   Occupation: attorney    Employer: [employer]  Tobacco Use   Smoking status: Never   Smokeless tobacco: Never  Vaping Use   Vaping status: Never Used  Substance and Sexual Activity   Alcohol use: No   Drug use: No   Sexual activity: Yes  Other Topics Concern   Not on file  Social History Narrative   Married   Exercise: Yes   Education: College   Right handed    Decaffeine    Two story home   Social Drivers of Health   Financial Resource Strain: Low Risk  (12/04/2022)   Overall Financial Resource Strain (CARDIA)    Difficulty of Paying Living Expenses: Not hard at all  Food Insecurity: No Food Insecurity (12/04/2022)   Hunger Vital Sign    Worried About Running Out of Food in the Last Year: Never true    Ran Out of Food in the Last Year: Never true  Transportation Needs: No Transportation Needs (12/04/2022)   PRAPARE - Administrator, Civil Service (Medical): No    Lack of Transportation (Non-Medical): No  Physical Activity: Sufficiently Active (12/04/2022)   Exercise Vital Sign    Days of Exercise per Week: 6 days    Minutes of Exercise per Session: 60 min  Stress: No Stress Concern Present (12/04/2022)   [employer] of Occupational Health - Occupational Stress Questionnaire    Feeling of Stress : Not at all  Social Connections: Moderately Integrated (12/04/2022)   Social Connection and Isolation Panel [NHANES]    Frequency of Communication with Friends and Family: Once a week    Frequency of Social Gatherings with Friends and Family: Once a week    Attends Religious Services: More than 4 times per year    Active Member of [employer] or Organizations: Yes    Attends Banker Meetings:  More than 4 times per year    Marital Status: Married  Catering Manager Violence: Not At Risk (09/19/2022)   Humiliation, Afraid, Rape, and Kick questionnaire    Fear of Current or Ex-Partner:  No    Emotionally Abused: No    Physically Abused: No    Sexually Abused: No    Review of Systems 13 point review of systems per patient health survey noted.  Negative other than as indicated above or in HPI.    Objective:   Vitals:   08/09/23 0805  BP: 112/70  Pulse: 77  Temp: 97.9 F (36.6 C)  TempSrc: Temporal  SpO2: 100%  Weight: 159 lb 3.2 oz (72.2 kg)  Height: 5' 11.25 (1.81 m)     Physical Exam Vitals reviewed.  Constitutional:      Appearance: He is well-developed.  HENT:     Head: Normocephalic and atraumatic.     Right Ear: External ear normal.     Left Ear: External ear normal.     Nose: Rhinorrhea (min clear - R>L) present.  Eyes:     Conjunctiva/sclera: Conjunctivae normal.     Pupils: Pupils are equal, round, and reactive to light.  Neck:     Thyroid : No thyromegaly.  Cardiovascular:     Rate and Rhythm: Normal rate and regular rhythm.     Heart sounds: Normal heart sounds.  Pulmonary:     Effort: Pulmonary effort is normal. No respiratory distress.     Breath sounds: Normal breath sounds. No wheezing.  Abdominal:     General: There is no distension.     Palpations: Abdomen is soft.     Tenderness: There is no abdominal tenderness.  Musculoskeletal:        General: No tenderness. Normal range of motion.     Cervical back: Normal range of motion and neck supple.  Lymphadenopathy:     Cervical: No cervical adenopathy.  Skin:    General: Skin is warm and dry.  Neurological:     Mental Status: He is alert and oriented to person, place, and time.     Deep Tendon Reflexes: Reflexes are normal and symmetric.  Psychiatric:        Behavior: Behavior normal.        Assessment & Plan:  Nicholas West. is a 68 y.o. male . Annual physical exam - Plan:  CBC with Differential/Platelet, Comprehensive metabolic panel, Lipid panel  - -anticipatory guidance as below in AVS, screening labs above. Health maintenance items as above in HPI discussed/recommended as applicable.   Hypercholesterolemia - Plan: Comprehensive metabolic panel, Lipid panel  -Check labs, continue same dose Crestor  for now and follow-up with specialist as planned.  Screening for deficiency anemia - Plan: CBC with Differential/Platelet Atrial fibrillation, unspecified type (HCC) - Plan: CBC with Differential/Platelet  -On Eliquis , monitor CBC with use of anticoagulation.  Continue follow-up with specialist, denies new bleeding, palpitations or other new symptoms.  Runny nose Rhinitis, unspecified type - Plan: azelastine  (ASTELIN ) 0.1 % nasal spray  -Suspected vasomotor rhinitis.  Trial of Astelin  nasal spray.  Recheck 1 month.  Mild cognitive impairment  -Tolerating Aricept , plan for neuropsych testing, continue follow-up with memory specialist.   Meds ordered this encounter  Medications   azelastine  (ASTELIN ) 0.1 % nasal spray    Sig: Place 1-2 sprays into both nostrils 2 (two) times daily as needed for rhinitis. Use in each nostril as directed    Dispense:  30 mL    Refill:  3   Patient Instructions  Try the Astelin  nasal spray for runny nose.  Follow-up in 1 month, sooner if any new or worsening symptoms.  No other med changes at this time.  If any concerns on labs I will let you know.  Continue follow-up with specialists as planned.  Take care.   Preventive Care 73 Years and Older, Male Preventive care refers to lifestyle choices and visits with your health care provider that can promote health and wellness. Preventive care visits are also called wellness exams. What can I expect for my preventive care visit? Counseling During your preventive care visit, your health care provider may ask about your: Medical history, including: Past medical problems. Family medical  history. History of falls. Current health, including: Emotional well-being. Home life and relationship well-being. Sexual activity. Memory and ability to understand (cognition). Lifestyle, including: Alcohol, nicotine or tobacco, and drug use. Access to firearms. Diet, exercise, and sleep habits. Work and work astronomer. Sunscreen use. Safety issues such as seatbelt and bike helmet use. Physical exam Your health care provider will check your: Height and weight. These may be used to calculate your BMI (body mass index). BMI is a measurement that tells if you are at a healthy weight. Waist circumference. This measures the distance around your waistline. This measurement also tells if you are at a healthy weight and may help predict your risk of certain diseases, such as type 2 diabetes and high blood pressure. Heart rate and blood pressure. Body temperature. Skin for abnormal spots. What immunizations do I need?  Vaccines are usually given at various ages, according to a schedule. Your health care provider will recommend vaccines for you based on your age, medical history, and lifestyle or other factors, such as travel or where you work. What tests do I need? Screening Your health care provider may recommend screening tests for certain conditions. This may include: Lipid and cholesterol levels. Diabetes screening. This is done by checking your blood sugar (glucose) after you have not eaten for a while (fasting). Hepatitis C test. Hepatitis B test. HIV (human immunodeficiency virus) test. STI (sexually transmitted infection) testing, if you are at risk. Lung cancer screening. Colorectal cancer screening. Prostate cancer screening. Abdominal aortic aneurysm (AAA) screening. You may need this if you are a current or former smoker. Talk with your health care provider about your test results, treatment options, and if necessary, the need for more tests. Follow these instructions at  home: Eating and drinking  Eat a diet that includes fresh fruits and vegetables, whole grains, lean protein, and low-fat dairy products. Limit your intake of foods with high amounts of sugar, saturated fats, and salt. Take vitamin and mineral supplements as recommended by your health care provider. Do not drink alcohol if your health care provider tells you not to drink. If you drink alcohol: Limit how much you have to 0-2 drinks a day. Know how much alcohol is in your drink. In the [place] one drink equals one 12 oz bottle of beer (355 mL), one 5 oz glass of wine (148 mL), or one 1 oz glass of hard liquor (44 mL). Lifestyle Brush your teeth every morning and night with fluoride toothpaste. Floss one time each day. Exercise for at least 30 minutes 5 or more days each week. Do not use any products that contain nicotine or tobacco. These products include cigarettes, chewing tobacco, and vaping devices, such as e-cigarettes. If you need help quitting, ask your health care provider. Do not use drugs. If you are sexually active, practice safe sex. Use a condom or other form of protection to prevent STIs. Take aspirin only as told by your health care provider. Make sure that you  understand how much to take and what form to take. Work with your health care provider to find out whether it is safe and beneficial for you to take aspirin daily. Ask your health care provider if you need to take a cholesterol-lowering medicine (statin). Find healthy ways to manage stress, such as: Meditation, yoga, or listening to music. Journaling. Talking to a trusted person. Spending time with friends and family. Safety Always wear your seat belt while driving or riding in a vehicle. Do not drive: If you have been drinking alcohol. Do not ride with someone who has been drinking. When you are tired or distracted. While texting. If you have been using any mind-altering substances or drugs. Wear a helmet and other  protective equipment during sports activities. If you have firearms in your house, make sure you follow all gun safety procedures. Minimize exposure to UV radiation to reduce your risk of skin cancer. What's next? Visit your health care provider once a year for an annual wellness visit. Ask your health care provider how often you should have your eyes and teeth checked. Stay up to date on all vaccines. This information is not intended to replace advice given to you by your health care provider. Make sure you discuss any questions you have with your health care provider. Document Revised: 01/12/2021 Document Reviewed: 01/12/2021 Elsevier Patient Education  2024 Elsevier Inc.     Signed,   Reyes Pines, MD [facility], [facility] 08/09/23 8:45 AM

## 2023-08-09 NOTE — Telephone Encounter (Signed)
 Pt has been scheduled for follow up lab and has voiced understanding

## 2023-08-09 NOTE — Patient Instructions (Signed)
 Try the Astelin  nasal spray for runny nose.  Follow-up in 1 month, sooner if any new or worsening symptoms.  No other med changes at this time.  If any concerns on labs I will let you know.  Continue follow-up with specialists as planned.  Take care.   Preventive Care 74 Years and Older, Male Preventive care refers to lifestyle choices and visits with your health care provider that can promote health and wellness. Preventive care visits are also called wellness exams. What can I expect for my preventive care visit? Counseling During your preventive care visit, your health care provider may ask about your: Medical history, including: Past medical problems. Family medical history. History of falls. Current health, including: Emotional well-being. Home life and relationship well-being. Sexual activity. Memory and ability to understand (cognition). Lifestyle, including: Alcohol, nicotine or tobacco, and drug use. Access to firearms. Diet, exercise, and sleep habits. Work and work astronomer. Sunscreen use. Safety issues such as seatbelt and bike helmet use. Physical exam Your health care provider will check your: Height and weight. These may be used to calculate your BMI (body mass index). BMI is a measurement that tells if you are at a healthy weight. Waist circumference. This measures the distance around your waistline. This measurement also tells if you are at a healthy weight and may help predict your risk of certain diseases, such as type 2 diabetes and high blood pressure. Heart rate and blood pressure. Body temperature. Skin for abnormal spots. What immunizations do I need?  Vaccines are usually given at various ages, according to a schedule. Your health care provider will recommend vaccines for you based on your age, medical history, and lifestyle or other factors, such as travel or where you work. What tests do I need? Screening Your health care provider may recommend  screening tests for certain conditions. This may include: Lipid and cholesterol levels. Diabetes screening. This is done by checking your blood sugar (glucose) after you have not eaten for a while (fasting). Hepatitis C test. Hepatitis B test. HIV (human immunodeficiency virus) test. STI (sexually transmitted infection) testing, if you are at risk. Lung cancer screening. Colorectal cancer screening. Prostate cancer screening. Abdominal aortic aneurysm (AAA) screening. You may need this if you are a current or former smoker. Talk with your health care provider about your test results, treatment options, and if necessary, the need for more tests. Follow these instructions at home: Eating and drinking  Eat a diet that includes fresh fruits and vegetables, whole grains, lean protein, and low-fat dairy products. Limit your intake of foods with high amounts of sugar, saturated fats, and salt. Take vitamin and mineral supplements as recommended by your health care provider. Do not drink alcohol if your health care provider tells you not to drink. If you drink alcohol: Limit how much you have to 0-2 drinks a day. Know how much alcohol is in your drink. In the [place] one drink equals one 12 oz bottle of beer (355 mL), one 5 oz glass of wine (148 mL), or one 1 oz glass of hard liquor (44 mL). Lifestyle Brush your teeth every morning and night with fluoride toothpaste. Floss one time each day. Exercise for at least 30 minutes 5 or more days each week. Do not use any products that contain nicotine or tobacco. These products include cigarettes, chewing tobacco, and vaping devices, such as e-cigarettes. If you need help quitting, ask your health care provider. Do not use drugs. If you are sexually active,  practice safe sex. Use a condom or other form of protection to prevent STIs. Take aspirin only as told by your health care provider. Make sure that you understand how much to take and what form to  take. Work with your health care provider to find out whether it is safe and beneficial for you to take aspirin daily. Ask your health care provider if you need to take a cholesterol-lowering medicine (statin). Find healthy ways to manage stress, such as: Meditation, yoga, or listening to music. Journaling. Talking to a trusted person. Spending time with friends and family. Safety Always wear your seat belt while driving or riding in a vehicle. Do not drive: If you have been drinking alcohol. Do not ride with someone who has been drinking. When you are tired or distracted. While texting. If you have been using any mind-altering substances or drugs. Wear a helmet and other protective equipment during sports activities. If you have firearms in your house, make sure you follow all gun safety procedures. Minimize exposure to UV radiation to reduce your risk of skin cancer. What's next? Visit your health care provider once a year for an annual wellness visit. Ask your health care provider how often you should have your eyes and teeth checked. Stay up to date on all vaccines. This information is not intended to replace advice given to you by your health care provider. Make sure you discuss any questions you have with your health care provider. Document Revised: 01/12/2021 Document Reviewed: 01/12/2021 Elsevier Patient Education  2024 [employer].

## 2023-08-09 NOTE — Telephone Encounter (Signed)
 Critical lab noted, hemoglobin 18.7.  This is elevated from his most recent hemoglobin of 16 in November.  Please have him return for repeat testing in the next 1 week.  Make sure he is well-hydrated at that time.

## 2023-08-10 ENCOUNTER — Telehealth: Payer: Self-pay

## 2023-08-10 NOTE — Telephone Encounter (Signed)
-----   Message from Reyes JONELLE Pines sent at 08/09/2023  5:54 PM EST ----- See phone call from stat lab called today about action plan for CBC.  Also please advise patient that one of the liver tests was borderline elevated but overall electrolytes look okay and we can recheck that with future labs.  Cholesterol levels look okay.  Let me know if there are questions.

## 2023-08-10 NOTE — Telephone Encounter (Signed)
 See phone call from [facility] lab called today about action plan for CBC.  Also please advise patient that one of the liver tests was borderline elevated but overall electrolytes look okay and we can recheck that with future labs.  Cholesterol levels look okay.  Let me know if there are questions.

## 2023-08-13 NOTE — Telephone Encounter (Signed)
 Patient has been advised of the results as well as wife and they are interested to see repeat

## 2023-08-13 NOTE — Telephone Encounter (Signed)
 Copied from CRM [id number]. Topic: Clinical - Lab/Test Results >> Aug 10, 2023  3:52 PM Nicholas West B wrote: Reason for CRM: Pt stated that Nicholas West gave him a call regarding his test results and like to request a callback

## 2023-08-16 ENCOUNTER — Other Ambulatory Visit (INDEPENDENT_AMBULATORY_CARE_PROVIDER_SITE_OTHER): Payer: Medicare Other

## 2023-08-16 DIAGNOSIS — D582 Other hemoglobinopathies: Secondary | ICD-10-CM

## 2023-08-16 LAB — CBC
HCT: 48.7 % (ref 39.0–52.0)
Hemoglobin: 16.8 g/dL (ref 13.0–17.0)
MCHC: 34.5 g/dL (ref 30.0–36.0)
MCV: 91.3 fL (ref 78.0–100.0)
Platelets: 158 10*3/uL (ref 150.0–400.0)
RBC: 5.34 Mil/uL (ref 4.22–5.81)
RDW: 13.1 % (ref 11.5–15.5)
WBC: 5.7 10*3/uL (ref 4.0–10.5)

## 2023-09-06 DIAGNOSIS — H5213 Myopia, bilateral: Secondary | ICD-10-CM | POA: Diagnosis not present

## 2023-09-10 ENCOUNTER — Ambulatory Visit: Payer: Medicare Other | Admitting: Family Medicine

## 2023-09-10 ENCOUNTER — Encounter: Payer: Self-pay | Admitting: Family Medicine

## 2023-09-10 VITALS — BP 128/74 | HR 58 | Temp 98.8°F | Ht 71.5 in | Wt 166.6 lb

## 2023-09-10 DIAGNOSIS — J300 Vasomotor rhinitis: Secondary | ICD-10-CM | POA: Diagnosis not present

## 2023-09-10 NOTE — Progress Notes (Signed)
 Subjective:  Patient ID: Nicholas Cowden., male    DOB: April 11, 1956  Age: 68 y.o. MRN: [id number]  CC:  Chief Complaint  Patient presents with   Nasal Congestion    Notes better but not completely resolved, notes did find out that a new medication has a side effect of runny nose,     HPI Nicholas West. presents for   Follow-up of January 9th physical.  Rhinitis Suspected vasomotor rhinitis at his January 9 visit.  Started on Astelin  nasal spray. Has noticed some benefit. Still some residual sinus drainage.  2 sprays daily prior - minimal burn, but tolerated. Has missed some days.  No fever, HA, face pain. No dyspnea.  Noticed with cold weather exposure.   History Patient Active Problem List   Diagnosis Date Noted   GERD (gastroesophageal reflux disease) 05/24/2022   Mild cognitive impairment 05/24/2022   Cerebrovascular disease    Lacunar infarction 09/2021   Hiatal hernia 08/02/2020   Paroxysmal atrial fibrillation (HCC) 10/24/2012   Hypercholesterolemia 04/03/2012   Erectile dysfunction 03/05/2012   Male hypogonadism 03/05/2012   Past Medical History:  Diagnosis Date   Abdominal pain    BCC (basal cell carcinoma) 02/05/2013   Recurrent, top right scalp, MOHs   Cerebrovascular disease    Chest pain    negative stress echo in September 2013   Contusion of right elbow 10/11/2016   Diverticulitis    Dysplastic nevus    moderate, Left back no treatment   GERD (gastroesophageal reflux disease)    Hiatal hernia 08/02/2020   Hypercholesterolemia 04/03/2012   Laceration of hand without foreign body 10/11/2016   Lacunar infarction 10/10/2021   MRI - subtle area of subacute gyriform enhancement in the left occipital pole   Male hypogonadism 03/05/2012   Mild cognitive impairment of uncertain or unknown etiology 05/24/2022   Paroxysmal atrial fibrillation 10/24/2012   Past Surgical History:  Procedure Laterality Date   COLON SURGERY     PARTIAL COLECTOMY   11/11   Allergies  Allergen Reactions   Doxycycline Rash   Penicillins     rash   Prior to Admission medications   Medication Sig Start Date End Date Taking? Authorizing Provider  azelastine  (ASTELIN ) 0.1 % nasal spray Place 1-2 sprays into both nostrils 2 (two) times daily as needed for rhinitis. Use in each nostril as directed 08/09/23   Benjiman Bras, MD  B-D 3CC LUER-LOK SYR 23GX1" 23G X 1" 3 ML MISC SMARTSIG:1 Injection Once a Week 02/06/20   [provider]  B-D HYPODERMIC NEEDLE 18GX1.5" 18G X 1-1/2" MISC USE TO DRAW UP MEDICATION 04/01/20   [provider]  calcium  carbonate (OS-CAL - DOSED IN MG OF ELEMENTAL CALCIUM ) 1250 MG tablet Take 2 tablets by mouth daily with breakfast.    [provider]  cholecalciferol (VITAMIN D ) 1000 UNITS tablet Take 1,000 Units by mouth daily.    [provider]  cyanocobalamin  (VITAMIN B12) 500 MCG tablet Take 500 mcg by mouth daily.    [provider]  diltiazem  (CARDIZEM  CD) 240 MG 24 hr capsule TAKE 1 CAPSULE(240 MG) BY MOUTH DAILY 03/22/23   [place], Peter M, MD  Docusate Sodium (COLACE PO) Take 1 tablet by mouth daily as needed (constipation).    [provider]  donepezil  (ARICEPT ) 5 MG tablet Take 1 tablet (5 mg total) by mouth daily. 06/22/23   [place], Peter M, MD  ELIQUIS  5 MG TABS tablet TAKE 1 TABLET(5 MG)  BY MOUTH TWICE DAILY 05/04/23   [place], Peter M, MD  metroNIDAZOLE  (METROCREAM ) 0.75 % cream Apply 1 Application topically 2 (two) times daily. 06/13/22   [provider]  Multiple Vitamin (MULTIVITAMIN) tablet Take 1 tablet by mouth daily.    [provider]  Omega-3 Fatty Acids (OMEGA 3 PO) Take 2 capsules by mouth daily.     [provider]  omeprazole  (PRILOSEC) 20 MG capsule Take 1 capsule (20 mg total) by mouth daily. 03/05/12   Kandy Orris, MD  Probiotic Product (PROBIOTIC-10 PO) Take 1 tablet by mouth daily.    [provider]  rosuvastatin   (CRESTOR ) 10 MG tablet TAKE 1 TABLET(10 MG) BY MOUTH DAILY 07/18/23   [place], Peter M, MD  sildenafil  (VIAGRA ) 100 MG tablet Take 100 mg by mouth as needed. 08/30/21   [provider]  testosterone  cypionate (DEPOTESTOSTERONE CYPIONATE) 200 MG/ML injection Inject into the muscle once a week.     [provider]   Social History   Socioeconomic History   Marital status: Married    Spouse name: Not on file   Number of children: 0   Years of education: 41   Highest education level: Professional school degree (e.g., MD, DDS, DVM, JD)  Occupational History   Occupation: attorney    Employer: [employer]  Tobacco Use   Smoking status: Never   Smokeless tobacco: Never  Vaping Use   Vaping status: Never Used  Substance and Sexual Activity   Alcohol use: No   Drug use: No   Sexual activity: Yes  Other Topics Concern   Not on file  Social History Narrative   Married   Exercise: Yes   Education: College   Right handed    Decaffeine    Two story home   Social Drivers of Health   Financial Resource Strain: Low Risk  (09/06/2023)   Overall Financial Resource Strain (CARDIA)    Difficulty of Paying Living Expenses: Not hard at all  Food Insecurity: No Food Insecurity (09/06/2023)   Hunger Vital Sign    Worried About Running Out of Food in the Last Year: Never true    Ran Out of Food in the Last Year: Never true  Transportation Needs: No Transportation Needs (09/06/2023)   PRAPARE - Administrator, Civil Service (Medical): No    Lack of Transportation (Non-Medical): No  Physical Activity: Sufficiently Active (09/06/2023)   Exercise Vital Sign    Days of Exercise per Week: 6 days    Minutes of Exercise per Session: 60 min  Stress: No Stress Concern Present (09/06/2023)   Nicholas West    Feeling of Stress : Not at all  Social Connections: Moderately Integrated (09/06/2023)   Social Connection and  Isolation Panel [NHANES]    Frequency of Communication with Friends and Family: Once a week    Frequency of Social Gatherings with Friends and Family: Once a week    Attends Religious Services: More than 4 times per year    Active Member of [employer] or Organizations: Yes    Attends Banker Meetings: More than 4 times per year    Marital Status: Married  Catering manager Violence: Not At Risk (09/19/2022)   Humiliation, Afraid, Rape, and Kick West    Fear of Current or Ex-Partner: No    Emotionally Abused: No    Physically Abused: No    Sexually Abused: No  Review of Systems Per HPI.   Objective:   Vitals:   09/10/23 0950  BP: 128/74  Pulse: (!) 58  Temp: 98.8 F (37.1 C)  TempSrc: Temporal  SpO2: 100%  Weight: 166 lb 9.6 oz (75.6 kg)  Height: 5' 11.5" (1.816 m)    Physical Exam Vitals reviewed.  Constitutional:      Appearance: He is well-developed.  HENT:     Head: Normocephalic and atraumatic.     Right Ear: External ear normal.     Left Ear: External ear normal.     Nose: No rhinorrhea.     Mouth/Throat:     Pharynx: No oropharyngeal exudate or posterior oropharyngeal erythema.  Eyes:     Conjunctiva/sclera: Conjunctivae normal.     Pupils: Pupils are equal, round, and reactive to light.  Cardiovascular:     Rate and Rhythm: Normal rate and regular rhythm.     Heart sounds: Normal heart sounds. No murmur heard. Pulmonary:     Effort: Pulmonary effort is normal.     Breath sounds: Normal breath sounds. No wheezing, rhonchi or rales.  Abdominal:     Palpations: Abdomen is soft.     Tenderness: There is no abdominal tenderness.  Musculoskeletal:     Cervical back: Neck supple.  Lymphadenopathy:     Cervical: No cervical adenopathy.  Skin:    General: Skin is warm and dry.     Findings: No rash.  Neurological:     Mental Status: He is alert and oriented to person, place, and time.  Psychiatric:        Behavior: Behavior normal.         Assessment & Plan:  Nicholas Milch. is a 68 y.o. male . Vasomotor rhinitis Improved.  Has noticed some improved symptoms just after use of Astelin  nasal spray.  Option of 1 spray per nostril and twice daily dosing.  RTC precautions, 59-month follow-up for repeat labs and chronic condition assessment.   No orders of the defined types were placed in this encounter.  There are no Patient Instructions on file for this visit.    Signed,   Caro Christmas, MD [facility], [facility] 09/10/23 10:20 AM

## 2023-10-09 ENCOUNTER — Ambulatory Visit (INDEPENDENT_AMBULATORY_CARE_PROVIDER_SITE_OTHER): Payer: Medicare Other | Admitting: *Deleted

## 2023-10-09 DIAGNOSIS — Z0000 Encounter for general adult medical examination without abnormal findings: Secondary | ICD-10-CM

## 2023-10-09 NOTE — Patient Instructions (Signed)
 [name] , Thank you for taking time to come for your Medicare Wellness Visit. I appreciate your ongoing commitment to your health goals. Please review the following plan we discussed and let me know if I can assist you in the future.   Screening recommendations/referrals: Colonoscopy: Education provided Recommended yearly ophthalmology/optometry visit for glaucoma screening and checkup Recommended yearly dental visit for hygiene and checkup  Vaccinations: Influenza vaccine: up to date Pneumococcal vaccine: up to date Tdap vaccine: Education provided Shingles vaccine: up to date       Preventive Care 65 Years and Older, Male Preventive care refers to lifestyle choices and visits with your health care provider that can promote health and wellness. What does preventive care include? A yearly physical exam. This is also called an annual well check. Dental exams once or twice a year. Routine eye exams. Ask your health care provider how often you should have your eyes checked. Personal lifestyle choices, including: Daily care of your teeth and gums. Regular physical activity. Eating a healthy diet. Avoiding tobacco and drug use. Limiting alcohol use. Practicing safe sex. Taking low doses of aspirin every day. Taking vitamin and mineral supplements as recommended by your health care provider. What happens during an annual well check? The services and screenings done by your health care provider during your annual well check will depend on your age, overall health, lifestyle risk factors, and family history of disease. Counseling  Your health care provider may ask you questions about your: Alcohol use. Tobacco use. Drug use. Emotional well-being. Home and relationship well-being. Sexual activity. Eating habits. History of falls. Memory and ability to understand (cognition). Work and work Astronomer. Screening  You may have the following tests or measurements: Height,  weight, and BMI. Blood pressure. Lipid and cholesterol levels. These may be checked every 5 years, or more frequently if you are over 68 years old. Skin check. Lung cancer screening. You may have this screening every year starting at age 14 if you have a 30-pack-year history of smoking and currently smoke or have quit within the past 15 years. Fecal occult blood test (FOBT) of the stool. You may have this test every year starting at age 49. Flexible sigmoidoscopy or colonoscopy. You may have a sigmoidoscopy every 5 years or a colonoscopy every 10 years starting at age 62. Prostate cancer screening. Recommendations will vary depending on your family history and other risks. Hepatitis C blood test. Hepatitis B blood test. Sexually transmitted disease (STD) testing. Diabetes screening. This is done by checking your blood sugar (glucose) after you have not eaten for a while (fasting). You may have this done every 1-3 years. Abdominal aortic aneurysm (AAA) screening. You may need this if you are a current or former smoker. Osteoporosis. You may be screened starting at age 24 if you are at high risk. Talk with your health care provider about your test results, treatment options, and if necessary, the need for more tests. Vaccines  Your health care provider may recommend certain vaccines, such as: Influenza vaccine. This is recommended every year. Tetanus, diphtheria, and acellular pertussis (Tdap, Td) vaccine. You may need a Td booster every 10 years. Zoster vaccine. You may need this after age 70. Pneumococcal 13-valent conjugate (PCV13) vaccine. One dose is recommended after age 32. Pneumococcal polysaccharide (PPSV23) vaccine. One dose is recommended after age 54. Talk to your health care provider about which screenings and vaccines you need and how often you need them. This information is not intended to  replace advice given to you by your health care provider. Make sure you discuss any  questions you have with your health care provider. Document Released: 08/13/2015 Document Revised: 04/05/2016 Document Reviewed: 05/18/2015 Elsevier Interactive Patient Education  2017 [employer].  Fall Prevention in the Home Falls can cause injuries. They can happen to people of all ages. There are many things you can do to make your home safe and to help prevent falls. What can I do on the outside of my home? Regularly fix the edges of walkways and driveways and fix any cracks. Remove anything that might make you trip as you walk through a door, such as a raised step or threshold. Trim any bushes or trees on the path to your home. Use bright outdoor lighting. Clear any walking paths of anything that might make someone trip, such as rocks or tools. Regularly check to see if handrails are loose or broken. Make sure that both sides of any steps have handrails. Any raised decks and porches should have guardrails on the edges. Have any leaves, snow, or ice cleared regularly. Use sand or salt on walking paths during winter. Clean up any spills in your garage right away. This includes oil or grease spills. What can I do in the bathroom? Use night lights. Install grab bars by the toilet and in the tub and shower. Do not use towel bars as grab bars. Use non-skid mats or decals in the tub or shower. If you need to sit down in the shower, use a plastic, non-slip stool. Keep the floor dry. Clean up any water that spills on the floor as soon as it happens. Remove soap buildup in the tub or shower regularly. Attach bath mats securely with double-sided non-slip rug tape. Do not have throw rugs and other things on the floor that can make you trip. What can I do in the bedroom? Use night lights. Make sure that you have a light by your bed that is easy to reach. Do not use any sheets or blankets that are too big for your bed. They should not hang down onto the floor. Have a firm chair that has side  arms. You can use this for support while you get dressed. Do not have throw rugs and other things on the floor that can make you trip. What can I do in the kitchen? Clean up any spills right away. Avoid walking on wet floors. Keep items that you use a lot in easy-to-reach places. If you need to reach something above you, use a strong step stool that has a grab bar. Keep electrical cords out of the way. Do not use floor polish or wax that makes floors slippery. If you must use wax, use non-skid floor wax. Do not have throw rugs and other things on the floor that can make you trip. What can I do with my stairs? Do not leave any items on the stairs. Make sure that there are handrails on both sides of the stairs and use them. Fix handrails that are broken or loose. Make sure that handrails are as long as the stairways. Check any carpeting to make sure that it is firmly attached to the stairs. Fix any carpet that is loose or worn. Avoid having throw rugs at the top or bottom of the stairs. If you do have throw rugs, attach them to the floor with carpet tape. Make sure that you have a light switch at the top of the stairs and the  bottom of the stairs. If you do not have them, ask someone to add them for you. What else can I do to help prevent falls? Wear shoes that: Do not have high heels. Have rubber bottoms. Are comfortable and fit you well. Are closed at the toe. Do not wear sandals. If you use a stepladder: Make sure that it is fully opened. Do not climb a closed stepladder. Make sure that both sides of the stepladder are locked into place. Ask someone to hold it for you, if possible. Clearly mark and make sure that you can see: Any grab bars or handrails. First and last steps. Where the edge of each step is. Use tools that help you move around (mobility aids) if they are needed. These include: Canes. Walkers. Scooters. Crutches. Turn on the lights when you go into a dark area.  Replace any light bulbs as soon as they burn out. Set up your furniture so you have a clear path. Avoid moving your furniture around. If any of your floors are uneven, fix them. If there are any pets around you, be aware of where they are. Review your medicines with your doctor. Some medicines can make you feel dizzy. This can increase your chance of falling. Ask your doctor what other things that you can do to help prevent falls. This information is not intended to replace advice given to you by your health care provider. Make sure you discuss any questions you have with your health care provider. Document Released: 05/13/2009 Document Revised: 12/23/2015 Document Reviewed: 08/21/2014 Elsevier Interactive Patient Education  2017 [employer].

## 2023-10-09 NOTE — Progress Notes (Signed)
 Subjective:   Nicholas West. is a 68 y.o. male who presents for Medicare Annual/Subsequent preventive examination.  Visit Complete: Virtual I connected with  [name]. on 10/09/23 by a audio enabled telemedicine application and verified that I am speaking with the correct person using two identifiers.  Patient Location: Home  Provider Location: Home Office  I discussed the limitations of evaluation and management by telemedicine. The patient expressed understanding and agreed to proceed.  Vital Signs: Because this visit was a virtual/telehealth visit, some criteria may be missing or patient reported. Any vitals not documented were not able to be obtained and vitals that have been documented are patient reported.  Patient Medicare AWV questionnaire was completed by the patient on 10-08-2023; I have confirmed that all information answered by patient is correct and no changes since this date.  Cardiac Risk Factors include: advanced age (>62men, >27 women);male gender     Objective:    There were no vitals filed for this visit. There is no height or weight on file to calculate BMI.     10/09/2023   11:37 AM 06/15/2023   11:32 AM 06/10/2023    9:07 AM 01/01/2023   11:19 AM 09/19/2022    1:41 PM 06/14/2022    9:31 AM 12/20/2021    8:54 AM  Advanced Directives  Does Patient Have a Medical Advance Directive? Yes Yes Yes Yes Yes Yes Yes  Type of Sales promotion account executive of [employer] Power of [place];Living will  Healthcare Power of [place];Living will Healthcare Power of [place];Living will Living will;Healthcare Power of Attorney  Does patient want to make changes to medical advance directive?  No - Patient declined No - Patient declined No - Patient declined     Copy of Healthcare Power of Attorney in Chart? No - copy requested No - copy requested No - copy requested  No - copy requested      Current Medications  (verified) Outpatient Encounter Medications as of 10/09/2023  Medication Sig   azelastine (ASTELIN) 0.1 % nasal spray Place 1-2 sprays into both nostrils 2 (two) times daily as needed for rhinitis. Use in each nostril as directed   B-D 3CC LUER-LOK SYR 23GX1" 23G X 1" 3 ML MISC SMARTSIG:1 Injection Once a Week   B-D HYPODERMIC NEEDLE 18GX1.5" 18G X 1-1/2" MISC USE TO DRAW UP MEDICATION   calcium carbonate (OS-CAL - DOSED IN MG OF ELEMENTAL CALCIUM) 1250 MG tablet Take 2 tablets by mouth daily with breakfast.   cholecalciferol (VITAMIN D) 1000 UNITS tablet Take 1,000 Units by mouth daily.   cyanocobalamin (VITAMIN B12) 500 MCG tablet Take 500 mcg by mouth daily.   diltiazem (CARDIZEM CD) 240 MG 24 hr capsule TAKE 1 CAPSULE(240 MG) BY MOUTH DAILY   Docusate Sodium (COLACE PO) Take 1 tablet by mouth daily as needed (constipation).   donepezil (ARICEPT) 5 MG tablet Take 1 tablet (5 mg total) by mouth daily.   ELIQUIS 5 MG TABS tablet TAKE 1 TABLET(5 MG) BY MOUTH TWICE DAILY   metroNIDAZOLE (METROCREAM) 0.75 % cream Apply 1 Application topically 2 (two) times daily.   Multiple Vitamin (MULTIVITAMIN) tablet Take 1 tablet by mouth daily.   Omega-3 Fatty Acids (OMEGA 3 PO) Take 2 capsules by mouth daily.    omeprazole (PRILOSEC) 20 MG capsule Take 1 capsule (20 mg total) by mouth daily.   Probiotic Product (PROBIOTIC-10 PO) Take 1 tablet by mouth daily.   rosuvastatin (CRESTOR)  10 MG tablet TAKE 1 TABLET(10 MG) BY MOUTH DAILY   sildenafil (VIAGRA) 100 MG tablet Take 100 mg by mouth as needed.   testosterone cypionate (DEPOTESTOSTERONE CYPIONATE) 200 MG/ML injection Inject into the muscle once a week.    No facility-administered encounter medications on file as of 10/09/2023.    Allergies (verified) Doxycycline and Penicillins   History: Past Medical History:  Diagnosis Date   Abdominal pain    BCC (basal cell carcinoma) 02/05/2013   Recurrent, top right scalp, MOHs   Cerebrovascular disease     Chest pain    negative stress echo in September 2013   Contusion of right elbow 10/11/2016   Diverticulitis    Dysplastic nevus    moderate, Left back no treatment   GERD (gastroesophageal reflux disease)    Hiatal hernia 08/02/2020   Hypercholesterolemia 04/03/2012   Laceration of hand without foreign body 10/11/2016   Lacunar infarction 10/10/2021   MRI - subtle area of subacute gyriform enhancement in the left occipital pole   Male hypogonadism 03/05/2012   Mild cognitive impairment of uncertain or unknown etiology 05/24/2022   Paroxysmal atrial fibrillation 10/24/2012   Past Surgical History:  Procedure Laterality Date   COLON SURGERY     PARTIAL COLECTOMY  11/11   Family History  Problem Relation Age of Onset   Cancer Mother    Alzheimer's disease Father        onset in mid-60s   Colon polyps Father    GER disease Father    Dementia Sister        unspecified type; rapidly progressing   Heart disease Paternal Grandmother    Cancer Paternal Grandfather    Social History   Socioeconomic History   Marital status: Married    Spouse name: Not on file   Number of children: 0   Years of education: 57   Highest education level: Professional school degree (e.g., MD, DDS, DVM, JD)  Occupational History   Occupation: attorney    Employer: [employer]  Tobacco Use   Smoking status: Never   Smokeless tobacco: Never  Vaping Use   Vaping status: Never Used  Substance and Sexual Activity   Alcohol use: No   Drug use: No   Sexual activity: Yes  Other Topics Concern   Not on file  Social History Narrative   Married   Exercise: Yes   Education: College   Right handed    Decaffeine    Two story home   Social Drivers of Health   Financial Resource Strain: Low Risk  (10/09/2023)   Overall Financial Resource Strain (CARDIA)    Difficulty of Paying Living Expenses: Not hard at all  Food Insecurity: No Food Insecurity (10/09/2023)   Hunger Vital Sign    Worried  About Running Out of Food in the Last Year: Never true    Ran Out of Food in the Last Year: Never true  Transportation Needs: No Transportation Needs (10/09/2023)   PRAPARE - Administrator, Civil Service (Medical): No    Lack of Transportation (Non-Medical): No  Physical Activity: Sufficiently Active (10/09/2023)   Exercise Vital Sign    Days of Exercise per Week: 6 days    Minutes of Exercise per Session: 60 min  Stress: No Stress Concern Present (10/09/2023)   [employer] of Occupational Health - Occupational Stress Questionnaire    Feeling of Stress : Not at all  Social Connections: Moderately Integrated (10/09/2023)   Social Connection  and Isolation Panel [NHANES]    Frequency of Communication with Friends and Family: Once a week    Frequency of Social Gatherings with Friends and Family: Once a week    Attends Religious Services: More than 4 times per year    Active Member of [employer] or Organizations: Yes    Attends Engineer, structural: More than 4 times per year    Marital Status: Married    Tobacco Counseling Counseling given: Not Answered   Clinical Intake:  Pre-visit preparation completed: Yes  Pain : No/denies pain     Diabetes: No  How often do you need to have someone help you when you read instructions, pamphlets, or other written materials from your doctor or pharmacy?: 1 - Never  Interpreter Needed?: No  Information entered by :: Remi Haggard LPN   Activities of Daily Living    10/09/2023   11:35 AM 10/08/2023    3:00 PM  In your present state of health, do you have any difficulty performing the following activities:  Hearing? 1 1  Vision? 0 0  Difficulty concentrating or making decisions? 1 1  Walking or climbing stairs? 0 0  Dressing or bathing? 0 0  Doing errands, shopping? 0 0  Preparing Food and eating ? N N  Using the Toilet? N N  In the past six months, have you accidently leaked urine? N N  Do you have problems with  loss of bowel control? N N  Managing your Medications? N N  Managing your Finances? N N  Housekeeping or managing your Housekeeping? N N    Patient Care Team: Shade Flood, MD as PCP - General (Family Medicine) [place], Peter M, MD as Consulting Physician (Cardiology) Marcine Matar, MD as Consulting Physician (Urology) Elwyn Reach (Neurology) Bettey Costa, MD as Consulting Physician (Dermatology)  Indicate any recent Medical Services you may have received from other than [facility] providers in the past year (date may be approximate).     Assessment:   This is a routine wellness examination for Brain.  Hearing/Vision screen Hearing Screening - Comments:: Bilateral Hearing Vision Screening - Comments:: Up to date [facility]    Goals Addressed               This Visit's Progress     Patient stated (pt-stated)   Not on track     I would like to get retired.      Patient Stated        Continue current lifestyle Finish getting retired downsize       Depression Screen    10/09/2023   11:38 AM 09/10/2023    9:52 AM 08/09/2023    8:08 AM 02/21/2023    8:22 AM 12/07/2022   10:31 AM 12/07/2022   10:30 AM 09/19/2022    1:36 PM  PHQ 2/9 Scores  PHQ - 2 Score 0 0 0 0 0 0 0  PHQ- 9 Score 2 0 0 0 0 0 0    Fall Risk    10/09/2023   11:32 AM 10/08/2023    3:00 PM 09/10/2023    9:52 AM 08/09/2023    8:07 AM 06/15/2023   11:32 AM  Fall Risk   Falls in the past year? 0 0 0 0 0  Number falls in past yr: 0  0 0 0  Injury with Fall? 0  0 0 0  Risk for fall due to :   No Fall Risks  Follow up Falls evaluation completed;Education provided;Falls prevention discussed  Falls evaluation completed Falls evaluation completed Falls evaluation completed    MEDICARE RISK AT HOME: Medicare Risk at Home Any stairs in or around the home?: Yes If so, are there any without handrails?: No Home free of loose throw rugs in walkways, pet beds, electrical  cords, etc?: Yes Adequate lighting in your home to reduce risk of falls?: Yes Life alert?: No Use of a cane, walker or w/c?: No Grab bars in the bathroom?: No Shower chair or bench in shower?: No Elevated toilet seat or a handicapped toilet?: No  TIMED UP AND GO:  Was the test performed?  No    Cognitive Function:    06/16/2023    4:00 PM  MMSE - Mini Mental State Exam  Orientation to time 4  Orientation to Place 5  Registration 3  Attention/ Calculation 5  Recall 3  Language- name 2 objects 2  Language- repeat 1  Language- follow 3 step command 3  Language- read & follow direction 1  Write a sentence 1  Copy design 1  Total score 29      09/18/2021    8:00 PM  Montreal Cognitive Assessment   Visuospatial/ Executive (0/5) 4  Naming (0/3) 3  Attention: Read list of digits (0/2) 2  Attention: Read list of letters (0/1) 1  Attention: Serial 7 subtraction starting at 100 (0/3) 3  Language: Repeat phrase (0/2) 2  Language : Fluency (0/1) 1  Abstraction (0/2) 2  Delayed Recall (0/5) 5  Orientation (0/6) 6  Total 29  Adjusted Score (based on education) 29      10/09/2023   11:35 AM 09/19/2022    1:41 PM 08/04/2021    8:52 AM 08/02/2020    9:08 AM 11/26/2017    9:03 AM  6CIT Screen  What Year? 0 points 0 points 0 points 0 points 0 points  What month? 0 points 0 points 0 points 0 points 0 points  What time? 0 points 0 points 3 points 3 points 0 points  Count back from 20 0 points 0 points 0 points 0 points 0 points  Months in reverse 2 points 0 points 0 points 0 points 0 points  Repeat phrase 2 points 0 points 0 points 0 points 0 points  Total Score 4 points 0 points 3 points 3 points 0 points    Immunizations Immunization History  Administered Date(s) Administered   Fluad Trivalent(High Dose 65+) 05/11/2023   Influenza Split 04/30/2012, 05/26/2016   Influenza,inj,Quad PF,6+ Mos 04/22/2013, 06/01/2014, 05/06/2015, 05/25/2017, 05/25/2018, 04/28/2019, 05/04/2022    Influenza-Unspecified 07/16/2020   PFIZER Comirnaty(Gray Top)Covid-19 Tri-Sucrose Vaccine 12/03/2020   PFIZER(Purple Top)SARS-COV-2 Vaccination 09/06/2019, 10/01/2019, 05/15/2020   Pfizer Covid-19 Vaccine Bivalent Booster 42yrs & up 05/18/2021, 06/05/2022   Pfizer(Comirnaty)Fall Seasonal Vaccine 12 years and older 06/05/2022, 05/01/2023   Pneumococcal Conjugate-13 08/02/2020   Pneumococcal Polysaccharide-23 08/04/2021   Respiratory Syncytial Virus Vaccine,Recomb Aduvanted(Arexvy) 06/19/2022   Tdap 04/21/2008   Zoster Recombinant(Shingrix) 06/27/2019, 11/22/2019   Zoster, Live 07/31/2014, 03/20/2015    TDAP status: Due, Education has been provided regarding the importance of this vaccine. Advised may receive this vaccine at local pharmacy or Health Dept. Aware to provide a copy of the vaccination record if obtained from local pharmacy or Health Dept. Verbalized acceptance and understanding.  Flu Vaccine status: Up to date  Pneumococcal vaccine status: Up to date  Covid-19 vaccine status: Information provided on how to obtain vaccines.   Qualifies for  Shingles Vaccine? No   Zostavax completed Yes   Shingrix Completed?: Yes  Screening Tests Health Maintenance  Topic Date Due   COVID-19 Vaccine (8 - 2024-25 season) 06/26/2023   Medicare Annual Wellness (AWV)  10/08/2024   Colonoscopy  07/12/2032   Pneumonia Vaccine 107+ Years old  Completed   INFLUENZA VACCINE  Completed   Hepatitis C Screening  Completed   Zoster Vaccines- Shingrix  Completed   HPV VACCINES  Aged Out   DTaP/Tdap/Td  Discontinued    Health Maintenance  Health Maintenance Due  Topic Date Due   COVID-19 Vaccine (8 - 2024-25 season) 06/26/2023    Colorectal cancer screening: Type of screening: Colonoscopy. Completed 2023. Repeat every 10 years  Lung Cancer Screening: (Low Dose CT Chest recommended if Age 11-80 years, 20 pack-year currently smoking OR have quit w/in 15years.) does not qualify.   Lung Cancer  Screening Referral:   Additional Screening:  Hepatitis C Screening: does not qualify; Completed 2013  Vision Screening: Recommended annual ophthalmology exams for early detection of glaucoma and other disorders of the eye. Is the patient up to date with their annual eye exam?  Yes  Who is the provider or what is the name of the office in which the patient attends annual eye exams? [facility]  If pt is not established with a provider, would they like to be referred to a provider to establish care? No .   Dental Screening: Recommended annual dental exams for proper oral hygiene    Community Resource Referral / Chronic Care Management: CRR required this visit?  No   CCM required this visit?  No     Plan:     I have personally reviewed and noted the following in the patient's chart:   Medical and social history Use of alcohol, tobacco or illicit drugs  Current medications and supplements including opioid prescriptions. Patient is not currently taking opioid prescriptions. Functional ability and status Nutritional status Physical activity Advanced directives List of other physicians Hospitalizations, surgeries, and ER visits in previous 12 months Vitals Screenings to include cognitive, depression, and falls Referrals and appointments  In addition, I have reviewed and discussed with patient certain preventive protocols, quality metrics, and best practice recommendations. A written personalized care plan for preventive services as well as general preventive health recommendations were provided to patient.     Remi Haggard, LPN   [id number]/[id number]   After Visit Summary: (MyChart) Due to this being a telephonic visit, the after visit summary with patients personalized plan was offered to patient via MyChart   Nurse Notes:

## 2023-10-29 ENCOUNTER — Encounter: Payer: Self-pay | Admitting: Psychology

## 2023-10-30 ENCOUNTER — Encounter: Payer: Self-pay | Admitting: Psychology

## 2023-10-30 ENCOUNTER — Ambulatory Visit: Payer: Self-pay

## 2023-10-30 ENCOUNTER — Ambulatory Visit: Payer: Medicare Other | Admitting: Psychology

## 2023-10-30 DIAGNOSIS — G3184 Mild cognitive impairment, so stated: Secondary | ICD-10-CM | POA: Diagnosis not present

## 2023-10-30 DIAGNOSIS — R4189 Other symptoms and signs involving cognitive functions and awareness: Secondary | ICD-10-CM

## 2023-10-30 DIAGNOSIS — I6381 Other cerebral infarction due to occlusion or stenosis of small artery: Secondary | ICD-10-CM

## 2023-10-30 DIAGNOSIS — I679 Cerebrovascular disease, unspecified: Secondary | ICD-10-CM

## 2023-10-30 NOTE — Progress Notes (Signed)
 NEUROPSYCHOLOGICAL EVALUATION [place] [place] Department of Neurology  Date of Evaluation: October 30, 2023  Reason for Referral:   Nicholas West. is a 68 y.o. right-handed Caucasian male referred by Marlowe Kays, PA-C, to characterize his current cognitive functioning and assist with diagnostic clarity and treatment planning in the context of a previously diagnosed mild neurocognitive disorder and concern for progressive cognitive decline.   Assessment and Plan:   Clinical Impression(s): Nicholas West pattern of performance is suggestive of an isolated impairment surrounding phonemic fluency. Further performance variability was exhibited across processing speed and both delayed retrieval and recognition/consolidation aspects of memory. Performances were appropriate relative to age-matched peers across attention/concentration, executive functioning, receptive language, semantic fluency, confrontation naming, writing samples, visuospatial abilities, and encoding (i.e., learning) aspects of memory. Nicholas West largely denied difficulties completing instrumental activities of daily living (ADLs) independently. His wife was in agreement. As such, given evidence for cognitive dysfunction described above, he continues to best meet diagnostic criteria for a Mild Neurocognitive Disorder ("mild cognitive impairment") at the present time.  Relative to his previous evaluation on October 2023, objective decline was exhibited across processing speed, as well as both list-based and daily living verbal memory tasks. During interview, Nicholas West noted an increased tendency towards a slow, intentional, and deliberate approach to task completion throughout his life to ensure greater accuracy. This could potentially account for some decline across a few tasks (TMT A&B, Coding) as lower performances reflect slower completion time (i.e., processing speed) rather than diminished accuracy. Across  memory testing, decline was isolated to 2 out of 4 tasks. More specifically, retention rates declined from 117% to 0% across a list learning task and 94% to 41% across a daily living task. Recognition scores across these tasks also saw noteworthy decline. However, performances were stable and normatively appropriate across story and shape learning tasks. Semantic fluency saw a mild improvement while other assessed cognitive domains exhibited stability.  The cause for objective decline remains uncertain at the present time. As highlighted previously, past neuroimaging has suggested moderate microvascular ischemic disease with some sign of progression over time. Broadly speaking, cerebrovascular disease can negatively impact phonemic fluency and delayed retrieval aspects of memory. This remains a plausible explanation. However, decline/dysfunction across memory storage does raise additional concern for an underlying neurological process as this would be a somewhat unexpected finding in a purely vascular presentation. Overall, I cannot rule out a concurrent neurological disease process in extremely early stages. However, it remains premature to rule in such a process with any degree of confidence. Continued medical monitoring will be important moving forward.   Recommendations: A repeat neuropsychological evaluation in 12-18 months is recommended to assess the trajectory of future cognitive decline should it occur. This will also aid in future efforts towards improved diagnostic clarity.  Nicholas West has already been prescribed a medication aimed to address memory loss and concerns surrounding a neurodegenerative illness (i.e., donepezil/Aricept). He is encouraged to continue taking this medication as prescribed. It is important to highlight that this medication has been shown to slow functional decline in some individuals. There is no current treatment which can stop or reverse cognitive decline when caused by a  neurodegenerative illness.   Should there be progression of current deficits over time, Nicholas West is unlikely to regain any independent living skills lost. Therefore, it is recommended that he remain as involved as possible in all aspects of household chores, finances, and medication management, with supervision to ensure  adequate performance. he will likely benefit from the establishment and maintenance of a routine in order to maximize his functional abilities over time.  It would likely be beneficial Nicholas West to have another person with him when in situations where he may need to process information, weigh the pros and cons of different options, and make decisions, in order to ensure that he fully understands and recalls all information to be considered.  If not already done, Nicholas West and his family may want to discuss his wishes regarding durable power of attorney and medical decision making, so that he can have input into these choices. If they require legal assistance with this, long-term care resource access, or other aspects of estate planning, they could reach out to [employer] at [phone] for a free consultation.   Nicholas West is encouraged to attend to lifestyle factors for brain health (e.g., regular physical exercise, good nutrition habits and consideration of the MIND-DASH diet, regular participation in cognitively-stimulating activities, and general stress management techniques), which are likely to have benefits for both emotional adjustment and cognition. Optimal control of vascular risk factors (including safe cardiovascular exercise and adherence to dietary recommendations) is encouraged. Continued participation in activities which provide mental stimulation and social interaction is also recommended.   Memory can be improved using internal strategies such as rehearsal, repetition, chunking, mnemonics, association, and imagery. External strategies such as written notes in  a consistently used memory journal, visual and nonverbal auditory cues such as a calendar on the refrigerator or appointments with alarm, such as on a cell phone, can also help maximize recall.    When learning new information, he would benefit from information being broken up into small, manageable pieces. He may also find it helpful to articulate the material in his own words and in a context to promote encoding at the onset of a new task. This material may need to be repeated multiple times to promote encoding.  Because he shows better recall for structured information, he will likely understand and retain new information better if it is presented to him in a meaningful or well-organized manner at the outset, such as grouping items into meaningful categories or presenting information in an outlined, bulleted, or story format.  To address problems with processing speed, he may wish to consider:   -Ensuring that he is alerted when essential material or instructions are being presented   -Adjusting the speed at which new information is presented   -Allowing for more time in comprehending, processing, and responding in conversation   -Repeating and paraphrasing instructions or conversations aloud  To address problems with fluctuating attention and/or executive dysfunction, he may wish to consider:   -Avoiding external distractions when needing to concentrate   -Limiting exposure to fast paced environments with multiple sensory demands   -Writing down complicated information and using checklists   -Attempting and completing one task at a time (i.e., no multi-tasking)   -Verbalizing aloud each step of a task to maintain focus   -Taking frequent breaks during the completion of steps/tasks to avoid fatigue   -Reducing the amount of information considered at one time   -Scheduling more difficult activities for a time of day where he is usually most alert  Review of Records:   Nicholas West was seen by  [facility] Marlowe Kays, PA-C) on 09/16/2021 for an evaluation of memory loss. Briefly, during a prior Medicare exam, his wife reported that Nicholas West. [place] memory has seemed worse during the prior  six months. Examples included him forgetting recently held conversations 15 minutes later and having increased navigational difficulties while driving. 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 Other ADLs were described as intact. He also has noticed right greater than left head bobbing and his wife reported occasional jerky movements of his left arm. Performance on a brief cognitive screening instrument (MOCA) was 29/30. Ultimately, Nicholas West. Marty was referred for a comprehensive neuropsychological evaluation to characterize his cognitive abilities and to assist with diagnostic clarity and treatment planning.   He completed a comprehensive neuropsychological evaluation with myself on 05/24/2022. Results suggested an isolated impairment surrounding verbal fluency (phonemic worse than semantic). Relative weaknesses were also exhibited across processing speed and, to a lesser extent, visuospatial abilities. Performances across other assessed cognitive domains were adequate. This includes attention/concentration, executive functioning, receptive language, confrontation naming, and learning and memory. ADL dysfunction was denied and he was ultimately diagnosed with a mild neurocognitive disorder. A primary vascular cause was said to be plausible at that time. Repeat testing was recommended.  He most recently saw Ms. Wertman on 06/15/2023 for follow-up. At that time, he and his wife expressed concern for some mildly progressive cognitive decline. No ADL dysfunction was noted. Performance on a brief cognitive screening instrument (MMSE) was 29/30. Ultimately, Nicholas West was referred for a repeat neuropsychological evaluation to characterize his cognitive  abilities and to assist with diagnostic clarity and treatment planning.   Neuroimaging: Brain MRI on 10/10/2021 revealed a likely subacute infarct involving the left occipital pole. It also suggested moderate microvascular ischemic changes. EEG on 09/23/2021 was normal. Neck MRA performed on 10/26/2021 did not suggest appreciable stenosis. Brain MRA on the same date did suggest moderate stenosis within the A2 segment of the right anterior cerebral artery.  Past Medical History:  Diagnosis Date   Abdominal pain    BCC (basal cell carcinoma) 02/05/2013   Recurrent, top right scalp, MOHs   Cerebrovascular disease    Chest pain    negative stress echo in September 2013   Contusion of right elbow 10/11/2016   Diverticulitis    Dysplastic nevus    moderate, Left back no treatment   Erectile dysfunction 03/05/2012   GERD (gastroesophageal reflux disease)    Hiatal hernia 08/02/2020   Hypercholesterolemia 04/03/2012   Laceration of hand without foreign body 10/11/2016   Lacunar infarction 10/10/2021   MRI - subtle area of subacute gyriform enhancement in the left occipital pole   Male hypogonadism 03/05/2012   Mild cognitive impairment of uncertain or unknown etiology 05/24/2022   Paroxysmal atrial fibrillation 10/24/2012    Past Surgical History:  Procedure Laterality Date   COLON SURGERY     PARTIAL COLECTOMY  11/11    Current Outpatient Medications:    azelastine (ASTELIN) 0.1 % nasal spray, Place 1-2 sprays into both nostrils 2 (two) times daily as needed for rhinitis. Use in each nostril as directed, Disp: 30 mL, Rfl: 3   B-D 3CC LUER-LOK SYR 23GX1" 23G X 1" 3 ML MISC, SMARTSIG:1 Injection Once a Week, Disp: , Rfl:    B-D HYPODERMIC NEEDLE 18GX1.5" 18G X 1-1/2" MISC, USE TO DRAW UP MEDICATION, Disp: , Rfl:    calcium carbonate (OS-CAL - DOSED IN MG OF ELEMENTAL CALCIUM) 1250 MG tablet, Take 2 tablets by mouth daily with breakfast., Disp: , Rfl:    cholecalciferol (VITAMIN D) 1000  UNITS tablet, Take 1,000 Units by mouth daily., Disp: , Rfl:  cyanocobalamin (VITAMIN B12) 500 MCG tablet, Take 500 mcg by mouth daily., Disp: , Rfl:    diltiazem (CARDIZEM CD) 240 MG 24 hr capsule, TAKE 1 CAPSULE(240 MG) BY MOUTH DAILY, Disp: 90 capsule, Rfl: 3   Docusate Sodium (COLACE PO), Take 1 tablet by mouth daily as needed (constipation)., Disp: , Rfl:    donepezil (ARICEPT) 5 MG tablet, Take 1 tablet (5 mg total) by mouth daily., Disp: , Rfl:    ELIQUIS 5 MG TABS tablet, TAKE 1 TABLET(5 MG) BY MOUTH TWICE DAILY, Disp: 180 tablet, Rfl: 1   metroNIDAZOLE (METROCREAM) 0.75 % cream, Apply 1 Application topically 2 (two) times daily., Disp: , Rfl:    Multiple Vitamin (MULTIVITAMIN) tablet, Take 1 tablet by mouth daily., Disp: , Rfl:    Omega-3 Fatty Acids (OMEGA 3 PO), Take 2 capsules by mouth daily. , Disp: , Rfl:    omeprazole (PRILOSEC) 20 MG capsule, Take 1 capsule (20 mg total) by mouth daily., Disp: 30 capsule, Rfl: 11   Probiotic Product (PROBIOTIC-10 PO), Take 1 tablet by mouth daily., Disp: , Rfl:    rosuvastatin (CRESTOR) 10 MG tablet, TAKE 1 TABLET(10 MG) BY MOUTH DAILY, Disp: 90 tablet, Rfl: 1   sildenafil (VIAGRA) 100 MG tablet, Take 100 mg by mouth as needed., Disp: , Rfl:    testosterone cypionate (DEPOTESTOSTERONE CYPIONATE) 200 MG/ML injection, Inject into the muscle once a week. , Disp: , Rfl:   Clinical Interview:   The following information was obtained during a clinical interview with Nicholas West. Ricke and his wife prior to cognitive testing.  Cognitive Symptoms: Decreased short-term memory: Endorsed. He previously described greater difficulties remembering tasks he needs to complete, especially if his typical routine gets disrupted. His wife previously noted greater difficulties recalling past conversations, repeating himself at times, and forgetting where familiar household objects are located. Currently, they both described similar examples, noting their perception that  difficulties had mildly worsened since his previous October 2023 evaluation.  Decreased long-term memory: Denied. Decreased attention/concentration: Denied. Reduced processing speed: Denied. Difficulties with executive functions: Denied. They also denied trouble with impulsivity or any significant personality changes.  Difficulties with emotion regulation: Denied. Difficulties with receptive language: Denied. Difficulties with word finding: Endorsed "occasionally." Decreased visuoperceptual ability: Denied.   Difficulties completing ADLs: Denied. He did report needing to be more intentional with marking his calendar and keeping track of medications. He also reported greater difficulty performing tasks when his routine is disrupted. However, in general, he denied ADL dysfunction currently. His wife was in agreement.  Additional Medical History: History of traumatic brain injury/concussion: Denied. He previously reported a fall around 2021-2022 where he hit his head after mis-stepping on a flight of stairs while carrying several boxes. He did not report a loss in consciousness and reported not being formally diagnosed with a concussion. No other potential head injuries were reported. History of stroke: See neuroimaging above. History of seizure activity: Denied. History of known exposure to toxins: Denied. Symptoms of chronic pain: Denied. Experience of frequent headaches/migraines: Denied. Frequent instances of dizziness/vertigo: Denied.   Sensory changes: He uses contacts/reading glasses with benefit. He also utilizes hearing aids with benefit. Other sensory changes/difficulties (i.e., taste and smell) were denied.  Balance/coordination difficulties: Endorsed. His wife previously noted that balance seems to have improved over time rather than progressively worsened. Nicholas West did state that he is far more careful since the aforementioned fall and always holds onto hand rails for added safety.  No other falls were reported and  one side of the body was not said to be worse than the other. This was stable. Other motor difficulties: Endorsed. Symptoms were said to be intermittent and action-based (e.g., writing, holding something in his hand). No resting or postural tremors were reported.   Sleep History: Estimated hours obtained each night: 7-8 hours.  Difficulties falling asleep: Denied. Difficulties staying asleep: Denied. Feels rested and refreshed upon awakening: Endorsed.   History of snoring: Endorsed. Per his wife, snoring is "very little." History of waking up gasping for air: Denied. Witnessed breath cessation while asleep: Denied.   History of vivid dreaming: Denied. Excessive movement while asleep: Denied. There are previous medical records which suggest possible REM sleep behaviors and some acting out dream content. Nicholas West and his wife denied these experiences during both the previous and current interview.  Instances of acting out his dreams: Denied.  Psychiatric/Behavioral Health History: Depression: Denied. He described his current mood as "pretty good" and denied any previous mental health concerns or diagnoses. Current or remote suicidal ideation, intent, or plan was denied.  Anxiety: Denied. Mania: Denied. Trauma History: Denied. Visual/auditory hallucinations: Denied. Delusional thoughts: Denied.   Tobacco: Denied. Alcohol: He denied current alcohol consumption as well as a history of problematic alcohol abuse or dependence.  Recreational drugs: Denied.  Family History: Problem Relation Age of Onset   Cancer Mother    Alzheimer's disease Father        onset in mid-60s   Colon polyps Father    GER disease Father    Dementia Sister        unspecified type; rapidly progressing   Heart disease Paternal Grandmother    Cancer Paternal Grandfather    This information was confirmed by Nicholas West.  Academic/Vocational History: Highest level of  educational attainment: 19 years. 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 He described himself as a Designer, multimedia in academic settings. No relative weaknesses were identified.  History of developmental delay: Denied. History of grade repetition: Denied. Enrollment in special education courses: Denied. History of LD/ADHD: Denied.   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 He did not report any diminished work performance caused by cognitive decline. He also did not describe any colleagues expressing concern.   Evaluation Results:   Behavioral Observations: Nicholas West was accompanied by his wife, arrived to his appointment on time, and was appropriately dressed and groomed. He appeared alert. Observed gait and station were within normal limits. Gross motor functioning appeared intact upon informal observation and no abnormal movements (e.g., tremors) were noted. His affect was generally relaxed and positive. Spontaneous speech was fluent and word finding difficulties were not observed during the clinical interview. Thought processes were coherent, organized, and normal in content. Insight into his cognitive difficulties appeared adequate.   During testing, sustained attention was appropriate. Task engagement was adequate and he persisted when challenged. Overall, Nicholas West was cooperative with the clinical interview and subsequent testing procedures.   Adequacy of Effort: The validity of neuropsychological testing is limited by the extent to which the individual being tested may be assumed to have exerted adequate effort during testing. Nicholas West expressed his intention to perform to the best of his abilities and exhibited adequate task engagement and persistence. Scores across stand-alone and embedded performance validity measures were within expectation. As such, the results of  the current evaluation are believed to be a valid representation of  Nicholas West current cognitive functioning.  Test Results: Nicholas West. [place] was fully oriented at the time of the current evaluation.  Intellectual abilities based upon educational and vocational attainment were estimated to be in the above average range. Premorbid abilities were estimated to be within the above average range based upon a single-word reading test.   Processing speed was variable, ranging from the exceptionally low to average normative ranges. Basic attention was average. More complex attention (e.g., working memory) was above average. Executive functioning was below average to average.  Assessed receptive language abilities were average. Nicholas West did not exhibit any difficulties comprehending task instructions and answered all questions asked of him appropriately. Assessed expressive language was average outside of an isolated impairment (exceptionally low normative range) across phonemic fluency.     Assessed visuospatial/visuoconstructional abilities were below average to average.    Learning (i.e., encoding) of novel verbal and visual information was variable but overall adequate, ranging from the below average to above average normative ranges. Spontaneous delayed recall (i.e., retrieval) of previously learned information was variable, ranging from the exceptionally low to average normative ranges. Retention rates were 0% across a list learning task, 91% across a story learning task, 41% across a daily living task, and 100% across a shape learning task. Performance across recognition tasks was variable, ranging from the exceptionally low to average normative ranges, suggesting some evidence for information consolidation.   Results of emotional screening instruments suggested that recent symptoms of generalized anxiety were in the minimal range, while symptoms of depression were within normal limits. A screening  instrument assessing recent sleep quality suggested the presence of minimal sleep dysfunction.  Tables of Scores:   Note: This summary of test scores accompanies the interpretive report and should not be considered in isolation without reference to the appropriate sections in the text. Descriptors are based on appropriate normative data and may be adjusted based on clinical judgment. Terms such as "Within Normal Limits" and "Outside Normal Limits" are used when a more specific description of the test score cannot be determined. Descriptors refer to the current evaluation only.        Percentile - Normative Descriptor > 98 - Exceptionally High 91-97 - Well Above Average 75-90 - Above Average 25-74 - Average 9-24 - Below Average 2-8 - Well Below Average < 2 - Exceptionally Low        Validity: October 2023 Current  DESCRIPTOR        ACS WC: --- --- --- Within Normal Limits  DCT: --- --- --- Within Normal Limits        Orientation:       Raw Score Raw Score Percentile   NAB Orientation, Form 1 29/29 29/29 --- ---        Cognitive Screening:       Raw Score Raw Score Percentile   SLUMS: 25/30 20/30 --- ---        Intellectual Functioning:       Standard Score Standard Score Percentile   Test of Premorbid Functioning: 110 118 88 Above Average        Memory:      NAB Memory Module, Form 1: Standard Score/ T Score Standard Score/ T Score Percentile   Total Memory Index 97 80 9 Below Average  List Learning        Total Trials 1-3 21/36 (45) 19/36 (42) 21 Below Average    List B 5/12 (54) 5/12 (54) 66 Average    Short Delay Free Recall  6/12 (43) 5/12 (38) 12 Below Average    Long Delay Free Recall 7/12 (49) 0/12 (21) <1 Exceptionally Low    Retention Percentage 117 (57) 0 (<19) <1 Exceptionally Low    Recognition Discriminability 3 (37) -5 (<19) <1 Exceptionally Low  Shape Learning        Total Trials 1-3 12/27 (39) 14/27 (45) 31 Average    Delayed Recall 7/9 (59) 6/9 (51) 54  Average    Retention Percentage 140 (63) 100 (51) 54 Average    Recognition Discriminability 5 (42) 6 (47) 38 Average  Story Learning        Immediate Recall 55/80 (43) 59/80 (47) 38 Average    Delayed Recall 29/40 (43) 29/40 (43) 25 Average    Retention Percentage 85 (48) 91 (51) 54 Average  Daily Living Memory        Immediate Recall 47/51 (60) 46/51 (57) 75 Above Average    Delayed Recall 16/17 (61) 7/17 (23) <1 Exceptionally Low    Retention Percentage 94 (55) 41 (<19) <1 Exceptionally Low    Recognition Hits 10/10 (60) 6/10 (28) 2 Well Below Average        Attention/Executive Function:      Trail Making Test (TMT): Raw Score (T Score) Raw Score (T Score) Percentile     Part A 52 secs.,  0 errors (35) 67 secs.,  0 errors (27) 1 Exceptionally Low    Part B 71 secs.,  0 errors (50) 106 secs.,  0 errors (37) 9 Below Average          Scaled Score Scaled Score Percentile   WAIS-IV Coding: 7 4 2  Well Below Average        NAB Attention Module, Form 1: T Score T Score Percentile     Digits Forward 53 53 62 Average    Digits Backwards 58 58 79 Above Average        D-KEFS Color-Word Interference Test: Raw Score (Scaled Score) Raw Score (Scaled Score) Percentile     Color Naming 38 secs. (8) 39 secs. (7) 16 Below Average    Word Reading 30 secs. (7) 27 secs. (9) 37 Average    Inhibition 55 secs. (12) 65 secs. (11) 63 Average      Total Errors 0 errors (12) 0 errors (12) 75 Above Average    Inhibition/Switching 61 secs. (12) 82 secs. (9) 37 Average      Total Errors 1 error (12) 3 errors (10) 50 Average        D-KEFS Verbal Fluency Test: Raw Score (Scaled Score) Raw Score (Scaled Score) Percentile     Letter Total Correct 10 (2) 12 (3) 1 Exceptionally Low    Category Total Correct 27 (7) 32 (9) 37 Average    Category Switching Total Correct 10 (7) 12 (9) 37 Average    Category Switching Accuracy 8 (7) 11 (10) 50 Average      Total Set Loss Errors 2 (10) 1 (11) 63 Average       Total Repetition Errors 0 (13) 0 (13) 84 Above Average        Language:      Verbal Fluency Test: Raw Score (T Score) Raw Score (T Score) Percentile     Phonemic Fluency (FAS) 10 (<19) 12 (<19) <1 Exceptionally Low    Animal Fluency 11 (26) 18 (43) 25 Average         NAB Language Module, Form 1: T Score T Score Percentile  Oral Production --- 44 27 Average    Auditory Comprehension --- 56 73 Average    Reading Comprehension --- Raw = 13/13 --- Within Normal Limits    Naming 31/31 (55) 30/31 (54) 66 Average    Writing --- 53 62 Average        Visuospatial/Visuoconstruction:       Raw Score Raw Score Percentile   Clock Drawing: 9/10 9/10 --- Within Normal Limits        NAB Spatial Module, Form 1: T Score T Score Percentile     Figure Drawing Copy 53 53 62 Average         Scaled Score Scaled Score Percentile   WAIS-IV Matrix Reasoning: 7 8 25  Average  WAIS-IV Visual Puzzles: 8 7 16  Below Average        Mood and Personality:       Raw Score Raw Score Percentile   Beck Depression Inventory - II: 1 2 --- Within Normal Limits  PROMIS Anxiety Questionnaire: 8 10 --- None to Slight        Additional Questionnaires:       Raw Score Raw Score Percentile   PROMIS Sleep Disturbance Questionnaire: 8 9 --- None to Slight   Informed Consent and Coding/Compliance:   The current evaluation represents a clinical evaluation for the purposes previously outlined by the referral source and is in no way reflective of a forensic evaluation.   Nicholas West. Halsted was provided with a verbal description of the nature and purpose of the present neuropsychological evaluation. Also reviewed were the foreseeable risks and/or discomforts and benefits of the procedure, limits of confidentiality, and mandatory reporting requirements of this provider. The patient was given the opportunity to ask questions and receive answers about the evaluation. Oral consent to participate was provided by the patient.   This  evaluation was conducted by Newman Nickels, Ph.D., ABPP-CN, board certified clinical neuropsychologist. Nicholas West. [place] completed a clinical interview with Dr. Milbert Coulter, billed as one unit [phone], and 130 minutes of cognitive testing and scoring, billed as one unit [phone] and three additional units [id number]. Psychometrist Shan Levans, B.S. assisted Dr. Milbert Coulter with test administration and scoring procedures. As a separate and discrete service, one unit [id number] and two units [phone] were billed for Dr. Tammy Sours time spent in interpretation and report writing.

## 2023-10-30 NOTE — Progress Notes (Signed)
   Psychometrician Note   Cognitive testing was administered to Nicholas West. by Shan Levans, B.S. (psychometrist) under the supervision of Dr. Newman Nickels, Ph.D., ABPP, licensed psychologist on 10/30/2023. [name] did not appear overtly distressed by the testing session per behavioral observation or responses across self-report questionnaires. Rest breaks were offered.    The battery of tests administered was selected by Dr. Newman Nickels, Ph.D., ABPP with consideration to [name] current level of functioning, the nature of his symptoms, emotional and behavioral responses during interview, level of literacy, observed level of motivation/effort, and the nature of the referral question. This battery was communicated to the psychometrist. Communication between Dr. Newman Nickels, Ph.D., ABPP and the psychometrist was ongoing throughout the evaluation and Dr. Newman Nickels, Ph.D., ABPP was immediately accessible at all times. Dr. Newman Nickels, Ph.D., ABPP provided supervision to the psychometrist on the date of this service to the extent necessary to assure the quality of all services provided.    Nicholas West. will return within approximately 1-2 weeks for an interactive feedback session with Dr. Milbert Coulter at which time his test performances, clinical impressions, and treatment recommendations will be reviewed in detail. [name] understands he can contact our office should he require our assistance before this time.  A total of 130 minutes of billable time were spent face-to-face with [name] by the psychometrist. This includes both test administration and scoring time. Billing for these services is reflected in the clinical report generated by Dr. Newman Nickels, Ph.D., ABPP  This note reflects time spent with the psychometrician and does not include test scores or any clinical interpretations made by Dr. Milbert Coulter. The full report will follow in a separate note.

## 2023-11-06 ENCOUNTER — Ambulatory Visit: Payer: Medicare Other | Admitting: Psychology

## 2023-11-06 DIAGNOSIS — G3184 Mild cognitive impairment, so stated: Secondary | ICD-10-CM | POA: Diagnosis not present

## 2023-11-06 DIAGNOSIS — I6381 Other cerebral infarction due to occlusion or stenosis of small artery: Secondary | ICD-10-CM

## 2023-11-06 DIAGNOSIS — I679 Cerebrovascular disease, unspecified: Secondary | ICD-10-CM

## 2023-11-06 NOTE — Progress Notes (Signed)
   Neuropsychology Feedback Session Eligha Bridegroom. [facility] Department of Neurology  Reason for Referral:   Brynn Reznik. is a 68 y.o. right-handed Caucasian male referred by Marlowe Kays, PA-C, to characterize his current cognitive functioning and assist with diagnostic clarity and treatment planning in the context of a previously diagnosed mild neurocognitive disorder and concern for progressive cognitive decline.   Feedback:   Mr. Pollio completed a comprehensive neuropsychological evaluation on 10/30/2023. Please refer to that encounter for the full report and recommendations. Briefly, results suggested an isolated impairment surrounding phonemic fluency. Further performance variability was exhibited across processing speed and both delayed retrieval and recognition/consolidation aspects of memory. Relative to his previous evaluation on October 2023, objective decline was exhibited across processing speed, as well as both list-based and daily living verbal memory tasks. During interview, Mr. Quam noted an increased tendency towards a slow, intentional, and deliberate approach to task completion throughout his life to ensure greater accuracy. This could potentially account for some decline across a few tasks (TMT A&B, Coding) as lower performances reflect slower completion time (i.e., processing speed) rather than diminished accuracy. Across memory testing, decline was isolated to 2 out of 4 tasks. More specifically, retention rates declined from 117% to 0% across a list learning task and 94% to 41% across a daily living task. Recognition scores across these tasks also saw noteworthy decline. However, performances were stable and normatively appropriate across story and shape learning tasks. Semantic fluency saw a mild improvement while other assessed cognitive domains exhibited stability. The cause for objective decline remains uncertain at the present time. As highlighted  previously, past neuroimaging has suggested moderate microvascular ischemic disease with some sign of progression over time. Broadly speaking, cerebrovascular disease can negatively impact phonemic fluency and delayed retrieval aspects of memory. This remains a plausible explanation. However, decline/dysfunction across memory storage does raise additional concern for an underlying neurological process as this would be a somewhat unexpected finding in a purely vascular presentation. Overall, I cannot rule out a concurrent neurological disease process in extremely early stages. However, it remains premature to rule in such a process with any degree of confidence.   Mr. Fairbank was accompanied by his wife during the current feedback session. Content of the current session focused on the results of his neuropsychological evaluation. Mr. Tse was given the opportunity to ask questions and his questions were answered. He was encouraged to reach out should additional questions arise. A copy of his report was provided at the conclusion of the visit.      One unit [phone] was billed for Dr. Tammy Sours time spent preparing for, conducting, and documenting the current feedback session with Mr. Mccready.

## 2023-11-09 ENCOUNTER — Encounter: Payer: Self-pay | Admitting: Physician Assistant

## 2023-11-09 ENCOUNTER — Telehealth: Payer: Self-pay | Admitting: Cardiology

## 2023-11-09 ENCOUNTER — Other Ambulatory Visit: Payer: Self-pay

## 2023-11-09 DIAGNOSIS — I480 Paroxysmal atrial fibrillation: Secondary | ICD-10-CM

## 2023-11-09 MED ORDER — APIXABAN 5 MG PO TABS: 5.0000 mg | ORAL_TABLET | Freq: Two times a day (BID) | ORAL | 1 refills | Status: DC

## 2023-11-09 NOTE — Telephone Encounter (Signed)
 Prescription refill request for Eliquis received. Indication:afib Last office visit:11/24 Scr:0.90  1/25 Age: 68 Weight:75.6  kg  Prescription refilled

## 2023-11-09 NOTE — Telephone Encounter (Signed)
*  STAT* If patient is at the pharmacy, call can be transferred to refill team.   1. Which medications need to be refilled? (please list name of each medication and dose if known)  ELIQUIS 5 MG TABS tablet  2. Which pharmacy/location (including street and city if local pharmacy) is medication to be sent to? [facility] [place], [place] - [address]  3. Do they need a 30 day or 90 day supply?   90 day supply

## 2023-11-09 NOTE — Telephone Encounter (Signed)
 There is no indication for repeat MRI, will not add to any further diagnosis. Dr. Tammy Sours note is very comprehensive. Will go by that. Thanks

## 2023-11-22 DIAGNOSIS — D485 Neoplasm of uncertain behavior of skin: Secondary | ICD-10-CM | POA: Diagnosis not present

## 2023-11-22 DIAGNOSIS — L988 Other specified disorders of the skin and subcutaneous tissue: Secondary | ICD-10-CM | POA: Diagnosis not present

## 2023-11-22 DIAGNOSIS — L570 Actinic keratosis: Secondary | ICD-10-CM | POA: Diagnosis not present

## 2023-11-22 DIAGNOSIS — L821 Other seborrheic keratosis: Secondary | ICD-10-CM | POA: Diagnosis not present

## 2023-11-22 DIAGNOSIS — D224 Melanocytic nevi of scalp and neck: Secondary | ICD-10-CM | POA: Diagnosis not present

## 2023-11-22 DIAGNOSIS — D225 Melanocytic nevi of trunk: Secondary | ICD-10-CM | POA: Diagnosis not present

## 2023-11-22 DIAGNOSIS — Z85828 Personal history of other malignant neoplasm of skin: Secondary | ICD-10-CM | POA: Diagnosis not present

## 2023-11-26 ENCOUNTER — Ambulatory Visit (INDEPENDENT_AMBULATORY_CARE_PROVIDER_SITE_OTHER): Payer: Medicare Other

## 2023-11-26 ENCOUNTER — Encounter (INDEPENDENT_AMBULATORY_CARE_PROVIDER_SITE_OTHER): Payer: Self-pay

## 2023-11-26 VITALS — BP 118/68 | Ht 73.0 in | Wt 166.0 lb

## 2023-11-26 DIAGNOSIS — H903 Sensorineural hearing loss, bilateral: Secondary | ICD-10-CM | POA: Diagnosis not present

## 2023-11-26 DIAGNOSIS — H6123 Impacted cerumen, bilateral: Secondary | ICD-10-CM

## 2023-11-27 DIAGNOSIS — H903 Sensorineural hearing loss, bilateral: Secondary | ICD-10-CM | POA: Insufficient documentation

## 2023-11-27 DIAGNOSIS — H6123 Impacted cerumen, bilateral: Secondary | ICD-10-CM | POA: Insufficient documentation

## 2023-11-27 NOTE — Progress Notes (Signed)
 Patient ID: Nicholas Cowden., male   DOB: [birth year], 68 y.o.   MRN: [id number]  Follow-up: Hearing loss  HPI: The patient is a 68 year old male who returns today for his follow-up evaluation.  He has a history of bilateral high-frequency sensorineural hearing loss.  He was fitted with a new set of hearing aids recently at [facility].  He also has a history of recurrent cerumen impaction, requiring periodic disimpaction.  The patient returns today reporting no significant change in his hearing.  He denies any otorrhea or vertigo.  Exam: General: Communicates without difficulty, well nourished, no acute distress. Head: Normocephalic, no evidence injury, no tenderness, facial buttresses intact without stepoff. Face/sinus: No tenderness to palpation and percussion. Facial movement is normal and symmetric. Eyes: PERRL, EOMI. No scleral icterus, conjunctivae clear. Neuro: CN II exam reveals vision grossly intact. No nystagmus at any point of gaze. Auricles: Intact without lesions. EAC: Bilateral cerumen impaction.  Nose: External evaluation reveals normal support and skin without lesions. Dorsum is intact. Anterior rhinoscopy reveals healthy pink mucosa over anterior aspect of inferior turbinates and intact septum. No purulence noted. Oral:  Oral cavity and oropharynx are intact, symmetric, without erythema or edema. Mucosa is moist without lesions. Neck: Full range of motion without pain. There is no significant lymphadenopathy. No masses palpable. Thyroid  bed within normal limits to palpation. Parotid glands and submandibular glands equal bilaterally without mass. Trachea is midline. Neuro:  CN 2-12 grossly intact. Gait normal. Vestibular: No nystagmus at any point of gaze.   Procedure: Bilateral cerumen disimpaction Anesthesia: None Description: Under the operating microscope, the cerumen is carefully removed with a combination of cerumen currette, alligator forceps, and suction catheters.  After the cerumen is  removed, the TMs are noted to be normal.  No mass, erythema, or lesions. The patient tolerated the procedure well.   Assessment:  1.  Bilateral cerumen impaction.  After the disimpaction procedure, his tympanic membrane's and middle ear spaces are noted to be normal. 2.  Subjectively stable bilateral high-frequency sensorineural hearing loss.  Plan:  1.  Otomicroscopy with bilateral cerumen disimpaction. 2.  The physical exam findings are reviewed with the patient. 3.  Continue the use of his new hearing aids. 4.  The patient will return for reevaluation in 1 year.

## 2023-12-13 NOTE — Progress Notes (Signed)
 Nicholas B Mullings Jr. Date of Birth: 02-26-1956 Medical Record #[id number]  History of Present Illness: Nicholas West is seen for follow up of atrial fibrillation.   He has a history of paroxysmal atrial fibrillation. He has been managed with rate control with diltiazem . He previously had a [place] vascular score of 0 and was  taking a baby aspirin daily.   In 2019 he complained of nausea and tingling in his arms with exercise. We arranged a POET. On arrival he was in AFib with rate 98. He exercised for 9 minutes on the Bruce protocol without symptoms or ST changes. He converted to NSR then went back into atrial flutter with RVR. His Cardizem  was increased to 240 mg daily.    In 2023 he was experiencing some issues with his balance and memory loss. He had a couple of falls. Was evaluated by Dr Alane Allen. EEG and extensive lab work OK. Cranial MRI showed evidence of subacute infarct in the  left occipital pole. MRA showed modest stenosis in the A2 segment of the right anterior cerebral artery.   On follow up today he is doing very well. No chest pain or SOB. He has noted some infrequent episodes of irregular heart beat- typically at night. Walks on treadmill 6 days a week for an hour. Is eating healthy  Current Outpatient Medications on File Prior to Visit  Medication Sig Dispense Refill   apixaban  (ELIQUIS ) 5 MG TABS tablet Take 1 tablet (5 mg total) by mouth 2 (two) times daily. 180 tablet 1   azelastine  (ASTELIN ) 0.1 % nasal spray Place 1-2 sprays into both nostrils 2 (two) times daily as needed for rhinitis. Use in each nostril as directed 30 mL 3   B-D 3CC LUER-LOK SYR 23GX1" 23G X 1" 3 ML MISC SMARTSIG:1 Injection Once a Week     B-D HYPODERMIC NEEDLE 18GX1.5" 18G X 1-1/2" MISC USE TO DRAW UP MEDICATION     calcium  carbonate (OS-CAL - DOSED IN MG OF ELEMENTAL CALCIUM ) 1250 MG tablet Take 2 tablets by mouth daily with breakfast.     cholecalciferol (VITAMIN D ) 1000 UNITS tablet Take 1,000 Units by mouth  daily.     cyanocobalamin  (VITAMIN B12) 500 MCG tablet Take 500 mcg by mouth daily.     diltiazem  (CARDIZEM  CD) 240 MG 24 hr capsule TAKE 1 CAPSULE(240 MG) BY MOUTH DAILY 90 capsule 3   Docusate Sodium (COLACE PO) Take 1 tablet by mouth daily as needed (constipation).     donepezil  (ARICEPT ) 5 MG tablet Take 1 tablet (5 mg total) by mouth daily.     metroNIDAZOLE  (METROCREAM ) 0.75 % cream Apply 1 Application topically 2 (two) times daily.     Multiple Vitamin (MULTIVITAMIN) tablet Take 1 tablet by mouth daily.     Omega-3 Fatty Acids (OMEGA 3 PO) Take 2 capsules by mouth daily.      omeprazole  (PRILOSEC) 20 MG capsule Take 1 capsule (20 mg total) by mouth daily. 30 capsule 11   Probiotic Product (PROBIOTIC-10 PO) Take 1 tablet by mouth daily.     rosuvastatin  (CRESTOR ) 10 MG tablet TAKE 1 TABLET(10 MG) BY MOUTH DAILY 90 tablet 1   sildenafil  (VIAGRA ) 100 MG tablet Take 100 mg by mouth as needed.     testosterone  cypionate (DEPOTESTOSTERONE CYPIONATE) 200 MG/ML injection Inject into the muscle once a week.      No current facility-administered medications on file prior to visit.    Allergies  Allergen Reactions   Doxycycline Rash  Penicillins     rash    Past Medical History:  Diagnosis Date   Abdominal pain    BCC (basal cell carcinoma) 02/05/2013   Recurrent, top right scalp, MOHs   Cerebrovascular disease    Chest pain    negative stress echo in September 2013   Contusion of right elbow 10/11/2016   Diverticulitis    Dysplastic nevus    moderate, Left back no treatment   Erectile dysfunction 03/05/2012   GERD (gastroesophageal reflux disease)    Hiatal hernia 08/02/2020   Hypercholesterolemia 04/03/2012   Laceration of hand without foreign body 10/11/2016   Lacunar infarction 10/10/2021   MRI - subtle area of subacute gyriform enhancement in the left occipital pole   Male hypogonadism 03/05/2012   Mild cognitive impairment of uncertain or unknown etiology 05/24/2022    Paroxysmal atrial fibrillation 10/24/2012    Past Surgical History:  Procedure Laterality Date   COLON SURGERY     PARTIAL COLECTOMY  11/11    Social History   Tobacco Use  Smoking Status Never  Smokeless Tobacco Never    Social History   Substance and Sexual Activity  Alcohol Use No    Family History  Problem Relation Age of Onset   Cancer Mother    Alzheimer's disease Father        onset in mid-60s   Colon polyps Father    GER disease Father    Dementia Sister        unspecified type; rapidly progressing   Heart disease Paternal Grandmother    Cancer Paternal Grandfather     Review of Systems: The review of systems is per the HPI.  All other systems were reviewed and are negative.  Physical Exam: BP 118/82 (BP Location: Left Arm, Patient Position: Sitting, Cuff Size: Normal)   Pulse 73   Ht 6\' 1"  (1.854 m)   Wt 168 lb 6.4 oz (76.4 kg)   SpO2 96%   BMI 22.22 kg/m  GENERAL:  Well appearing WM in NAD HEENT:  PERRL, EOMI, sclera are clear. Oropharynx is clear. NECK:  No jugular venous distention, carotid upstroke brisk and symmetric, no bruits, no thyromegaly or adenopathy LUNGS:  Clear to auscultation bilaterally CHEST:  Unremarkable HEART:  RRR,  PMI not displaced or sustained,S1 and S2 within normal limits, no S3, no S4: no clicks, no rubs, no murmurs ABD:  Soft, nontender. BS +, no masses or bruits. No hepatomegaly, no splenomegaly EXT:  2 + pulses throughout, no edema, no cyanosis no clubbing SKIN:  Warm and dry.  No rashes NEURO:  Alert and oriented x 3. Cranial nerves II through XII intact. PSYCH:  Cognitively intact  LABORATORY DATA:   Lab Results  Component Value Date   WBC 5.7 08/16/2023   HGB 16.8 08/16/2023   HCT 48.7 08/16/2023   PLT 158.0 08/16/2023   GLUCOSE 76 08/09/2023   CHOL 124 08/09/2023   TRIG 65.0 08/09/2023   HDL 48.60 08/09/2023   LDLCALC 62 08/09/2023   ALT 37 08/09/2023   AST 39 (H) 08/09/2023   NA 141 08/09/2023   K  4.0 08/09/2023   CL 102 08/09/2023   CREATININE 0.90 08/09/2023   BUN 16 08/09/2023   CO2 31 08/09/2023   TSH 2.05 09/16/2021   PSA 1.38 10/29/2015   INR 1.10 06/04/2013   HGBA1C 5.4 08/04/2021     ETT 08/31/17: Study Highlights   Blood pressure demonstrated a blunted response to exercise. Patient started the test in  atrial fibrillation and converted to sinus rhythm and subsequently atrial flutter with rapid ventricular response with exercise. There was no ST segment deviation noted during stress. Negative, adequate stress test.   Echo 10/25/21: IMPRESSIONS     1. Left ventricular ejection fraction, by estimation, is 60%. Left  ventricular ejection fraction by 3D volume is 58 %. The left ventricle has  normal function. The left ventricle has no regional wall motion  abnormalities. Left ventricular diastolic  parameters are consistent with Grade I diastolic dysfunction (impaired  relaxation). The average left ventricular global longitudinal strain is  -20.0 %. The global longitudinal strain is normal.   2. Right ventricular systolic function is normal. The right ventricular  size is normal. Tricuspid regurgitation signal is inadequate for assessing  PA pressure.   3. The mitral valve is normal in structure. Trivial mitral valve  regurgitation. No evidence of mitral stenosis.   4. The aortic valve is tricuspid. Aortic valve regurgitation is not  visualized. No aortic stenosis is present.   5. The inferior vena cava is normal in size with greater than 50%  respiratory variability, suggesting right atrial pressure of 3 mmHg.   Comparison(s): No prior Echocardiogram from records available.   Conclusion(s)/Recommendation(s): No intracardiac source of embolism  detected on this transthoracic study. Consider a transesophageal  echocardiogram to exclude cardiac source of embolism if clinically  indicated.   Assessment / Plan: 1. Paroxysmal atrial fibrillation and flutter. Patient  has infrequent symptoms.He is anticoagulated with Eliquis . Continue  diltiazem  daily. Avoid NSAIDs. Suggested getting a Kardiomobile device to monitor rhythm 2. Prior CVA on MRI 3. HLD. Excellent response with Crestor . LDL 62  Follow up in 6 months.

## 2023-12-17 ENCOUNTER — Ambulatory Visit: Payer: Medicare Other | Attending: Cardiology | Admitting: Cardiology

## 2023-12-17 ENCOUNTER — Encounter: Payer: Self-pay | Admitting: Cardiology

## 2023-12-17 VITALS — BP 118/82 | HR 73 | Ht 73.0 in | Wt 168.4 lb

## 2023-12-17 DIAGNOSIS — E7800 Pure hypercholesterolemia, unspecified: Secondary | ICD-10-CM | POA: Diagnosis not present

## 2023-12-17 DIAGNOSIS — I480 Paroxysmal atrial fibrillation: Secondary | ICD-10-CM

## 2023-12-17 NOTE — Patient Instructions (Signed)

## 2024-01-03 ENCOUNTER — Telehealth: Payer: Self-pay | Admitting: Cardiology

## 2024-01-03 DIAGNOSIS — I480 Paroxysmal atrial fibrillation: Secondary | ICD-10-CM

## 2024-01-03 MED ORDER — APIXABAN 5 MG PO TABS: 5.0000 mg | ORAL_TABLET | Freq: Two times a day (BID) | ORAL | 1 refills | Status: DC

## 2024-01-03 NOTE — Telephone Encounter (Signed)
 Prescription refill request for Eliquis  received. Indication: Afib  Last office visit: 12/17/23 ([place])  Scr: 0.90 (08/09/23)  Age: 68 Weight: 76.4kg  Appropriate dose. Refill sent.

## 2024-01-03 NOTE — Telephone Encounter (Signed)
*  STAT* If patient is at the pharmacy, call can be transferred to refill team.   1. Which medications need to be refilled? (please list name of each medication and dose if known) apixaban  (ELIQUIS ) 5 MG TABS tablet    2. Would you like to learn more about the convenience, safety, & potential cost savings by using the [facility]?    3. Are you open to using the [facility] (Type [facility].  ).   4. Which pharmacy/location (including street and city if local pharmacy) is medication to be sent to? [facility] - [place], [place] - [address]    5. Do they need a 30 day or 90 day supply? 90 day

## 2024-01-17 DIAGNOSIS — L988 Other specified disorders of the skin and subcutaneous tissue: Secondary | ICD-10-CM | POA: Diagnosis not present

## 2024-01-17 DIAGNOSIS — D485 Neoplasm of uncertain behavior of skin: Secondary | ICD-10-CM | POA: Diagnosis not present

## 2024-02-07 ENCOUNTER — Ambulatory Visit: Payer: Medicare Other | Admitting: Family Medicine

## 2024-02-14 DIAGNOSIS — R194 Change in bowel habit: Secondary | ICD-10-CM | POA: Insufficient documentation

## 2024-02-14 DIAGNOSIS — D509 Iron deficiency anemia, unspecified: Secondary | ICD-10-CM | POA: Insufficient documentation

## 2024-02-14 DIAGNOSIS — K5730 Diverticulosis of large intestine without perforation or abscess without bleeding: Secondary | ICD-10-CM | POA: Insufficient documentation

## 2024-02-14 DIAGNOSIS — R197 Diarrhea, unspecified: Secondary | ICD-10-CM | POA: Insufficient documentation

## 2024-02-15 ENCOUNTER — Ambulatory Visit (INDEPENDENT_AMBULATORY_CARE_PROVIDER_SITE_OTHER): Admitting: Family Medicine

## 2024-02-15 ENCOUNTER — Encounter: Payer: Self-pay | Admitting: Family Medicine

## 2024-02-15 VITALS — BP 110/66 | HR 56 | Temp 97.7°F | Wt 158.6 lb

## 2024-02-15 DIAGNOSIS — E7800 Pure hypercholesterolemia, unspecified: Secondary | ICD-10-CM | POA: Diagnosis not present

## 2024-02-15 DIAGNOSIS — K625 Hemorrhage of anus and rectum: Secondary | ICD-10-CM

## 2024-02-15 DIAGNOSIS — Z8719 Personal history of other diseases of the digestive system: Secondary | ICD-10-CM | POA: Diagnosis not present

## 2024-02-15 DIAGNOSIS — I4891 Unspecified atrial fibrillation: Secondary | ICD-10-CM | POA: Diagnosis not present

## 2024-02-15 DIAGNOSIS — J300 Vasomotor rhinitis: Secondary | ICD-10-CM

## 2024-02-15 DIAGNOSIS — G3184 Mild cognitive impairment, so stated: Secondary | ICD-10-CM

## 2024-02-15 DIAGNOSIS — J310 Chronic rhinitis: Secondary | ICD-10-CM

## 2024-02-15 LAB — LIPID PANEL
Cholesterol: 116 mg/dL (ref 0–200)
HDL: 53 mg/dL (ref >39.00)
LDL Cholesterol: 52 mg/dL (ref 0–99)
NonHDL: 63.26
Total CHOL/HDL Ratio: 2
Triglycerides: 57 mg/dL (ref 0.0–149.0)
VLDL: 11.4 mg/dL (ref 0.0–40.0)

## 2024-02-15 LAB — CBC
HCT: 51.6 % (ref 39.0–52.0)
Hemoglobin: 17.6 g/dL — ABNORMAL HIGH (ref 13.0–17.0)
MCHC: 34.1 g/dL (ref 30.0–36.0)
MCV: 91.3 fl (ref 78.0–100.0)
Platelets: 147 K/uL — ABNORMAL LOW (ref 150.0–400.0)
RBC: 5.65 Mil/uL (ref 4.22–5.81)
RDW: 12.8 % (ref 11.5–15.5)
WBC: 4.8 K/uL (ref 4.0–10.5)

## 2024-02-15 LAB — COMPREHENSIVE METABOLIC PANEL WITH GFR
ALT: 34 U/L (ref 0–53)
AST: 35 U/L (ref 0–37)
Albumin: 4.7 g/dL (ref 3.5–5.2)
Alkaline Phosphatase: 56 U/L (ref 39–117)
BUN: 22 mg/dL (ref 6–23)
CO2: 30 meq/L (ref 19–32)
Calcium: 9.6 mg/dL (ref 8.4–10.5)
Chloride: 102 meq/L (ref 96–112)
Creatinine, Ser: 0.89 mg/dL (ref 0.40–1.50)
GFR: 87.39 mL/min (ref >60.00)
Glucose, Bld: 61 mg/dL — ABNORMAL LOW (ref 70–99)
Potassium: 4.1 meq/L (ref 3.5–5.1)
Sodium: 141 meq/L (ref 135–145)
Total Bilirubin: 1.2 mg/dL (ref 0.2–1.2)
Total Protein: 7.5 g/dL (ref 6.0–8.3)

## 2024-02-15 MED ORDER — AZELASTINE HCL 0.1 % NA SOLN: 1.0000 | Freq: Two times a day (BID) | NASAL | 3 refills | Status: AC | PRN

## 2024-02-15 NOTE — Progress Notes (Signed)
 Subjective:  Patient ID: Nicholas West., male    DOB: 1956/06/29  Age: 68 y.o. MRN: [id number]  CC:  Chief Complaint  Patient presents with   Medical Management of Chronic Issues    HPI Ples Trudel. presents for  Follow up. No new health concerns.   Atrial fibrillation Followed by cardiology, Dr. Swaziland, anticoagulation with Eliquis  and diltiazem  for rate control.  Denies any bleeding, denies chest pain/dyspnea with exertion.  Note reviewed from May 19 with Dr. Swaziland.  Infrequent symptoms.  Kardia mobile device recommended to monitor rhythm.  Continue diltiazem  and Eliquis  and avoidance of NSAIDs.  Excellent response with Crestor  with LDL 62, 55-month follow-up.  Still some intermittent symptoms. No new bleeding, but some intermittent rectal bleeding. Prior hemorrhoids on colonoscopy. Blood with wiping only - BRB, few days per week - not in stool/toilet.  GI: Dr. Dianna. Denies straining or constipation. No melena.   Hyperlipidemia: Crestor  10 mg daily.  History of cerebrovascular disease with subacute cortical infarct in 2023. No new myalgias/side effects. No new weakness.  Lab Results  Component Value Date   CHOL 124 08/09/2023   HDL 48.60 08/09/2023   LDLCALC 62 08/09/2023   TRIG 65.0 08/09/2023   CHOLHDL 3 08/09/2023   Lab Results  Component Value Date   ALT 37 08/09/2023   AST 39 (H) 08/09/2023   ALKPHOS 56 08/09/2023   BILITOT 0.9 08/09/2023   GERD Omeprazole  20 mg daily.  Denies breakthrough symptoms.   Mild cognitive impairment Treated by neurology previously, on Aricept  5 mg daily.  MMSE 29 out of 30 November last year and possible essential tremor noted at that time with continued monitoring. Appt with Camie in November. Neuropsych test in April. Some decline noted.   Vasomotor rhinitis/rhinorrhea Astelin  nasal spray prescribed in January as needed.  This has helped - worked for a few hours, then drip returns.  Using spray once per day - sometimes 2  times per day. Effective for a few hours.no recent steroid ns.    History Patient Active Problem List   Diagnosis Date Noted   Change in bowel habits 02/14/2024   Diarrhea 02/14/2024   Diverticular disease of colon 02/14/2024   Iron deficiency anemia 02/14/2024   Sensorineural hearing loss, bilateral 11/27/2023   Impacted cerumen of both ears 11/27/2023   GERD (gastroesophageal reflux disease) 05/24/2022   Mild cognitive impairment of uncertain or unknown etiology 05/24/2022   Cerebrovascular disease    Lacunar infarction 09/2021   Hiatal hernia 08/02/2020   Paroxysmal atrial fibrillation 10/24/2012   Hypercholesterolemia 04/03/2012   Erectile dysfunction 03/05/2012   Male hypogonadism 03/05/2012   Past Medical History:  Diagnosis Date   Abdominal pain    BCC (basal cell carcinoma) 02/05/2013   Recurrent, top right scalp, MOHs   Cerebrovascular disease    Chest pain    negative stress echo in September 2013   Contusion of right elbow 10/11/2016   Diverticulitis    Dysplastic nevus    moderate, Left back no treatment   Erectile dysfunction 03/05/2012   GERD (gastroesophageal reflux disease)    Hiatal hernia 08/02/2020   Hypercholesterolemia 04/03/2012   Laceration of hand without foreign body 10/11/2016   Lacunar infarction 10/10/2021   MRI - subtle area of subacute gyriform enhancement in the left occipital pole   Male hypogonadism 03/05/2012   Mild cognitive impairment of uncertain or unknown etiology 05/24/2022   Paroxysmal atrial fibrillation 10/24/2012   Past Surgical History:  Procedure Laterality Date   COLON SURGERY     PARTIAL COLECTOMY  11/11   Allergies  Allergen Reactions   Doxycycline Rash   Penicillins     rash   Prior to Admission medications   Medication Sig Start Date End Date Taking? Authorizing Provider  apixaban  (ELIQUIS ) 5 MG TABS tablet Take 1 tablet (5 mg total) by mouth 2 (two) times daily. 01/03/24  Yes Swaziland, Peter M, MD  azelastine   (ASTELIN ) 0.1 % nasal spray Place 1-2 sprays into both nostrils 2 (two) times daily as needed for rhinitis. Use in each nostril as directed 08/09/23  Yes Levora Reyes SAUNDERS, MD  B-D 3CC LUER-LOK SYR 23GX1 23G X 1 3 ML MISC SMARTSIG:1 Injection Once a Week 02/06/20  Yes [provider]  B-D HYPODERMIC NEEDLE 18GX1.5 18G X 1-1/2 MISC USE TO DRAW UP MEDICATION 04/01/20  Yes [provider]  calcium  carbonate (OS-CAL - DOSED IN MG OF ELEMENTAL CALCIUM ) 1250 MG tablet Take 2 tablets by mouth daily with breakfast.   Yes [provider]  cholecalciferol (VITAMIN D ) 1000 UNITS tablet Take 1,000 Units by mouth daily.   Yes [provider]  cyanocobalamin  (VITAMIN B12) 500 MCG tablet Take 500 mcg by mouth daily.   Yes [provider]  diltiazem  (CARDIZEM  CD) 240 MG 24 hr capsule TAKE 1 CAPSULE(240 MG) BY MOUTH DAILY 03/22/23  Yes Swaziland, Peter M, MD  Docusate Sodium (COLACE PO) Take 1 tablet by mouth daily as needed (constipation).   Yes [provider]  donepezil  (ARICEPT ) 5 MG tablet Take 1 tablet (5 mg total) by mouth daily. 06/22/23  Yes Swaziland, Peter M, MD  metroNIDAZOLE  (METROCREAM ) 0.75 % cream Apply 1 Application topically 2 (two) times daily. 06/13/22  Yes [provider]  Multiple Vitamin (MULTIVITAMIN) tablet Take 1 tablet by mouth daily.   Yes [provider]  Omega-3 Fatty Acids (OMEGA 3 PO) Take 2 capsules by mouth daily.    Yes [provider]  omeprazole  (PRILOSEC) 20 MG capsule Take 1 capsule (20 mg total) by mouth daily. 03/05/12  Yes Humberto Elspeth LABOR, MD  Probiotic Product (PROBIOTIC-10 PO) Take 1 tablet by mouth daily.   Yes [provider]  rosuvastatin  (CRESTOR ) 10 MG tablet TAKE 1 TABLET(10 MG) BY MOUTH DAILY 07/18/23  Yes Swaziland, Peter M, MD  sildenafil  (VIAGRA ) 100 MG tablet Take 100 mg by mouth as needed. 08/30/21  Yes [provider]  testosterone  cypionate (DEPOTESTOSTERONE CYPIONATE) 200 MG/ML  injection Inject into the muscle once a week.    Yes [provider]   Social History   Socioeconomic History   Marital status: Married    Spouse name: Not on file   Number of children: 0   Years of education: 8   Highest education level: Professional school degree (e.g., MD, DDS, DVM, JD)  Occupational History   Occupation: attorney    Employer: [employer]  Tobacco Use   Smoking status: Never   Smokeless tobacco: Never  Vaping Use   Vaping status: Never Used  Substance and Sexual Activity   Alcohol use: No   Drug use: No   Sexual activity: Yes  Other Topics Concern   Not on file  Social History Narrative   Married   Exercise: Yes   Education: College   Right handed    Decaffeine    Two story home   Social Drivers of Health   Financial Resource Strain: Low Risk  (02/11/2024)  Overall Financial Resource Strain (CARDIA)    Difficulty of Paying Living Expenses: Not hard at all  Food Insecurity: No Food Insecurity (02/11/2024)   Hunger Vital Sign    Worried About Running Out of Food in the Last Year: Never true    Ran Out of Food in the Last Year: Never true  Transportation Needs: No Transportation Needs (02/11/2024)   PRAPARE - Administrator, Civil Service (Medical): No    Lack of Transportation (Non-Medical): No  Physical Activity: Sufficiently Active (02/11/2024)   Exercise Vital Sign    Days of Exercise per Week: 6 days    Minutes of Exercise per Session: 60 min  Stress: No Stress Concern Present (02/11/2024)   [employer] of Occupational Health - Occupational Stress Questionnaire    Feeling of Stress: Not at all  Social Connections: Moderately Integrated (02/11/2024)   Social Connection and Isolation Panel    Frequency of Communication with Friends and Family: Once a week    Frequency of Social Gatherings with Friends and Family: Once a week    Attends Religious Services: More than 4 times per year    Active Member of [employer] or  Organizations: Yes    Attends Engineer, structural: More than 4 times per year    Marital Status: Married  Catering manager Violence: Not At Risk (10/09/2023)   Humiliation, Afraid, Rape, and Kick questionnaire    Fear of Current or Ex-Partner: No    Emotionally Abused: No    Physically Abused: No    Sexually Abused: No    Review of Systems  Constitutional:  Negative for fatigue and unexpected weight change.  Eyes:  Negative for visual disturbance.  Respiratory:  Negative for cough, chest tightness and shortness of breath.   Cardiovascular:  Negative for chest pain, palpitations and leg swelling.  Gastrointestinal:  Positive for anal bleeding. Negative for abdominal pain, blood in stool, constipation and diarrhea.  Neurological:  Negative for dizziness, light-headedness and headaches.     Objective:   Vitals:   02/15/24 0921  BP: 110/66  Pulse: (!) 56  Temp: 97.7 F (36.5 C)  TempSrc: Oral  SpO2: 98%  Weight: 158 lb 9.6 oz (71.9 kg)     Physical Exam Vitals reviewed.  Constitutional:      Appearance: He is well-developed.  HENT:     Head: Normocephalic and atraumatic.     Nose: Nose normal. No congestion or rhinorrhea.  Neck:     Vascular: No carotid bruit or JVD.  Cardiovascular:     Rate and Rhythm: Normal rate and regular rhythm.     Heart sounds: Normal heart sounds. No murmur heard. Pulmonary:     Effort: Pulmonary effort is normal.     Breath sounds: Normal breath sounds. No rales.  Musculoskeletal:     Right lower leg: No edema.     Left lower leg: No edema.  Skin:    General: Skin is warm and dry.  Neurological:     Mental Status: He is alert and oriented to person, place, and time.  Psychiatric:        Mood and Affect: Mood normal.        Assessment & Plan:  Lesslie Mckeehan. is a 67 y.o. male . Hypercholesterolemia - Plan: Comprehensive metabolic panel with GFR, Lipid panel  - Check labs and adjust plan accordingly.  Tolerating  current regimen.  Goal LDL below 70 with history of CVD.   Vasomotor rhinitis  Rhinitis, unspecified type - Plan: azelastine  (ASTELIN ) 0.1 % nasal spray  - Continue Astelin  nasal spray with option to add Flonase  to see if that may provide some additional benefit with RTC precautions given.  BRBPR (bright red blood per rectum) - Plan: CBC History of hemorrhoids - Plan: CBC  - History of hemorrhoids, likely cause of BRBPR.  Denies other bleeding but cautioned with RTC precautions given, ER precautions given with use of anticoagulation.  Recommended follow-up with his gastroenterologist if more frequent issues with hemorrhoids.  Atrial fibrillation, unspecified type (HCC) - Plan: Comprehensive metabolic panel with GFR  - Intermittent symptoms, Kardia mobile device has been recommended by his specialist, recommended again today.  No med changes at this time.  Reassuring exam in office.  Mild cognitive impairment  - With some potential worsening based on testing as above, follow-up with memory specialist, no new meds for now.  Meds ordered this encounter  Medications   azelastine  (ASTELIN ) 0.1 % nasal spray    Sig: Place 1-2 sprays into both nostrils 2 (two) times daily as needed for rhinitis. Use in each nostril as directed    Dispense:  30 mL    Refill:  3   Patient Instructions  Kardia Mobile device is an option mentioned by Dr. Swaziland can can detect atrial fibrillation.  If you are having more difficulty with hemorrhoids, I recommend contacting your gastroenterologist for an appointment.  Ok to use the astelin  nasal spray up to twice per day. You can try adding flonase  nasal spray over the counter once per day to see if that helps runny nose. Follow up if not effective.  No other med changes today. Take care!       Signed,   Reyes Pines, MD [facility], [facility] 02/15/24 9:46 AM

## 2024-02-15 NOTE — Patient Instructions (Addendum)
 Kardia Mobile device is an option mentioned by Dr. Swaziland can can detect atrial fibrillation.  If you are having more difficulty with hemorrhoids, I recommend contacting your gastroenterologist for an appointment.  Ok to use the astelin  nasal spray up to twice per day. You can try adding flonase  nasal spray over the counter once per day to see if that helps runny nose. Follow up if not effective.  No other med changes today. Take care!

## 2024-02-16 ENCOUNTER — Ambulatory Visit: Payer: Self-pay | Admitting: Family Medicine

## 2024-03-12 DIAGNOSIS — K080 Exfoliation of teeth due to systemic causes: Secondary | ICD-10-CM | POA: Diagnosis not present

## 2024-04-11 DIAGNOSIS — K625 Hemorrhage of anus and rectum: Secondary | ICD-10-CM | POA: Diagnosis not present

## 2024-04-29 ENCOUNTER — Other Ambulatory Visit (HOSPITAL_BASED_OUTPATIENT_CLINIC_OR_DEPARTMENT_OTHER): Payer: Self-pay

## 2024-04-29 MED ORDER — COMIRNATY 30 MCG/0.3ML IM SUSY
0.3000 mL | PREFILLED_SYRINGE | Freq: Once | INTRAMUSCULAR | 0 refills | Status: AC
  Filled 2024-04-29: qty 0.3, 1d supply, fill #0

## 2024-05-14 DIAGNOSIS — K641 Second degree hemorrhoids: Secondary | ICD-10-CM | POA: Diagnosis not present

## 2024-05-16 ENCOUNTER — Other Ambulatory Visit (HOSPITAL_BASED_OUTPATIENT_CLINIC_OR_DEPARTMENT_OTHER): Payer: Self-pay

## 2024-05-16 MED ORDER — FLUZONE HIGH-DOSE 0.5 ML IM SUSY
0.5000 mL | PREFILLED_SYRINGE | Freq: Once | INTRAMUSCULAR | 0 refills | Status: AC
  Filled 2024-05-16: qty 0.5, 1d supply, fill #0

## 2024-06-12 NOTE — Progress Notes (Signed)
 Nicholas B Weyers Jr. Date of Birth: 1956/05/30 Medical Record #[id number]  History of Present Illness: Nicholas West is seen for follow up of atrial fibrillation.   He has a history of paroxysmal atrial fibrillation. He has been managed with rate control with diltiazem . He previously had a Chad vascular score of 0 and was  taking a baby aspirin daily.   In 2019 he complained of nausea and tingling in his arms with exercise. We arranged a POET. On arrival he was in AFib with rate 98. He exercised for 9 minutes on the Bruce protocol without symptoms or ST changes. He converted to NSR then went back into atrial flutter with RVR. His Cardizem  was increased to 240 mg daily.    In 2023 he was experiencing some issues with his balance and memory loss. He had a couple of falls. Was evaluated by Dr Dina. EEG and extensive lab work OK. Cranial MRI showed evidence of subacute infarct in the  left occipital pole. MRA showed modest stenosis in the A2 segment of the right anterior cerebral artery. He was subsequently started on Eliquis   On follow up today he is doing very well. No chest pain or SOB. He does note some Afib but less than once a week and resolves spontaneously. Saw Neuro today and Aricept  dose increased. Plan MRI/MRA in 6 months.  Current Outpatient Medications on File Prior to Visit  Medication Sig Dispense Refill   apixaban  (ELIQUIS ) 5 MG TABS tablet Take 1 tablet (5 mg total) by mouth 2 (two) times daily. 180 tablet 1   azelastine  (ASTELIN ) 0.1 % nasal spray Place 1-2 sprays into both nostrils 2 (two) times daily as needed for rhinitis. Use in each nostril as directed 30 mL 3   B-D 3CC LUER-LOK SYR 23GX1 23G X 1 3 ML MISC SMARTSIG:1 Injection Once a Week     B-D HYPODERMIC NEEDLE 18GX1.5 18G X 1-1/2 MISC USE TO DRAW UP MEDICATION     calcium  carbonate (OS-CAL - DOSED IN MG OF ELEMENTAL CALCIUM ) 1250 MG tablet Take 2 tablets by mouth daily with breakfast.     cholecalciferol (VITAMIN D ) 1000  UNITS tablet Take 1,000 Units by mouth daily.     cyanocobalamin  (VITAMIN B12) 500 MCG tablet Take 500 mcg by mouth daily.     diltiazem  (CARDIZEM  CD) 240 MG 24 hr capsule TAKE 1 CAPSULE(240 MG) BY MOUTH DAILY 90 capsule 3   Docusate Sodium (COLACE PO) Take 1 tablet by mouth daily as needed (constipation).     donepezil  (ARICEPT ) 10 MG tablet Take 1 tablet (10 mg total) by mouth daily. 90 tablet 3   metroNIDAZOLE  (METROCREAM ) 0.75 % cream Apply 1 Application topically 2 (two) times daily.     Multiple Vitamin (MULTIVITAMIN) tablet Take 1 tablet by mouth daily.     Omega-3 Fatty Acids (OMEGA 3 PO) Take 2 capsules by mouth daily.      omeprazole  (PRILOSEC) 20 MG capsule Take 1 capsule (20 mg total) by mouth daily. 30 capsule 11   Probiotic Product (PROBIOTIC-10 PO) Take 1 tablet by mouth daily.     rosuvastatin  (CRESTOR ) 10 MG tablet TAKE 1 TABLET(10 MG) BY MOUTH DAILY 90 tablet 1   sildenafil  (VIAGRA ) 100 MG tablet Take 100 mg by mouth as needed.     testosterone  cypionate (DEPOTESTOSTERONE CYPIONATE) 200 MG/ML injection Inject into the muscle once a week.      No current facility-administered medications on file prior to visit.    Allergies  Allergen  Reactions   Doxycycline Rash and Dermatitis   Penicillins Dermatitis and Hives    rash    Past Medical History:  Diagnosis Date   Abdominal pain    BCC (basal cell carcinoma) 02/05/2013   Recurrent, top right scalp, MOHs   Cerebrovascular disease    Chest pain    negative stress echo in September 2013   Contusion of right elbow 10/11/2016   Diverticulitis    Dysplastic nevus    moderate, Left back no treatment   Erectile dysfunction 03/05/2012   GERD (gastroesophageal reflux disease)    Hiatal hernia 08/02/2020   Hypercholesterolemia 04/03/2012   Laceration of hand without foreign body 10/11/2016   Lacunar infarction 10/10/2021   MRI - subtle area of subacute gyriform enhancement in the left occipital pole   Male hypogonadism  03/05/2012   Mild cognitive impairment of uncertain or unknown etiology 05/24/2022   Paroxysmal atrial fibrillation 10/24/2012    Past Surgical History:  Procedure Laterality Date   COLON SURGERY     PARTIAL COLECTOMY  11/11    Social History   Tobacco Use  Smoking Status Never  Smokeless Tobacco Never    Social History   Substance and Sexual Activity  Alcohol Use No    Family History  Problem Relation Age of Onset   Cancer Mother    Alzheimer's disease Father        onset in mid-60s   Colon polyps Father    GER disease Father    Dementia Sister        unspecified type; rapidly progressing   Heart disease Paternal Grandmother    Cancer Paternal Grandfather     Review of Systems: The review of systems is per the HPI.  All other systems were reviewed and are negative.  Physical Exam: BP (!) 104/59   Pulse (!) 57   Ht 6' 1 (1.854 m)   Wt 165 lb 12.8 oz (75.2 kg)   SpO2 98%   BMI 21.87 kg/m  GENERAL:  Well appearing WM in NAD HEENT:  PERRL, EOMI, sclera are clear. Oropharynx is clear. NECK:  No jugular venous distention, carotid upstroke brisk and symmetric, no bruits, no thyromegaly or adenopathy LUNGS:  Clear to auscultation bilaterally CHEST:  Unremarkable HEART:  RRR,  PMI not displaced or sustained,S1 and S2 within normal limits, no S3, no S4: no clicks, no rubs, no murmurs ABD:  Soft, nontender. BS +, no masses or bruits. No hepatomegaly, no splenomegaly EXT:  2 + pulses throughout, no edema, no cyanosis no clubbing SKIN:  Warm and dry.  No rashes NEURO:  Alert and oriented x 3. Cranial nerves II through XII intact. PSYCH:  Cognitively intact  LABORATORY DATA:   Lab Results  Component Value Date   WBC 4.8 02/15/2024   HGB 17.6 (H) 02/15/2024   HCT 51.6 02/15/2024   PLT 147.0 (L) 02/15/2024   GLUCOSE 61 (L) 02/15/2024   CHOL 116 02/15/2024   TRIG 57.0 02/15/2024   HDL 53.00 02/15/2024   LDLCALC 52 02/15/2024   ALT 34 02/15/2024   AST 35  02/15/2024   NA 141 02/15/2024   K 4.1 02/15/2024   CL 102 02/15/2024   CREATININE 0.89 02/15/2024   BUN 22 02/15/2024   CO2 30 02/15/2024   TSH 2.05 09/16/2021   PSA 1.38 10/29/2015   INR 1.10 06/04/2013   HGBA1C 5.4 08/04/2021   EKG Interpretation Date/Time:  Monday June 16 2024 15:09:45 EST Ventricular Rate:  57 PR Interval:  192 QRS Duration:  90 QT Interval:  386 QTC Calculation: 375 R Axis:   45  Text Interpretation: Sinus bradycardia Possible Left atrial enlargement When compared with ECG of 10-Jun-2023 09:03, No significant change was found Confirmed by Kaidence Callaway [id number]) on 06/16/2024 3:13:08 PM    ETT 08/31/17: Study Highlights   Blood pressure demonstrated a blunted response to exercise. Patient started the test in atrial fibrillation and converted to sinus rhythm and subsequently atrial flutter with rapid ventricular response with exercise. There was no ST segment deviation noted during stress. Negative, adequate stress test.   Echo 10/25/21: IMPRESSIONS     1. Left ventricular ejection fraction, by estimation, is 60%. Left  ventricular ejection fraction by 3D volume is 58 %. The left ventricle has  normal function. The left ventricle has no regional wall motion  abnormalities. Left ventricular diastolic  parameters are consistent with Grade I diastolic dysfunction (impaired  relaxation). The average left ventricular global longitudinal strain is  -20.0 %. The global longitudinal strain is normal.   2. Right ventricular systolic function is normal. The right ventricular  size is normal. Tricuspid regurgitation signal is inadequate for assessing  PA pressure.   3. The mitral valve is normal in structure. Trivial mitral valve  regurgitation. No evidence of mitral stenosis.   4. The aortic valve is tricuspid. Aortic valve regurgitation is not  visualized. No aortic stenosis is present.   5. The inferior vena cava is normal in size with greater than 50%   respiratory variability, suggesting right atrial pressure of 3 mmHg.   Comparison(s): No prior Echocardiogram from records available.   Conclusion(s)/Recommendation(s): No intracardiac source of embolism  detected on this transthoracic study. Consider a transesophageal  echocardiogram to exclude cardiac source of embolism if clinically  indicated.   Assessment / Plan: 1. Paroxysmal atrial fibrillation and flutter. Patient has infrequent symptoms.He is anticoagulated with Eliquis . In NSR today. Continue  diltiazem  daily. Avoid NSAIDs.  2. Prior CVA on MRI- followed by Neuro 3. HLD. Excellent response with Crestor . LDL 52  Follow up in 6 months.

## 2024-06-15 NOTE — Progress Notes (Signed)
 Assessment/Plan:    Mild cognitive impairment, concern for vascular etiology  Nicholas West. is a very pleasant 68 y.o. RH male with a history ofhyperlipidemia, Afib,known intention tremors,  and a diagnosis of MCI of unclear etiology with concerns for vascular disease per neuropsych evaluation 05/2022  presenting today in follow-up for evaluation of memory loss. Patient is on donepezil  5 mg daily, discussed increasing to 10 mg for broader coverage, he agrees to proceed. Patient is able to participate on ADLs and to to drive without difficulties       Recommendations:   Follow up in   months. Increase donepezil  to 10 mg daily, side effects dicussed *** Repeat neuropsychological evaluation  in about 6-12 months for diagnostic clarity and disease trajectory  Recommend good control of cardiovascular risk factors Continue to control mood as per PCP Monitor intention tremors.    Subjective:   This patient is accompanied in the office by his wife***  who supplements the history. Previous records as well as any outside records available were reviewed prior to todays visit.   Patient was last seen on 06/15/2023  with MMSE 29/30 ***.    Any changes in memory since last visit? .Patient has some difficulty remembering recent conversations and names of people , worse if interrupted at which time he may lose the train of conversation or resuming a task. Writes a list and uses a calendar not to forget.  LTM is good.  repeats oneself?  Endorsed Disoriented when walking into a room?  Patient denies but if asked to go to the pantry he may go to the laundry room, wife reports ***  Misplacing objects?  Patient denies   Wandering behavior?   Denies. Any personality changes since last visit? Denies.   Any worsening depression?: denies.   Hallucinations or paranoia?  Denies.   Seizures?   Denies.    Any sleep changes? Sleeps well***. Does not sleep very well***.   Denies vivid dreams, REM  behavior or sleepwalking   Sleep apnea?   denies ***  Any hygiene concerns?   Denies.   Independent of bathing and dressing?  Endorsed  Does the patient needs help with medications? Patient is in charge, wife prepares the pillbox. *** Who is in charge of the finances?  Patient and wife are in charge   *** Any changes in appetite?  Denies, admits not drinking enough water ***   Patient have trouble swallowing?  Denies.   Does the patient cook?  Any kitchen accidents such as leaving the stove on?   Denies.   Any headaches?    Denies.   Vision changes? Denies. Chronic pain?  Denies.   Ambulates with difficulty?    Denies. ***  Recent falls or head injuries?    Denies.      Unilateral weakness, numbness or tingling?  Denies.   Any tremors? Minimal B intention tremors, and head-bobbing for the last year, not interfering with his ADLs. Denies other parkinsonian signs   Any anosmia?    Denies.   Any incontinence of urine?  Denies.   Any bowel dysfunction?  Denies.      Patient lives with his wife.*** Does the patient drive? Yes, denies getting lost, but uses the GPS if needed***    Neuropsych evaluation  10/30/2023 Dr. Richie  Briefly, results suggested an isolated impairment surrounding phonemic fluency. Further performance variability was exhibited across processing speed and both delayed retrieval and recognition/consolidation aspects of memory. Relative to his  previous evaluation on October 2023, objective decline was exhibited across processing speed, as well as both list-based and daily living verbal memory tasks. During interview, [name] noted an increased tendency towards a slow, intentional, and deliberate approach to task completion throughout his life to ensure greater accuracy. This could potentially account for some decline across a few tasks (TMT A&B, Coding) as lower performances reflect slower completion time (i.e., processing speed) rather than diminished accuracy. Across memory  testing, decline was isolated to 2 out of 4 tasks. More specifically, retention rates declined from 117% to 0% across a list learning task and 94% to 41% across a daily living task. Recognition scores across these tasks also saw noteworthy decline. However, performances were stable and normatively appropriate across story and shape learning tasks. Semantic fluency saw a mild improvement while other assessed cognitive domains exhibited stability. The cause for objective decline remains uncertain at the present time. As highlighted previously, past neuroimaging has suggested moderate microvascular ischemic disease with some sign of progression over time. Broadly speaking, cerebrovascular disease can negatively impact phonemic fluency and delayed retrieval aspects of memory. This remains a plausible explanation. However, decline/dysfunction across memory storage does raise additional concern for an underlying neurological process as this would be a somewhat unexpected finding in a purely vascular presentation. Overall, I cannot rule out a concurrent neurological disease process in extremely early stages. However, it remains premature to rule in such a process with any degree of confidence.         Initial Visit 09/16/21 The patient is seen in neurologic consultation at the request of Levora Reyes SAUNDERS, MD for the evaluation of memory.  The patient is accompanied by his wife  who supplements the history. 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  She states that he cannot remember recent conversations, and within 15 minutes he will ask again.  His short-term memory is worse than the long-term memory.  He denies anyone noticing at work that he has any cognitive difficulties.  She states that he used to be able to drive without difficulties, and now, while driving, he is distracted,  and at times will pass the turn.  He cannot multitask with directions.  Despite having a calendar, first in the morning his wife has to put a note in the mirror in the bathroom otherwise he will forget.  She states that he repeats himself more frequently. He denies being disoriented when walking into a room.  He denies leaving objects in unusual places.  He ambulates without difficulty,  He has some unsteadiness on his feet, especially when taking steps up to the kitchen from the garage.  He has to hold the railing otherwise he would fall.  This has been present for several months, during the falls, he denies any loss of consciousness, I only hurt to my pride  he says.  In July, during a family vacation, he states that his feet went unsteady, but then again the sand was too soft .  He does report for many years leaning to the left.  He denies any prior history of TIA or stroke.  He is unsure if the blood pressure medications may be contributing to the sense dizziness.  He denies any lack of sensations on his legs or arms.  He also has noticed that for several years he had some  right greater than left head bobbing, and his wife reports that sometimes he has some left jerky movements of his arm.  He denies any history of seizures or Parkinsonism.  His mood is good, denies depression or irritability.  His wife states that he has significant REM behavior at night, moving and tossing, possibly acting out.  He does not know if he sleeps well.  He denies remembering any vivid dreams or sleepwalking.  He denies hallucinations or paranoia. History of 2 falls previously, no recent falls.  Reports sensation of his legs not cooperating and losing balance. Had to catch self once since last visit. No pain, just unsteady.  His appetite is good, he eats 3 meals a day, drinking plenty fluids during the day .  He is active, he does occasionally fast to walk during the week.  Denies any hygiene concerns, he is independent of  bathing and dressing.  His wife puts the medications out in a pillbox for him while he is in the shower.  They do the finances together, he reports no issues.  He was concerned recently, because at work, he could not remember the password in his computer, needing to get it out of his own personal computer.  He denies any headaches, he had some episodes of double vision, last one recently went back in to go to the mountains, when he got hot and tired .  He denies any blurred vision.  He denies any dizziness, focal numbness or tingling, or anosmia.  No history of seizures.  He denies any tremors.  He denies urine incontinence, retention, constipation or diarrhea.  He denies a history of sleep apnea, alcohol, tobacco. He reports a history of melanocytic nevus s.p large excision of the scal on the left. He also had noticed a recent rash on the forehead. He has an appt with the dermatologist soon. His sister died with the advanced dementia at 63, father with Alzheimer's disease at 39.    MRI brain without contrast 10/10/21 Moderate for age cerebral white matter signal changes are nonspecific but most commonly due to chronic small vessel disease. And a subtle area of gyriform enhancement in the left occipital pole is most suggestive of a subacute cortical infarct. No associated edema or mass effect. No other acute intracranial abnormality.   MRA 10/26/20 No intracranial large vessel occlusion.Apparent moderate stenosis within the A2 segment of the right anterior cerebral artery.   Past Medical History:  Diagnosis Date   Abdominal pain    BCC (basal cell carcinoma) 02/05/2013   Recurrent, top right scalp, MOHs   Cerebrovascular disease    Chest pain    negative stress echo in September 2013   Contusion of right elbow 10/11/2016   Diverticulitis    Dysplastic nevus    moderate, Left back no treatment   Erectile dysfunction 03/05/2012   GERD (gastroesophageal reflux disease)    Hiatal hernia 08/02/2020    Hypercholesterolemia 04/03/2012   Laceration of hand without foreign body 10/11/2016   Lacunar infarction 10/10/2021   MRI - subtle area of subacute gyriform enhancement in the left occipital pole   Male hypogonadism 03/05/2012   Mild cognitive impairment of uncertain or unknown etiology 05/24/2022   Paroxysmal atrial fibrillation 10/24/2012     Past Surgical History:  Procedure Laterality Date   COLON SURGERY     PARTIAL COLECTOMY  11/11     PREVIOUS MEDICATIONS:   CURRENT MEDICATIONS:  Outpatient Encounter Medications as of 06/16/2024  Medication Sig  apixaban  (ELIQUIS ) 5 MG TABS tablet Take 1 tablet (5 mg total) by mouth 2 (two) times daily.   azelastine  (ASTELIN ) 0.1 % nasal spray Place 1-2 sprays into both nostrils 2 (two) times daily as needed for rhinitis. Use in each nostril as directed   B-D 3CC LUER-LOK SYR 23GX1 23G X 1 3 ML MISC SMARTSIG:1 Injection Once a Week   B-D HYPODERMIC NEEDLE 18GX1.5 18G X 1-1/2 MISC USE TO DRAW UP MEDICATION   calcium  carbonate (OS-CAL - DOSED IN MG OF ELEMENTAL CALCIUM ) 1250 MG tablet Take 2 tablets by mouth daily with breakfast.   cholecalciferol (VITAMIN D ) 1000 UNITS tablet Take 1,000 Units by mouth daily.   cyanocobalamin  (VITAMIN B12) 500 MCG tablet Take 500 mcg by mouth daily.   diltiazem  (CARDIZEM  CD) 240 MG 24 hr capsule TAKE 1 CAPSULE(240 MG) BY MOUTH DAILY   Docusate Sodium (COLACE PO) Take 1 tablet by mouth daily as needed (constipation).   donepezil  (ARICEPT ) 5 MG tablet Take 1 tablet (5 mg total) by mouth daily.   metroNIDAZOLE  (METROCREAM ) 0.75 % cream Apply 1 Application topically 2 (two) times daily.   Multiple Vitamin (MULTIVITAMIN) tablet Take 1 tablet by mouth daily.   Omega-3 Fatty Acids (OMEGA 3 PO) Take 2 capsules by mouth daily.    omeprazole  (PRILOSEC) 20 MG capsule Take 1 capsule (20 mg total) by mouth daily.   Probiotic Product (PROBIOTIC-10 PO) Take 1 tablet by mouth daily.   rosuvastatin  (CRESTOR ) 10 MG tablet  TAKE 1 TABLET(10 MG) BY MOUTH DAILY   sildenafil  (VIAGRA ) 100 MG tablet Take 100 mg by mouth as needed.   testosterone  cypionate (DEPOTESTOSTERONE CYPIONATE) 200 MG/ML injection Inject into the muscle once a week.    No facility-administered encounter medications on file as of 06/16/2024.     Objective:     PHYSICAL EXAMINATION:    VITALS:  There were no vitals filed for this visit.  GEN:  The patient appears stated age and is in NAD. HEENT:  Normocephalic, atraumatic.   Neurological examination:  General: NAD, well-groomed, appears stated age. Orientation: The patient is alert. Oriented to person, place and not to date.*** Cranial nerves: There is good facial symmetry.The speech is fluent and clear. No aphasia or dysarthria. Fund of knowledge is appropriate. Recent memory impaired and remote memory is normal.  Attention and concentration are normal.  Able to name objects and repeat phrases.  Hearing is intact to conversational tone ***.   Delayed recall *** Sensation: Sensation is intact to light touch throughout Motor: Strength is at least antigravity x4. DTR's 2/4 in UE/LE      09/18/2021    8:00 PM  Montreal Cognitive Assessment   Visuospatial/ Executive (0/5) 4  Naming (0/3) 3  Attention: Read list of digits (0/2) 2  Attention: Read list of letters (0/1) 1  Attention: Serial 7 subtraction starting at 100 (0/3) 3  Language: Repeat phrase (0/2) 2  Language : Fluency (0/1) 1  Abstraction (0/2) 2  Delayed Recall (0/5) 5  Orientation (0/6) 6  Total 29  Adjusted Score (based on education) 29       06/16/2023    4:00 PM  MMSE - Mini Mental State Exam  Orientation to time 4  Orientation to Place 5  Registration 3  Attention/ Calculation 5  Recall 3  Language- name 2 objects 2  Language- repeat 1  Language- follow 3 step command 3  Language- read & follow direction 1  Write a sentence 1  Copy  design 1  Total score 29       Movement examination: Tone: There  is normal tone in the UE/LE Abnormal movements:  mild B intention tremor B, no resting tremor. Mild head tremor. *** tremor.  No myoclonus.  No asterixis.   Coordination:  There is no decremation with RAM's. Normal finger to nose  Gait and Station: The patient has no difficulty arising out of a deep-seated chair without the use of the hands. The patient's stride length is good.  Gait is cautious and narrow.   Thank you for allowing us  the opportunity to participate in the care of this nice patient. Please do not hesitate to contact us  for any questions or concerns.   Total time spent on today's visit was *** minutes dedicated to this patient today, preparing to see patient, examining the patient, ordering tests and/or medications and counseling the patient, documenting clinical information in the EHR or other health record, independently interpreting results and communicating results to the patient/family, discussing treatment and goals, answering patient's questions and coordinating care.  Cc:  Levora Reyes SAUNDERS, MD  Camie Sevin 06/15/2024 5:11 PM

## 2024-06-16 ENCOUNTER — Encounter: Payer: Self-pay | Admitting: Physician Assistant

## 2024-06-16 ENCOUNTER — Ambulatory Visit: Payer: Medicare Other | Admitting: Physician Assistant

## 2024-06-16 ENCOUNTER — Ambulatory Visit: Attending: Cardiology | Admitting: Cardiology

## 2024-06-16 ENCOUNTER — Encounter: Payer: Self-pay | Admitting: Cardiology

## 2024-06-16 VITALS — BP 104/59 | HR 57 | Ht 73.0 in | Wt 165.8 lb

## 2024-06-16 VITALS — BP 117/75 | HR 70 | Resp 20 | Wt 163.0 lb

## 2024-06-16 DIAGNOSIS — I679 Cerebrovascular disease, unspecified: Secondary | ICD-10-CM

## 2024-06-16 DIAGNOSIS — E7800 Pure hypercholesterolemia, unspecified: Secondary | ICD-10-CM

## 2024-06-16 DIAGNOSIS — R413 Other amnesia: Secondary | ICD-10-CM | POA: Diagnosis not present

## 2024-06-16 DIAGNOSIS — I639 Cerebral infarction, unspecified: Secondary | ICD-10-CM

## 2024-06-16 DIAGNOSIS — I480 Paroxysmal atrial fibrillation: Secondary | ICD-10-CM | POA: Diagnosis not present

## 2024-06-16 DIAGNOSIS — G3184 Mild cognitive impairment, so stated: Secondary | ICD-10-CM

## 2024-06-16 MED ORDER — DONEPEZIL HCL 10 MG PO TABS: 10.0000 mg | ORAL_TABLET | Freq: Every day | ORAL | 3 refills | Status: AC

## 2024-06-16 NOTE — Patient Instructions (Signed)
 Medication Instructions:  Continue same medications *If you need a refill on your cardiac medications before your next appointment, please call your pharmacy*  Lab Work: None ordered  Testing/Procedures: None ordered  Follow-Up: At [facility], you and your health needs are our priority.  As part of our continuing mission to provide you with exceptional heart care, our providers are all part of one team.  This team includes your primary Cardiologist (physician) and Advanced Practice Providers or APPs (Physician Assistants and Nurse Practitioners) who all work together to provide you with the care you need, when you need it.  Your next appointment:  6 months    Call in March to schedule May appointment     Provider:  Dr.Jordan   We recommend signing up for the patient portal called MyChart.  Sign up information is provided on this After Visit Summary.  MyChart is used to connect with patients for Virtual Visits (Telemedicine).  Patients are able to view lab/test results, encounter notes, upcoming appointments, etc.  Non-urgent messages can be sent to your provider as well.   To learn more about what you can do with MyChart, go to [email or web address].

## 2024-06-16 NOTE — Patient Instructions (Addendum)
 It was a pleasure to see you today at our office.   Recommendations:  Continue Eliquis  daily Increase  Donepezil  10 mg daily. Side effects discussed   Follow up in  January 06 2025 at 11:30  MRI brain / MRA head and neck in March or April of 2026 Repeat neuropsych in May 2026     RECOMMENDATIONS FOR ALL PATIENTS WITH MEMORY PROBLEMS: 1. Continue to exercise (Recommend 30 minutes of walking everyday, or 3 hours every week) 2. Increase social interactions - continue going to [place] and enjoy social gatherings with friends and family 3. Eat healthy, avoid fried foods and eat more fruits and vegetables 4. Maintain adequate blood pressure, blood sugar, and blood cholesterol level. Reducing the risk of stroke and cardiovascular disease also helps promoting better memory. 5. Avoid stressful situations. Live a simple life and avoid aggravations. Organize your time and prepare for the next day in anticipation. 6. Sleep well, avoid any interruptions of sleep and avoid any distractions in the bedroom that may interfere with adequate sleep quality 7. Avoid sugar, avoid sweets as there is a strong link between excessive sugar intake, diabetes, and cognitive impairment We discussed the Mediterranean diet, which has been shown to help patients reduce the risk of progressive memory disorders and reduces cardiovascular risk. This includes eating fish, eat fruits and green leafy vegetables, nuts like almonds and hazelnuts, walnuts, and also use olive oil. Avoid fast foods and fried foods as much as possible. Avoid sweets and sugar as sugar use has been linked to worsening of memory function.  There is always a concern of gradual progression of memory problems. If this is the case, then we may need to adjust level of care according to patient needs. Support, both to the patient and caregiver, should then be put into place.    FALL PRECAUTIONS: Be cautious when walking. Scan the area for obstacles that may  increase the risk of trips and falls. When getting up in the mornings, sit up at the edge of the bed for a few minutes before getting out of bed. Consider elevating the bed at the head end to avoid drop of blood pressure when getting up. Walk always in a well-lit room (use night lights in the walls). Avoid area rugs or power cords from appliances in the middle of the walkways. Use a walker or a cane if necessary and consider physical therapy for balance exercise. Get your eyesight checked regularly.  FINANCIAL OVERSIGHT: Supervision, especially oversight when making financial decisions or transactions is also recommended.  HOME SAFETY: Consider the safety of the kitchen when operating appliances like stoves, microwave oven, and blender. Consider having supervision and share cooking responsibilities until no longer able to participate in those. Accidents with firearms and other hazards in the house should be identified and addressed as well.   ABILITY TO BE LEFT ALONE: If patient is unable to contact 911 operator, consider using LifeLine, or when the need is there, arrange for someone to stay with patients. Smoking is a fire hazard, consider supervision or cessation. Risk of wandering should be assessed by caregiver and if detected at any point, supervision and safe proof recommendations should be instituted.  MEDICATION SUPERVISION: Inability to self-administer medication needs to be constantly addressed. Implement a mechanism to ensure safe administration of the medications.   DRIVING: Regarding driving, in patients with progressive memory problems, driving will be impaired. We advise to have someone else do the driving if trouble finding directions or if  minor accidents are reported. Independent driving assessment is available to determine safety of driving.   If you are interested in the driving assessment, you can contact the following:  [facility] [employer] [facility] in [place]  [phone]  [facility] [phone]  [facility] [phone] or [phone]    Mediterranean Diet A Mediterranean diet refers to food and lifestyle choices that are based on the traditions of countries located on the [employer]. This way of eating has been shown to help prevent certain conditions and improve outcomes for people who have chronic diseases, like kidney disease and heart disease. What are tips for following this plan? Lifestyle  Cook and eat meals together with your family, when possible. Drink enough fluid to keep your urine clear or pale yellow. Be physically active every day. This includes: Aerobic exercise like running or swimming. Leisure activities like gardening, walking, or housework. Get 7-8 hours of sleep each night. If recommended by your health care provider, drink red wine in moderation. This means 1 glass a day for nonpregnant women and 2 glasses a day for men. A glass of wine equals 5 oz (150 mL). Reading food labels  Check the serving size of packaged foods. For foods such as rice and pasta, the serving size refers to the amount of cooked product, not dry. Check the total fat in packaged foods. Avoid foods that have saturated fat or trans fats. Check the ingredients list for added sugars, such as corn syrup. Shopping  At the grocery store, buy most of your food from the areas near the walls of the store. This includes: Fresh fruits and vegetables (produce). Grains, beans, nuts, and seeds. Some of these may be available in unpackaged forms or large amounts (in bulk). Fresh seafood. Poultry and eggs. Low-fat dairy products. Buy whole ingredients instead of prepackaged foods. Buy fresh fruits and vegetables in-season from local farmers markets. Buy frozen fruits and vegetables in resealable bags. If you do not have access to quality fresh seafood, buy precooked frozen shrimp or canned  fish, such as tuna, salmon, or sardines. Buy small amounts of raw or cooked vegetables, salads, or olives from the deli or salad bar at your store. Stock your pantry so you always have certain foods on hand, such as olive oil, canned tuna, canned tomatoes, rice, pasta, and beans. Cooking  Cook foods with extra-virgin olive oil instead of using butter or other vegetable oils. Have meat as a side dish, and have vegetables or grains as your main dish. This means having meat in small portions or adding small amounts of meat to foods like pasta or stew. Use beans or vegetables instead of meat in common dishes like chili or lasagna. Experiment with different cooking methods. Try roasting or broiling vegetables instead of steaming or sauteing them. Add frozen vegetables to soups, stews, pasta, or rice. Add nuts or seeds for added healthy fat at each meal. You can add these to yogurt, salads, or vegetable dishes. Marinate fish or vegetables using olive oil, lemon juice, garlic, and fresh herbs. Meal planning  Plan to eat 1 vegetarian meal one day each week. Try to work up to 2 vegetarian meals, if possible. Eat seafood 2 or more times a week. Have healthy snacks readily available, such as: Vegetable sticks with hummus. Greek yogurt. Fruit and nut trail mix. Eat balanced meals throughout the week. This includes: Fruit: 2-3 servings a day Vegetables: 4-5 servings a day Low-fat dairy: 2 servings a  day Fish, poultry, or lean meat: 1 serving a day Beans and legumes: 2 or more servings a week Nuts and seeds: 1-2 servings a day Whole grains: 6-8 servings a day Extra-virgin olive oil: 3-4 servings a day Limit red meat and sweets to only a few servings a month What are my food choices? Mediterranean diet Recommended Grains: Whole-grain pasta. Brown rice. Bulgar wheat. Polenta. Couscous. Whole-wheat bread. [place] Made[place]. Vegetables: Artichokes. Beets. Broccoli. Cabbage. Carrots. Eggplant. Green  beans. Chard. Kale. Spinach. Onions. Leeks. Peas. Squash. Tomatoes. Peppers. Radishes. Fruits: Apples. Apricots. Avocado. Berries. Bananas. Cherries. Dates. Figs. Grapes. Lemons. Melon. Oranges. Peaches. Plums. Pomegranate. Meats and other protein foods: Beans. Almonds. Sunflower seeds. Pine nuts. Peanuts. Cod. Salmon. Scallops. Shrimp. Tuna. Tilapia. Clams. Oysters. Eggs. Dairy: Low-fat milk. Cheese. Greek yogurt. Beverages: Water. Red wine. Herbal tea. Fats and oils: Extra virgin olive oil. Avocado oil. Grape seed oil. Sweets and desserts: Greek yogurt with honey. Baked apples. Poached pears. Trail mix. Seasoning and other foods: Basil. Cilantro. Coriander. Cumin. Mint. Parsley. Sage. Rosemary. Tarragon. Garlic. Oregano. Thyme. Pepper. Balsalmic vinegar. Tahini. Hummus. Tomato sauce. Olives. Mushrooms. Limit these Grains: Prepackaged pasta or rice dishes. Prepackaged cereal with added sugar. Vegetables: Deep fried potatoes (french fries). Fruits: Fruit canned in syrup. Meats and other protein foods: Beef. Pork. Lamb. Poultry with skin. Hot dogs. [place]. Dairy: Ice cream. Sour cream. Whole milk. Beverages: Juice. Sugar-sweetened soft drinks. Beer. Liquor and spirits. Fats and oils: Butter. Canola oil. Vegetable oil. Beef fat (tallow). Lard. Sweets and desserts: Cookies. Cakes. Pies. Candy. Seasoning and other foods: Mayonnaise. Premade sauces and marinades. The items listed may not be a complete list. Talk with your dietitian about what dietary choices are right for you. Summary The Mediterranean diet includes both food and lifestyle choices. Eat a variety of fresh fruits and vegetables, beans, nuts, seeds, and whole grains. Limit the amount of red meat and sweets that you eat. Talk with your health care provider about whether it is safe for you to drink red wine in moderation. This means 1 glass a day for nonpregnant women and 2 glasses a day for men. A glass of wine equals 5 oz (150  mL). This information is not intended to replace advice given to you by your health care provider. Make sure you discuss any questions you have with your health care provider. Document Released: 03/09/2016 Document Revised: 04/11/2016 Document Reviewed: 03/09/2016 Elsevier Interactive Patient Education  2017 [employer].  We have sent a referral to [facility] for your MRI and they will call you directly to schedule your appointment. They are located at [address] [facility]. If you need to contact them directly please call [phone].  Your provider has requested that you have labwork completed today. Please go to [facility] ([id number]) on the second floor of this building before leaving the office today. You do not need to check in. If you are not called within 15 minutes please check with the front desk.

## 2024-06-18 DIAGNOSIS — E291 Testicular hypofunction: Secondary | ICD-10-CM | POA: Diagnosis not present

## 2024-06-18 DIAGNOSIS — N401 Enlarged prostate with lower urinary tract symptoms: Secondary | ICD-10-CM | POA: Diagnosis not present

## 2024-06-18 DIAGNOSIS — R351 Nocturia: Secondary | ICD-10-CM | POA: Diagnosis not present

## 2024-07-15 DIAGNOSIS — K080 Exfoliation of teeth due to systemic causes: Secondary | ICD-10-CM | POA: Diagnosis not present

## 2024-07-30 ENCOUNTER — Other Ambulatory Visit: Payer: Self-pay | Admitting: Cardiology

## 2024-08-04 ENCOUNTER — Ambulatory Visit (INDEPENDENT_AMBULATORY_CARE_PROVIDER_SITE_OTHER): Admitting: Otolaryngology

## 2024-08-04 ENCOUNTER — Encounter (INDEPENDENT_AMBULATORY_CARE_PROVIDER_SITE_OTHER): Payer: Self-pay | Admitting: Otolaryngology

## 2024-08-04 VITALS — BP 103/65 | HR 62 | Ht 72.0 in | Wt 165.0 lb

## 2024-08-04 DIAGNOSIS — H903 Sensorineural hearing loss, bilateral: Secondary | ICD-10-CM | POA: Diagnosis not present

## 2024-08-04 DIAGNOSIS — H6123 Impacted cerumen, bilateral: Secondary | ICD-10-CM | POA: Diagnosis not present

## 2024-08-04 NOTE — Progress Notes (Signed)
 Patient ID: Nicholas West., male   DOB: 1955-11-18, 69 y.o.   MRN: [id number]  Follow-up: Progressive hearing loss  HPI: The patient is a 69 year old male who returns today for his follow-up evaluation.  He has a history of bilateral progressive high-frequency sensorineural hearing loss.  He was fitted with multiple hearing aids at [facility].  He also has a history of recurrent cerumen impaction, requiring periodic disimpaction.  The patient returns today reporting hearing difficulty, especially in noisy environments.  However, he denies any recent change in his hearing.  He denies any otorrhea or vertigo.  Exam: General: Communicates without difficulty, well nourished, no acute distress. Head: Normocephalic, no evidence injury, no tenderness, facial buttresses intact without stepoff. Face/sinus: No tenderness to palpation and percussion. Facial movement is normal and symmetric. Eyes: PERRL, EOMI. No scleral icterus, conjunctivae clear. Neuro: CN II exam reveals vision grossly intact. No nystagmus at any point of gaze. Auricles: Intact without lesions. EAC: Bilateral cerumen impaction.  Nose: External evaluation reveals normal support and skin without lesions. Dorsum is intact. Anterior rhinoscopy reveals healthy pink mucosa over anterior aspect of inferior turbinates and intact septum. No purulence noted. Oral:  Oral cavity and oropharynx are intact, symmetric, without erythema or edema. Mucosa is moist without lesions. Neck: Full range of motion without pain. There is no significant lymphadenopathy. No masses palpable. Thyroid  bed within normal limits to palpation. Parotid glands and submandibular glands equal bilaterally without mass. Trachea is midline. Neuro:  CN 2-12 grossly intact. Gait normal. Vestibular: No nystagmus at any point of gaze.   Procedure: Bilateral cerumen disimpaction Anesthesia: None Description: Under the operating microscope, the cerumen is carefully removed with a combination of  cerumen currette, alligator forceps, and suction catheters.  After the cerumen is removed, the TMs are noted to be normal.  No mass, erythema, or lesions. The patient tolerated the procedure well.   Assessment:  1.  Bilateral cerumen impaction.  After the disimpaction procedure, his tympanic membrane's and middle ear spaces are noted to be normal. 2.  Subjectively bilateral high-frequency sensorineural hearing loss.  He is currently wearing bilateral hearing aids.  Plan:  1.  Otomicroscopy with bilateral cerumen disimpaction. 2.  The physical exam findings are reviewed with the patient. 3.  Continue the use of his new hearing aids.  Hearing aid adjustment as needed at [facility] audiology department. 4.  The patient will return for reevaluation in 1 year.

## 2024-08-05 DIAGNOSIS — I480 Paroxysmal atrial fibrillation: Secondary | ICD-10-CM

## 2024-08-05 MED ORDER — APIXABAN 5 MG PO TABS: 5.0000 mg | ORAL_TABLET | Freq: Two times a day (BID) | ORAL | 2 refills | Status: AC

## 2024-08-05 NOTE — Telephone Encounter (Signed)
 Eliquis  5mg  refill request received. Patient is 69 years old, weight-74.8kg, Crea-0.89 on 02/15/24, Diagnosis-Afib, and last seen by Dr. Jordan on 06/16/24. Dose is appropriate based on dosing criteria. Will send in refill to requested pharmacy.

## 2024-08-26 ENCOUNTER — Encounter: Payer: Self-pay | Admitting: Family Medicine

## 2024-08-26 DIAGNOSIS — K625 Hemorrhage of anus and rectum: Secondary | ICD-10-CM | POA: Insufficient documentation

## 2024-08-27 ENCOUNTER — Ambulatory Visit: Admitting: Family Medicine

## 2024-08-27 ENCOUNTER — Encounter: Payer: Self-pay | Admitting: Family Medicine

## 2024-08-27 VITALS — BP 116/78 | HR 58 | Temp 97.9°F | Resp 16 | Ht 72.0 in | Wt 165.2 lb

## 2024-08-27 DIAGNOSIS — I480 Paroxysmal atrial fibrillation: Secondary | ICD-10-CM

## 2024-08-27 DIAGNOSIS — R143 Flatulence: Secondary | ICD-10-CM | POA: Diagnosis not present

## 2024-08-27 DIAGNOSIS — J300 Vasomotor rhinitis: Secondary | ICD-10-CM | POA: Diagnosis not present

## 2024-08-27 DIAGNOSIS — Z0000 Encounter for general adult medical examination without abnormal findings: Secondary | ICD-10-CM

## 2024-08-27 DIAGNOSIS — G3184 Mild cognitive impairment, so stated: Secondary | ICD-10-CM

## 2024-08-27 DIAGNOSIS — R6889 Other general symptoms and signs: Secondary | ICD-10-CM | POA: Diagnosis not present

## 2024-08-27 DIAGNOSIS — E7800 Pure hypercholesterolemia, unspecified: Secondary | ICD-10-CM | POA: Diagnosis not present

## 2024-08-27 DIAGNOSIS — I4891 Unspecified atrial fibrillation: Secondary | ICD-10-CM

## 2024-08-27 DIAGNOSIS — I679 Cerebrovascular disease, unspecified: Secondary | ICD-10-CM | POA: Diagnosis not present

## 2024-08-27 LAB — COMPREHENSIVE METABOLIC PANEL WITH GFR
ALT: 32 U/L (ref 3–53)
AST: 33 U/L (ref 5–37)
Albumin: 4.6 g/dL (ref 3.5–5.2)
Alkaline Phosphatase: 49 U/L (ref 39–117)
BUN: 17 mg/dL (ref 6–23)
CO2: 32 meq/L (ref 19–32)
Calcium: 9.3 mg/dL (ref 8.4–10.5)
Chloride: 103 meq/L (ref 96–112)
Creatinine, Ser: 0.84 mg/dL (ref 0.40–1.50)
GFR: 88.6 mL/min
Glucose, Bld: 82 mg/dL (ref 70–99)
Potassium: 4.2 meq/L (ref 3.5–5.1)
Sodium: 139 meq/L (ref 135–145)
Total Bilirubin: 0.8 mg/dL (ref 0.2–1.2)
Total Protein: 7.4 g/dL (ref 6.0–8.3)

## 2024-08-27 LAB — CBC WITH DIFFERENTIAL/PLATELET
Basophils Absolute: 0 10*3/uL (ref 0.0–0.1)
Basophils Relative: 0.3 % (ref 0.0–3.0)
Eosinophils Absolute: 0 10*3/uL (ref 0.0–0.7)
Eosinophils Relative: 0.8 % (ref 0.0–5.0)
HCT: 48.7 % (ref 39.0–52.0)
Hemoglobin: 16.8 g/dL (ref 13.0–17.0)
Lymphocytes Relative: 20.2 % (ref 12.0–46.0)
Lymphs Abs: 1.2 10*3/uL (ref 0.7–4.0)
MCHC: 34.6 g/dL (ref 30.0–36.0)
MCV: 91.7 fl (ref 78.0–100.0)
Monocytes Absolute: 0.6 10*3/uL (ref 0.1–1.0)
Monocytes Relative: 10.8 % (ref 3.0–12.0)
Neutro Abs: 3.9 10*3/uL (ref 1.4–7.7)
Neutrophils Relative %: 67.9 % (ref 43.0–77.0)
Platelets: 147 10*3/uL — ABNORMAL LOW (ref 150.0–400.0)
RBC: 5.31 Mil/uL (ref 4.22–5.81)
RDW: 13.2 % (ref 11.5–15.5)
WBC: 5.8 10*3/uL (ref 4.0–10.5)

## 2024-08-27 LAB — LIPID PANEL
Cholesterol: 108 mg/dL (ref 28–200)
HDL: 45 mg/dL
LDL Cholesterol: 52 mg/dL (ref 10–99)
NonHDL: 63.02
Total CHOL/HDL Ratio: 2
Triglycerides: 54 mg/dL (ref 10.0–149.0)
VLDL: 10.8 mg/dL (ref 0.0–40.0)

## 2024-08-27 LAB — TSH: TSH: 2.09 u[IU]/mL (ref 0.35–5.50)

## 2024-08-27 NOTE — Patient Instructions (Signed)
 Thanks for coming in today.  See information below on foods that can increase gas or bloating (FODMAPs)  If that does not improve I do recommend following up with your gastroenterologist.  Try adding back Flonase  1 spray in each nostril per day, up to 2 sprays/day along with Astelin  nasal spray to see if that helps with the runny nose.  If not helping, recommend discussing further with your ear nose and throat specialist. If any concerns on labs I will let you know.  No med changes at this time.  Follow-up in 6 months.  Take care!  Low-FODMAP Eating Plan  FODMAP stands for fermentable oligosaccharides, disaccharides, monosaccharides, and polyols. These are sugars that are hard for some people to digest. A low-FODMAP eating plan may help some people who have irritable bowel syndrome (IBS) and certain other bowel (intestinal) diseases to manage their symptoms. This meal plan can be complicated to follow. Work with a diet and nutrition specialist (dietitian) to make a low-FODMAP eating plan that is right for you. A dietitian can help make sure that you get enough nutrition from this diet. What are tips for following this plan? Reading food labels Check labels for hidden FODMAPs such as: High-fructose syrup. Honey. Agave. Natural fruit flavors. Onion or garlic powder. Choose low-FODMAP foods that contain 3-4 grams of fiber per serving. Check food labels for serving sizes. Eat only one serving at a time to make sure FODMAP levels stay low. Shopping Shop with a list of foods that are recommended on this diet and make a meal plan. Meal planning Follow a low-FODMAP eating plan for up to 6 weeks, or as told by your health care provider or dietitian. To follow the eating plan: Eliminate high-FODMAP foods from your diet completely. Choose only low-FODMAP foods to eat. You will do this for 2-6 weeks. Gradually reintroduce high-FODMAP foods into your diet one at a time. Most people should wait a few days  before introducing the next new high-FODMAP food into their meal plan. Your dietitian can recommend how quickly you may reintroduce foods. Keep a daily record of what and how much you eat and drink. Make note of any symptoms that you have after eating. Review your daily record with a dietitian regularly to identify which foods you can eat and which foods you should avoid. General tips Drink enough fluid each day to keep your urine pale yellow. Avoid processed foods. These often have added sugar and may be high in FODMAPs. Avoid most dairy products, whole grains, and sweeteners. Work with a dietitian to make sure you get enough fiber in your diet. Avoid high FODMAP foods at meals to manage symptoms. Recommended foods Fruits Bananas, oranges, tangerines, lemons, limes, blueberries, raspberries, strawberries, grapes, cantaloupe, honeydew melon, kiwi, papaya, passion fruit, and pineapple. Limited amounts of dried cranberries, banana chips, and shredded coconut. Vegetables Eggplant, zucchini, cucumber, peppers, green beans, bean sprouts, lettuce, arugula, kale, Swiss chard, spinach, collard greens, bok choy, summer squash, potato, and tomato. Limited amounts of corn, carrot, and sweet potato. Green parts of scallions. Grains Gluten-free grains, such as rice, oats, buckwheat, quinoa, corn, polenta, and millet. Gluten-free pasta, bread, or cereal. Rice noodles. Corn tortillas. Meats and other proteins Unseasoned beef, pork, poultry, or fish. Eggs. [place]. Tofu (firm) and tempeh. Limited amounts of nuts and seeds, such as almonds, walnuts, brazil nuts, pecans, peanuts, nut butters, pumpkin seeds, chia seeds, and sunflower seeds. Dairy Lactose-free milk, yogurt, and kefir. Lactose-free cottage cheese and ice cream. Non-dairy milks, such  as almond, coconut, hemp, and rice milk. Non-dairy yogurt. Limited amounts of goat cheese, brie, mozzarella, parmesan, swiss, and other hard cheeses. Fats and  oils Butter-free spreads. Vegetable oils, such as olive, canola, and sunflower oil. Seasoning and other foods Artificial sweeteners with names that do not end in ol, such as aspartame, saccharine, and stevia. Maple syrup, white table sugar, raw sugar, brown sugar, and molasses. Mayonnaise, soy sauce, and tamari. Fresh basil, coriander, parsley, rosemary, and thyme. Beverages Water and mineral water. Sugar-sweetened soft drinks. Small amounts of orange juice or cranberry juice. Black and green tea. Most dry wines. Coffee. The items listed above may not be a complete list of foods and beverages you can eat. Contact a dietitian for more information. Foods to avoid Fruits Fresh, dried, and juiced forms of apple, pear, watermelon, peach, plum, cherries, apricots, blackberries, boysenberries, figs, nectarines, and mango. Avocado. Vegetables Chicory root, artichoke, asparagus, cabbage, snow peas, Brussels sprouts, broccoli, sugar snap peas, mushrooms, celery, and cauliflower. Onions, garlic, leeks, and the white part of scallions. Grains Wheat, including kamut, durum, and semolina. Barley and bulgur. Couscous. Wheat-based cereals. Wheat noodles, bread, crackers, and pastries. Meats and other proteins Fried or fatty meat. Sausage. Cashews and pistachios. Soybeans, baked beans, black beans, chickpeas, kidney beans, fava beans, navy beans, lentils, black-eyed peas, and split peas. Dairy Milk, yogurt, ice cream, and soft cheese. Cream and sour cream. Milk-based sauces. Custard. Buttermilk. Soy milk. Seasoning and other foods Any sugar-free gum or candy. Foods that contain artificial sweeteners such as sorbitol, mannitol, isomalt, or xylitol. Foods that contain honey, high-fructose corn syrup, or agave. Bouillon, vegetable stock, beef stock, and chicken stock. Garlic and onion powder. Condiments made with onion, such as hummus, chutney, pickles, relish, salad dressing, and salsa. Tomato  paste. Beverages Chicory-based drinks. Coffee substitutes. Chamomile tea. Fennel tea. Sweet or fortified wines such as port or sherry. Diet soft drinks made with isomalt, mannitol, maltitol, sorbitol, or xylitol. Apple, pear, and mango juice. Juices with high-fructose corn syrup. The items listed above may not be a complete list of foods and beverages you should avoid. Contact a dietitian for more information. Summary FODMAP stands for fermentable oligosaccharides, disaccharides, monosaccharides, and polyols. These are sugars that are hard for some people to digest. A low-FODMAP eating plan is a short-term diet that helps to ease symptoms of certain bowel diseases. The eating plan usually lasts up to 6 weeks. After that, high-FODMAP foods are reintroduced gradually and one at a time. This can help you find out which foods may be causing symptoms. A low-FODMAP eating plan can be complicated. It is best to work with a dietitian who has experience with this type of plan. This information is not intended to replace advice given to you by your health care provider. Make sure you discuss any questions you have with your health care provider. Document Revised: 07/01/2023 Document Reviewed: 07/01/2023 Elsevier Patient Education  2025 [employer].  Preventive Care 65 Years and Older, Male Preventive care refers to lifestyle choices and visits with your health care provider that can promote health and wellness. Preventive care visits are also called wellness exams. What can I expect for my preventive care visit? Counseling During your preventive care visit, your health care provider may ask about your: Medical history, including: Past medical problems. Family medical history. History of falls. Current health, including: Emotional well-being. Home life and relationship well-being. Sexual activity. Memory and ability to understand (cognition). Lifestyle, including: Alcohol, nicotine or tobacco, and  drug use.  Access to firearms. Diet, exercise, and sleep habits. Work and work astronomer. Sunscreen use. Safety issues such as seatbelt and bike helmet use. Physical exam Your health care provider will check your: Height and weight. These may be used to calculate your BMI (body mass index). BMI is a measurement that tells if you are at a healthy weight. Waist circumference. This measures the distance around your waistline. This measurement also tells if you are at a healthy weight and may help predict your risk of certain diseases, such as type 2 diabetes and high blood pressure. Heart rate and blood pressure. Body temperature. Skin for abnormal spots. What immunizations do I need?  Vaccines are usually given at various ages, according to a schedule. Your health care provider will recommend vaccines for you based on your age, medical history, and lifestyle or other factors, such as travel or where you work. What tests do I need? Screening Your health care provider may recommend screening tests for certain conditions. This may include: Lipid and cholesterol levels. Diabetes screening. This is done by checking your blood sugar (glucose) after you have not eaten for a while (fasting). Hepatitis C test. Hepatitis B test. HIV (human immunodeficiency virus) test. STI (sexually transmitted infection) testing, if you are at risk. Lung cancer screening. Colorectal cancer screening. Prostate cancer screening. Abdominal aortic aneurysm (AAA) screening. You may need this if you are a current or former smoker. Talk with your health care provider about your test results, treatment options, and if necessary, the need for more tests. Follow these instructions at home: Eating and drinking  Eat a diet that includes fresh fruits and vegetables, whole grains, lean protein, and low-fat dairy products. Limit your intake of foods with high amounts of sugar, saturated fats, and salt. Take vitamin and  mineral supplements as recommended by your health care provider. Do not drink alcohol if your health care provider tells you not to drink. If you drink alcohol: Limit how much you have to 0-2 drinks a day. Know how much alcohol is in your drink. In the [place] one drink equals one 12 oz bottle of beer (355 mL), one 5 oz glass of wine (148 mL), or one 1 oz glass of hard liquor (44 mL). Lifestyle Brush your teeth every morning and night with fluoride toothpaste. Floss one time each day. Exercise for at least 30 minutes 5 or more days each week. Do not use any products that contain nicotine or tobacco. These products include cigarettes, chewing tobacco, and vaping devices, such as e-cigarettes. If you need help quitting, ask your health care provider. Do not use drugs. If you are sexually active, practice safe sex. Use a condom or other form of protection to prevent STIs. Take aspirin only as told by your health care provider. Make sure that you understand how much to take and what form to take. Work with your health care provider to find out whether it is safe and beneficial for you to take aspirin daily. Ask your health care provider if you need to take a cholesterol-lowering medicine (statin). Find healthy ways to manage stress, such as: Meditation, yoga, or listening to music. Journaling. Talking to a trusted person. Spending time with friends and family. Safety Always wear your seat belt while driving or riding in a vehicle. Do not drive: If you have been drinking alcohol. Do not ride with someone who has been drinking. When you are tired or distracted. While texting. If you have been using any mind-altering substances or  drugs. Wear a helmet and other protective equipment during sports activities. If you have firearms in your house, make sure you follow all gun safety procedures. Minimize exposure to UV radiation to reduce your risk of skin cancer. What's next? Visit your health care  provider once a year for an annual wellness visit. Ask your health care provider how often you should have your eyes and teeth checked. Stay up to date on all vaccines. This information is not intended to replace advice given to you by your health care provider. Make sure you discuss any questions you have with your health care provider. Document Revised: 01/12/2021 Document Reviewed: 01/12/2021 Elsevier Patient Education  2024 [employer].

## 2024-08-29 ENCOUNTER — Ambulatory Visit: Payer: Self-pay | Admitting: Family Medicine

## 2024-09-05 NOTE — Progress Notes (Signed)
 Called patient in regards to lab results. [facility]. If patient calls back, okay to relay lab results

## 2024-09-29 ENCOUNTER — Other Ambulatory Visit

## 2024-10-14 ENCOUNTER — Encounter

## 2024-11-17 ENCOUNTER — Ambulatory Visit: Payer: Self-pay

## 2024-11-17 ENCOUNTER — Institutional Professional Consult (permissible substitution): Admitting: Psychology

## 2024-11-24 ENCOUNTER — Encounter: Admitting: Psychology

## 2025-01-06 ENCOUNTER — Ambulatory Visit: Admitting: Physician Assistant

## 2025-02-11 ENCOUNTER — Ambulatory Visit (INDEPENDENT_AMBULATORY_CARE_PROVIDER_SITE_OTHER): Admitting: Otolaryngology

## 2025-02-23 ENCOUNTER — Ambulatory Visit: Admitting: Family Medicine
